# Patient Record
Sex: Female | Born: 1970 | Race: Black or African American | Hispanic: Refuse to answer | Smoking: Former smoker
Health system: Southern US, Community
[De-identification: ages and names within clinical notes are randomized; demographics above are authoritative.]

## PROBLEM LIST (undated history)

## (undated) DIAGNOSIS — R569 Unspecified convulsions: Secondary | ICD-10-CM

## (undated) DIAGNOSIS — D649 Anemia, unspecified: Secondary | ICD-10-CM

## (undated) DIAGNOSIS — D219 Benign neoplasm of connective and other soft tissue, unspecified: Secondary | ICD-10-CM

## (undated) DIAGNOSIS — E785 Hyperlipidemia, unspecified: Secondary | ICD-10-CM

## (undated) DIAGNOSIS — G40909 Epilepsy, unspecified, not intractable, without status epilepticus: Secondary | ICD-10-CM

## (undated) DIAGNOSIS — I639 Cerebral infarction, unspecified: Secondary | ICD-10-CM

## (undated) DIAGNOSIS — Z8619 Personal history of other infectious and parasitic diseases: Secondary | ICD-10-CM

## (undated) DIAGNOSIS — I10 Essential (primary) hypertension: Secondary | ICD-10-CM

## (undated) HISTORY — PX: WISDOM TOOTH EXTRACTION: SHX21

## (undated) HISTORY — DX: Anemia, unspecified: D64.9

## (undated) HISTORY — DX: Unspecified convulsions: R56.9

## (undated) HISTORY — PX: TUBAL LIGATION: SHX77

## (undated) HISTORY — DX: Hyperlipidemia, unspecified: E78.5

## (undated) HISTORY — DX: Personal history of other infectious and parasitic diseases: Z86.19

## (undated) HISTORY — DX: Benign neoplasm of connective and other soft tissue, unspecified: D21.9

## (undated) HISTORY — DX: Epilepsy, unspecified, not intractable, without status epilepticus: G40.909

---

## 1997-11-01 ENCOUNTER — Emergency Department (HOSPITAL_COMMUNITY): Admission: EM | Admit: 1997-11-01 | Discharge: 1997-11-01 | Payer: Self-pay | Admitting: Emergency Medicine

## 1997-11-04 ENCOUNTER — Other Ambulatory Visit: Admission: RE | Admit: 1997-11-04 | Discharge: 1997-11-04 | Payer: Self-pay | Admitting: Family Medicine

## 1997-11-21 ENCOUNTER — Other Ambulatory Visit: Admission: RE | Admit: 1997-11-21 | Discharge: 1997-11-21 | Payer: Self-pay | Admitting: Family Medicine

## 1999-09-08 ENCOUNTER — Other Ambulatory Visit: Admission: RE | Admit: 1999-09-08 | Discharge: 1999-09-08 | Payer: Self-pay | Admitting: *Deleted

## 1999-11-12 ENCOUNTER — Emergency Department (HOSPITAL_COMMUNITY): Admission: EM | Admit: 1999-11-12 | Discharge: 1999-11-12 | Payer: Self-pay | Admitting: *Deleted

## 1999-11-12 ENCOUNTER — Encounter: Payer: Self-pay | Admitting: Emergency Medicine

## 2000-12-11 ENCOUNTER — Encounter: Payer: Self-pay | Admitting: Emergency Medicine

## 2000-12-11 ENCOUNTER — Encounter: Payer: Self-pay | Admitting: Neurology

## 2000-12-11 ENCOUNTER — Inpatient Hospital Stay (HOSPITAL_COMMUNITY): Admission: EM | Admit: 2000-12-11 | Discharge: 2000-12-12 | Payer: Self-pay | Admitting: Emergency Medicine

## 2000-12-12 ENCOUNTER — Encounter: Payer: Self-pay | Admitting: Neurology

## 2001-01-19 ENCOUNTER — Other Ambulatory Visit: Admission: RE | Admit: 2001-01-19 | Discharge: 2001-01-19 | Payer: Self-pay | Admitting: Family Medicine

## 2001-02-20 ENCOUNTER — Ambulatory Visit (HOSPITAL_COMMUNITY): Admission: RE | Admit: 2001-02-20 | Discharge: 2001-02-20 | Payer: Self-pay | Admitting: Family Medicine

## 2001-02-20 ENCOUNTER — Encounter: Payer: Self-pay | Admitting: Family Medicine

## 2001-05-02 ENCOUNTER — Ambulatory Visit (HOSPITAL_COMMUNITY): Admission: RE | Admit: 2001-05-02 | Discharge: 2001-05-02 | Payer: Self-pay | Admitting: Family Medicine

## 2002-12-18 ENCOUNTER — Other Ambulatory Visit: Admission: RE | Admit: 2002-12-18 | Discharge: 2002-12-18 | Payer: Self-pay | Admitting: Obstetrics & Gynecology

## 2003-02-16 ENCOUNTER — Emergency Department (HOSPITAL_COMMUNITY): Admission: EM | Admit: 2003-02-16 | Discharge: 2003-02-16 | Payer: Self-pay | Admitting: Emergency Medicine

## 2003-07-26 ENCOUNTER — Emergency Department (HOSPITAL_COMMUNITY): Admission: EM | Admit: 2003-07-26 | Discharge: 2003-07-26 | Payer: Self-pay | Admitting: Emergency Medicine

## 2003-11-20 ENCOUNTER — Emergency Department (HOSPITAL_COMMUNITY): Admission: EM | Admit: 2003-11-20 | Discharge: 2003-11-20 | Payer: Self-pay | Admitting: Family Medicine

## 2004-01-15 ENCOUNTER — Emergency Department (HOSPITAL_COMMUNITY): Admission: EM | Admit: 2004-01-15 | Discharge: 2004-01-15 | Payer: Self-pay | Admitting: Family Medicine

## 2004-04-10 ENCOUNTER — Emergency Department (HOSPITAL_COMMUNITY): Admission: EM | Admit: 2004-04-10 | Discharge: 2004-04-10 | Payer: Self-pay | Admitting: Family Medicine

## 2004-07-13 ENCOUNTER — Emergency Department (HOSPITAL_COMMUNITY): Admission: EM | Admit: 2004-07-13 | Discharge: 2004-07-14 | Payer: Self-pay | Admitting: Emergency Medicine

## 2004-07-15 ENCOUNTER — Emergency Department (HOSPITAL_COMMUNITY): Admission: EM | Admit: 2004-07-15 | Discharge: 2004-07-15 | Payer: Self-pay | Admitting: Emergency Medicine

## 2004-10-29 ENCOUNTER — Emergency Department (HOSPITAL_COMMUNITY): Admission: EM | Admit: 2004-10-29 | Discharge: 2004-10-29 | Payer: Self-pay | Admitting: Family Medicine

## 2004-11-01 ENCOUNTER — Emergency Department (HOSPITAL_COMMUNITY): Admission: EM | Admit: 2004-11-01 | Discharge: 2004-11-01 | Payer: Self-pay | Admitting: Family Medicine

## 2005-02-26 ENCOUNTER — Emergency Department (HOSPITAL_COMMUNITY): Admission: EM | Admit: 2005-02-26 | Discharge: 2005-02-26 | Payer: Self-pay | Admitting: Family Medicine

## 2005-08-08 ENCOUNTER — Emergency Department (HOSPITAL_COMMUNITY): Admission: EM | Admit: 2005-08-08 | Discharge: 2005-08-08 | Payer: Self-pay | Admitting: Family Medicine

## 2005-10-05 ENCOUNTER — Emergency Department (HOSPITAL_COMMUNITY): Admission: EM | Admit: 2005-10-05 | Discharge: 2005-10-05 | Payer: Self-pay | Admitting: Family Medicine

## 2006-03-01 ENCOUNTER — Emergency Department (HOSPITAL_COMMUNITY): Admission: EM | Admit: 2006-03-01 | Discharge: 2006-03-01 | Payer: Self-pay | Admitting: Emergency Medicine

## 2006-07-13 ENCOUNTER — Emergency Department (HOSPITAL_COMMUNITY): Admission: EM | Admit: 2006-07-13 | Discharge: 2006-07-13 | Payer: Self-pay | Admitting: Emergency Medicine

## 2006-11-01 ENCOUNTER — Emergency Department (HOSPITAL_COMMUNITY): Admission: EM | Admit: 2006-11-01 | Discharge: 2006-11-01 | Payer: Self-pay | Admitting: Family Medicine

## 2006-11-03 ENCOUNTER — Emergency Department (HOSPITAL_COMMUNITY): Admission: EM | Admit: 2006-11-03 | Discharge: 2006-11-03 | Payer: Self-pay | Admitting: Family Medicine

## 2007-01-17 ENCOUNTER — Emergency Department (HOSPITAL_COMMUNITY): Admission: EM | Admit: 2007-01-17 | Discharge: 2007-01-17 | Payer: Self-pay | Admitting: Emergency Medicine

## 2007-02-09 ENCOUNTER — Emergency Department (HOSPITAL_COMMUNITY): Admission: EM | Admit: 2007-02-09 | Discharge: 2007-02-09 | Payer: Self-pay | Admitting: Family Medicine

## 2008-01-22 ENCOUNTER — Inpatient Hospital Stay (HOSPITAL_COMMUNITY): Admission: EM | Admit: 2008-01-22 | Discharge: 2008-01-24 | Payer: Self-pay | Admitting: Emergency Medicine

## 2009-07-16 ENCOUNTER — Emergency Department (HOSPITAL_COMMUNITY): Admission: EM | Admit: 2009-07-16 | Discharge: 2009-07-16 | Payer: Self-pay | Admitting: Emergency Medicine

## 2010-03-18 ENCOUNTER — Emergency Department (HOSPITAL_COMMUNITY)
Admission: EM | Admit: 2010-03-18 | Discharge: 2010-03-18 | Payer: Self-pay | Source: Home / Self Care | Admitting: Emergency Medicine

## 2010-05-27 ENCOUNTER — Ambulatory Visit (HOSPITAL_COMMUNITY)
Admission: RE | Admit: 2010-05-27 | Discharge: 2010-05-27 | Payer: Self-pay | Source: Home / Self Care | Attending: Internal Medicine | Admitting: Internal Medicine

## 2010-07-09 ENCOUNTER — Encounter (HOSPITAL_COMMUNITY): Admission: RE | Admit: 2010-07-09 | Payer: Self-pay | Source: Home / Self Care | Admitting: Internal Medicine

## 2010-07-15 ENCOUNTER — Encounter: Payer: Self-pay | Admitting: Internal Medicine

## 2010-10-30 NOTE — H&P (Signed)
[place]. [facility]  Patient:    Tina Moss, Tina Moss                       MRN: [id number] Adm. Date:  [id number] Attending:  Molpus, John L                         History and Physical  DATE OF BIRTH:  1971-06-08  CHIEF COMPLAINT:  Altered level of consciousness.  HISTORY OF PRESENT ILLNESS:  This is the first [facility] system admission for this 40 year old woman with a reported past medical history of seizures who was brought to the emergency room with a decreased level of consciousness.  There is no history available except what is per the chart. The patient was apparently drinking fairly heavily tonight and has been under increased emotional stress lately.  She laid down on the couch, and then per familys report to EMS became less responsive rather suddenly.  There was no clear seizure activity per the family.  EMS notes the patient was arousable somewhat to ammonia capsule.  She was brought to the emergency room where she was noted to have disconjugate gaze.  Presently, the patient is minimally responsive but is able to answer simple questions.  She denies any problems.  PAST MEDICAL HISTORY: 1. Remarkable for questionable seizures as above.  She apparently is on    Dilantin but does not really take it. 2. No other known medical history.  FAMILY HISTORY:  Unknown.  SOCIAL HISTORY:  Alcohol consumption as above; it is unclear how much her chronic use is.  ALLERGIES:  None known.  MEDICATIONS:  Possibly Dilantin.  REVIEW OF SYSTEMS:  Unavailable secondary to the patients mental status. She reportedly complained of a headache earlier.  PHYSICAL EXAMINATION: VITAL SIGNS:  Temperature 97.1, blood pressure 112/64, pulse 87, respirations 24, O2 saturation 99% on two liters of oxygen by nasal cannula.  GENERAL/MENTAL STATUS:  She is obtunded and briefly arouses to vigorous, verbal and painful stimuli and then quickly falls asleep again.   She is oriented to a hospital in [place].  She identifies the year as 2002 but cannot name the month.  She is able to follow simple commands.  HEAD:  Cranium is normocephalic and atraumatic.  Oropharynx is benign.  There is no tongue trauma.  NECK:  Supple without bruit.  HEART:  Regular rate and rhythm without murmurs.  CHEST:  Clear to auscultation.  ABDOMEN:  Soft with normoactive bowel sounds.  EXTREMITIES:  No edema.  NEUROLOGICAL:  Pupils are equal and briskly reactive.  Her eyes are disconjugate and she appears to have a left exotropia.  It is hard to examine her extraocular movements due to poor cooperativity.  She has corneal and gag reflexes and the face is symmetric.  Sensory/tone:  Tone is normal.  She is able to withdraw from pain in all extremities.  Reflexes are slightly brisk on the left compared to the right.  Toes are downgoing.  LABORATORY DATA:  CBC and iSTAT are normal.  Urine drug screen is negative. Urinalysis reveals a UTI.  Alcohol level is 201.  A CT of the head is personally reviewed and reveals scattered hypodensities bilaterally in the deep white matter.  This looked mostly like MS lesions, old strokes or another possibility less likely are abscesses and metastatic.  IMPRESSION: 1. Altered level of consciousness probably due to alcohol intoxication.  Rule  out postictal state or other phenomenon. 2. Alcohol intoxication. 3. Abnormal CAT scan, etiology unclear. 4. Urinary tract infection.  PLAN: 1. Admit for observation. 2. MRI with contrast. 3. IV fluids and supportive care. 4. Treatment for UTI. 5. May need to check EEG. DD:  12/11/00 TD:  12/11/00 Job: [id number]

## 2011-03-12 LAB — CBC
HCT: 31.6 — ABNORMAL LOW
MCHC: 31.9
MCV: 80.1
Platelets: 238
RBC: 3.95
RDW: 20.1 — ABNORMAL HIGH

## 2011-03-12 LAB — DIFFERENTIAL
Basophils Absolute: 0
Lymphocytes Relative: 12
Lymphs Abs: 1
Neutro Abs: 6.2

## 2011-03-12 LAB — BASIC METABOLIC PANEL
BUN: 9
CO2: 26
Calcium: 8.7
Creatinine, Ser: 0.52
GFR calc Af Amer: >60
GFR calc non Af Amer: >60
Glucose, Bld: 93

## 2011-03-12 LAB — URINALYSIS, ROUTINE W REFLEX MICROSCOPIC
Bilirubin Urine: NEGATIVE
Glucose, UA: NEGATIVE
Hgb urine dipstick: NEGATIVE
Nitrite: NEGATIVE
Protein, ur: NEGATIVE
Specific Gravity, Urine: 1.029

## 2011-03-12 LAB — WET PREP, GENITAL
Clue Cells Wet Prep HPF POC: NONE SEEN
Trich, Wet Prep: NONE SEEN

## 2011-03-29 LAB — I-STAT 8, (EC8 V) (CONVERTED LAB)
Bicarbonate: 24.1 — ABNORMAL HIGH
Glucose, Bld: 90
Hemoglobin: 12.2
Operator id: 116391
Potassium: 3.6
Sodium: 140
TCO2: 25
pCO2, Ven: 46.5
pH, Ven: 7.322 — ABNORMAL HIGH

## 2011-03-29 LAB — POCT URINALYSIS DIP (DEVICE)
Bilirubin Urine: NEGATIVE
Glucose, UA: NEGATIVE
Ketones, ur: NEGATIVE
Nitrite: NEGATIVE
Operator id: 116391
Specific Gravity, Urine: 1.025
Urobilinogen, UA: 0.2
pH: 6

## 2011-03-29 LAB — WET PREP, GENITAL
Clue Cells Wet Prep HPF POC: NONE SEEN
Trich, Wet Prep: NONE SEEN
Yeast Wet Prep HPF POC: NONE SEEN

## 2011-03-29 LAB — POCT PREGNANCY, URINE
Operator id: 235561
Preg Test, Ur: NEGATIVE

## 2011-03-29 LAB — POCT I-STAT CREATININE
Creatinine, Ser: 0.6
Operator id: 116391

## 2011-03-29 LAB — GC/CHLAMYDIA PROBE AMP, GENITAL
Chlamydia, DNA Probe: NEGATIVE
GC Probe Amp, Genital: NEGATIVE

## 2011-06-21 ENCOUNTER — Ambulatory Visit: Payer: 59

## 2011-06-21 DIAGNOSIS — J069 Acute upper respiratory infection, unspecified: Secondary | ICD-10-CM

## 2011-06-21 DIAGNOSIS — D649 Anemia, unspecified: Secondary | ICD-10-CM

## 2011-06-21 DIAGNOSIS — R1084 Generalized abdominal pain: Secondary | ICD-10-CM

## 2011-11-04 ENCOUNTER — Ambulatory Visit (INDEPENDENT_AMBULATORY_CARE_PROVIDER_SITE_OTHER): Payer: 59 | Admitting: Family Medicine

## 2011-11-04 VITALS — BP 120/80 | HR 92 | Temp 97.9°F | Resp 16 | Ht 68.0 in | Wt 175.0 lb

## 2011-11-04 DIAGNOSIS — Q043 Other reduction deformities of brain: Secondary | ICD-10-CM

## 2011-11-04 DIAGNOSIS — M545 Low back pain: Secondary | ICD-10-CM

## 2011-11-04 DIAGNOSIS — R350 Frequency of micturition: Secondary | ICD-10-CM

## 2011-11-04 DIAGNOSIS — R300 Dysuria: Secondary | ICD-10-CM

## 2011-11-04 LAB — POCT UA - MICROSCOPIC ONLY: Casts, Ur, LPF, POC: NEGATIVE

## 2011-11-04 LAB — POCT URINALYSIS DIPSTICK
Blood, UA: NEGATIVE
Glucose, UA: NEGATIVE
Ketones, UA: 15
Nitrite, UA: NEGATIVE

## 2011-11-04 MED ORDER — MELOXICAM 7.5 MG PO TABS: ORAL_TABLET | ORAL | Status: DC

## 2011-11-04 MED ORDER — CYCLOBENZAPRINE HCL 10 MG PO TABS: 10.0000 mg | ORAL_TABLET | Freq: Every evening | ORAL | Status: AC | PRN

## 2011-11-04 NOTE — Progress Notes (Signed)
  Subjective:    Patient ID: Tina Moss, female    DOB: [birth year], 41 y.o.   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 Pain with ROM. Denies radiation of pain to LE Denies weakness to LE Denies loss of bowel or bladder control.  Tobacco 2 packs a week.  Review of Systems     Objective:   Physical Exam  Constitutional: She appears well-developed.  Neck: Neck supple.  Cardiovascular: Normal rate, regular rhythm and normal heart sounds.   Pulmonary/Chest: Effort normal and breath sounds normal.  Musculoskeletal:       Lumbar back: She exhibits tenderness and spasm. She exhibits normal range of motion, no bony tenderness, no deformity and no pain.       Back:  Neurological: She is alert. She has normal strength. No sensory deficit.  Reflex Scores:      Patellar reflexes are 2+ on the right side and 2+ on the left side.      Negative SLR        Assessment & Plan:  LBP; musculoskeletal  See medications on AVS Back booklet provided Work restrictions provided(see scanned return to work note) Work excuse provided for today. Patient encouraged to speak with her supervisor regarding this injury

## 2012-01-11 ENCOUNTER — Encounter: Payer: Self-pay | Admitting: Obstetrics and Gynecology

## 2012-01-18 ENCOUNTER — Encounter: Payer: Self-pay | Admitting: Obstetrics and Gynecology

## 2012-01-18 ENCOUNTER — Ambulatory Visit (INDEPENDENT_AMBULATORY_CARE_PROVIDER_SITE_OTHER): Payer: 59 | Admitting: Obstetrics and Gynecology

## 2012-01-18 VITALS — BP 122/64 | Ht 67.0 in | Wt 169.0 lb

## 2012-01-18 DIAGNOSIS — G40909 Epilepsy, unspecified, not intractable, without status epilepticus: Secondary | ICD-10-CM

## 2012-01-18 DIAGNOSIS — N946 Dysmenorrhea, unspecified: Secondary | ICD-10-CM

## 2012-01-18 DIAGNOSIS — D219 Benign neoplasm of connective and other soft tissue, unspecified: Secondary | ICD-10-CM | POA: Insufficient documentation

## 2012-01-18 DIAGNOSIS — D649 Anemia, unspecified: Secondary | ICD-10-CM

## 2012-01-18 DIAGNOSIS — N920 Excessive and frequent menstruation with regular cycle: Secondary | ICD-10-CM

## 2012-01-18 DIAGNOSIS — D259 Leiomyoma of uterus, unspecified: Secondary | ICD-10-CM

## 2012-01-18 DIAGNOSIS — R569 Unspecified convulsions: Secondary | ICD-10-CM

## 2012-01-18 NOTE — Progress Notes (Signed)
When did bleeding start: 12/20/2011 How  Long: 5-7 days and very heavy  How often changing pad/tampon: 6 times in a work day 5am to 2 pm Bleeding Disorders: yes Cramping: yes Contraception: no Fibroids: yes history  Hormone Therapy: no New Medications: no Menopausal Symptoms: yes Vag. Discharge: yes Abdominal Pain: yes Increased Stress: yes Pt stated had her pap last year in 2012 wnl   NEW GYNECOLOGIC EXAMINATION  Tina Moss is an 41 y.o. female, [id number], who presents to the [facility] division of [facility] for a new patient gynecologic examination. The patient has a history of fibroids, menorrhagia, dysmenorrhea,and anemia.  At time she has difficulty working. She has had a tubal ligation.  She has had 2 cesarean sections.  Her complains are:she does feel fatigued.  She has romance concerns. .     Pertinent Gynecological History: Patient's last menstrual period was 12/20/2011. Menses: heavy with cramping. Menarche: 10 Contraception: tubal ligation DES exposure: unknown Blood transfusions: none Sexually transmitted diseases: The patient denies history of sexually transmitted disease. Previous GYN Procedures: cesarean section and tubal ligation  Last pap: normal Date: 2012 History of Abnormal Pap Smears:  No  Obstetrical History:  Vaginal Deliveries at Term:      0 Preterm Vaginal Deliveries:      0 Cesarean Deliveries at Term:  2 Preterm Cesareans:                 0 Miscarriages:                            1 Abortions:                                  0    Past Medical History  Diagnosis Date  . Epilepsy   . H/O blood clots   . Seizures   . H/O mumps   . H/O bacterial infection   . Anemia   . Fibroids    The patient reports that she had seizures as a child.  Her last seizure was 4 years ago.  She is not taking medication.  She does not have a physician.  Past Surgical History  Procedure Date  . Wisdom tooth extraction     . Cesarean section   . Btl     Family history:   Social History:  reports that she has been smoking Cigars.  She has never used smokeless tobacco. She reports that she drinks alcohol. She reports that she does not use illicit drugs.  Allergies: No Known Allergies  Medications: none  Review of Systems:  See history of present illness and gynecologic history.  Family History:   The patient has a family history of high blood pressure and headaches  Physical Examination:  Blood pressure 122/64, height 5\' 7"  (1.702 m), weight 169 lb (76.658 kg), last menstrual period 12/20/2011. Body mass index is 26.47 kg/(m^2).  General: alert, cooperative, no distress and Poor dentition. Resp: clear to auscultation bilaterally Cardio: regular rate and rhythm, S1, S2 normal, no murmur, click, rub or gallop GI: soft and nontender normal appearance, no masses or tenderness  External genitalia: normal general appearance Vaginal: normal without tenderness, induration or masses and relaxation noted Cervix: normal appearance Adnexa: normal bimanual exam Uterus: upper limits normal size, irregular, firm  Assessment:  Dysmenorrhea Fibroids Menorrhagia Seizure disorder Overweight Poor dentition Romance  issues  Pelvic relaxation: Yes   Plan:    mammogram return annually or prn Contraception:bilateral tubal ligation    STD screen request: Yes, gonorrhea and Chlamydia sent.  Annual mammograms recommended starting at age 81. Proper breast care was discussed.  Screening colonoscopy is recommended beginning at age 106.  Regular health maintenance was reviewed.  GYN ultrasound.  Return to office in 2 weeks.  Management of menorrhagia and dysmenorrhea was discussed.  Medication and surgery were reviewed.  Risk and benefits were outlined.  Romance issues were discussed with the patient.  We will continue to discussion after we have managed the above problems.  CBC, prolactin, TSH  sent.  The patient left FMLA papers with [place] to complete. We will document that the patient says that she has the above-mentioned problems.  We will document that these problems recur on a monthly basis.  We will document that we approve her being allowed to be out of work during her menstrual cycle.  We will not be able to specifically document the days that she will be out of work because we do not specifically know when her cycle will begin or end.  Leonard Schwartz, M.D. 01/18/2012

## 2012-01-19 LAB — CBC
MCH: 21.4 pg — ABNORMAL LOW (ref 26.0–34.0)
MCHC: 28.5 g/dL — ABNORMAL LOW (ref 30.0–36.0)
Platelets: 350 10*3/uL (ref 150–400)
RDW: 19.8 % — ABNORMAL HIGH (ref 11.5–15.5)

## 2012-01-19 LAB — TSH: TSH: 0.691 u[IU]/mL (ref 0.350–4.500)

## 2012-01-19 LAB — GC/CHLAMYDIA PROBE AMP, GENITAL: Chlamydia, DNA Probe: NEGATIVE

## 2012-02-08 ENCOUNTER — Ambulatory Visit (INDEPENDENT_AMBULATORY_CARE_PROVIDER_SITE_OTHER): Payer: 59 | Admitting: Obstetrics and Gynecology

## 2012-02-08 ENCOUNTER — Ambulatory Visit (INDEPENDENT_AMBULATORY_CARE_PROVIDER_SITE_OTHER): Payer: 59

## 2012-02-08 ENCOUNTER — Encounter: Payer: Self-pay | Admitting: Obstetrics and Gynecology

## 2012-02-08 ENCOUNTER — Other Ambulatory Visit: Payer: Self-pay | Admitting: Obstetrics and Gynecology

## 2012-02-08 VITALS — BP 130/82 | Resp 16 | Ht 67.0 in | Wt 163.0 lb

## 2012-02-08 DIAGNOSIS — N946 Dysmenorrhea, unspecified: Secondary | ICD-10-CM

## 2012-02-08 DIAGNOSIS — D219 Benign neoplasm of connective and other soft tissue, unspecified: Secondary | ICD-10-CM

## 2012-02-08 DIAGNOSIS — N926 Irregular menstruation, unspecified: Secondary | ICD-10-CM

## 2012-02-08 DIAGNOSIS — D649 Anemia, unspecified: Secondary | ICD-10-CM

## 2012-02-08 DIAGNOSIS — N920 Excessive and frequent menstruation with regular cycle: Secondary | ICD-10-CM

## 2012-02-08 DIAGNOSIS — D259 Leiomyoma of uterus, unspecified: Secondary | ICD-10-CM

## 2012-02-08 NOTE — Progress Notes (Signed)
HISTORY OF PRESENT ILLNESS  [name] is a 41 y.o. year old female,G3P2012, who presents for a problem visit. She complains of anemia, fatigue, menorrhagia, and fibroids.  CBC    Component Value Date/Time   WBC 5.7 01/18/2012 1512   RBC 4.26 01/18/2012 1512   HGB 9.1* 01/18/2012 1512   HCT 31.9* 01/18/2012 1512   PLT 350 01/18/2012 1512   MCV 74.9* 01/18/2012 1512   MCH 21.4* 01/18/2012 1512   MCHC 28.5* 01/18/2012 1512   RDW 19.8* 01/18/2012 1512   LYMPHSABS 1.0 01/22/2008 1015   MONOABS 0.6 01/22/2008 1015   EOSABS 0.0 01/22/2008 1015   BASOSABS 0.0 01/22/2008 1015   TSH: 0.96.  Prolactin: 9.6.  (Both normal)  Gonorrhea negative.  Chlamydia negative.  Subjective:  Complains of dyspareunia.  She does not want more children.  She has had a tubal ligation.  Objective:  BP 130/82  Resp 16  Ht 5\' 7"  (1.702 m)  Wt 163 lb (73.936 kg)  BMI 25.53 kg/m2  LMP 01/19/2012   GI: soft and nontender  External genitalia: normal general appearance Vaginal: normal without tenderness, induration or masses and relaxation noted Cervix: normal appearance Adnexa: normal bimanual exam Uterus: 8 week size, irregular, slightly tender   Hydrosonogram:  Indications for the procedure were reviewed.  A permit has been signed.  Questions were answered. An ultrasound was performed.  The uterus measured 8.7 cm x 5.9 cm.  The endometrial thickness was 10.4 mm.  The ovaries normal.  The vagina and cervix were prepped with Betadine.  Hurricaine gel was placed on the cervix.  The uterus sounded to 9 cm.  Endometrial biopsy was obtained. The hydrosonogram catheter was placed inside the uterus.  25 cc of sterile saline were injected.  A 3-D ultrasound was performed. Findings include: an intramural fibroid that measured 3.1 x 3.1 cm.  No endometrial lesions were seen..  The patient tolerated her procedure well.  All instruments were removed.  The patient was returned to the supine  position.  Assessment:  Menorrhagia. Anemia. Fibroids. Dyspareunia.  Plan:  Endometrial biopsy to pathology. Management options were reviewed.  The patient seems most interested in either Mirena IUD, hormone therapy, or hysterectomy.  The risks and benefits were outlined.  The patient will call with her decision.  Return to office prn if symptoms worsen or fail to improve.   Leonard Schwartz M.D.  02/08/2012 6:49 PM    When did bleeding start: Heavy bleeding only with cycles How  Long: 5-8 days  How often changing pad/tampon: uses super pads 3-4 in a day about 3 of medium flow pads Bleeding Disorders: no Cramping: yes Contraception: no BTL Fibroids: yes Hormone Therapy: no New Medications: no Menopausal Symptoms: yes Hot Flashes  Vag. Discharge: no Abdominal Pain: no Increased Stress: yes pt states she is very stressed out over a lot of things.

## 2012-09-27 ENCOUNTER — Ambulatory Visit: Payer: 59 | Admitting: Family Medicine

## 2012-09-27 VITALS — BP 142/82 | HR 88 | Temp 98.3°F | Resp 18 | Ht 68.0 in | Wt 160.0 lb

## 2012-09-27 DIAGNOSIS — R05 Cough: Secondary | ICD-10-CM

## 2012-09-27 DIAGNOSIS — J209 Acute bronchitis, unspecified: Secondary | ICD-10-CM

## 2012-09-27 DIAGNOSIS — R059 Cough, unspecified: Secondary | ICD-10-CM

## 2012-09-27 MED ORDER — HYDROCODONE-HOMATROPINE 5-1.5 MG/5ML PO SYRP: 5.0000 mL | ORAL_SOLUTION | Freq: Three times a day (TID) | ORAL | Status: DC | PRN

## 2012-09-27 MED ORDER — BENZONATATE 100 MG PO CAPS: 100.0000 mg | ORAL_CAPSULE | Freq: Three times a day (TID) | ORAL | Status: DC | PRN

## 2012-09-27 MED ORDER — DOXYCYCLINE HYCLATE 100 MG PO TABS: 100.0000 mg | ORAL_TABLET | Freq: Two times a day (BID) | ORAL | Status: DC

## 2012-09-27 NOTE — Patient Instructions (Addendum)
Use the antibiotic as directed.  The tessalon perles are for cough and will not cause drowsiness.  The hycodan syrup is also for cough, but can cause drowsiness so do not use it when you need to drive.   Let us know if you are not better in the next few days.

## 2012-09-27 NOTE — Progress Notes (Signed)
  Subjective:    Patient ID: Tina Moss, female    DOB: 1970-08-13, 42 y.o.   MRN: [id number]  HPI 42 yo female with complaints of cough, chest congestion, lost appetite, and body aches. Has had blood tinged mucus from nose. Started 3 days ago. Multiple sick contacts as she stayed overnight with mother in hospital for GI bug. Denies nausea, vomiting, or diarrhea. Denies fever, had chills/sweating the first night.  No flu shot this year.  She is no longer on her anti- seizure medications. Last seizure 4 or 5 years ago.  She has been off her seizure meds for about one year.  Review of Systems  Constitutional: Positive for chills, appetite change and fatigue. Negative for fever.  HENT: Positive for nosebleeds, congestion and neck pain (diffuse pain down into shoulders and back).        Sinus pain  Eyes:       "sees stars" when she first wakes up  Respiratory: Positive for cough, chest tightness and shortness of breath. Wheezing: worse when laying down.   Cardiovascular: Negative.   Gastrointestinal: Negative for nausea, vomiting, abdominal pain, diarrhea and constipation.  Musculoskeletal: Positive for myalgias and arthralgias.  Skin: Negative for rash.  Neurological: Negative for dizziness and light-headedness.       Objective:   Physical Exam  Constitutional: She is oriented to person, place, and time. She appears well-developed and well-nourished.  HENT:  Head: Normocephalic and atraumatic.  Right Ear: External ear normal.  Left Ear: External ear normal.  Neck: Normal range of motion. Neck supple.  Cardiovascular: Normal rate, regular rhythm and normal heart sounds.   Pulmonary/Chest: Effort normal. She has wheezes (diffuse, bilateral).  Abdominal: Soft. Bowel sounds are normal.  Neurological: She is alert and oriented to person, place, and time.  Skin: Skin is warm and dry.  Diffuse tenderness and tightness in bilateral trapezius muscles. No meningismus. Looks well  Minimal  wheezing per my exam- -JC      Assessment & Plan:  42 yo with 4 days of cough, congestion, and wheezing. Likely bronchitis. Begin doxy. Hycodan for cough as well as tessalon perles. Advised that hycodan can cause drowsiness. Given note for work. Please call or return if symptoms worsen in the next few days.   History of seizure disorder currently off of medication.  States she has used hydrocodone in the past without any ill effects.  S/p BTL and LMP less than one month ago so no pregnancy concern.

## 2013-09-25 ENCOUNTER — Emergency Department (HOSPITAL_COMMUNITY): Payer: 59

## 2013-09-25 ENCOUNTER — Emergency Department (HOSPITAL_COMMUNITY)
Admission: EM | Admit: 2013-09-25 | Discharge: 2013-09-25 | Disposition: A | Discharge status: Home / Self Care | Payer: 59 | Attending: Emergency Medicine | Admitting: Emergency Medicine

## 2013-09-25 ENCOUNTER — Encounter (HOSPITAL_COMMUNITY): Payer: Self-pay | Admitting: Emergency Medicine

## 2013-09-25 DIAGNOSIS — Z8669 Personal history of other diseases of the nervous system and sense organs: Secondary | ICD-10-CM | POA: Insufficient documentation

## 2013-09-25 DIAGNOSIS — N938 Other specified abnormal uterine and vaginal bleeding: Secondary | ICD-10-CM | POA: Insufficient documentation

## 2013-09-25 DIAGNOSIS — N925 Other specified irregular menstruation: Secondary | ICD-10-CM | POA: Insufficient documentation

## 2013-09-25 DIAGNOSIS — Z3202 Encounter for pregnancy test, result negative: Secondary | ICD-10-CM | POA: Insufficient documentation

## 2013-09-25 DIAGNOSIS — Z862 Personal history of diseases of the blood and blood-forming organs and certain disorders involving the immune mechanism: Secondary | ICD-10-CM | POA: Insufficient documentation

## 2013-09-25 DIAGNOSIS — N949 Unspecified condition associated with female genital organs and menstrual cycle: Secondary | ICD-10-CM | POA: Insufficient documentation

## 2013-09-25 DIAGNOSIS — Z8619 Personal history of other infectious and parasitic diseases: Secondary | ICD-10-CM | POA: Insufficient documentation

## 2013-09-25 DIAGNOSIS — Z791 Long term (current) use of non-steroidal anti-inflammatories (NSAID): Secondary | ICD-10-CM | POA: Insufficient documentation

## 2013-09-25 DIAGNOSIS — N898 Other specified noninflammatory disorders of vagina: Secondary | ICD-10-CM | POA: Insufficient documentation

## 2013-09-25 DIAGNOSIS — Z792 Long term (current) use of antibiotics: Secondary | ICD-10-CM | POA: Insufficient documentation

## 2013-09-25 DIAGNOSIS — Z86718 Personal history of other venous thrombosis and embolism: Secondary | ICD-10-CM | POA: Insufficient documentation

## 2013-09-25 DIAGNOSIS — R102 Pelvic and perineal pain: Secondary | ICD-10-CM

## 2013-09-25 DIAGNOSIS — F17200 Nicotine dependence, unspecified, uncomplicated: Secondary | ICD-10-CM | POA: Insufficient documentation

## 2013-09-25 LAB — URINALYSIS, ROUTINE W REFLEX MICROSCOPIC
Bilirubin Urine: NEGATIVE
GLUCOSE, UA: NEGATIVE mg/dL
Hgb urine dipstick: NEGATIVE
Ketones, ur: NEGATIVE mg/dL
LEUKOCYTES UA: NEGATIVE
NITRITE: NEGATIVE
PH: 7 (ref 5.0–8.0)
Protein, ur: NEGATIVE mg/dL
Specific Gravity, Urine: 1.013 (ref 1.005–1.030)
Urobilinogen, UA: 0.2 mg/dL (ref 0.0–1.0)

## 2013-09-25 LAB — POC URINE PREG, ED: PREG TEST UR: NEGATIVE

## 2013-09-25 LAB — WET PREP, GENITAL
Clue Cells Wet Prep HPF POC: NONE SEEN
Trich, Wet Prep: NONE SEEN
Yeast Wet Prep HPF POC: NONE SEEN

## 2013-09-25 MED ORDER — OXYCODONE-ACETAMINOPHEN 5-325 MG PO TABS
2.0000 | ORAL_TABLET | Freq: Once | ORAL | Status: AC
  Administered 2013-09-25: 2 via ORAL
  Filled 2013-09-25: qty 2

## 2013-09-25 MED ORDER — ONDANSETRON HCL 4 MG PO TABS: 4.0000 mg | ORAL_TABLET | Freq: Four times a day (QID) | ORAL | Status: DC

## 2013-09-25 MED ORDER — ONDANSETRON 8 MG PO TBDP
8.0000 mg | ORAL_TABLET | Freq: Once | ORAL | Status: AC
  Administered 2013-09-25: 8 mg via ORAL
  Filled 2013-09-25: qty 1

## 2013-09-25 MED ORDER — OXYCODONE-ACETAMINOPHEN 5-325 MG PO TABS: 2.0000 | ORAL_TABLET | Freq: Four times a day (QID) | ORAL | Status: DC | PRN

## 2013-09-25 NOTE — ED Provider Notes (Signed)
CSN: [id number]     Arrival date & time 09/25/13  6314 History   First MD Initiated Contact with Patient 09/25/13 [phone]     Chief Complaint  Patient presents with  . Abdominal Pain     (Consider location/radiation/quality/duration/timing/severity/associated sxs/prior Treatment) HPI Comments: Patient is a 43 year old G3P2 female with history of epilepsy, seizures, and fibroids who presents today with abdominal pain. She states that for the past 2 weeks she has been on her menstrual cycle which is getting lighter and is more "watery". She developed a needle like sensation in her left lower quadrant yesterday which started around 10PM and resolved spontaneously at 1AM. Sitting up makes the pain feel better. She has not taken any medications to improve her symptoms. She denies nausea, vomiting, diarrhea, fevers, chills, shortness of breath, chest pain.   The history is provided by the patient. No language interpreter was used.    Past Medical History  Diagnosis Date  . Epilepsy   . H/O blood clots   . Seizures   . H/O mumps   . H/O bacterial infection   . Anemia   . Fibroids    Past Surgical History  Procedure Laterality Date  . Wisdom tooth extraction    . Cesarean section    . Btl     No family history on file. History  Substance Use Topics  . Smoking status: Current Every Day Smoker    Types: Cigars  . Smokeless tobacco: Never Used  . Alcohol Use: Yes     Comment: social   OB History   Grav Para Term Preterm Abortions TAB SAB Ect Mult Living   3 2 2  1  1   2      Review of Systems  Constitutional: Negative for fever and chills.  Respiratory: Negative for shortness of breath.   Cardiovascular: Negative for chest pain.  Gastrointestinal: Negative for nausea, vomiting, abdominal pain, diarrhea and constipation.  Genitourinary: Positive for vaginal bleeding, menstrual problem and pelvic pain. Negative for dysuria, frequency and vaginal discharge.  All other systems  reviewed and are negative.     Allergies  Review of patient's allergies indicates no known allergies.  Home Medications   Prior to Admission medications   Medication Sig Start Date End Date Taking? Authorizing Provider  benzonatate (TESSALON) 100 MG capsule Take 1 capsule (100 mg total) by mouth 3 (three) times daily as needed for cough. 09/27/12   Gay Filler Copland, MD  doxycycline (VIBRA-TABS) 100 MG tablet Take 1 tablet (100 mg total) by mouth 2 (two) times daily. 09/27/12   Gay Filler Copland, MD  HYDROcodone-homatropine (HYCODAN) 5-1.5 MG/5ML syrup Take 5 mLs by mouth every 8 (eight) hours as needed for cough. 09/27/12   Darreld Mclean, MD  meloxicam (MOBIC) 7.5 MG tablet One po BID PRN 11/04/11   Hayden Rasmussen, MD   BP 123/76  Pulse 94  Temp(Src) 98 F (36.7 C) (Oral)  Resp 16  SpO2 100%  LMP 09/11/2013 Physical Exam  Nursing note and vitals reviewed. Constitutional: She is oriented to person, place, and time. She appears well-developed and well-nourished. No distress.  HENT:  Head: Normocephalic and atraumatic.  Right Ear: External ear normal.  Left Ear: External ear normal.  Nose: Nose normal.  Mouth/Throat: Oropharynx is clear and moist.  Eyes: Conjunctivae are normal.  Neck: Normal range of motion.  Cardiovascular: Normal rate, regular rhythm and normal heart sounds.   Pulmonary/Chest: Effort normal and breath sounds normal. No  stridor. No respiratory distress. She has no wheezes. She has no rales.  Abdominal: Soft. She exhibits no distension. There is tenderness in the left lower quadrant.    Genitourinary: There is no rash, tenderness, lesion or injury on the right labia. There is no rash, tenderness, lesion or injury on the left labia. Cervix exhibits no motion tenderness, no discharge and no friability. Right adnexum displays no mass, no tenderness and no fullness. Left adnexum displays tenderness. Left adnexum displays no mass and no fullness. No erythema,  tenderness or bleeding around the vagina. No foreign body around the vagina. No signs of injury around the vagina. No vaginal discharge found.  No discharge seen on pelvic exam, no CMT or cervical friability.   Musculoskeletal: Normal range of motion.  Neurological: She is alert and oriented to person, place, and time. She has normal strength.  Skin: Skin is warm and dry. She is not diaphoretic. No erythema.  Psychiatric: She has a normal mood and affect. Her behavior is normal.    ED Course  Procedures (including critical care time) Labs Review Labs Reviewed  WET PREP, GENITAL - Abnormal; Notable for the following:    WBC, Wet Prep HPF POC FEW (*)    All other components within normal limits  GC/CHLAMYDIA PROBE AMP  URINALYSIS, ROUTINE W REFLEX MICROSCOPIC  POC URINE PREG, ED    Imaging Review US Transvaginal Non-ob  09/25/2013   CLINICAL DATA:  Left lower quadrant pain, suspect ovarian torsion, 2 week history vaginal bleeding  EXAM: TRANSABDOMINAL AND TRANSVAGINAL ULTRASOUND OF PELVIS  DOPPLER ULTRASOUND OF OVARIES  TECHNIQUE: Both transabdominal and transvaginal ultrasound examinations of the pelvis were performed. Transabdominal technique was performed for global imaging of the pelvis including uterus, ovaries, adnexal regions, and pelvic cul-de-sac.  It was necessary to proceed with endovaginal exam following the transabdominal exam to visualize the uterus and adnexal structures. Color and duplex Doppler ultrasound was utilized to evaluate blood flow to the ovaries.  COMPARISON:  None.  FINDINGS: Uterus  Measurements: 9.2 x 5.4 x 6.0 cm. There are at least 2 fibroids present. One lies anteriorly in the fundus and is subendometrial and measures 3.7 x 3.3 x 2.8 cm. In the lower uterine segment posteriorly there is a fibroid measuring 2 x 1.1 x 1.4 cm.  Endometrium  Thickness: 5.2 mm.  No focal abnormality visualized.  Right ovary  Measurements: 3.5 x 1 1.2 x 1.3 cm. Normal appearance/no  adnexal mass.  Left ovary  Measurements: 2.7 x 1.3 x 1.4 cm. Normal appearance/no adnexal mass.  Pulsed Doppler evaluation of both ovaries demonstrates normal low-resistance arterial and venous waveforms.  Other findings  No free fluid.  IMPRESSION: 1. The ovaries are normal in echotexture and contour and vascularity. There are no findings to suggest torsion. 2. There are at least 2 fibroids within the uterus as described. The endometrial stripe is not abnormally thickened, but within the uterine fundus the subendometrial fibroid distorts the endometrial stripe.   Electronically Signed   By: David  Martinique   On: 09/25/2013 09:53   US Pelvis Complete  09/25/2013   CLINICAL DATA:  Left lower quadrant pain, suspect ovarian torsion, 2 week history vaginal bleeding  EXAM: TRANSABDOMINAL AND TRANSVAGINAL ULTRASOUND OF PELVIS  DOPPLER ULTRASOUND OF OVARIES  TECHNIQUE: Both transabdominal and transvaginal ultrasound examinations of the pelvis were performed. Transabdominal technique was performed for global imaging of the pelvis including uterus, ovaries, adnexal regions, and pelvic cul-de-sac.  It was necessary to proceed with endovaginal  exam following the transabdominal exam to visualize the uterus and adnexal structures. Color and duplex Doppler ultrasound was utilized to evaluate blood flow to the ovaries.  COMPARISON:  None.  FINDINGS: Uterus  Measurements: 9.2 x 5.4 x 6.0 cm. There are at least 2 fibroids present. One lies anteriorly in the fundus and is subendometrial and measures 3.7 x 3.3 x 2.8 cm. In the lower uterine segment posteriorly there is a fibroid measuring 2 x 1.1 x 1.4 cm.  Endometrium  Thickness: 5.2 mm.  No focal abnormality visualized.  Right ovary  Measurements: 3.5 x 1 1.2 x 1.3 cm. Normal appearance/no adnexal mass.  Left ovary  Measurements: 2.7 x 1.3 x 1.4 cm. Normal appearance/no adnexal mass.  Pulsed Doppler evaluation of both ovaries demonstrates normal low-resistance arterial and venous  waveforms.  Other findings  No free fluid.  IMPRESSION: 1. The ovaries are normal in echotexture and contour and vascularity. There are no findings to suggest torsion. 2. There are at least 2 fibroids within the uterus as described. The endometrial stripe is not abnormally thickened, but within the uterine fundus the subendometrial fibroid distorts the endometrial stripe.   Electronically Signed   By: David  Martinique   On: 09/25/2013 09:53   [place] Art/ven Flow Abd Pelv Doppler  09/25/2013   CLINICAL DATA:  Left lower quadrant pain, suspect ovarian torsion, 2 week history vaginal bleeding  EXAM: TRANSABDOMINAL AND TRANSVAGINAL ULTRASOUND OF PELVIS  DOPPLER ULTRASOUND OF OVARIES  TECHNIQUE: Both transabdominal and transvaginal ultrasound examinations of the pelvis were performed. Transabdominal technique was performed for global imaging of the pelvis including uterus, ovaries, adnexal regions, and pelvic cul-de-sac.  It was necessary to proceed with endovaginal exam following the transabdominal exam to visualize the uterus and adnexal structures. Color and duplex Doppler ultrasound was utilized to evaluate blood flow to the ovaries.  COMPARISON:  None.  FINDINGS: Uterus  Measurements: 9.2 x 5.4 x 6.0 cm. There are at least 2 fibroids present. One lies anteriorly in the fundus and is subendometrial and measures 3.7 x 3.3 x 2.8 cm. In the lower uterine segment posteriorly there is a fibroid measuring 2 x 1.1 x 1.4 cm.  Endometrium  Thickness: 5.2 mm.  No focal abnormality visualized.  Right ovary  Measurements: 3.5 x 1 1.2 x 1.3 cm. Normal appearance/no adnexal mass.  Left ovary  Measurements: 2.7 x 1.3 x 1.4 cm. Normal appearance/no adnexal mass.  Pulsed Doppler evaluation of both ovaries demonstrates normal low-resistance arterial and venous waveforms.  Other findings  No free fluid.  IMPRESSION: 1. The ovaries are normal in echotexture and contour and vascularity. There are no findings to suggest torsion. 2. There are  at least 2 fibroids within the uterus as described. The endometrial stripe is not abnormally thickened, but within the uterine fundus the subendometrial fibroid distorts the endometrial stripe.   Electronically Signed   By: David  Martinique   On: 09/25/2013 09:53     EKG Interpretation None      MDM   Final diagnoses:  Pelvic pain    Patient presents to ED for evaluation of pelvic pain. Pelvic US shows fibroids. Pelvic exam is unremarkable. No concern for PID, TOA, ectopic pregnancy, ovarian torsion. No n/v/d. Patient feels improved after pain medication in ED. She will follow up with GYN. Return instructions given. Vital signs stable for discharge. Discussed case with Dr. Ashok Cordia who agrees with plan. Patient / Family / Caregiver informed of clinical course, understand medical decision-making process, and agree  with plan.      Elwyn Lade, PA-C 09/25/13 1041

## 2013-09-25 NOTE — ED Notes (Signed)
Pt presents with NAD. Pt c/o of menstrual cycle x 2 weeks with onset of stomach pain last night. Denies N/V/D and fever. Denies any other vaginal discharge and odor

## 2013-09-25 NOTE — ED Notes (Signed)
Patient transported to Ultrasound 

## 2013-09-25 NOTE — ED Notes (Signed)
Patient made aware of need for urine sample. Pt states she urinated immediately prior to arriving, that she will attempt to urinate as soon as possible.

## 2013-09-25 NOTE — ED Notes (Signed)
Patient transported to X-ray 

## 2013-09-25 NOTE — Discharge Instructions (Signed)
°Emergency Department Resource Guide °1) Find a Doctor and Pay Out of Pocket °Although you won't have to find out who is covered by your insurance plan, it is a good idea to ask around and get recommendations. You will then need to call the office and see if the doctor you have chosen will accept you as a new patient and what types of options they offer for patients who are self-pay. Some doctors offer discounts or will set up payment plans for their patients who do not have insurance, but you will need to ask so you aren't surprised when you get to your appointment. ° °2) Contact Your Local Health Department °Not all health departments have doctors that can see patients for sick visits, but many do, so it is worth a call to see if yours does. If you don't know where your local health department is, you can check in your phone book. The CDC also has a tool to help you locate your state's health department, and many state websites also have listings of all of their local health departments. ° °3) Find a Walk-in Clinic °If your illness is not likely to be very severe or complicated, you may want to try a walk in clinic. These are popping up all over the country in pharmacies, drugstores, and shopping centers. They're usually staffed by nurse practitioners or physician assistants that have been trained to treat common illnesses and complaints. They're usually fairly quick and inexpensive. However, if you have serious medical issues or chronic medical problems, these are probably not your best option. ° °No Primary Care Doctor: °- Call [facility] at  832-8000 - they can help you locate a primary care doctor that  accepts your insurance, provides certain services, etc. °- Physician Referral Service- 1-800-533-3463 ° °Chronic Pain Problems: °Organization         Address  Phone   Notes  °[facility]  (336) [phone] Patients need to be referred by their primary care doctor.  ° °Medication  Assistance: °Organization         Address  Phone   Notes  °[place] [facility] [address] °[place], [place] [address] (336) 641-8030 --Must be a resident of [place] °-- Must have NO insurance coverage whatsoever (no Medicaid/ Medicare, etc.) °-- The pt. MUST have a primary care doctor that directs their care regularly and follows them in the community °  °[facility]  (866) 331-1348   °[employer]  (888) 892-1162   ° °Agencies that provide inexpensive medical care: °Organization         Address  Phone   Notes  °[facility]  (336) 832-8035   °[facility]    (336) 832-7272   °[facility] [address] °[place], [place] [address] (336) 832-4777   °[facility] [address], °[place] (336) 271-4999   °[facility]    (336) 373-0678   °[facility]    (336) 272-1050   °[facility] ° [address], [place] Phone:  (336) 832-4444, Fax:  (336) 832-4440 Hours of Operation:  9 am - 6 pm, M-F.  Also accepts Medicaid/Medicare and self-pay.  °[facility] ° [address], [place] Phone: (336) 832-3150, Fax: (336) [phone]. Hours of Operation:  8:30 am - 5:30 pm, M-F.  Also accepts Medicaid and self-pay.  °[facility] [place] [address], [place] Phone: (336) 878-6027   °[facility] [address], [place], [place] (336)723-1848, Ext. 123 Mondays & Thursdays: 7-9 AM.  First 15 patients are seen on a first come, first serve basis. °  ° °Medicaid-accepting [place] Providers: ° °Organization         Address  Phone   Notes  °[facility] [address], [place] (336) 641-2100 Also accepts self-pay patients.  °[facility] [address], [place] ° (336) 856-9996   °[facility] [address], [place]  (336) 288-8857   °[facility] [address] [place] [address], [place] (336) 299-7000   °Veita Bland [address], [place]  ° (336) 373-1557 Only accepts [place] Access Medicaid patients after they have their name applied to their card.  ° °Self-Pay (no insurance) in [place]: ° °Organization         Address  Phone   Notes  °Sickle Cell Patients, [facility] [address], [place] (336) 832-1970   °[facility] [address], [place] (336) 832-4400   °[facility] ° [address], [place] (336) 992-4800   °[facility]/Dr. Osei-Bonsu ° [address] [place] [address], [place] or [address], [place] (336) [phone] Phone number for both [place] and [place] locations is the same.  °[facility] [address], [place] (336) 299-0000   °[facility] [address] [place] [address], [place] or [address] (336) 852-7530 °(336) 878-2260   °[facility] [address],  (336) 350-1642, phone; (336) 294-5005, fax Sees patients 1st and 3rd Saturday of every month.  Must not qualify for public or private insurance (i.e. Medicaid, Medicare, [place] Health Choice, Veterans' Benefits) • Household income should be no more than 200% of the poverty level •The clinic cannot treat you if you are pregnant or think you are pregnant • Sexually transmitted diseases are not treated at the clinic.  ° ° °Dental Care: °Organization         Address  Phone  Notes  °[place] [facility] [address],  (336) 641-6152 Accepts children up to age 21 who are enrolled in Medicaid or [place] Health Choice; pregnant women with a Medicaid card; and children who have applied for Medicaid or [place] Health Choice, but were declined, whose parents can pay a reduced fee at time of service.  °[place]  [facility] [place]  [address], [place] (336) 641-7733 Accepts children up to age 21 who are enrolled in Medicaid or [place] Health Choice; pregnant women with a Medicaid card; and children who have applied for Medicaid or [place] Health Choice, but were declined, whose parents can pay a reduced fee at time of service.  °[facility] ° [address],  (336) 641-4533 Patients are seen by appointment only. Walk-ins are not accepted. [facility] will see patients 18 years of age and older. °Monday - Tuesday (8am-5pm) °Most Wednesdays (8:30-5pm) °$30 per visit, cash only  °[facility] ° [address], [place] (336) 641-4533 Patients are seen by appointment only. Walk-ins are not accepted. [facility] will see patients 18 years of age and older. °One   Wednesday Evening (Monthly: Volunteer Based).  $30 per visit, cash only  °[facility]  (919) 537-3737 for adults; Children under age 4, call Graduate Pediatric Dentistry at (919) [phone]. Children aged 4-14, please call (919) [phone] to request a pediatric application. ° Dental services are provided in all areas of dental care including fillings, crowns and bridges, complete and partial dentures, implants, gum treatment, root canals, and extractions. Preventive care is also provided. Treatment is provided to both adults and children. °Patients are selected via a lottery and there is often a waiting list. °  °[facility] [address], °[place] ° (336) 763-8833 [email or web address] °  °[facility] [address], [place], [place] (336)723-1848, Ext. 123 Second and Fourth Thursday of each month, opens at 6:30 AM; Clinic ends at 9 AM.  Patients are seen on a first-come first-served basis, and a limited number are seen during each clinic.  ° °[facility] ° [address], [place], [place] (336) 723-7904    Eligibility Requirements °You must have lived in [place], [place], or [place] counties for at least the last three months. °  You cannot be eligible for state or federal sponsored healthcare insurance, including Veterans Administration, Medicaid, or Medicare. °  You generally cannot be eligible for healthcare insurance through your employer.  °  How to apply: °Eligibility screenings are held every Tuesday and Wednesday afternoon from 1:00 pm until 4:00 pm. You do not need an appointment for the interview!  °[facility] [address], [place], [place] 336-631-2330   °[facility] [facility]  336-342-8273   °[facility]  336-703-3100   °[facility]  336-570-6415   ° °Behavioral Health Resources in the Community: °Intensive Outpatient Programs °Organization         Address  Phone  Notes  °[facility] [address], [place], [place] 336-878-6098   °[facility] [address], [place], [place] 336-832-9800   °[facility] [address], [place], [place] ° 336-882-2125   °[facility] [address],  °[place], [place] 1-800-853-5163 or 336-641-4981   °Substance Abuse Resources °Organization         Address  Phone  Notes  °[facility]  336-882-2125   °[facility]  336-784-9470   °[facility]  336-285-9073   °[facility]  336-845-3988   °Residential & Outpatient Substance Abuse Program  1-800-659-3381   °Psychological Services °Organization         Address  Phone  Notes  °[facility]  336- 832-9600   °[facility]  336- 378-7881   °[facility] [address], [place] 1-800-853-5163 or 336-641-4981   ° °Mobile Crisis Teams °Organization         Address  Phone  Notes  °[facility], Mobile Crisis Care Unit  1-877-626-1772   °Assertive °[facility] ° [address]  [place], [place] 336-834-9664   °Sharon DeEsch [address] °[place] 336-554-5454   ° °Self-Help/Support Groups °Organization         Address  Phone             Notes  °[facility] [place] - variety of support groups  336- 373-1402 Call for more information  °Narcotics Anonymous (NA), [facility] [address], °[place]  2 meetings at this location  ° °  Residential Treatment Programs °Organization         Address  Phone  Notes  °[facility] [address],    °[place]  1-866-801-8205   °[facility] ° [address], [place], [place] 704-293-8524   °[facility] [address], [place] 336-845-3988 Admissions: 8am-3pm M-F  °[facility] [address]    °[place], [place] 336-841-1104   °[facility] [address], [place], [place] 336-379-7146   °[facility] [address]  °[place], [place] 336-285-9073   °[facility] - Intensive Outpatient [address], [place], [place] 336-852-3033   °[facility] [address]  °[place], [place] 1-877-615-2722 or 336-784-9470   °[facility] [address] [place], [place] 336-227-7417 Accepts Medicaid  °[facility] [address]  °[place] 1-800-659-3381 Substance Abuse/Addiction Treatment  ° °[place] Behavioral Health Resources °Organization         Address  Phone  Notes  °[facility]  (888) 581-9988   °Julie Brannon, PhD [address] [place], [place]   (336) 349-5553 or (336) 951-0000   °[facility]   [address] °[place], [place] (336) 349-4454   °[facility] [address], [place], [place] (336) 342-8316 Insurance/Medicaid/sponsorship through [facility]  °[facility] [address]                                    [place], [place] (336) 342-8316 Therapy/tele-psych/case    °[facility] [address]  ° [place], [place] (336) 349-2233    °Dr. Arfeen  (336) 349-4544   °[facility] [place]  [employer] [place] [facility] 1) [address],  °2) [address], [place] °3)  [address], [place] (336) 349-3220 °(336) 342-7768 ° °(336) 342-8140   °[place] [facility] (336) 342-1394 or (336) 342-3537 (After Hours)    ° ° °

## 2013-09-26 LAB — GC/CHLAMYDIA PROBE AMP
CT Probe RNA: NEGATIVE
GC Probe RNA: NEGATIVE

## 2013-09-27 NOTE — ED Provider Notes (Signed)
Medical screening examination/treatment/procedure(s) were conducted as a shared visit with non-physician practitioner(s) and myself.  I personally evaluated the patient during the encounter.   EKG Interpretation None      Pt with hx uterine fibroids, c/o bil lower quad/pelvic pain, esp left. No nv. No distension. abd soft nt. No incarc hernia. No cva tenderness.   Mirna Mires, MD 09/27/13 [phone]

## 2013-12-03 ENCOUNTER — Encounter (HOSPITAL_COMMUNITY): Payer: Self-pay | Admitting: Emergency Medicine

## 2013-12-03 ENCOUNTER — Emergency Department (HOSPITAL_COMMUNITY)
Admission: EM | Admit: 2013-12-03 | Discharge: 2013-12-03 | Disposition: A | Discharge status: Home / Self Care | Payer: 59 | Attending: Emergency Medicine | Admitting: Emergency Medicine

## 2013-12-03 DIAGNOSIS — F17200 Nicotine dependence, unspecified, uncomplicated: Secondary | ICD-10-CM | POA: Insufficient documentation

## 2013-12-03 DIAGNOSIS — D649 Anemia, unspecified: Secondary | ICD-10-CM

## 2013-12-03 DIAGNOSIS — R5381 Other malaise: Secondary | ICD-10-CM | POA: Insufficient documentation

## 2013-12-03 DIAGNOSIS — R569 Unspecified convulsions: Secondary | ICD-10-CM

## 2013-12-03 DIAGNOSIS — Z8742 Personal history of other diseases of the female genital tract: Secondary | ICD-10-CM | POA: Insufficient documentation

## 2013-12-03 DIAGNOSIS — N920 Excessive and frequent menstruation with regular cycle: Secondary | ICD-10-CM

## 2013-12-03 DIAGNOSIS — G40909 Epilepsy, unspecified, not intractable, without status epilepticus: Secondary | ICD-10-CM | POA: Insufficient documentation

## 2013-12-03 DIAGNOSIS — R5383 Other fatigue: Secondary | ICD-10-CM

## 2013-12-03 DIAGNOSIS — Z79899 Other long term (current) drug therapy: Secondary | ICD-10-CM | POA: Insufficient documentation

## 2013-12-03 DIAGNOSIS — Z8619 Personal history of other infectious and parasitic diseases: Secondary | ICD-10-CM | POA: Insufficient documentation

## 2013-12-03 DIAGNOSIS — Z3202 Encounter for pregnancy test, result negative: Secondary | ICD-10-CM | POA: Insufficient documentation

## 2013-12-03 LAB — URINALYSIS, ROUTINE W REFLEX MICROSCOPIC
Bilirubin Urine: NEGATIVE
Glucose, UA: NEGATIVE mg/dL
Ketones, ur: NEGATIVE mg/dL
Leukocytes, UA: NEGATIVE
Nitrite: NEGATIVE
Protein, ur: NEGATIVE mg/dL
Specific Gravity, Urine: 1.01 (ref 1.005–1.030)
Urobilinogen, UA: 0.2 mg/dL (ref 0.0–1.0)
pH: 6 (ref 5.0–8.0)

## 2013-12-03 LAB — BASIC METABOLIC PANEL
BUN: 10 mg/dL (ref 6–23)
CALCIUM: 8.9 mg/dL (ref 8.4–10.5)
CO2: 24 mEq/L (ref 19–32)
Chloride: 108 mEq/L (ref 96–112)
Creatinine, Ser: 0.6 mg/dL (ref 0.50–1.10)
GFR calc Af Amer: >90 mL/min (ref >90)
Glucose, Bld: 100 mg/dL — ABNORMAL HIGH (ref 70–99)
Potassium: 3.5 mEq/L — ABNORMAL LOW (ref 3.7–5.3)
SODIUM: 146 meq/L (ref 137–147)

## 2013-12-03 LAB — CBC WITH DIFFERENTIAL/PLATELET
Basophils Absolute: 0.1 10*3/uL (ref 0.0–0.1)
Basophils Relative: 2 % — ABNORMAL HIGH (ref 0–1)
Eosinophils Absolute: 0 10*3/uL (ref 0.0–0.7)
Eosinophils Relative: 1 % (ref 0–5)
HCT: 25.8 % — ABNORMAL LOW (ref 36.0–46.0)
Hemoglobin: 7.7 g/dL — ABNORMAL LOW (ref 12.0–15.0)
Lymphocytes Relative: 41 % (ref 12–46)
Lymphs Abs: 1.5 10*3/uL (ref 0.7–4.0)
MCH: 20.1 pg — ABNORMAL LOW (ref 26.0–34.0)
MCHC: 29.8 g/dL — ABNORMAL LOW (ref 30.0–36.0)
MCV: 67.2 fL — ABNORMAL LOW (ref 78.0–100.0)
Monocytes Absolute: 0.4 10*3/uL (ref 0.1–1.0)
Monocytes Relative: 12 % (ref 3–12)
Neutro Abs: 1.7 10*3/uL (ref 1.7–7.7)
Neutrophils Relative %: 44 % (ref 43–77)
Platelets: 241 10*3/uL (ref 150–400)
RBC: 3.84 MIL/uL — ABNORMAL LOW (ref 3.87–5.11)
RDW: 20.5 % — ABNORMAL HIGH (ref 11.5–15.5)
WBC: 3.7 10*3/uL — ABNORMAL LOW (ref 4.0–10.5)

## 2013-12-03 LAB — URINE MICROSCOPIC-ADD ON

## 2013-12-03 LAB — RAPID URINE DRUG SCREEN, HOSP PERFORMED
Amphetamines: NOT DETECTED
Barbiturates: NOT DETECTED
Benzodiazepines: POSITIVE — AB
Cocaine: NOT DETECTED
Opiates: NOT DETECTED
Tetrahydrocannabinol: POSITIVE — AB

## 2013-12-03 LAB — PREGNANCY, URINE: PREG TEST UR: NEGATIVE

## 2013-12-03 LAB — ETHANOL: Alcohol, Ethyl (B): 127 mg/dL — ABNORMAL HIGH (ref 0–11)

## 2013-12-03 MED ORDER — DIVALPROEX SODIUM 500 MG PO DR TAB: 500.0000 mg | DELAYED_RELEASE_TABLET | Freq: Two times a day (BID) | ORAL | Status: DC

## 2013-12-03 MED ORDER — SODIUM CHLORIDE 0.9 % IV BOLUS (SEPSIS)
1000.0000 mL | Freq: Once | INTRAVENOUS | Status: AC
  Administered 2013-12-03: 1000 mL via INTRAVENOUS

## 2013-12-03 MED ORDER — FERROUS SULFATE 325 (65 FE) MG PO TABS: 325.0000 mg | ORAL_TABLET | Freq: Every day | ORAL | Status: DC

## 2013-12-03 MED ORDER — IBUPROFEN 800 MG PO TABS
800.0000 mg | ORAL_TABLET | Freq: Once | ORAL | Status: AC
  Administered 2013-12-03: 800 mg via ORAL
  Filled 2013-12-03: qty 1

## 2013-12-03 NOTE — ED Notes (Addendum)
Per EMS they were called out d/t pt having seizure.  On scene EMS gave pt was given 2.5 of midazolam and a right sided trumpet.  Per EMS pt did not appear to be in a true post ictal state.  ETOH smell radiating from pt.

## 2013-12-03 NOTE — Discharge Instructions (Signed)
Epilepsy People with epilepsy have times when they shake and jerk uncontrollably (seizures). This happens when there is a sudden change in brain function. Epilepsy may have many possible causes. Anything that disturbs the normal pattern of brain cell activity can lead to seizures. HOME CARE   Follow your doctor's instructions about driving and safety during normal activities.  Get enough sleep.  Only take medicine as told by your doctor.  Avoid things that you know can cause you to have seizures (triggers).  Write down when your seizures happen and what you remember about each seizure. Write down anything you think may have caused the seizure to happen.  Tell the people you live and work with that you have seizures. Make sure they know how to help you. They should:  Cushion your head and body.  Turn you on your side.  Not restrain you.  Not place anything inside your mouth.  Call for local emergency medical help if there is any question about what has happened.  Keep all follow-up visits with your doctor. This is very important. GET HELP IF:  You get an infection or start to feel sick. You may have more seizures when you are sick.  You are having seizures more often.  Your seizure pattern is changing. GET HELP RIGHT AWAY IF:   A seizure does not stop after a few seconds or minutes.  A seizure causes you to have trouble breathing.  A seizure gives you a very bad headache.  A seizure makes you unable to speak or use a part of your body. Document Released: 03/28/2009 Document Revised: 03/21/2013 Document Reviewed: 01/10/2013 [facility] Patient Information 2015 [place], [place]. This information is not intended to replace advice given to you by your health care provider. Make sure you discuss any questions you have with your health care provider. Menorrhagia Menorrhagia is the medical term for when your menstrual periods are heavy or last longer than usual. With menorrhagia,  every period you have may cause enough blood loss and cramping that you are unable to maintain your usual activities. CAUSES  In some cases, the cause of heavy periods is unknown, but a number of conditions may cause menorrhagia. Common causes include:  A problem with the hormone-producing thyroid gland (hypothyroid).  Noncancerous growths in the uterus (polyps or fibroids).  An imbalance of the estrogen and progesterone hormones.  One of your ovaries not releasing an egg during one or more months.  Side effects of having an intrauterine device (IUD).  Side effects of some medicines, such as anti-inflammatory medicines or blood thinners.  A bleeding disorder that stops your blood from clotting normally. SIGNS AND SYMPTOMS  During a normal period, bleeding lasts between 4 and 8 days. Signs that your periods are too heavy include:  You routinely have to change your pad or tampon every 1 or 2 hours because it is completely soaked.  You pass blood clots larger than 1 inch (2.5 cm) in size.  You have bleeding for more than 7 days.  You need to use pads and tampons at the same time because of heavy bleeding.  You need to wake up to change your pads or tampons during the night.  You have symptoms of anemia, such as tiredness, fatigue, or shortness of breath. DIAGNOSIS  Your health care provider will perform a physical exam and ask you questions about your symptoms and menstrual history. Other tests may be ordered based on what the health care provider finds during the exam.  These tests can include:  Blood tests. Blood tests are used to check if you are pregnant or have hormonal changes, a bleeding or thyroid disorder, low iron levels (anemia), or other problems.  Endometrial biopsy. Your health care provider takes a sample of tissue from the inside of your uterus to be examined under a microscope.  Pelvic ultrasound. This test uses sound waves to make a picture of your uterus,  ovaries, and vagina. The pictures can show if you have fibroids or other growths.  Hysteroscopy. For this test, your health care provider will use a small telescope to look inside your uterus. Based on the results of your initial tests, your health care provider may recommend further testing. TREATMENT  Treatment may not be needed. If it is needed, your health care provider may recommend treatment with one or more medicines first. If these do not reduce bleeding enough, a surgical treatment might be an option. The best treatment for you will depend on:   Whether you need to prevent pregnancy.  Your desire to have children in the future.  The cause and severity of your bleeding.  Your opinion and personal preference.  Medicines for menorrhagia may include:  Birth control methods that use hormones. These include the pill, skin patch, vaginal ring, shots that you get every 3 months, hormonal IUD, and implant. These treatments reduce bleeding during your menstrual period.  Medicines that thicken blood and slow bleeding.  Medicines that reduce swelling, such as ibuprofen.  Medicines that contain a synthetic hormone called progestin.   Medicines that make the ovaries stop working for a short time.  You may need surgical treatment for menorrhagia if the medicines are unsuccessful. Treatment options include:  Dilation and curettage (D&C). In this procedure, your health care provider opens (dilates) your cervix and then scrapes or suctions tissue from the lining of your uterus to reduce menstrual bleeding.  Operative hysteroscopy. This procedure uses a tiny tube with a light (hysteroscope) to view your uterine cavity and can help in the surgical removal of a polyp that may be causing heavy periods.  Endometrial ablation. Through various techniques, your health care provider permanently destroys the entire lining of your uterus (endometrium). After endometrial ablation, most women have  little or no menstrual flow. Endometrial ablation reduces your ability to become pregnant.  Endometrial resection. This surgical procedure uses an electrosurgical wire loop to remove the lining of the uterus. This procedure also reduces your ability to become pregnant.  Hysterectomy. Surgical removal of the uterus and cervix is a permanent procedure that stops menstrual periods. Pregnancy is not possible after a hysterectomy. This procedure requires anesthesia and hospitalization. HOME CARE INSTRUCTIONS   Only take over-the-counter or prescription medicines as directed by your health care provider. Take prescribed medicines exactly as directed. Do not change or switch medicines without consulting your health care provider.  Take any prescribed iron pills exactly as directed by your health care provider. Long-term heavy bleeding may result in low iron levels. Iron pills help replace the iron your body lost from heavy bleeding. Iron may cause constipation. If this becomes a problem, increase the bran, fruits, and roughage in your diet.  Do not take aspirin or medicines that contain aspirin 1 week before or during your menstrual period. Aspirin may make the bleeding worse.  If you need to change your sanitary pad or tampon more than once every 2 hours, stay in bed and rest as much as possible until the bleeding stops.  Eat  well-balanced meals. Eat foods high in iron. Examples are leafy green vegetables, meat, liver, eggs, and whole grain breads and cereals. Do not try to lose weight until the abnormal bleeding has stopped and your blood iron level is back to normal. SEEK MEDICAL CARE IF:   You soak through a pad or tampon every 1 or 2 hours, and this happens every time you have a period.  You need to use pads and tampons at the same time because you are bleeding so much.  You need to change your pad or tampon during the night.  You have a period that lasts for more than 8 days.  You pass clots  bigger than 1 inch wide.  You have irregular periods that happen more or less often than once a month.  You feel dizzy or faint.  You feel very weak or tired.  You feel short of breath or feel your heart is beating too fast when you exercise.  You have nausea and vomiting or diarrhea while you are taking your medicine.  You have any problems that may be related to the medicine you are taking. SEEK IMMEDIATE MEDICAL CARE IF:   You soak through 4 or more pads or tampons in 2 hours.  You have any bleeding while you are pregnant. MAKE SURE YOU:   Understand these instructions.  Will watch your condition.  Will get help right away if you are not doing well or get worse. Document Released: 05/31/2005 Document Revised: 06/05/2013 Document Reviewed: 11/19/2012 [facility] Patient Information 2015 [place], [place]. This information is not intended to replace advice given to you by your health care provider. Make sure you discuss any questions you have with your health care provider.

## 2013-12-03 NOTE — ED Notes (Signed)
Patient reports she has a headache.  Rating pain as a 10.

## 2013-12-03 NOTE — ED Notes (Signed)
Attempted to call pt's daughter Levada Dy  x 2 to have her come to pick the pt up.

## 2013-12-03 NOTE — ED Notes (Addendum)
Daughter Levada Dy contact info [phone]

## 2013-12-03 NOTE — ED Notes (Signed)
Called patient's family to come pick her up.  They are on the way.

## 2013-12-03 NOTE — ED Provider Notes (Signed)
CSN: [id number]     Arrival date & time 12/03/13  0053 History   First MD Initiated Contact with Patient 12/03/13 0250     Chief Complaint  Patient presents with  . Seizures     (Consider location/radiation/quality/duration/timing/severity/associated sxs/prior Treatment) HPI Comments: Patient is a 43 year old female with a history of epilepsy, fibroids, and menorrhagia who presents to the emergency department today for seizure-like activity. Per EMS, they were called to the house and do to patient having a seizure. On arrival, patient was not found to be actively seizing but was lethargic and drooling. Patient was treated with 2.5 mg of Versed. Per EMS, patient was not post ictal. On my presentation with the patient, patient is tired appearing but alert and oriented x3. She states that she has little recollection of the events of the evening. She does state she had 6 beers tonight. She states the last thing she or murmurs talking to her sister and next remembers being in the emergency department. Patient states that she feels tired, but has no other complaints. She denies associated fever, vision loss, difficulty speaking or swallowing, headache, dizziness, chest pain, shortness of breath, nausea or vomiting, numbness/paresthesias, and extremity weakness. Patient does state that she has some mild lower abdominal cramping secondary to her menstrual cycle which has been persistent over the last 2 weeks. No urinary symptoms.  Patient has been noncompliant with her seizure medication x 2 years. She is supposed to be on Depakote; states she cannot afford this. Last seizure 3 years ago.  The history is provided by the patient. No language interpreter was used.    Past Medical History  Diagnosis Date  . Epilepsy   . H/O blood clots   . Seizures   . H/O mumps   . H/O bacterial infection   . Anemia   . Fibroids    Past Surgical History  Procedure Laterality Date  . Wisdom tooth extraction    .  Cesarean section    . Btl     History reviewed. No pertinent family history. History  Substance Use Topics  . Smoking status: Current Every Day Smoker    Types: Cigars  . Smokeless tobacco: Never Used  . Alcohol Use: Yes     Comment: social   OB History   Grav Para Term Preterm Abortions TAB SAB Ect Mult Living   3 2 2  1  1   2       Review of Systems  Constitutional: Positive for fatigue.  Gastrointestinal: Positive for abdominal pain.  Genitourinary: Positive for vaginal discharge (menses).  All other systems reviewed and are negative.    Allergies  Review of patient's allergies indicates no known allergies.  Home Medications   Prior to Admission medications   Medication Sig Start Date End Date Taking? Authorizing Ezriel Boffa  divalproex (DEPAKOTE) 500 MG DR tablet Take 1 tablet (500 mg total) by mouth 2 (two) times daily. 12/03/13   Antonietta Breach, PA-C  ferrous sulfate 325 (65 FE) MG tablet Take 1 tablet (325 mg total) by mouth daily. 12/03/13   Antonietta Breach, PA-C   BP 133/81  Pulse 104  Temp(Src) 97.9 F (36.6 C) (Oral)  Resp 20  SpO2 99%  LMP 12/03/2013  Physical Exam  Nursing note and vitals reviewed. Constitutional: She is oriented to person, place, and time. She appears well-developed and well-nourished. No distress.  Nontoxic/nonseptic appearing. Does appear fatigued.  HENT:  Head: Normocephalic and atraumatic.  Mouth/Throat: Oropharynx is clear and moist.  No oropharyngeal exudate.  Symmetric rise of the uvula with phonation  Eyes: Conjunctivae and EOM are normal. Pupils are equal, round, and reactive to light. No scleral icterus.  Normal EOMs. No nystagmus.  Neck: Normal range of motion. Neck supple.  No nuchal rigidity or meningismus  Cardiovascular: Normal rate, regular rhythm and normal heart sounds.   Pulmonary/Chest: Effort normal and breath sounds normal. No respiratory distress. She has no wheezes. She has no rales.  Abdominal: Soft. She exhibits no  distension. There is tenderness. There is no rebound and no guarding.  Tenderness to palpation in the suprapubic region without masses. No peritoneal signs.  Musculoskeletal: Normal range of motion.  Neurological: She is alert and oriented to person, place, and time. She has normal reflexes. No cranial nerve deficit. She exhibits normal muscle tone. Coordination normal.  GCS 15. Patient speaks in full goal oriented sentences. No cranial nerve deficits appreciated; symmetric eyebrow raise, no facial drooping, equal tongue protrusion, tongue midline. Patient is extremities without ataxia. No pronator drift. No gross sensory deficits appreciated with 5/5 strength against resistance in all extremities. Finger to nose intact.  Skin: Skin is warm and dry. No rash noted. She is not diaphoretic. No erythema. No pallor.  Psychiatric: She has a normal mood and affect. Her behavior is normal.    ED Course  Procedures (including critical care time) Labs Review Labs Reviewed  CBC WITH DIFFERENTIAL - Abnormal; Notable for the following:    WBC 3.7 (*)    RBC 3.84 (*)    Hemoglobin 7.7 (*)    HCT 25.8 (*)    MCV 67.2 (*)    MCH 20.1 (*)    MCHC 29.8 (*)    RDW 20.5 (*)    Basophils Relative 2 (*)    All other components within normal limits  BASIC METABOLIC PANEL - Abnormal; Notable for the following:    Potassium 3.5 (*)    Glucose, Bld 100 (*)    All other components within normal limits  URINE RAPID DRUG SCREEN (HOSP PERFORMED) - Abnormal; Notable for the following:    Benzodiazepines POSITIVE (*)    Tetrahydrocannabinol POSITIVE (*)    All other components within normal limits  ETHANOL - Abnormal; Notable for the following:    Alcohol, Ethyl (B) 127 (*)    All other components within normal limits  URINALYSIS, ROUTINE W REFLEX MICROSCOPIC - Abnormal; Notable for the following:    Hgb urine dipstick SMALL (*)    All other components within normal limits  PREGNANCY, URINE  URINE  MICROSCOPIC-ADD ON    Imaging Review No results found.   EKG Interpretation None      MDM   Final diagnoses:  Menorrhagia with regular cycle  Anemia, unspecified anemia type  Seizure-like activity    43 year old female with a history of epilepsy presents after seizure-like activity. Patient has had no seizure activity while in the emergency department today. No seizure activity witnessed by EMS on arrival and EMS denies any postictal state. Patient does endorse drinking alcohol this evening. Ethanol level 127. Patient states she has been noncompliant with her Depakote x2 years secondary to cost. If patient did have a seizure this evening, seizure likely secondary to medication noncompliance. Neurologic exam today is nonfocal. No evidence of acute head trauma. Patient explicitly denies headache, vision loss, N/V, numbness/paresthesias, and weakness.   Labs today otherwise significant for hemoglobin of 7.7. This appears decreased from baseline one year ago; however, patient has a history of  menorrhagia and has been menstruating x2 weeks which is likely the cause of her microcytic anemia today. Abdominal reexaminations stable. Doubt abdominal cramping to be emergent in nature. Also likely secondary to menstruation. Patient states cramping is c/w this.   Patient has been stable over 6 hour  ED course. Do not believe further work up is indicated at this time. Have advised patient restart her Depakote and will place patient on iron pills for her anemia. She has been advised to f/u with an OBGYN for further evaluation of her menorrhagia and repeat CBC. Return precautions provided and patient agreeable to plan with no unaddressed concerns. Patient seen also by Dr. Lita Mains who is in agreement with this workup, assessment, management plan, and patient's stability for discharge.   Filed Vitals:   12/03/13 0627 12/03/13 0630 12/03/13 0730 12/03/13 0809  BP:  141/91 134/86 133/81  Pulse:  102 96  104  Temp: 98.8 F (37.1 C)   97.9 F (36.6 C)  TempSrc:    Oral  Resp:  18 20   SpO2:  98% 98% 99%       Antonietta Breach, PA-C 12/03/13 2220

## 2013-12-03 NOTE — ED Notes (Signed)
Bed: YJ09 Expected date:  Expected time:  Means of arrival:  Comments: EMS seizures/ ETOH

## 2013-12-03 NOTE — ED Notes (Signed)
Pt ambulated to bathroom with minimal assistance.

## 2013-12-03 NOTE — ED Notes (Signed)
Pt was able to wake up and speak w/ this Probation officer. Pt is A&O x3.  Pt states she cannot afford her epilepsy medications.  Last seizure was 3 years ago.  Pt has been drooling out the side of her mouth, pt suctioned and no further saliva noted. Pt denies pain.

## 2013-12-04 NOTE — ED Provider Notes (Signed)
Medical screening examination/treatment/procedure(s) were conducted as a shared visit with non-physician practitioner(s) and myself.  I personally evaluated the patient during the encounter.   EKG Interpretation None      Patient presents with witnessed seizure-like activity. Has a history of noncompliance with anti-epileptic medication. Currently with a normal neuro exam. She admits to heavy menstrual bleeding stable in the past 2 weeks. Currently denies any pain, shortness of breath or weakness.  Patient with anemia likely secondary to menorrhagia. I believe that transfusion necessary at this point. We'll place on iron supplementation and have followup with OB/GYN. Regarding her seizure-like activity will restart her antiepileptic medication and advised to followup with neurology.  Julianne Rice, MD 12/04/13 [phone]

## 2013-12-10 ENCOUNTER — Ambulatory Visit: Payer: 59 | Admitting: Internal Medicine

## 2014-04-15 ENCOUNTER — Encounter (HOSPITAL_COMMUNITY): Payer: Self-pay | Admitting: Emergency Medicine

## 2015-09-06 ENCOUNTER — Ambulatory Visit: Payer: Self-pay

## 2015-11-17 ENCOUNTER — Ambulatory Visit (INDEPENDENT_AMBULATORY_CARE_PROVIDER_SITE_OTHER): Payer: 59 | Admitting: Physician Assistant

## 2015-11-17 ENCOUNTER — Ambulatory Visit (INDEPENDENT_AMBULATORY_CARE_PROVIDER_SITE_OTHER): Payer: 59

## 2015-11-17 VITALS — BP 120/72 | HR 82 | Temp 97.7°F | Resp 18 | Ht 68.5 in | Wt 166.8 lb

## 2015-11-17 DIAGNOSIS — M25561 Pain in right knee: Secondary | ICD-10-CM

## 2015-11-17 MED ORDER — MELOXICAM 15 MG PO TABS: 15.0000 mg | ORAL_TABLET | Freq: Every day | ORAL | Status: DC

## 2015-11-17 NOTE — Patient Instructions (Addendum)
     IF you received an x-ray today, you will receive an invoice from [facility]. Please contact [facility] at [phone] with questions or concerns regarding your invoice.   IF you received labwork today, you will receive an invoice from [employer]. Please contact [facility] at [phone] with questions or concerns regarding your invoice.   Our billing staff will not be able to assist you with questions regarding bills from these companies.  You will be contacted with the lab results as soon as they are available. The fastest way to get your results is to activate your My Chart account. Instructions are located on the last page of this paperwork. If you have not heard from [place] regarding the results in 2 weeks, please contact this office.    Please schedule a visit with your neurologist to discuss whether or not you need to be on medication for the seizures, since they made you feel bad and you haven't had a seizure since you stopped it.

## 2015-11-17 NOTE — Progress Notes (Signed)
Patient ID: Tina Moss, female     DOB: Jan 26, 1971, 45 y.o.    MRN: [id number]  PCP: Pcp Not In System  Chief Complaint  Patient presents with  . Knee Pain    right knee; x3 weeks ago; getting worse    Subjective:    HPI  Presents for evaluation of RIGHT knee pain.  It's like "Rubbing in the sand, cause I can hear it, has been going on for 2-3 months." When her knee started swelling, hurting, she thought maybe she should get it checked. Difficult to get in and out of the car. Back of the calf hurts really bad, but no swelling or redness. Climbing steps feels like it's rubbing, but has pain with descending the steps. Vacuuming at work increased her pain. Also has pain with Mopping the floor and Simple walking. Has tried nothing to alleviate the pain. No previous symptoms like this.   Prior to Admission medications   Not on File     No Known Allergies   Patient Active Problem List   Diagnosis Date Noted  . Fibroids 01/18/2012  . Menorrhagia 01/18/2012  . Dysmenorrhea 01/18/2012  . Seizure disorder ([place]) 01/18/2012  . Anemia 01/18/2012     Family History  Problem Relation Age of Onset  . Hypertension Mother   . Hypertension Sister   . Arthritis Sister     knees  . Hypertension Brother   . Deafness Brother   . Speech disorder Brother     mute  . Mental illness Brother     "he can just fly off"  . Mental illness Daughter     ? bipolar  . Scoliosis Son      Social History   Social History  . Marital Status: Divorced    Spouse Name: N/A  . Number of Children: N/A  . Years of Education: N/A   Occupational History  . Housekeeper     [employer]   Social History Main Topics  . Smoking status: Current Every Day Smoker    Types: Cigars  . Smokeless tobacco: Never Used     Comment: "I think I just keep so much on my mind."  . Alcohol Use: 0.0 - 1.8 oz/week    0-3 Standard drinks or equivalent per week     Comment: social  . Drug Use: Yes   Special: Marijuana     Comment: seldom  . Sexual Activity:    Partners: Male   Other Topics Concern  . Not on file   Social History Narrative        Review of Systems As above.      Objective:  Physical Exam  Constitutional: She is oriented to person, place, and time. She appears well-developed and well-nourished. She is active and cooperative. No distress.  BP 120/72 mmHg  Pulse 82  Temp(Src) 97.7 F (36.5 C) (Oral)  Resp 18  Ht 5' 8.5" (1.74 m)  Wt 166 lb 12.8 oz (75.66 kg)  BMI 24.99 kg/m2  SpO2 100%  LMP 11/17/2015   Eyes: Conjunctivae are normal.  Pulmonary/Chest: Effort normal.  Musculoskeletal:       Right knee: She exhibits swelling and bony tenderness. She exhibits normal range of motion, no effusion, no ecchymosis, no deformity, no laceration, no erythema, normal alignment, no LCL laxity, normal patellar mobility and no MCL laxity. Tenderness found. Medial joint line, lateral joint line, MCL, LCL and patellar tendon tenderness noted.       Left knee: Normal.  Left lower leg: Normal.  Neurological: She is alert and oriented to person, place, and time.  Skin: Skin is warm and dry. No lesion and no rash noted.  Psychiatric: She has a normal mood and affect. Her speech is normal and behavior is normal.        Dg Knee Complete 4 Views Right  11/17/2015  CLINICAL DATA:  Right knee pain.  No reported injury. EXAM: RIGHT KNEE - COMPLETE 4+ VIEW COMPARISON:  None. FINDINGS: Small suprapatellar right knee joint effusion. No fracture, dislocation or suspicious focal osseous lesion. No evidence of degenerative or erosive arthropathy. Tiny superior right patellar enthesophyte. No radiopaque foreign body. IMPRESSION: Small suprapatellar right knee joint effusion. No fracture, malalignment or additional findings of arthropathy. Electronically Signed   By: Ilona Sorrel M.D.   On: 11/17/2015 13:39        Assessment & Plan:  1. Knee pain, right Small effusion see  on radiographs. Crutches to reduce weight bearing. Meloxicam. Re-evaluation on 6/09, with plans for her to RTW on Monday 11/24/15. - DG Knee Complete 4 Views Right; Future - meloxicam (MOBIC) 15 MG tablet; Take 1 tablet (15 mg total) by mouth daily.  Dispense: 30 tablet; Refill: 0   Fara Chute, PA-C Physician Assistant-Certified [facility]

## 2015-11-21 ENCOUNTER — Ambulatory Visit (INDEPENDENT_AMBULATORY_CARE_PROVIDER_SITE_OTHER): Payer: 59 | Admitting: Physician Assistant

## 2015-11-21 VITALS — BP 130/70 | HR 97 | Temp 97.9°F | Resp 18 | Ht 67.0 in | Wt 172.8 lb

## 2015-11-21 DIAGNOSIS — G5603 Carpal tunnel syndrome, bilateral upper limbs: Secondary | ICD-10-CM | POA: Diagnosis not present

## 2015-11-21 DIAGNOSIS — M25561 Pain in right knee: Secondary | ICD-10-CM | POA: Diagnosis not present

## 2015-11-21 DIAGNOSIS — G35D Multiple sclerosis, unspecified: Secondary | ICD-10-CM | POA: Insufficient documentation

## 2015-11-21 DIAGNOSIS — G35 Multiple sclerosis: Secondary | ICD-10-CM

## 2015-11-21 MED ORDER — MELOXICAM 15 MG PO TABS: 15.0000 mg | ORAL_TABLET | Freq: Every day | ORAL | Status: DC

## 2015-11-21 NOTE — Progress Notes (Signed)
Subjective:    Patient ID: Tina Moss, female    DOB: Jan 31, 1971, 45 y.o.   MRN: [id number]  HPI  Patient presents today for scheduled 4 day follow up of knee pain.  Last visit on 11/17/15 showed a small effusion on imaging and patient was prescribed meloxicam for pain and inflammation.  Today she states she it is feeling a lot better than it did.  Still has pain with sleeping, "like it is going to sleep" and pain with walking up the stairs.  Decrease in the amount of "popping" she was hearing.   Medication provided at visit seems to be helping.  Complains of pins and needle feeling in both hands at the same time, while doing activites such as driving, cooking, does not happen while at rest.  Usually last 5-96min, she stops what she is doing and starts rubbing them til it goes away.  Its been going on for about 3-4 months.  Denies trauma, change in bowel or bladder habits, chest pain, headache, and fever.  Reveals past diagnosis of MS that has never been addressed or treated because "I did not want to get shots."     Review of Systems All others negative except those listed in HPI.  Patient Active Problem List   Diagnosis Date Noted  . MS (multiple sclerosis) ([place]) 11/21/2015  . Fibroids 01/18/2012  . Menorrhagia 01/18/2012  . Dysmenorrhea 01/18/2012  . Seizure disorder ([place]) 01/18/2012  . Anemia 01/18/2012   No current outpatient prescriptions on file prior to visit.   No current facility-administered medications on file prior to visit.   No Known Allergies    Objective:   Physical Exam  Constitutional: She is oriented to person, place, and time. She appears well-developed and well-nourished.  Cardiovascular: Normal rate, regular rhythm, normal heart sounds and intact distal pulses.   Pulmonary/Chest: Effort normal and breath sounds normal.  Musculoskeletal:       Right knee: She exhibits decreased range of motion and swelling. Tenderness found. Medial joint line,  lateral joint line and patellar tendon tenderness noted.  Bilateral +Tinel sign  Pain with active extension and flexion of knee  Neurological: She is alert and oriented to person, place, and time.  Skin: Skin is warm and dry.  Psychiatric: She has a normal mood and affect. Her behavior is normal. Thought content normal.          Assessment & Plan:  1. Knee pain, right Patient pain is improved but not resolved.  We discussed her plan to return to work on Monday and granted the request with stipulations of decreased work load to include no kneeling, squatting and limited stair use. Referred patient to ortho for further evaluation. Refilled meloxicam to help with pain relief. - Ambulatory referral to Orthopedic Surgery - meloxicam (MOBIC) 15 MG tablet; Take 1 tablet (15 mg total) by mouth daily.  Dispense: 30 tablet; Refill: 0  2. Bilateral carpal tunnel syndrome Discussed with patient cause of carpal tunnel likely due to her job.   Provided wrist splints and education on their application and use.   Informed patient to wear whenever possible including while asleep and at work but not to wear while doing tasks that would cause them to get wet.    3. MS (multiple sclerosis) ([place]) Discussed the benefits receiving evaluation and treatment of this disorder and the impact it could have on longevity of her quality of life.  Patient agreed to referral to neurology for evaluation and management. -  Ambulatory referral to Neurology  Informed patient to return in 1 week for re-evalution and return to work planning.  Patient instructed to return sooner if questions or concerns should arise.  Quanna Wittke P. Raul Winterhalter, PA-S

## 2015-11-21 NOTE — Patient Instructions (Addendum)
     IF you received an x-ray today, you will receive an invoice from [facility]. Please contact [facility] at [phone] with questions or concerns regarding your invoice.   IF you received labwork today, you will receive an invoice from [facility]. Please contact [facility] at [phone] with questions or concerns regarding your invoice.   Our billing staff will not be able to assist you with questions regarding bills from these companies.  You will be contacted with the lab results as soon as they are available. The fastest way to get your results is to activate your My Chart account. Instructions are located on the last page of this paperwork. If you have not heard from us regarding the results in 2 weeks, please contact this office.      

## 2015-11-22 NOTE — Progress Notes (Signed)
Patient ID: Tina Moss, female    DOB: 1971-04-28, 45 y.o.   MRN: [id number]  PCP: Pcp Not In System  Subjective:   Chief Complaint  Patient presents with  . Follow-up    right knee pain    HPI Presents for evaluation of Right knee pain.  I saw her on 6/05 for same. Radiographs showed a small effusion. She was prescribed meloxicam and kept out of work, with anticipation that she would be much better today and be able to return to regular work on 6/12.  Feels a lot better, but not well. Pain is worse at night when she's trying to go to sleep and she describes a pins and needles felling. She also has pain with climbing stairs. The popping and "sand grinding" felling/sound has decreased.  She also complains of pins and needles sensation in both hands whenever she does an activity that uses her hands (driving, cooking) that never occurs at rest. The symptoms last 5-10 minutes and resolve when she rubs/massages the hands. First occurred about 4 months ago.  Today she reveals a diagnosis of MS that she did not previously reveal. She declined treatment at the time of her diagnosis because she understood the only treatment option was injections, which she did not want. She is now concerned that her symptoms may be due to MS.  No HA, double vision, weakness/falls/dropping things, increased fatigue.    Review of Systems As above.    Patient Active Problem List   Diagnosis Date Noted  . MS (multiple sclerosis) ([place]) 11/21/2015  . Fibroids 01/18/2012  . Menorrhagia 01/18/2012  . Dysmenorrhea 01/18/2012  . Seizure disorder ([place]) 01/18/2012  . Anemia 01/18/2012     Prior to Admission medications   Medication Sig Start Date End Date Taking? Authorizing Provider  meloxicam (MOBIC) 15 MG tablet Take 1 tablet (15 mg total) by mouth daily. 11/21/15  Yes Bonna Steury, PA-C     No Known Allergies     Objective:  Physical Exam  Constitutional: She is oriented to person, place,  and time. She appears well-developed and well-nourished. She is active and cooperative. No distress.  BP 130/70 mmHg  Pulse 97  Temp(Src) 97.9 F (36.6 C) (Oral)  Resp 18  Ht 5\' 7"  (1.702 m)  Wt 172 lb 12.8 oz (78.382 kg)  BMI 27.06 kg/m2  SpO2 98%  LMP 11/17/2015   Eyes: Conjunctivae are normal.  Pulmonary/Chest: Effort normal.  Musculoskeletal:       Right knee: She exhibits decreased range of motion and swelling. She exhibits no effusion, no ecchymosis, no deformity, no laceration, no erythema, normal alignment, no LCL laxity, normal patellar mobility, no bony tenderness, normal meniscus and no MCL laxity. Tenderness found. Medial joint line, lateral joint line and patellar tendon tenderness noted.       Right lower leg: She exhibits tenderness and swelling (1+).  Neurological: She is alert and oriented to person, place, and time. She has normal strength. No cranial nerve deficit or sensory deficit.  Reflex Scores:      Patellar reflexes are 2+ on the right side and 2+ on the left side. Positive Tinel's both wrists  Psychiatric: She has a normal mood and affect. Her speech is normal and behavior is normal.           Assessment & Plan:   1. Knee pain, right Continue meloxicam. Back to work, but modified duty to avoid kneeling, crouching and stair climbing. Refer to ortho for additional  evaluation and treatment. - Ambulatory referral to Orthopedic Surgery - meloxicam (MOBIC) 15 MG tablet; Take 1 tablet (15 mg total) by mouth daily.  Dispense: 30 tablet; Refill: 0  2. Bilateral carpal tunnel syndrome Bilateral wrist splints.  3. MS (multiple sclerosis) ([place]) Refer back to neurology to review treatment options. - Ambulatory referral to Neurology   Fara Chute, PA-C Physician Assistant-Certified [facility]

## 2015-12-22 ENCOUNTER — Telehealth: Payer: Self-pay

## 2015-12-22 ENCOUNTER — Ambulatory Visit: Payer: 59 | Admitting: Neurology

## 2015-12-22 NOTE — Telephone Encounter (Signed)
Pt called to cancel/reschedule new patient appt d/t emergency.

## 2015-12-29 ENCOUNTER — Encounter: Payer: Self-pay | Admitting: Neurology

## 2016-01-08 ENCOUNTER — Telehealth: Payer: Self-pay

## 2016-01-08 ENCOUNTER — Ambulatory Visit: Payer: 59 | Admitting: Neurology

## 2016-01-08 NOTE — Telephone Encounter (Signed)
Pt again cancelled same day new patient appt.

## 2016-01-14 ENCOUNTER — Encounter: Payer: Self-pay | Admitting: Neurology

## 2016-09-15 ENCOUNTER — Emergency Department (HOSPITAL_COMMUNITY)
Admission: EM | Admit: 2016-09-15 | Discharge: 2016-09-15 | Disposition: A | Discharge status: Home / Self Care | Payer: BLUE CROSS/BLUE SHIELD | Attending: Emergency Medicine | Admitting: Emergency Medicine

## 2016-09-15 ENCOUNTER — Emergency Department (HOSPITAL_COMMUNITY): Payer: BLUE CROSS/BLUE SHIELD

## 2016-09-15 ENCOUNTER — Encounter (HOSPITAL_COMMUNITY): Payer: Self-pay | Admitting: *Deleted

## 2016-09-15 DIAGNOSIS — S0083XA Contusion of other part of head, initial encounter: Secondary | ICD-10-CM | POA: Diagnosis not present

## 2016-09-15 DIAGNOSIS — Z23 Encounter for immunization: Secondary | ICD-10-CM | POA: Diagnosis not present

## 2016-09-15 DIAGNOSIS — Y999 Unspecified external cause status: Secondary | ICD-10-CM | POA: Diagnosis not present

## 2016-09-15 DIAGNOSIS — Y92410 Unspecified street and highway as the place of occurrence of the external cause: Secondary | ICD-10-CM | POA: Diagnosis not present

## 2016-09-15 DIAGNOSIS — Y939 Activity, unspecified: Secondary | ICD-10-CM | POA: Insufficient documentation

## 2016-09-15 DIAGNOSIS — T07XXXA Unspecified multiple injuries, initial encounter: Secondary | ICD-10-CM

## 2016-09-15 DIAGNOSIS — S0990XA Unspecified injury of head, initial encounter: Secondary | ICD-10-CM

## 2016-09-15 DIAGNOSIS — F17290 Nicotine dependence, other tobacco product, uncomplicated: Secondary | ICD-10-CM | POA: Insufficient documentation

## 2016-09-15 DIAGNOSIS — S90511A Abrasion, right ankle, initial encounter: Secondary | ICD-10-CM | POA: Insufficient documentation

## 2016-09-15 DIAGNOSIS — S50311A Abrasion of right elbow, initial encounter: Secondary | ICD-10-CM | POA: Insufficient documentation

## 2016-09-15 DIAGNOSIS — M79671 Pain in right foot: Secondary | ICD-10-CM

## 2016-09-15 DIAGNOSIS — M25571 Pain in right ankle and joints of right foot: Secondary | ICD-10-CM

## 2016-09-15 MED ORDER — KETOROLAC TROMETHAMINE 30 MG/ML IJ SOLN
30.0000 mg | Freq: Once | INTRAMUSCULAR | Status: AC
  Administered 2016-09-15: 30 mg via INTRAMUSCULAR
  Filled 2016-09-15: qty 1

## 2016-09-15 MED ORDER — TETANUS-DIPHTH-ACELL PERTUSSIS 5-2.5-18.5 LF-MCG/0.5 IM SUSP
0.5000 mL | Freq: Once | INTRAMUSCULAR | Status: AC
  Administered 2016-09-15: 0.5 mL via INTRAMUSCULAR
  Filled 2016-09-15: qty 0.5

## 2016-09-15 NOTE — Progress Notes (Signed)
Orthopedic Tech Progress Note Patient Details:  Tina Moss 1970/10/07 [id number]  Ortho Devices Type of Ortho Device: ASO Ortho Device/Splint Location: rle Ortho Device/Splint Interventions: Application   Sharlet Salina, Shatonia Hoots 09/15/2016, 2:52 PM

## 2016-09-15 NOTE — ED Notes (Signed)
Patient taken to xray.
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
## 2016-09-15 NOTE — ED Notes (Signed)
Paged ortho tech 

## 2016-09-15 NOTE — ED Notes (Signed)
Wound care and dressing completed.

## 2016-09-15 NOTE — ED Notes (Signed)
Elevated heart rate reported to PA and MD.

## 2016-09-15 NOTE — ED Notes (Signed)
EDP at bedside  

## 2016-09-15 NOTE — ED Notes (Signed)
HR reported to PA. OK'd for discharge.
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
## 2016-09-16 ENCOUNTER — Ambulatory Visit: Payer: BLUE CROSS/BLUE SHIELD | Attending: Internal Medicine | Admitting: Physician Assistant

## 2016-09-16 VITALS — BP 146/99 | HR 115 | Temp 97.7°F | Ht 67.0 in | Wt 171.0 lb

## 2016-09-16 DIAGNOSIS — G40909 Epilepsy, unspecified, not intractable, without status epilepticus: Secondary | ICD-10-CM | POA: Insufficient documentation

## 2016-09-16 DIAGNOSIS — D509 Iron deficiency anemia, unspecified: Secondary | ICD-10-CM

## 2016-09-16 DIAGNOSIS — R030 Elevated blood-pressure reading, without diagnosis of hypertension: Secondary | ICD-10-CM

## 2016-09-16 DIAGNOSIS — R52 Pain, unspecified: Secondary | ICD-10-CM

## 2016-09-16 DIAGNOSIS — G35 Multiple sclerosis: Secondary | ICD-10-CM | POA: Diagnosis not present

## 2016-09-16 DIAGNOSIS — S161XXA Strain of muscle, fascia and tendon at neck level, initial encounter: Secondary | ICD-10-CM | POA: Diagnosis not present

## 2016-09-16 DIAGNOSIS — E785 Hyperlipidemia, unspecified: Secondary | ICD-10-CM | POA: Diagnosis not present

## 2016-09-16 DIAGNOSIS — Z043 Encounter for examination and observation following other accident: Secondary | ICD-10-CM

## 2016-09-16 DIAGNOSIS — Z041 Encounter for examination and observation following transport accident: Secondary | ICD-10-CM

## 2016-09-16 DIAGNOSIS — M542 Cervicalgia: Secondary | ICD-10-CM | POA: Diagnosis present

## 2016-09-16 MED ORDER — METHOCARBAMOL 500 MG PO TABS: 500.0000 mg | ORAL_TABLET | Freq: Three times a day (TID) | ORAL | 0 refills | Status: DC

## 2016-09-16 MED ORDER — MELOXICAM 15 MG PO TABS: 15.0000 mg | ORAL_TABLET | Freq: Every day | ORAL | 0 refills | Status: DC
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
## 2016-09-17 LAB — CBC WITH DIFFERENTIAL/PLATELET
BASOS ABS: 0 10*3/uL (ref 0.0–0.2)
Basos: 1 %
EOS (ABSOLUTE): 0.1 10*3/uL (ref 0.0–0.4)
EOS: 1 %
HEMOGLOBIN: 9.6 g/dL — AB (ref 11.1–15.9)
Hematocrit: 31.9 % — ABNORMAL LOW (ref 34.0–46.6)
IMMATURE GRANULOCYTES: 0 %
Immature Grans (Abs): 0 10*3/uL (ref 0.0–0.1)
Lymphocytes Absolute: 1.1 10*3/uL (ref 0.7–3.1)
Lymphs: 21 %
MCH: 23.4 pg — ABNORMAL LOW (ref 26.6–33.0)
MCHC: 30.1 g/dL — AB (ref 31.5–35.7)
MCV: 78 fL — ABNORMAL LOW (ref 79–97)
MONOCYTES: 11 %
Monocytes Absolute: 0.6 10*3/uL (ref 0.1–0.9)
NEUTROS PCT: 66 %
Neutrophils Absolute: 3.6 10*3/uL (ref 1.4–7.0)
PLATELETS: 261 10*3/uL (ref 150–379)
RBC: 4.1 x10E6/uL (ref 3.77–5.28)
RDW: 23.6 % — ABNORMAL HIGH (ref 12.3–15.4)
WBC: 5.5 10*3/uL (ref 3.4–10.8)

## 2016-09-20 ENCOUNTER — Encounter: Payer: Self-pay | Admitting: Neurology

## 2016-09-22 ENCOUNTER — Telehealth: Payer: Self-pay

## 2016-09-22 NOTE — Telephone Encounter (Signed)
Contacted pt to go over lab results pt is aware of results and doesn't have any questions or concerns 

## 2016-10-12 ENCOUNTER — Other Ambulatory Visit: Payer: Self-pay | Admitting: Physician Assistant

## 2016-10-12 DIAGNOSIS — R52 Pain, unspecified: Secondary | ICD-10-CM

## 2016-10-13 ENCOUNTER — Encounter: Payer: Self-pay | Admitting: Family Medicine

## 2016-10-13 ENCOUNTER — Ambulatory Visit: Payer: BLUE CROSS/BLUE SHIELD | Attending: Family Medicine | Admitting: Family Medicine

## 2016-10-13 VITALS — BP 168/85 | HR 121 | Temp 98.2°F | Resp 18 | Ht 67.5 in | Wt 171.6 lb

## 2016-10-13 DIAGNOSIS — G35 Multiple sclerosis: Secondary | ICD-10-CM

## 2016-10-13 DIAGNOSIS — I10 Essential (primary) hypertension: Secondary | ICD-10-CM | POA: Insufficient documentation

## 2016-10-13 DIAGNOSIS — Z79899 Other long term (current) drug therapy: Secondary | ICD-10-CM | POA: Insufficient documentation

## 2016-10-13 DIAGNOSIS — R000 Tachycardia, unspecified: Secondary | ICD-10-CM | POA: Diagnosis not present

## 2016-10-13 DIAGNOSIS — E785 Hyperlipidemia, unspecified: Secondary | ICD-10-CM | POA: Diagnosis not present

## 2016-10-13 DIAGNOSIS — G40909 Epilepsy, unspecified, not intractable, without status epilepticus: Secondary | ICD-10-CM | POA: Diagnosis not present

## 2016-10-13 DIAGNOSIS — R030 Elevated blood-pressure reading, without diagnosis of hypertension: Secondary | ICD-10-CM | POA: Diagnosis present

## 2016-10-13 MED ORDER — METOPROLOL TARTRATE 100 MG PO TABS: 100.0000 mg | ORAL_TABLET | Freq: Two times a day (BID) | ORAL | 3 refills | Status: DC

## 2016-10-13 NOTE — Patient Instructions (Signed)
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
## 2016-10-14 ENCOUNTER — Telehealth: Payer: Self-pay | Admitting: *Deleted

## 2016-10-14 LAB — CMP14+EGFR
ALT: 17 IU/L (ref 0–32)
AST: 18 IU/L (ref 0–40)
Albumin/Globulin Ratio: 1.5 (ref 1.2–2.2)
Albumin: 4.1 g/dL (ref 3.5–5.5)
Alkaline Phosphatase: 103 IU/L (ref 39–117)
BUN/Creatinine Ratio: 19 (ref 9–23)
BUN: 11 mg/dL (ref 6–24)
Bilirubin Total: 0.2 mg/dL (ref 0.0–1.2)
CO2: 22 mmol/L (ref 18–29)
Calcium: 9.1 mg/dL (ref 8.7–10.2)
Chloride: 103 mmol/L (ref 96–106)
Creatinine, Ser: 0.58 mg/dL (ref 0.57–1.00)
GFR calc Af Amer: 127 mL/min/1.73 (ref >59)
GFR calc non Af Amer: 110 mL/min/1.73 (ref >59)
Globulin, Total: 2.8 g/dL (ref 1.5–4.5)
Glucose: 90 mg/dL (ref 65–99)
Potassium: 3.6 mmol/L (ref 3.5–5.2)
Sodium: 141 mmol/L (ref 134–144)
Total Protein: 6.9 g/dL (ref 6.0–8.5)

## 2016-10-14 LAB — TSH: TSH: 0.847 u[IU]/mL (ref 0.450–4.500)

## 2016-10-14 NOTE — Telephone Encounter (Signed)
Date of birth verified by pt Normal Lab results given  Pt verbalized understanding

## 2016-11-15 ENCOUNTER — Ambulatory Visit: Payer: BLUE CROSS/BLUE SHIELD | Attending: Family Medicine | Admitting: Family Medicine

## 2016-11-15 ENCOUNTER — Encounter: Payer: Self-pay | Admitting: Family Medicine

## 2016-11-15 VITALS — BP 109/70 | HR 108 | Temp 98.2°F | Resp 18 | Ht 67.0 in | Wt 167.0 lb

## 2016-11-15 DIAGNOSIS — I10 Essential (primary) hypertension: Secondary | ICD-10-CM | POA: Diagnosis present

## 2016-11-15 DIAGNOSIS — D500 Iron deficiency anemia secondary to blood loss (chronic): Secondary | ICD-10-CM | POA: Diagnosis not present

## 2016-11-15 DIAGNOSIS — G35 Multiple sclerosis: Secondary | ICD-10-CM | POA: Diagnosis not present

## 2016-11-15 DIAGNOSIS — D252 Subserosal leiomyoma of uterus: Secondary | ICD-10-CM

## 2016-11-15 DIAGNOSIS — F329 Major depressive disorder, single episode, unspecified: Secondary | ICD-10-CM | POA: Diagnosis not present

## 2016-11-15 DIAGNOSIS — M62838 Other muscle spasm: Secondary | ICD-10-CM | POA: Diagnosis not present

## 2016-11-15 DIAGNOSIS — D250 Submucous leiomyoma of uterus: Secondary | ICD-10-CM

## 2016-11-15 DIAGNOSIS — N920 Excessive and frequent menstruation with regular cycle: Secondary | ICD-10-CM | POA: Insufficient documentation

## 2016-11-15 DIAGNOSIS — F32A Depression, unspecified: Secondary | ICD-10-CM | POA: Insufficient documentation

## 2016-11-15 DIAGNOSIS — G40909 Epilepsy, unspecified, not intractable, without status epilepticus: Secondary | ICD-10-CM | POA: Insufficient documentation

## 2016-11-15 DIAGNOSIS — F3289 Other specified depressive episodes: Secondary | ICD-10-CM

## 2016-11-15 DIAGNOSIS — E785 Hyperlipidemia, unspecified: Secondary | ICD-10-CM | POA: Insufficient documentation

## 2016-11-15 MED ORDER — CYCLOBENZAPRINE HCL 10 MG PO TABS: 10.0000 mg | ORAL_TABLET | Freq: Every day | ORAL | 2 refills | Status: DC

## 2016-11-15 MED ORDER — VENLAFAXINE HCL ER 75 MG PO CP24: 75.0000 mg | ORAL_CAPSULE | Freq: Every day | ORAL | 3 refills | Status: DC

## 2016-11-15 MED ORDER — FERROUS SULFATE 325 (65 FE) MG PO TABS: 325.0000 mg | ORAL_TABLET | Freq: Every day | ORAL | 3 refills | Status: DC

## 2016-11-15 NOTE — Progress Notes (Signed)
Subjective:  Patient ID: Tina Moss, female    DOB: [birth year]  Age: 46 y.o. MRN: [id number]  CC: Hypertension   HPI Tina Moss Is a 46 year old female with a history of seizures (seizure - free for the last 5 years and is not currently on antiseizure medication), diagnosed with multiple sclerosis one year ago (has an upcoming Neurology appointment) who presents today for follow-up of Newly diagnosed Hypertension as she was commenced on metoprolol at her last office visit with resulting improvement in blood pressure.  She complains of intermittent spasms in her right lower extremity which occur every morning and does have some numbness in her right leg and right hand but denies visual complaints or urinary symptoms.  Review of her chart indicates she does have anemia with her last hemoglobin at 9.6; she endorses menorrhagia. Pelvic ultrasound from 2016 revealed fibroids. Also complains of depression and easy irritability which is new to her. Describes low energy, lack of motivation but denies suicidal ideation or intentions. She also experiences hot flashes.  Past Medical History:  Diagnosis Date  . Anemia   . Epilepsy ([place])   . Fibroids   . H/O bacterial infection   . H/O blood clots   . H/O mumps   . Hyperlipidemia   . Seizures ([place])     Past Surgical History:  Procedure Laterality Date  . CESAREAN SECTION    . TUBAL LIGATION    . WISDOM TOOTH EXTRACTION      No Known Allergies   Outpatient Medications Prior to Visit  Medication Sig Dispense Refill  . metoprolol (LOPRESSOR) 100 MG tablet Take 1 tablet (100 mg total) by mouth 2 (two) times daily. 60 tablet 3   No facility-administered medications prior to visit.     ROS Review of Systems  Constitutional: Negative for activity change, appetite change and fatigue.  HENT: Negative for congestion, sinus pressure and sore throat.   Eyes: Negative for visual disturbance.  Respiratory: Negative for cough, chest  tightness, shortness of breath and wheezing.   Cardiovascular: Negative for chest pain and palpitations.  Gastrointestinal: Negative for abdominal distention, abdominal pain and constipation.  Endocrine: Negative for polydipsia.  Genitourinary: Positive for menstrual problem. Negative for dysuria and frequency.  Musculoskeletal: Positive for myalgias. Negative for arthralgias and back pain.  Skin: Negative for rash.  Neurological: Positive for numbness. Negative for tremors and light-headedness.  Hematological: Does not bruise/bleed easily.  Psychiatric/Behavioral: Negative for agitation and behavioral problems.    Objective:  BP 109/70 (BP Location: Right Arm, Patient Position: Sitting, Cuff Size: Normal)   Pulse (!) 108   Temp 98.2 F (36.8 C) (Oral)   Resp 18   Ht 5\' 7"  (1.702 m)   Wt 167 lb (75.8 kg)   LMP 11/12/2016   SpO2 99%   BMI 26.16 kg/m   BP/Weight 11/15/2016 11/15/8754 09/14/3293  Systolic BP 188 416 606  Diastolic BP 70 85 99  Wt. (Lbs) 167 171.6 171  BMI 26.16 26.48 26.78     Physical Exam  Constitutional: She is oriented to person, place, and time. She appears well-developed and well-nourished.  Cardiovascular: Normal rate, normal heart sounds and intact distal pulses.   No murmur heard. Pulmonary/Chest: Effort normal and breath sounds normal. She has no wheezes. She has no rales. She exhibits no tenderness.  Abdominal: Soft. Bowel sounds are normal. She exhibits no distension and no mass. There is no tenderness.  Musculoskeletal: Normal range of motion. She exhibits edema and tenderness.  Neurological: She is alert and oriented to person, place, and time. She exhibits normal muscle tone. Coordination normal.    CBC    Component Value Date/Time   WBC 5.5 09/16/2016 1701   WBC 3.7 (L) 12/03/2013 0439   RBC 4.10 09/16/2016 1701   RBC 3.84 (L) 12/03/2013 0439   HGB 7.7 (L) 12/03/2013 0439   HCT 31.9 (L) 09/16/2016 1701   PLT 261 09/16/2016 1701   MCV 78 (L)  09/16/2016 1701   MCH 23.4 (L) 09/16/2016 1701   MCH 20.1 (L) 12/03/2013 0439   MCHC 30.1 (L) 09/16/2016 1701   MCHC 29.8 (L) 12/03/2013 0439   RDW 23.6 (H) 09/16/2016 1701   LYMPHSABS 1.1 09/16/2016 1701   MONOABS 0.4 12/03/2013 0439   EOSABS 0.1 09/16/2016 1701   BASOSABS 0.0 09/16/2016 1701    CMP Latest Ref Rng & Units 10/13/2016 12/03/2013 01/22/2008  Glucose 65 - 99 mg/dL 90 100(H) 93  BUN 6 - 24 mg/dL 11 10 9   Creatinine 0.57 - 1.00 mg/dL 0.58 0.60 0.52  Sodium 134 - 144 mmol/L 141 146 137  Potassium 3.5 - 5.2 mmol/L 3.6 3.5(L) 3.4(L)  Chloride 96 - 106 mmol/L 103 108 107  CO2 18 - 29 mmol/L 22 24 26   Calcium 8.7 - 10.2 mg/dL 9.1 8.9 8.7  Total Protein 6.0 - 8.5 g/dL 6.9 - -  Total Bilirubin 0.0 - 1.2 mg/dL 0.2 - -  Alkaline Phos 39 - 117 IU/L 103 - -  AST 0 - 40 IU/L 18 - -  ALT 0 - 32 IU/L 17 - -     Assessment & Plan:   1. Other depression Could be perimenopausal Commenced on Effexor for depression  2. MS (multiple sclerosis) ([place]) Upcoming appointment with neurology in 2 weeks  3. Essential hypertension Controlled on metoprolol  4. Muscle spasm Discussed sedating side effect of cyclobenzaprine and she knows not to take this while driving or operating machinery - cyclobenzaprine (FLEXERIL) 10 MG tablet; Take 1 tablet (10 mg total) by mouth at bedtime.  Dispense: 30 tablet; Refill: 2  5. Iron deficiency anemia due to chronic blood loss Secondary to menorrhagia - ferrous sulfate 325 (65 FE) MG tablet; Take 1 tablet (325 mg total) by mouth daily with breakfast.  Dispense: 60 tablet; Refill: 3  6. Submucous and subserous leiomyoma of uterus - Ambulatory referral to Gynecology   Meds ordered this encounter  Medications  . ferrous sulfate 325 (65 FE) MG tablet    Sig: Take 1 tablet (325 mg total) by mouth daily with breakfast.    Dispense:  60 tablet    Refill:  3  . cyclobenzaprine (FLEXERIL) 10 MG tablet    Sig: Take 1 tablet (10 mg total) by mouth at  bedtime.    Dispense:  30 tablet    Refill:  2    Follow-up: Return in about 3 months (around 02/15/2017) for Follow-up of chronic medical conditions.   This note has been created with Surveyor, quantity. Any transcriptional errors are unintentional.     Arnoldo Morale MD

## 2016-11-15 NOTE — Patient Instructions (Signed)
Uterine Fibroids Uterine fibroids are tissue masses (tumors). They are also called leiomyomas. They can develop inside of a woman's womb (uterus). They can grow very large. Fibroids are not cancerous (benign). Most fibroids do not require medical treatment. Follow these instructions at home:  Keep all follow-up visits as told by your doctor. This is important.  Take medicines only as told by your doctor. ? If you were prescribed a hormone treatment, take the hormone medicines exactly as told. ? Do not take aspirin. It can cause bleeding.  Ask your doctor about taking iron pills and increasing the amount of dark green, leafy vegetables in your diet. These actions can help to boost your blood iron levels.  Pay close attention to your period. Tell your doctor about any changes, such as: ? Increased blood flow. This may require you to use more pads or tampons than usual per month. ? A change in the number of days that your period lasts per month. ? A change in symptoms that come with your period, such as back pain or cramping in your belly area (abdomen). Contact a doctor if:  You have pain in your back or the area between your hip bones (pelvic area) that is not controlled by medicines.  You have pain in your abdomen that is not controlled with medicines.  You have an increase in bleeding between and during periods.  You soak tampons or pads in a half hour or less.  You feel lightheaded.  You feel extra tired.  You feel weak. Get help right away if:  You pass out (faint).  You have a sudden increase in pelvic pain. This information is not intended to replace advice given to you by your health care provider. Make sure you discuss any questions you have with your health care provider. Document Released: 07/03/2010 Document Revised: 01/30/2016 Document Reviewed: 11/27/2013 Elsevier Interactive Patient Education  2018 Elsevier Inc.  

## 2016-11-15 NOTE — Progress Notes (Signed)
Patient is here for FU HTN  Patient denies pain at this time.  Patient has not taken medication today. Patient has eaten today.

## 2016-11-23 ENCOUNTER — Encounter: Payer: Self-pay | Admitting: Obstetrics and Gynecology

## 2016-11-30 ENCOUNTER — Ambulatory Visit: Payer: Self-pay | Admitting: Neurology

## 2016-12-14 ENCOUNTER — Ambulatory Visit (INDEPENDENT_AMBULATORY_CARE_PROVIDER_SITE_OTHER): Payer: BLUE CROSS/BLUE SHIELD | Admitting: Family Medicine

## 2016-12-14 ENCOUNTER — Encounter: Payer: Self-pay | Admitting: Family Medicine

## 2016-12-14 VITALS — BP 152/88 | HR 96 | Temp 98.0°F | Resp 16 | Ht 67.0 in | Wt 162.2 lb

## 2016-12-14 DIAGNOSIS — I517 Cardiomegaly: Secondary | ICD-10-CM | POA: Insufficient documentation

## 2016-12-14 DIAGNOSIS — F17210 Nicotine dependence, cigarettes, uncomplicated: Secondary | ICD-10-CM

## 2016-12-14 DIAGNOSIS — D250 Submucous leiomyoma of uterus: Secondary | ICD-10-CM

## 2016-12-14 DIAGNOSIS — R079 Chest pain, unspecified: Secondary | ICD-10-CM | POA: Diagnosis not present

## 2016-12-14 DIAGNOSIS — D252 Subserosal leiomyoma of uterus: Secondary | ICD-10-CM

## 2016-12-14 DIAGNOSIS — R002 Palpitations: Secondary | ICD-10-CM

## 2016-12-14 DIAGNOSIS — I10 Essential (primary) hypertension: Secondary | ICD-10-CM | POA: Diagnosis not present

## 2016-12-14 DIAGNOSIS — D500 Iron deficiency anemia secondary to blood loss (chronic): Secondary | ICD-10-CM

## 2016-12-14 DIAGNOSIS — Z716 Tobacco abuse counseling: Secondary | ICD-10-CM | POA: Diagnosis not present

## 2016-12-14 DIAGNOSIS — Z720 Tobacco use: Secondary | ICD-10-CM | POA: Diagnosis not present

## 2016-12-14 LAB — POCT CBC
GRANULOCYTE PERCENT: 51.9 % (ref 37–80)
HCT, POC: 31.1 % — AB (ref 37.7–47.9)
Hemoglobin: 9.5 g/dL — AB (ref 12.2–16.2)
Lymph, poc: 1.6 (ref 0.6–3.4)
MCH: 22.8 pg — AB (ref 27–31.2)
MCHC: 30.7 g/dL — AB (ref 31.8–35.4)
MCV: 74.5 fL — AB (ref 80–97)
MID (cbc): 0.2 (ref 0–0.9)
MPV: 7.5 fL (ref 0–99.8)
PLATELET COUNT, POC: 281 10*3/uL (ref 142–424)
POC Granulocyte: 2 (ref 2–6.9)
POC LYMPH PERCENT: 41.9 %L (ref 10–50)
POC MID %: 6.2 %M (ref 0–12)
RBC: 4.18 M/uL (ref 4.04–5.48)
RDW, POC: 19.8 %
WBC: 3.8 10*3/uL — AB (ref 4.6–10.2)

## 2016-12-14 MED ORDER — MEDROXYPROGESTERONE ACETATE 150 MG/ML IM SUSY: 150.0000 mg | PREFILLED_SYRINGE | INTRAMUSCULAR | Status: DC

## 2016-12-14 NOTE — Assessment & Plan Note (Signed)
Discussed that she should take the metoprolol

## 2016-12-14 NOTE — Assessment & Plan Note (Signed)
Will refer to Cardiology Avoid smoking and continue metoprolol

## 2016-12-14 NOTE — Assessment & Plan Note (Signed)
Normal ecg today

## 2016-12-14 NOTE — Patient Instructions (Addendum)
IF you received an x-ray today, you will receive an invoice from [facility] Rad[facility]. Please contact [facility] Radiology at [phone] with questions or concerns regarding your invoice.   IF you received labwork today, you will receive an invoice from [facility]. Please contact [facility] at [phone] with questions or concerns regarding your invoice.   Our billing staff will not be able to assist you with questions regarding bills from these companies.  You will be contacted with the lab results as soon as they are available. The fastest way to get your results is to activate your My Chart account. Instructions are located on the last page of this paperwork. If you have not heard from [place] regarding the results in 2 weeks, please contact this office.    We recommend that you schedule a mammogram for breast cancer screening. Typically, you do not need a referral to do this. Please contact a local imaging center to schedule your mammogram.  [facility] - [phone]  *ask for the Radiology Department [facility] - [phone] or [phone]  [facility] - [phone] [place] [phone] [facility] - [phone]  *ask for the [facility] - [phone]  *ask for the Radiology Department [facility] - [phone]  *ask for the [facility] - [phone] Steps to Quit Smoking Smoking tobacco can be bad for your health. It can also affect almost every organ in your body. Smoking puts you and people around you at risk for many serious long-lasting (chronic) diseases. Quitting smoking is hard, but it is one of the best things that you can do for your health. It is never too late to quit. What are the benefits of quitting smoking? When you quit smoking, you lower your risk for getting serious diseases and conditions. They  can include:  Lung cancer or lung disease.  Heart disease.  Stroke.  Heart attack.  Not being able to have children (infertility).  Weak bones (osteoporosis) and broken bones (fractures).  If you have coughing, wheezing, and shortness of breath, those symptoms may get better when you quit. You may also get sick less often. If you are pregnant, quitting smoking can help to lower your chances of having a baby of low birth weight. What can I do to help me quit smoking? Talk with your doctor about what can help you quit smoking. Some things you can do (strategies) include:  Quitting smoking totally, instead of slowly cutting back how much you smoke over a period of time.  Going to in-person counseling. You are more likely to quit if you go to many counseling sessions.  Using resources and support systems, such as: ? Database administrator with a Social worker. ? Phone quitlines. ? Careers information officer. ? Support groups or group counseling. ? Text messaging programs. ? Mobile phone apps or applications.  Taking medicines. Some of these medicines may have nicotine in them. If you are pregnant or breastfeeding, do not take any medicines to quit smoking unless your doctor says it is okay. Talk with your doctor about counseling or other things that can help you.  Talk with your doctor about using more than one strategy at the same time, such as taking medicines while you are also going to in-person counseling. This can help make quitting easier. What things can I do to make it easier to quit? Quitting smoking might feel very  hard at first, but there is a lot that you can do to make it easier. Take these steps:  Talk to your family and friends. Ask them to support and encourage you.  Call phone quitlines, reach out to support groups, or work with a Social worker.  Ask people who smoke to not smoke around you.  Avoid places that make you want (trigger) to smoke, such  as: ? Bars. ? Parties. ? Smoke-break areas at work.  Spend time with people who do not smoke.  Lower the stress in your life. Stress can make you want to smoke. Try these things to help your stress: ? Getting regular exercise. ? Deep-breathing exercises. ? Yoga. ? Meditating. ? Doing a body scan. To do this, close your eyes, focus on one area of your body at a time from head to toe, and notice which parts of your body are tense. Try to relax the muscles in those areas.  Download or buy apps on your mobile phone or tablet that can help you stick to your quit plan. There are many free apps, such as QuitGuide from the [employer] Office manager for Disease Control and Prevention). You can find more support from smokefree.gov and other websites.  This information is not intended to replace advice given to you by your health care provider. Make sure you discuss any questions you have with your health care provider. Document Released: 03/27/2009 Document Revised: 01/27/2016 Document Reviewed: 10/15/2014 Elsevier Interactive Patient Education  2018 [employer].

## 2016-12-14 NOTE — Assessment & Plan Note (Signed)
Discussed that she should control her bp and continue her metoprolol

## 2016-12-14 NOTE — Assessment & Plan Note (Addendum)
Discussed ways to treat her heavy menses and prevent the chronic blood loss. Pt is s/p tubal ligation. Will not give Depo due to history of blood clot referral to Gynecology. Pt would be a candidate for minimally invasive procedure like [place].

## 2016-12-14 NOTE — Progress Notes (Signed)
Chief Complaint  Patient presents with  . Chest Pain    onset: 2-3 months, rapid hr when lying down, dyspnea, dizziness and faint feeling.  Intermittent pain w/ it pain shoots across chest and up patient's side- feels lik a needle shooting across and up side.  Pain leve 8/10    HPI  Palpitations: Patient complains of chest pain, palpitations, rapid heart beat and dizziness.  The symptoms are of intermittent and 8 / 10 severity, occuring intermittently and lasting 20 minutes per episode. Cardiac risk factors include: hypertension and smoking/ tobacco exposure. Aggravating factors: none. Relieving factors: spontaneous. Associated signs and symptoms: has complaint(s) of chest pain and palpitations.  Smoking She smokes black and mild cigars She breaks it up and rolls her own cigarettes She cannot quantify an amount but its less than one cigar a day She has been smoking for over 10 years  Hypertension She reports that she has not taken her metoprolol today She does not know how high her pulse gets or how low it gets related to her metoprolol She reports that she takes the metoprolol most of the time She states that she typically has a high pulse rate   History of uterine fibroids She has not been taking any iron for a month She has very heavy periods Patient's last menstrual period was 11/23/2016.  Lab Results  Component Value Date   WBC 3.8 (A) 12/14/2016   HGB 9.5 (A) 12/14/2016   HCT 31.1 (A) 12/14/2016   MCV 74.5 (A) 12/14/2016   PLT 261 09/16/2016   IMPRESSION: 1. The ovaries are normal in echotexture and contour and vascularity. There are no findings to suggest torsion. 2. There are at least 2 fibroids within the uterus as described. The endometrial stripe is not abnormally thickened, but within the uterine fundus the subendometrial fibroid distorts the endometrial stripe.   Electronically Signed   By: David  [place]   On: 09/25/2013 09:53   Past Medical  History:  Diagnosis Date  . Anemia   . Epilepsy ([place])   . Fibroids   . H/O bacterial infection   . H/O blood clots   . H/O mumps   . Hyperlipidemia   . Seizures ([place])     Current Outpatient Prescriptions  Medication Sig Dispense Refill  . cyclobenzaprine (FLEXERIL) 10 MG tablet Take 1 tablet (10 mg total) by mouth at bedtime. 30 tablet 2  . metoprolol (LOPRESSOR) 100 MG tablet Take 1 tablet (100 mg total) by mouth 2 (two) times daily. 60 tablet 3  . ferrous sulfate 325 (65 FE) MG tablet Take 1 tablet (325 mg total) by mouth daily with breakfast. (Patient not taking: Reported on 12/14/2016) 60 tablet 3  . venlafaxine XR (EFFEXOR XR) 75 MG 24 hr capsule Take 1 capsule (75 mg total) by mouth daily with breakfast. (Patient not taking: Reported on 12/14/2016) 30 capsule 3   No current facility-administered medications for this visit.     Allergies: No Known Allergies  Past Surgical History:  Procedure Laterality Date  . CESAREAN SECTION    . TUBAL LIGATION    . WISDOM TOOTH EXTRACTION      Social History   Social History  . Marital status: Divorced    Spouse name: n/a  . Number of children: 2  . Years of education: 11th grade   Occupational History  . Housekeeper     [employer]   Social History Main Topics  . Smoking status: Current Every Day Smoker  Types: Cigars  . Smokeless tobacco: Current User     Comment: "I think I just keep so much on my mind."  . Alcohol use 0.0 - 1.8 oz/week     Comment: social  . Drug use: Yes    Types: Marijuana     Comment: seldom  . Sexual activity: Not Currently    Partners: Male   Other Topics Concern  . None   Social History Narrative   Lives with her daughter.   Son is currently incarcerated in [place].   She completed 11th grade, but was kicked out and never when back, as she had become pregnant and was raising her daughter.   Divorced x 2.    ROS See hpi Review of Systems See HPI Constitution: No fevers or chills No  malaise No diaphoresis Skin: No rash or itching Eyes: no blurry vision, no double vision GU: no dysuria or hematuria Neuro: no dizziness or headaches  Objective: Vitals:   12/14/16 0830  BP: (!) 152/88  Pulse: 96  Resp: 16  Temp: 98 F (36.7 C)  TempSrc: Oral  SpO2: 100%  Weight: 162 lb 3.2 oz (73.6 kg)  Height: 5\' 7"  (1.702 m)    Physical Exam  Constitutional: She is oriented to person, place, and time. She appears well-developed and well-nourished.  HENT:  Head: Normocephalic and atraumatic.  Nose: Nose normal.  Mouth/Throat: Oropharynx is clear and moist.  Eyes: Conjunctivae and EOM are normal.  Neck: Normal range of motion. No thyromegaly present.  Cardiovascular: Normal rate, regular rhythm and normal heart sounds.   No murmur heard. Pulmonary/Chest: Effort normal and breath sounds normal. No respiratory distress. She has no wheezes.  Abdominal: Soft. Bowel sounds are normal. She exhibits no distension. There is no tenderness. There is no guarding.  Musculoskeletal: Normal range of motion. She exhibits no edema.  Neurological: She is alert and oriented to person, place, and time.  Psychiatric: She has a normal mood and affect. Her behavior is normal. Judgment and thought content normal.     ECG with HR 92, LVH criteria, nonspecific st segment changes No twi  Assessment and Plan  Problem List Items Addressed This Visit      Cardiovascular and Mediastinum   Essential hypertension    Discussed that she should take the metoprolol       LVH (left ventricular hypertrophy)    Discussed that she should control her bp and continue her metoprolol      Relevant Orders   Ambulatory referral to Cardiology     Genitourinary   Fibroids    Discussed ways to treat her heavy menses and prevent the chronic blood loss. Pt is s/p tubal ligation. Will not give Depo due to history of blood clot referral to Gynecology      Relevant Orders   Ambulatory referral to  Gynecology     Other   Anemia - Primary    Pt takes otc iron 30mg  but not taken in the past month. Advised to increase her iron to 30mg  bid. She needs at least 60mg  of iron daily.      Relevant Orders   Iron and TIBC   Heart palpitations    Will refer to Cardiology Avoid smoking and continue metoprolol      Relevant Orders   EKG 12-Lead (Completed)   TSH   POCT CBC (Completed)   Ambulatory referral to Cardiology   Intermittent chest pain    Normal ecg today  Relevant Orders   Ambulatory referral to Cardiology    Other Visit Diagnoses    Encounter for smoking cessation counseling       discussed usage pattern and the effects of smoking on the heart. Pt is in precontemplation. She is not a frequent smoker.      A total of 60 minutes were spent face-to-face with the patient during this encounter and over half of that time was spent on counseling and coordination of care.   [place]

## 2016-12-14 NOTE — Assessment & Plan Note (Addendum)
Pt takes otc iron 30mg  but not taken in the past month. Advised to increase her iron to 30mg  bid. She needs at least 60mg  of iron daily.

## 2016-12-15 LAB — IRON AND TIBC
IRON SATURATION: 5 % — AB (ref 15–55)
IRON: 22 ug/dL — AB (ref 27–159)
Total Iron Binding Capacity: 473 ug/dL — ABNORMAL HIGH (ref 250–450)
UIBC: 451 ug/dL — ABNORMAL HIGH (ref 131–425)

## 2016-12-15 LAB — TSH: TSH: 0.989 u[IU]/mL (ref 0.450–4.500)

## 2016-12-16 ENCOUNTER — Encounter: Payer: Self-pay | Admitting: Family Medicine

## 2016-12-22 ENCOUNTER — Encounter: Payer: Self-pay | Admitting: Obstetrics and Gynecology

## 2016-12-22 ENCOUNTER — Telehealth: Payer: Self-pay | Admitting: Family Medicine

## 2016-12-22 ENCOUNTER — Other Ambulatory Visit (HOSPITAL_COMMUNITY)
Admission: RE | Admit: 2016-12-22 | Discharge: 2016-12-22 | Disposition: A | Discharge status: Home / Self Care | Payer: BLUE CROSS/BLUE SHIELD | Source: Ambulatory Visit | Attending: Obstetrics and Gynecology | Admitting: Obstetrics and Gynecology

## 2016-12-22 ENCOUNTER — Ambulatory Visit (INDEPENDENT_AMBULATORY_CARE_PROVIDER_SITE_OTHER): Payer: BLUE CROSS/BLUE SHIELD | Admitting: Obstetrics and Gynecology

## 2016-12-22 ENCOUNTER — Ambulatory Visit (INDEPENDENT_AMBULATORY_CARE_PROVIDER_SITE_OTHER): Payer: BLUE CROSS/BLUE SHIELD | Admitting: Clinical

## 2016-12-22 VITALS — BP 136/89 | HR 79 | Wt 166.1 lb

## 2016-12-22 DIAGNOSIS — Z1151 Encounter for screening for human papillomavirus (HPV): Secondary | ICD-10-CM | POA: Diagnosis not present

## 2016-12-22 DIAGNOSIS — N920 Excessive and frequent menstruation with regular cycle: Secondary | ICD-10-CM

## 2016-12-22 DIAGNOSIS — F17290 Nicotine dependence, other tobacco product, uncomplicated: Secondary | ICD-10-CM | POA: Insufficient documentation

## 2016-12-22 DIAGNOSIS — F4323 Adjustment disorder with mixed anxiety and depressed mood: Secondary | ICD-10-CM | POA: Diagnosis not present

## 2016-12-22 DIAGNOSIS — D500 Iron deficiency anemia secondary to blood loss (chronic): Secondary | ICD-10-CM

## 2016-12-22 DIAGNOSIS — N939 Abnormal uterine and vaginal bleeding, unspecified: Secondary | ICD-10-CM | POA: Insufficient documentation

## 2016-12-22 DIAGNOSIS — D250 Submucous leiomyoma of uterus: Secondary | ICD-10-CM | POA: Diagnosis not present

## 2016-12-22 DIAGNOSIS — Z124 Encounter for screening for malignant neoplasm of cervix: Secondary | ICD-10-CM

## 2016-12-22 DIAGNOSIS — D251 Intramural leiomyoma of uterus: Secondary | ICD-10-CM

## 2016-12-22 DIAGNOSIS — E785 Hyperlipidemia, unspecified: Secondary | ICD-10-CM | POA: Insufficient documentation

## 2016-12-22 DIAGNOSIS — Z1231 Encounter for screening mammogram for malignant neoplasm of breast: Secondary | ICD-10-CM

## 2016-12-22 LAB — POCT PREGNANCY, URINE: PREG TEST UR: NEGATIVE

## 2016-12-22 NOTE — BH Specialist Note (Signed)
Integrated Behavioral Health Initial Visit  MRN: [id number] Name: Tina Moss   Session Start time: 3:50 Session End time: 4:20 Total time: 30 minutes  Type of Service: [place] Interpretor:No. Interpretor Name and Language: n/a   Warm Hand Off Completed.       SUBJECTIVE: PRESLEY Moss is a 46 y.o. female accompanied by patient. Patient was referred by Dr Ilda Basset  for depression, anxiety. Patient reports the following symptoms/concerns: Pt states her primary concern is lack of appetite, worry, difficulty relaxing, all attributed to life/work stress and physical pain; open to learning strategy to cope with current pain and stress. Duration of problem: Ongoing, pain today; Severity of problem: moderate  OBJECTIVE: Mood: Anxious and Depressed and Affect: Appropriate Risk of harm to self or others: No plan to harm self or others   LIFE CONTEXT: Family and Social: Lives by herself; friends supportive School/Work: Works at [employer], Research scientist (life sciences): After work, one beer and "smoke a little", no tobacco to relax and increase appetite Life Changes: Increase in painful periods  GOALS ADDRESSED: Patient will reduce symptoms of: anxiety, depression and stress and increase knowledge and/or ability of: self-management skills and also: Increase healthy adjustment to current life circumstances   INTERVENTIONS: Motivational Interviewing, Mindfulness or Psychologist, educational and Psychoeducation and/or Health Education  Standardized Assessments completed: GAD-7 and PHQ 9  ASSESSMENT: Patient currently experiencing Adjustment with anxious and depressed mood. Patient may benefit from psychoeducation and brief therapeutic intervention regarding coping with symptoms of anxiety and depression.  PLAN: 1. Follow up with behavioral health clinician on : As needed 2. Behavioral recommendations:  -CALM relaxation breathing exercise as needed  thoughout the day -Consider apps for additional self-coping strategy -Read educational material regarding coping with anxiety an depression 3. Referral(s): [place] (In Clinic) and [employer] Resources:  [employer] 4. "From scale of 1-10, how likely are you to follow plan?": 8  Garlan Fair, LCSWA  Depression screen [facility] 2/9 12/22/2016 11/15/2016 10/13/2016 09/16/2016 11/21/2015  Decreased Interest 2 1 1  0 0  Down, Depressed, Hopeless 1 2 1  0 0  PHQ - 2 Score 3 3 2  0 0  Altered sleeping 1 1 0 0 -  Tired, decreased energy 2 1 3 2  -  Change in appetite 3 1 1  0 -  Feeling bad or failure about yourself  1 1 0 0 -  Trouble concentrating 1 2 2  0 -  Moving slowly or fidgety/restless 1 0 1 0 -  Suicidal thoughts 0 0 0 0 -  PHQ-9 Score 12 9 9 2  -  Difficult doing work/chores - - Somewhat difficult - -   GAD 7 : Generalized Anxiety Score 12/22/2016 11/15/2016 10/13/2016 09/16/2016  Nervous, Anxious, on Edge 1 1 1 1   Control/stop worrying 2 3 2 1   Worry too much - different things 3 3 3 1   Trouble relaxing 3 2 3 2   Restless 1 1 1  0  Easily annoyed or irritable 2 3 3 2   Afraid - awful might happen 2 2 1 1   Total GAD 7 Score 14 15 14 8   Anxiety Difficulty - - Somewhat difficult -

## 2016-12-22 NOTE — Telephone Encounter (Signed)
Pt referred for Gynecology from our office on 7/6 to [facility] and has appt scheduled for 7/24. Pt was also referred for the same thing to Center for [facility] and has an appt today 7/11. The Center for [place] called and said we needed to cancel the other appt since she is being seen for that diagnosis today. Is it okay to cancel? Please advise.

## 2016-12-22 NOTE — Progress Notes (Signed)
Obstetrics and Gynecology New Patient Evaluation  Appointment Date: 12/22/2016  OBGYN Clinic: Center for [facility]  Primary Care Provider: [facility] @ [place]  Referring Provider: Delia Chimes MD  Chief Complaint:  Chief Complaint  Patient presents with  . Fibroids    History of Present Illness: Tina Moss is a 46 y.o. African-American [id number] (Patient's last menstrual period was 11/23/2016.), seen for the above chief complaint. Her past medical history is significant for BMI 25, h/o seizure d/o, h/o c-section x 2, h/o BTL, HTN, h/o MS, h/o fibroids, h/o THC use.   She is seen in consultation from Dr. Nolon Rod  Patient states she's had a h/o of many years of menorrhagia. Hasn't had any treatments to help with her periods. Had an u/s back in 2015 that showed a 3cm submucosal fibroid; overall uterine size was 9cm  +climateric s/s (occasional hot flashes and night sweats and vaginal dryness).   No anemia s/s, breast s/s, fevers, chills, chest pain, SOB, nausea, vomiting, abdominal pain, dysuria, hematuria, vaginal itching, dyspareunia, diarrhea, constipation, blood in BMs  Review of Systems: as noted in the History of Present Illness.  Past Medical History:  Past Medical History:  Diagnosis Date  . Anemia   . Epilepsy ([place])   . Fibroids   . H/O bacterial infection   . H/O mumps   . Hyperlipidemia   . Seizures ([place])     Past Surgical History:  Past Surgical History:  Procedure Laterality Date  . CESAREAN SECTION     x2. at term  . TUBAL LIGATION    . WISDOM TOOTH EXTRACTION      Past Obstetrical History:  OB History  Gravida Para Term Preterm AB Living  3 2 2   1 2   SAB TAB Ectopic Multiple Live Births  1       2    # Outcome Date GA Lbr Len/2nd Weight Sex Delivery Anes PTL Lv  3 Term     U    LIV  2 Term     U    LIV  1 SAB     U    DEC    Obstetric Comments  Term c/s x 2.     Past Gynecological History: As per HPI. Periods: qmonth,  regular. Last 10 days. Very heavy and somewhat painful. Sometimes has regular period and then a few days to a week of spotting (last time was a few months ago) No. history of abnormal pap smears (last pap smear: unsure)  She is currently using BTL for contraception.  No. HRT use.   Social History:  Social History   Social History  . Marital status: Divorced    Spouse name: n/a  . Number of children: 2  . Years of education: 11th grade   Occupational History  . Housekeeper     [employer]   Social History Main Topics  . Smoking status: Current Every Day Smoker    Types: Cigars  . Smokeless tobacco: Current User     Comment: "I think I just keep so much on my mind."  . Alcohol use 0.0 - 1.8 oz/week     Comment: social  . Drug use: Yes    Types: Marijuana     Comment: seldom  . Sexual activity: Not Currently    Partners: Male   Other Topics Concern  . Not on file   Social History Narrative   Lives with her daughter.   Son is currently incarcerated in  [place].   She completed 11th grade, but was kicked out and never when back, as she had become pregnant and was raising her daughter.   Divorced x 2.    Family History:  Family History  Problem Relation Age of Onset  . Hypertension Mother   . Hypertension Sister   . Arthritis Sister        knees  . Hypertension Brother   . Deafness Brother   . Speech disorder Brother        mute  . Mental illness Brother        "he can just fly off"  . Mental illness Daughter        ? bipolar  . Scoliosis Son    She denies any female cancers, bleeding or blood clotting disorders.   Medications Ms. Gullickson had no medications administered during this visit. Current Outpatient Prescriptions  Medication Sig Dispense Refill  . cyclobenzaprine (FLEXERIL) 10 MG tablet Take 1 tablet (10 mg total) by mouth at bedtime. 30 tablet 2  . ferrous sulfate 325 (65 FE) MG tablet Take 1 tablet (325 mg total) by mouth daily with breakfast. 60 tablet 3  .  metoprolol (LOPRESSOR) 100 MG tablet Take 1 tablet (100 mg total) by mouth 2 (two) times daily. 60 tablet 3  . venlafaxine XR (EFFEXOR XR) 75 MG 24 hr capsule Take 1 capsule (75 mg total) by mouth daily with breakfast. 30 capsule 3   No current facility-administered medications for this visit.     Allergies Patient has no known allergies.   Physical Exam:  BP 136/89   Pulse 79   Wt 166 lb 1.6 oz (75.3 kg)   LMP 11/23/2016   BMI 26.01 kg/m  Body mass index is 26.01 kg/m. General appearance: Well nourished, well developed female in no acute distress.  Neck:  Supple, normal appearance, and no thyromegaly  Cardiovascular: normal s1 and s2.  No murmurs, rubs or gallops. Respiratory:  Clear to auscultation bilateral. Normal respiratory effort Abdomen: positive bowel sounds and no masses, hernias; diffusely non tender to palpation, non distended. Well healed low transverse skin incision.  Neuro/Psych:  Normal mood and affect.  Skin:  Warm and dry.  Lymphatic:  No inguinal lymphadenopathy.   Pelvic exam: is not limited by body habitus EGBUS: within normal limits, Vagina: within normal limits with mild to moderate atrophy and with no blood or discharge in the vault, Cervix: normal appearing cervix without tenderness, discharge or lesions. Uterus:  nonenlarged and non tender and Adnexa:  normal adnexa and no mass, fullness, tenderness Rectovaginal: deferred  See procedure note for uncomplicated embx  Laboratory: UPT neg, 12/2016 TSH neg CBC Latest Ref Rng & Units 12/14/2016 09/16/2016 12/03/2013  WBC 4.6 - 10.2 K/uL 3.8(A) 5.5 3.7(L)  Hemoglobin 12.2 - 16.2 g/dL 9.5(A) 9.6(L) 7.7(L)  Hematocrit 37.7 - 47.9 % 31.1(A) 31.9(L) 25.8(L)  Platelets 150 - 379 x10E3/uL - 261 241   Radiology:  CLINICAL DATA:  Left lower quadrant pain, suspect ovarian torsion, 2 week history vaginal bleeding  EXAM: TRANSABDOMINAL AND TRANSVAGINAL ULTRASOUND OF PELVIS  DOPPLER ULTRASOUND OF  OVARIES  TECHNIQUE: Both transabdominal and transvaginal ultrasound examinations of the pelvis were performed. Transabdominal technique was performed for global imaging of the pelvis including uterus, ovaries, adnexal regions, and pelvic cul-de-sac.  It was necessary to proceed with endovaginal exam following the transabdominal exam to visualize the uterus and adnexal structures. Color and duplex Doppler ultrasound was utilized to evaluate blood flow to the ovaries.  COMPARISON:  None.  FINDINGS: Uterus  Measurements: 9.2 x 5.4 x 6.0 cm. There are at least 2 fibroids present. One lies anteriorly in the fundus and is subendometrial and measures 3.7 x 3.3 x 2.8 cm. In the lower uterine segment posteriorly there is a fibroid measuring 2 x 1.1 x 1.4 cm.  Endometrium  Thickness: 5.2 mm.  No focal abnormality visualized.  Right ovary  Measurements: 3.5 x 1 1.2 x 1.3 cm. Normal appearance/no adnexal mass.  Left ovary  Measurements: 2.7 x 1.3 x 1.4 cm. Normal appearance/no adnexal mass.  Pulsed Doppler evaluation of both ovaries demonstrates normal low-resistance arterial and venous waveforms.  Other findings  No free fluid.  IMPRESSION: 1. The ovaries are normal in echotexture and contour and vascularity. There are no findings to suggest torsion. 2. There are at least 2 fibroids within the uterus as described. The endometrial stripe is not abnormally thickened, but within the uterine fundus the subendometrial fibroid distorts the endometrial stripe.   Electronically Signed   By: David  Martinique   On: 09/25/2013 09:53  Assessment: pt doing well  Plan:  1. Visit for screening mammogram - MM Digital Screening; Future  2. Menorrhagia with regular cycle Pap and embx done today. Early cycle TVUS ordered. Briefly d/w pt re: medical and surgical options. Will pin down exact plan once lab and u/s is back.  - US Transvaginal Non-OB; Future - US Pelvis  Complete; Future  RTC PRN. Will call pt with results  Durene Romans MD Attending Center for [facility])

## 2016-12-22 NOTE — Progress Notes (Signed)
Screening mammogram scheduled for July 17th @ 1500 @ [facility].  [place] scheduled for July 19th @ 1500.  Pt notified.

## 2016-12-22 NOTE — Telephone Encounter (Signed)
Spoke with [facility] and pt appointment cancelled for 01/04/17 as pt was seen on 12/22/16 at [place] [facility] for referral dx.  Ok to cancel per Dr. Nolon Rod. dg

## 2016-12-22 NOTE — Procedures (Signed)
Endometrial Biopsy Procedure Note  Pre-operative Diagnosis: AUB.  Post-operative Diagnosis: same. Vulvovaginal atrophy  Procedure Details  Urine pregnancy test was done and result was negative.  The risks (including infection, bleeding, pain, and uterine perforation) and benefits of the procedure were explained to the patient and Written informed consent was obtained.  The patient was placed in the dorsal lithotomy position.  Bimanual exam showed the uterus to be in the neutral position.  A Graves' speculum inserted in the vagina, and the cervix was visualized and a pap smear performed. The cervix was then prepped with povidone iodine, and a sharp tenaculum was applied to the anterior lip of the cervix for stabilization.  A pipelle was inserted into the uterine cavity and sounded the uterus to a depth of 7cm.  A minimal to small amount of tissue was collected after 2 passes. The sample was sent for pathologic examination.  Condition: Stable  Complications: None  Plan: The patient was advised to call for any fever or for prolonged or severe pain or bleeding. She was advised to use OTC analgesics as needed for mild to moderate pain. She was advised to avoid vaginal intercourse for 48 hours or until the bleeding has completely stopped.  Durene Romans MD Attending Center for [facility])

## 2016-12-24 LAB — CYTOLOGY - PAP
DIAGNOSIS: NEGATIVE
HPV (WINDOPATH): NOT DETECTED

## 2016-12-28 ENCOUNTER — Ambulatory Visit
Admission: RE | Admit: 2016-12-28 | Discharge: 2016-12-28 | Disposition: A | Discharge status: Home / Self Care | Payer: BLUE CROSS/BLUE SHIELD | Source: Ambulatory Visit | Attending: Obstetrics and Gynecology | Admitting: Obstetrics and Gynecology

## 2016-12-28 DIAGNOSIS — Z1231 Encounter for screening mammogram for malignant neoplasm of breast: Secondary | ICD-10-CM

## 2016-12-30 ENCOUNTER — Ambulatory Visit (HOSPITAL_COMMUNITY)
Admission: RE | Admit: 2016-12-30 | Discharge: 2016-12-30 | Disposition: A | Discharge status: Home / Self Care | Payer: BLUE CROSS/BLUE SHIELD | Source: Ambulatory Visit | Attending: Obstetrics and Gynecology | Admitting: Obstetrics and Gynecology

## 2016-12-30 DIAGNOSIS — N920 Excessive and frequent menstruation with regular cycle: Secondary | ICD-10-CM | POA: Insufficient documentation

## 2016-12-30 DIAGNOSIS — D259 Leiomyoma of uterus, unspecified: Secondary | ICD-10-CM | POA: Diagnosis not present

## 2017-01-04 ENCOUNTER — Ambulatory Visit: Payer: BLUE CROSS/BLUE SHIELD | Admitting: Obstetrics & Gynecology

## 2017-01-06 ENCOUNTER — Telehealth: Payer: Self-pay | Admitting: Obstetrics and Gynecology

## 2017-01-06 MED ORDER — MEDROXYPROGESTERONE ACETATE 150 MG/ML IM SUSP: 150.0000 mg | INTRAMUSCULAR | 3 refills | Status: DC

## 2017-01-06 NOTE — Telephone Encounter (Signed)
GYN Telephone Note  D/w her negative pap and embx and u/s showing 3-4cm submucosal firoid. Options d/w her and she'd like to avoid surgery and do medical tx with depo provera. I told her the only downside is that it may not work and I'd recommend hysteroscopic myomectomy over medical management but no harm in trying medical tx first. Will send in depo provera and have her see me back in 59m.  Pt amenable to plan.   Durene Romans MD Attending Center for [facility] (Faculty Practice) 01/06/2017 Time: 1017am

## 2017-01-17 ENCOUNTER — Ambulatory Visit: Payer: BLUE CROSS/BLUE SHIELD | Admitting: Physician Assistant

## 2017-02-01 ENCOUNTER — Other Ambulatory Visit: Payer: Self-pay

## 2017-02-01 NOTE — Progress Notes (Signed)
Pt called the front office in regards to scheduling Depo injection appt.  Looking at pt's chart, Dr. Ilda Basset sent Rx for Depo Provera 150 mg to pt's pharmacy to pick up and bring to her nurse visit.  Dr.Pickens note indicates that pt has had a BTL, pt confirms.  Pt questioned when to schedule her Depo appt. Because she has not had a period in a couple months.  I explained to the pt that Depo Provera is used to manage irregular periods so the start time of her period should not stop her from scheduling appt.  Pt stated understanding with no further questions.

## 2017-02-01 NOTE — Telephone Encounter (Signed)
Opened in error

## 2017-02-02 ENCOUNTER — Ambulatory Visit: Payer: BLUE CROSS/BLUE SHIELD | Admitting: Physician Assistant

## 2017-02-02 NOTE — Progress Notes (Deleted)
Cardiology Office Note    Date:  02/02/2017   ID:  Tina Moss, DOB [birth year], MRN [id number]  PCP:  Arnoldo Morale, MD  Cardiologist:  ***   No chief complaint on file.   History of Present Illness:  Tina Moss is a 46 y.o. female with PMH of HLD and HTN presents today for eval of chest pain and palpitation.     Past Medical History:  Diagnosis Date  . Anemia   . Epilepsy ([place])   . Fibroids   . H/O bacterial infection   . H/O mumps   . Hyperlipidemia   . Seizures ([place])     Past Surgical History:  Procedure Laterality Date  . CESAREAN SECTION     x2. at term  . TUBAL LIGATION    . WISDOM TOOTH EXTRACTION      Current Medications: Outpatient Medications Prior to Visit  Medication Sig Dispense Refill  . cyclobenzaprine (FLEXERIL) 10 MG tablet Take 1 tablet (10 mg total) by mouth at bedtime. 30 tablet 2  . ferrous sulfate 325 (65 FE) MG tablet Take 1 tablet (325 mg total) by mouth daily with breakfast. 60 tablet 3  . medroxyPROGESTERone (DEPO-PROVERA) 150 MG/ML injection Inject 1 mL (150 mg total) into the muscle every 3 (three) months. 1 mL 3  . metoprolol (LOPRESSOR) 100 MG tablet Take 1 tablet (100 mg total) by mouth 2 (two) times daily. 60 tablet 3  . venlafaxine XR (EFFEXOR XR) 75 MG 24 hr capsule Take 1 capsule (75 mg total) by mouth daily with breakfast. 30 capsule 3   No facility-administered medications prior to visit.      Allergies:   Patient has no known allergies.   Social History   Social History  . Marital status: Divorced    Spouse name: n/a  . Number of children: 2  . Years of education: 11th grade   Occupational History  . Housekeeper     [employer]   Social History Main Topics  . Smoking status: Current Every Day Smoker    Types: Cigars  . Smokeless tobacco: Current User     Comment: "I think I just keep so much on my mind."  . Alcohol use 0.0 - 1.8 oz/week     Comment: social  . Drug use: Yes    Types: Marijuana   Comment: seldom  . Sexual activity: Not Currently    Partners: Male   Other Topics Concern  . Not on file   Social History Narrative   Lives with her daughter.   Son is currently incarcerated in [place].   She completed 11th grade, but was kicked out and never when back, as she had become pregnant and was raising her daughter.   Divorced x 2.     Family History:  The patient's ***family history includes Arthritis in her sister; Deafness in her brother; Hypertension in her brother, mother, and sister; Mental illness in her brother and daughter; Scoliosis in her son; Speech disorder in her brother.   ROS:   Please see the history of present illness.    ROS All other systems reviewed and are negative.   PHYSICAL EXAM:   VS:  There were no vitals taken for this visit.   GEN: Well nourished, well developed, in no acute distress  HEENT: normal  Neck: no JVD, carotid bruits, or masses Cardiac: ***RRR; no murmurs, rubs, or gallops,no edema  Respiratory:  clear to auscultation bilaterally, normal work of breathing GI: soft,  nontender, nondistended, + BS MS: no deformity or atrophy  Skin: warm and dry, no rash Neuro:  Alert and Oriented x 3, Strength and sensation are intact Psych: euthymic mood, full affect  Wt Readings from Last 3 Encounters:  12/22/16 166 lb 1.6 oz (75.3 kg)  12/14/16 162 lb 3.2 oz (73.6 kg)  11/15/16 167 lb (75.8 kg)      Studies/Labs Reviewed:   EKG:  EKG is*** ordered today.  The ekg ordered today demonstrates ***  Recent Labs: 09/16/2016: Platelets 261 10/13/2016: ALT 17; BUN 11; Creatinine, Ser 0.58; Potassium 3.6; Sodium 141 12/14/2016: Hemoglobin 9.5; TSH 0.989   Lipid Panel No results found for: CHOL, TRIG, HDL, CHOLHDL, VLDL, LDLCALC, LDLDIRECT  Additional studies/ records that were reviewed today include:  ***    ASSESSMENT:    No diagnosis found.   PLAN:  In order of problems listed above:  1. ***    Medication Adjustments/Labs and Tests  Ordered: Current medicines are reviewed at length with the patient today.  Concerns regarding medicines are outlined above.  Medication changes, Labs and Tests ordered today are listed in the Patient Instructions below. There are no Patient Instructions on file for this visit.   Hilbert Corrigan, [place]  02/02/2017 1:52 PM    [facility] [place], [place], [place]  [address] Phone: [phone]; Fax: [phone]

## 2017-02-16 ENCOUNTER — Encounter: Payer: Self-pay | Admitting: Neurology

## 2017-02-16 ENCOUNTER — Ambulatory Visit (INDEPENDENT_AMBULATORY_CARE_PROVIDER_SITE_OTHER): Payer: BLUE CROSS/BLUE SHIELD | Admitting: Neurology

## 2017-02-16 ENCOUNTER — Other Ambulatory Visit: Payer: BLUE CROSS/BLUE SHIELD

## 2017-02-16 VITALS — BP 128/86 | HR 100 | Ht 67.0 in | Wt 160.7 lb

## 2017-02-16 DIAGNOSIS — Z8669 Personal history of other diseases of the nervous system and sense organs: Secondary | ICD-10-CM

## 2017-02-16 DIAGNOSIS — H538 Other visual disturbances: Secondary | ICD-10-CM | POA: Diagnosis not present

## 2017-02-16 DIAGNOSIS — R930 Abnormal findings on diagnostic imaging of skull and head, not elsewhere classified: Secondary | ICD-10-CM

## 2017-02-16 DIAGNOSIS — R413 Other amnesia: Secondary | ICD-10-CM | POA: Diagnosis not present

## 2017-02-16 DIAGNOSIS — R9082 White matter disease, unspecified: Secondary | ICD-10-CM

## 2017-02-16 DIAGNOSIS — F17200 Nicotine dependence, unspecified, uncomplicated: Secondary | ICD-10-CM | POA: Diagnosis not present

## 2017-02-16 NOTE — Patient Instructions (Addendum)
1.  We will check MRI of brain, cervical and thoracic cord with and without contrast.  We may need to perform a spinal tap as well. We have sent a referral to [facility] for your MRI and they will call you directly to schedule your appt. They are located at [place]. If you need to contact them directly please call [phone].  2.  We will check a routine EEG  3.  We will order neurocognitive testing with Tina Moss  Patients age 47 and under:  You have been referred for a neurocognitive evaluation in our office.   The evaluation consists of three appointments.   1. The first appointment is about 45 minutes and is a clinical interview with the neuropsychologist (Tina Moss). You can bring someone with you to this appointment, as it is helpful for Tina Moss to hear from both you and another adult who knows you well.   2. The second appointment is 2-3 hours long and is with the psychometrician Tina Moss). You will complete a variety of tasks- mostly question-and-answer, some paper-and-pencil, some on the computer. There is nothing you need to do to prepare for this appointment, but having a good night's sleep prior to the testing, and bringing eyeglasses and hearing aids (if you wear them), is advised.   3. The final appointment is a follow-up with Tina Moss where she will go over the test results with you and provide recommendations. This appointment is about 30 minutes.  If you would like a family member to receive this information as well, please bring them to the appointment.   We have to reserve several hours of the neuropsychologist's time and the psychometrician's time for your appointment. As such, please note that there is a No-Show fee of $100. If you are unable to attend any of your appointments, please contact our office as soon as possible to reschedule.  4.  I will refer you for eye exam. We will call you with an appt.   5.  We will check ANA,  sed rate, and B12. Your provider has requested that you have labwork completed today. Please go to [facility] ([id number]) on the second floor of this building before leaving the office today. You do not need to check in. If you are not called within 15 minutes please check with the front desk.   6.  Follow up after testing.
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
## 2017-02-16 NOTE — Addendum Note (Signed)
Addended by: Clois Comber on: 02/16/2017 01:50 PM   Modules accepted: Orders

## 2017-02-17 LAB — ANTI-NUCLEAR AB-TITER (ANA TITER)

## 2017-02-17 LAB — SEDIMENTATION RATE: SED RATE: 6 mm/h (ref 0–20)

## 2017-02-17 LAB — VITAMIN B12: Vitamin B-12: 551 pg/mL (ref 200–1100)

## 2017-02-17 LAB — ANA: Anti Nuclear Antibody(ANA): POSITIVE — AB

## 2017-02-18 ENCOUNTER — Ambulatory Visit: Payer: BLUE CROSS/BLUE SHIELD | Admitting: Family Medicine

## 2017-02-21 ENCOUNTER — Ambulatory Visit: Payer: BLUE CROSS/BLUE SHIELD | Admitting: Family Medicine

## 2017-02-22 ENCOUNTER — Telehealth: Payer: Self-pay

## 2017-02-22 NOTE — Telephone Encounter (Signed)
-----   Message from Pieter Partridge, DO sent at 02/21/2017 12:38 PM EDT ----- Blood tests show a positive ANA, which is nonspecific finding.  It may suggest an autoimmune disease (such as MS), however it is only mildly positive and may be of no significance.  I would like to follow up with a couple of other blood tests:  RF, Sjogrens (SSA and SSB antibodies), and ANCA

## 2017-02-22 NOTE — Telephone Encounter (Signed)
Called Pt, no answer, LM for her to call me back

## 2017-02-23 NOTE — Telephone Encounter (Signed)
Attempted to reach Pt again, LM for rtrn call

## 2017-02-24 ENCOUNTER — Other Ambulatory Visit: Payer: Self-pay

## 2017-03-02 ENCOUNTER — Ambulatory Visit (INDEPENDENT_AMBULATORY_CARE_PROVIDER_SITE_OTHER): Payer: BLUE CROSS/BLUE SHIELD | Admitting: Neurology

## 2017-03-02 ENCOUNTER — Encounter: Payer: Self-pay | Admitting: Neurology

## 2017-03-02 DIAGNOSIS — G40909 Epilepsy, unspecified, not intractable, without status epilepticus: Secondary | ICD-10-CM

## 2017-03-03 NOTE — Progress Notes (Signed)
ELECTROENCEPHALOGRAM REPORT  Date of Study: 03/02/2017  Patient's Name: Tina Moss MRN: [id number] Date of Birth: 1971/03/18  Clinical History: 46 year old female with history of seizures  Medications: Effexor Lopressor Depo-Provera Flexeril  Technical Summary: A multichannel digital EEG recording measured by the international 10-20 system with electrodes applied with paste and impedances below 5000 ohms performed in our laboratory with EKG monitoring in an awake and asleep patient.  Hyperventilation and photic stimulation were performed.  The digital EEG was referentially recorded, reformatted, and digitally filtered in a variety of bipolar and referential montages for optimal display.    Description: The patient is awake and asleep during the recording.  During maximal wakefulness, there is a symmetric, medium voltage 10 Hz posterior dominant rhythm that attenuates with eye opening.  The record is symmetric.  During drowsiness and sleep, there is an increase in theta slowing of the background.  Vertex waves and symmetric sleep spindles were seen.  Hyperventilation and photic stimulation did not elicit any abnormalities.  There were no epileptiform discharges or electrographic seizures seen.    EKG lead was unremarkable.  Impression: This awake and asleep EEG is normal.    Clinical Correlation: A normal EEG does not exclude a clinical diagnosis of epilepsy.  If further clinical questions remain, prolonged EEG may be helpful.  Clinical correlation is advised.   Metta Clines, DO

## 2017-03-10 ENCOUNTER — Ambulatory Visit
Admission: RE | Admit: 2017-03-10 | Discharge: 2017-03-10 | Disposition: A | Discharge status: Home / Self Care | Payer: BLUE CROSS/BLUE SHIELD | Source: Ambulatory Visit | Attending: Neurology | Admitting: Neurology

## 2017-03-10 ENCOUNTER — Ambulatory Visit: Payer: BLUE CROSS/BLUE SHIELD | Admitting: Neurology

## 2017-03-10 DIAGNOSIS — R9082 White matter disease, unspecified: Secondary | ICD-10-CM

## 2017-03-10 DIAGNOSIS — R413 Other amnesia: Secondary | ICD-10-CM

## 2017-03-10 DIAGNOSIS — Z8669 Personal history of other diseases of the nervous system and sense organs: Secondary | ICD-10-CM

## 2017-03-10 MED ORDER — GADOBENATE DIMEGLUMINE 529 MG/ML IV SOLN
15.0000 mL | Freq: Once | INTRAVENOUS | Status: AC | PRN
  Administered 2017-03-10: 15 mL via INTRAVENOUS

## 2017-03-11 ENCOUNTER — Telehealth: Payer: Self-pay

## 2017-03-11 NOTE — Telephone Encounter (Signed)
LM for Pt to rtrn my call

## 2017-03-11 NOTE — Telephone Encounter (Signed)
Attempted to reach Pt again, LM for rtrn call

## 2017-03-11 NOTE — Telephone Encounter (Signed)
-----   Message from Pieter Partridge, DO sent at 03/11/2017  7:13 AM EDT ----- While MRI of brain shows spots, it is not specific for MS.  It may be due to her other medical conditions such as high blood pressure and smoking history.  The spinal cord looks okay.  I would like to proceed with lumbar puncture for CSF analysis:  Cell count, protein, glucose, gram stain and culture, cytology, IgG index, and oligoclonal bands.

## 2017-03-29 ENCOUNTER — Encounter: Payer: Self-pay | Admitting: Family Medicine

## 2017-03-29 ENCOUNTER — Ambulatory Visit (INDEPENDENT_AMBULATORY_CARE_PROVIDER_SITE_OTHER): Payer: BLUE CROSS/BLUE SHIELD

## 2017-03-29 ENCOUNTER — Ambulatory Visit (INDEPENDENT_AMBULATORY_CARE_PROVIDER_SITE_OTHER): Payer: BLUE CROSS/BLUE SHIELD | Admitting: Family Medicine

## 2017-03-29 VITALS — BP 138/86 | HR 88 | Temp 98.6°F | Resp 18 | Ht 67.0 in | Wt 167.4 lb

## 2017-03-29 DIAGNOSIS — M546 Pain in thoracic spine: Secondary | ICD-10-CM | POA: Diagnosis not present
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
## 2017-03-29 NOTE — Patient Instructions (Signed)
     IF you received an x-ray today, you will receive an invoice from [facility]. Please contact [facility] at [phone] with questions or concerns regarding your invoice.   IF you received labwork today, you will receive an invoice from [facility]. Please contact [facility] at [phone] with questions or concerns regarding your invoice.   Our billing staff will not be able to assist you with questions regarding bills from these companies.  You will be contacted with the lab results as soon as they are available. The fastest way to get your results is to activate your My Chart account. Instructions are located on the last page of this paperwork. If you have not heard from us regarding the results in 2 weeks, please contact this office.     

## 2017-05-25 ENCOUNTER — Emergency Department (HOSPITAL_COMMUNITY): Payer: No Typology Code available for payment source

## 2017-05-25 ENCOUNTER — Other Ambulatory Visit: Payer: Self-pay

## 2017-05-25 ENCOUNTER — Emergency Department (HOSPITAL_COMMUNITY)
Admission: EM | Admit: 2017-05-25 | Discharge: 2017-05-25 | Disposition: A | Discharge status: Home / Self Care | Payer: No Typology Code available for payment source | Attending: Emergency Medicine | Admitting: Emergency Medicine

## 2017-05-25 ENCOUNTER — Encounter (HOSPITAL_COMMUNITY): Payer: Self-pay | Admitting: *Deleted

## 2017-05-25 DIAGNOSIS — I10 Essential (primary) hypertension: Secondary | ICD-10-CM | POA: Insufficient documentation

## 2017-05-25 DIAGNOSIS — Y92410 Unspecified street and highway as the place of occurrence of the external cause: Secondary | ICD-10-CM | POA: Diagnosis not present

## 2017-05-25 DIAGNOSIS — F17210 Nicotine dependence, cigarettes, uncomplicated: Secondary | ICD-10-CM | POA: Insufficient documentation

## 2017-05-25 DIAGNOSIS — Y999 Unspecified external cause status: Secondary | ICD-10-CM | POA: Insufficient documentation

## 2017-05-25 DIAGNOSIS — Y9389 Activity, other specified: Secondary | ICD-10-CM | POA: Diagnosis not present

## 2017-05-25 DIAGNOSIS — M545 Low back pain, unspecified: Secondary | ICD-10-CM

## 2017-05-25 DIAGNOSIS — Z79899 Other long term (current) drug therapy: Secondary | ICD-10-CM | POA: Insufficient documentation

## 2017-05-25 MED ORDER — IBUPROFEN 400 MG PO TABS
600.0000 mg | ORAL_TABLET | Freq: Once | ORAL | Status: AC
  Administered 2017-05-25: 600 mg via ORAL
  Filled 2017-05-25: qty 1
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
## 2017-05-25 NOTE — Discharge Instructions (Signed)
Please read attached information. If you experience any new or worsening signs or symptoms please return to the emergency room for evaluation. Please follow-up with your primary care provider or specialist as discussed.  °

## 2017-05-25 NOTE — ED Notes (Signed)
Patient transported to X-ray 
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
## 2017-07-05 ENCOUNTER — Encounter: Payer: Self-pay | Admitting: Psychology

## 2017-07-26 ENCOUNTER — Encounter: Payer: Self-pay | Admitting: Psychology

## 2017-08-02 ENCOUNTER — Ambulatory Visit: Payer: Self-pay | Admitting: Neurology

## 2017-08-21 ENCOUNTER — Encounter (HOSPITAL_COMMUNITY): Payer: Self-pay | Admitting: Nurse Practitioner

## 2017-08-21 ENCOUNTER — Emergency Department (HOSPITAL_COMMUNITY)
Admission: EM | Admit: 2017-08-21 | Discharge: 2017-08-21 | Disposition: A | Discharge status: Home / Self Care | Payer: Self-pay | Attending: Emergency Medicine | Admitting: Emergency Medicine

## 2017-08-21 DIAGNOSIS — F17210 Nicotine dependence, cigarettes, uncomplicated: Secondary | ICD-10-CM | POA: Insufficient documentation

## 2017-08-21 DIAGNOSIS — F10129 Alcohol abuse with intoxication, unspecified: Secondary | ICD-10-CM | POA: Insufficient documentation

## 2017-08-21 DIAGNOSIS — R51 Headache: Secondary | ICD-10-CM | POA: Insufficient documentation

## 2017-08-21 DIAGNOSIS — Z79899 Other long term (current) drug therapy: Secondary | ICD-10-CM | POA: Insufficient documentation

## 2017-08-21 DIAGNOSIS — F10920 Alcohol use, unspecified with intoxication, uncomplicated: Secondary | ICD-10-CM

## 2017-08-21 DIAGNOSIS — I10 Essential (primary) hypertension: Secondary | ICD-10-CM | POA: Insufficient documentation

## 2017-08-21 DIAGNOSIS — R569 Unspecified convulsions: Secondary | ICD-10-CM | POA: Insufficient documentation

## 2017-08-21 LAB — COMPREHENSIVE METABOLIC PANEL
ALT: 23 U/L (ref 14–54)
ANION GAP: 7 (ref 5–15)
AST: 22 U/L (ref 15–41)
Albumin: 4.3 g/dL (ref 3.5–5.0)
Alkaline Phosphatase: 115 U/L (ref 38–126)
BILIRUBIN TOTAL: 0.5 mg/dL (ref 0.3–1.2)
BUN: 10 mg/dL (ref 6–20)
CHLORIDE: 109 mmol/L (ref 101–111)
CO2: 27 mmol/L (ref 22–32)
Calcium: 9.4 mg/dL (ref 8.9–10.3)
Creatinine, Ser: 0.61 mg/dL (ref 0.44–1.00)
Glucose, Bld: 99 mg/dL (ref 65–99)
POTASSIUM: 3.8 mmol/L (ref 3.5–5.1)
Sodium: 143 mmol/L (ref 135–145)
TOTAL PROTEIN: 8.5 g/dL — AB (ref 6.5–8.1)

## 2017-08-21 LAB — RAPID URINE DRUG SCREEN, HOSP PERFORMED
AMPHETAMINES: NOT DETECTED
Barbiturates: NOT DETECTED
Benzodiazepines: NOT DETECTED
COCAINE: NOT DETECTED
OPIATES: NOT DETECTED
TETRAHYDROCANNABINOL: NOT DETECTED

## 2017-08-21 LAB — URINALYSIS, ROUTINE W REFLEX MICROSCOPIC
Bilirubin Urine: NEGATIVE
Glucose, UA: NEGATIVE mg/dL
HGB URINE DIPSTICK: NEGATIVE
Ketones, ur: NEGATIVE mg/dL
Leukocytes, UA: NEGATIVE
NITRITE: NEGATIVE
PROTEIN: NEGATIVE mg/dL
SPECIFIC GRAVITY, URINE: 1.012 (ref 1.005–1.030)
pH: 5 (ref 5.0–8.0)

## 2017-08-21 LAB — ETHANOL: ALCOHOL ETHYL (B): 220 mg/dL — AB (ref <10)
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
## 2017-08-22 LAB — CBC WITH DIFFERENTIAL/PLATELET
Basophils Absolute: 0.1 10*3/uL (ref 0.0–0.1)
Basophils Relative: 1 %
EOS ABS: 0 10*3/uL (ref 0.0–0.7)
EOS PCT: 1 %
HCT: 37.5 % (ref 36.0–46.0)
Hemoglobin: 12 g/dL (ref 12.0–15.0)
LYMPHS ABS: 1.5 10*3/uL (ref 0.7–4.0)
LYMPHS PCT: 42 %
MCH: 24.5 pg — AB (ref 26.0–34.0)
MCHC: 32 g/dL (ref 30.0–36.0)
MCV: 76.5 fL — AB (ref 78.0–100.0)
Monocytes Absolute: 0.2 10*3/uL (ref 0.1–1.0)
Monocytes Relative: 5 %
Neutro Abs: 1.8 10*3/uL (ref 1.7–7.7)
Neutrophils Relative %: 51 %
PLATELETS: 321 10*3/uL (ref 150–400)
RBC: 4.9 MIL/uL (ref 3.87–5.11)
RDW: 22.6 % — AB (ref 11.5–15.5)
WBC: 3.5 10*3/uL — AB (ref 4.0–10.5)

## 2018-03-19 IMAGING — MG DIGITAL SCREENING BILATERAL MAMMOGRAM WITH CAD
4 series · 4 of 4 positions shown · non-contrast
Comparison: None.

CLINICAL DATA: Screening.

EXAM:
DIGITAL SCREENING BILATERAL MAMMOGRAM WITH CAD

[L MLO]
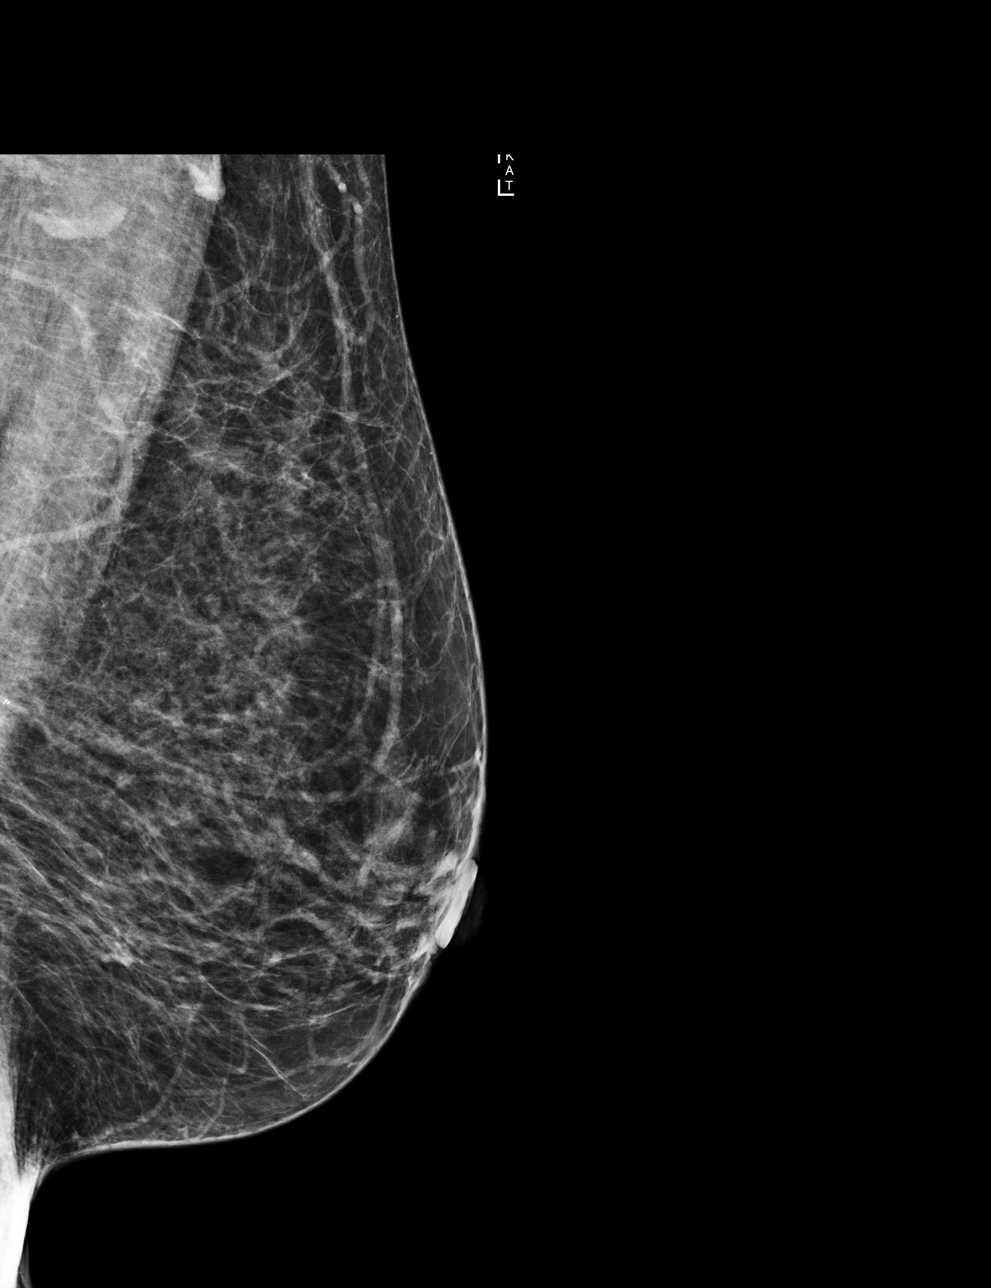

[R MLO]
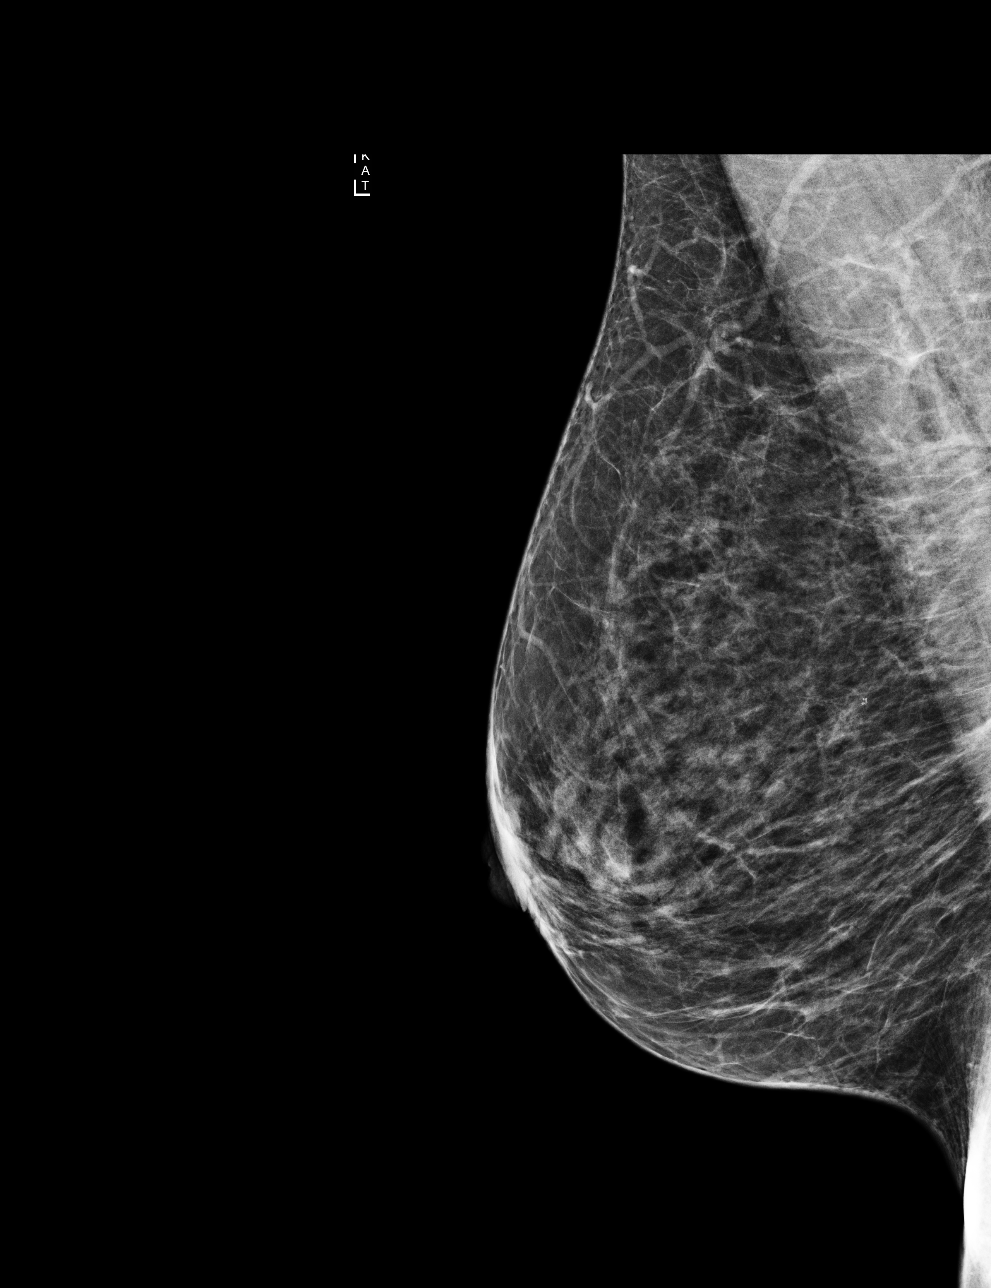

[L CC]
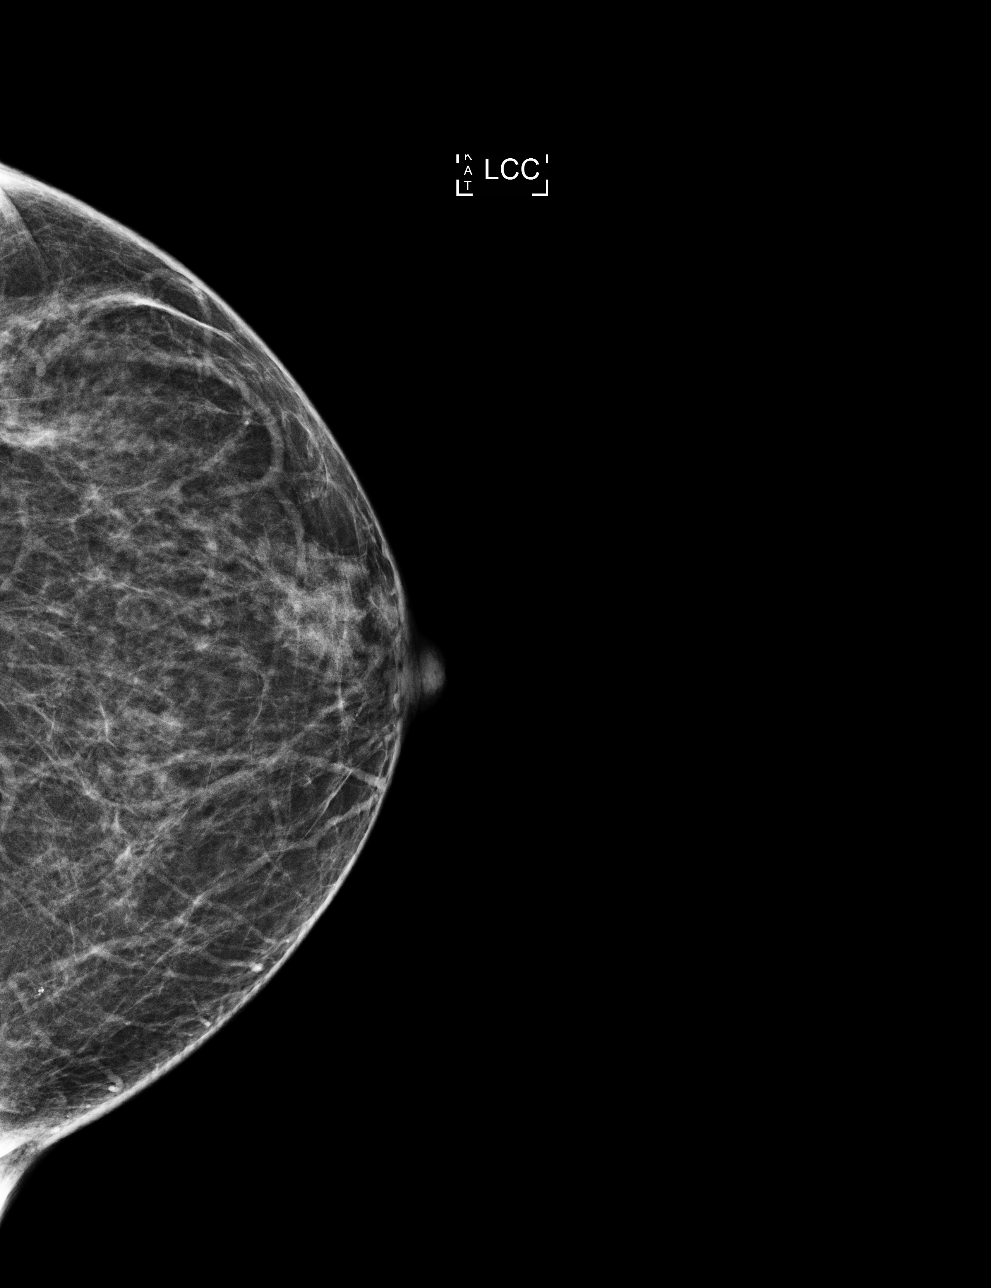

[R CC]
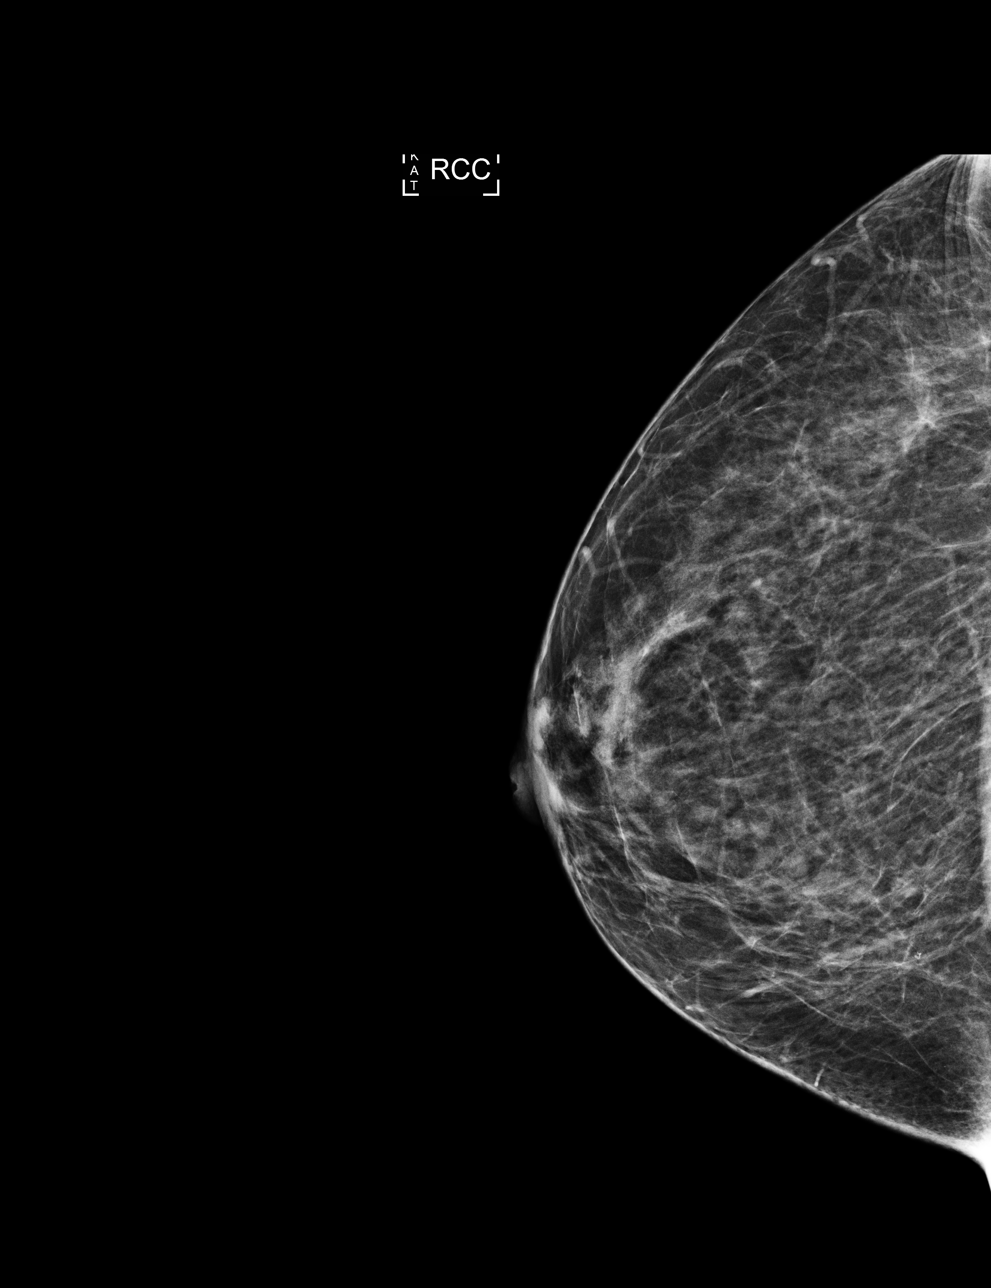

[4 of 4 positions shown; findings below may reference images not displayed]

ACR Breast Density Category b: There are scattered areas of
fibroglandular density.
FINDINGS: There are no findings suspicious for malignancy. Images were
processed with CAD.
IMPRESSION: No mammographic evidence of malignancy. A result letter of this
screening mammogram will be mailed directly to the patient.

RECOMMENDATION:
Screening mammogram in one year. (Code:[id number]-[id number])

BI-RADS CATEGORY  1: Negative.

## 2018-04-04 ENCOUNTER — Other Ambulatory Visit: Payer: Self-pay

## 2018-04-04 ENCOUNTER — Inpatient Hospital Stay (HOSPITAL_COMMUNITY): Payer: Self-pay

## 2018-04-04 ENCOUNTER — Emergency Department (HOSPITAL_COMMUNITY): Payer: Self-pay

## 2018-04-04 ENCOUNTER — Encounter (HOSPITAL_COMMUNITY): Payer: Self-pay

## 2018-04-04 ENCOUNTER — Inpatient Hospital Stay (HOSPITAL_COMMUNITY)
Admission: EM | Admit: 2018-04-04 | Discharge: 2018-04-07 | DRG: 065 | Disposition: A | Discharge status: Rehabilitation Facility | Payer: Self-pay | Attending: Internal Medicine | Admitting: Internal Medicine

## 2018-04-04 DIAGNOSIS — I10 Essential (primary) hypertension: Secondary | ICD-10-CM | POA: Diagnosis present

## 2018-04-04 DIAGNOSIS — F17290 Nicotine dependence, other tobacco product, uncomplicated: Secondary | ICD-10-CM | POA: Diagnosis present

## 2018-04-04 DIAGNOSIS — D649 Anemia, unspecified: Secondary | ICD-10-CM | POA: Diagnosis present

## 2018-04-04 DIAGNOSIS — G40909 Epilepsy, unspecified, not intractable, without status epilepticus: Secondary | ICD-10-CM

## 2018-04-04 DIAGNOSIS — R29703 NIHSS score 3: Secondary | ICD-10-CM | POA: Diagnosis present

## 2018-04-04 DIAGNOSIS — R471 Dysarthria and anarthria: Secondary | ICD-10-CM | POA: Diagnosis present

## 2018-04-04 DIAGNOSIS — D509 Iron deficiency anemia, unspecified: Secondary | ICD-10-CM | POA: Diagnosis present

## 2018-04-04 DIAGNOSIS — F32A Depression, unspecified: Secondary | ICD-10-CM | POA: Diagnosis present

## 2018-04-04 DIAGNOSIS — D72819 Decreased white blood cell count, unspecified: Secondary | ICD-10-CM | POA: Diagnosis present

## 2018-04-04 DIAGNOSIS — G8191 Hemiplegia, unspecified affecting right dominant side: Secondary | ICD-10-CM | POA: Diagnosis present

## 2018-04-04 DIAGNOSIS — R4701 Aphasia: Secondary | ICD-10-CM | POA: Diagnosis present

## 2018-04-04 DIAGNOSIS — Z79899 Other long term (current) drug therapy: Secondary | ICD-10-CM

## 2018-04-04 DIAGNOSIS — G35 Multiple sclerosis: Secondary | ICD-10-CM | POA: Diagnosis present

## 2018-04-04 DIAGNOSIS — Z720 Tobacco use: Secondary | ICD-10-CM | POA: Diagnosis present

## 2018-04-04 DIAGNOSIS — F329 Major depressive disorder, single episode, unspecified: Secondary | ICD-10-CM | POA: Diagnosis present

## 2018-04-04 DIAGNOSIS — E785 Hyperlipidemia, unspecified: Secondary | ICD-10-CM | POA: Diagnosis present

## 2018-04-04 DIAGNOSIS — I63412 Cerebral infarction due to embolism of left middle cerebral artery: Principal | ICD-10-CM | POA: Diagnosis present

## 2018-04-04 DIAGNOSIS — I639 Cerebral infarction, unspecified: Secondary | ICD-10-CM | POA: Diagnosis present

## 2018-04-04 HISTORY — DX: Essential (primary) hypertension: I10

## 2018-04-04 LAB — RETICULOCYTES
Immature Retic Fract: 18.3 % — ABNORMAL HIGH (ref 2.3–15.9)
RBC.: 3.82 MIL/uL — AB (ref 3.87–5.11)
Retic Count, Absolute: 57.3 10*3/uL (ref 19.0–186.0)
Retic Ct Pct: 1.5 % (ref 0.4–3.1)

## 2018-04-04 LAB — CBC
HEMATOCRIT: 28.1 % — AB (ref 36.0–46.0)
Hemoglobin: 8.2 g/dL — ABNORMAL LOW (ref 12.0–15.0)
MCH: 21.3 pg — ABNORMAL LOW (ref 26.0–34.0)
MCHC: 29.2 g/dL — AB (ref 30.0–36.0)
MCV: 73 fL — ABNORMAL LOW (ref 80.0–100.0)
Platelets: 254 10*3/uL (ref 150–400)
RBC: 3.85 MIL/uL — ABNORMAL LOW (ref 3.87–5.11)
RDW: 23.4 % — AB (ref 11.5–15.5)
WBC: 3.3 10*3/uL — AB (ref 4.0–10.5)
nRBC: 0 % (ref 0.0–0.2)

## 2018-04-04 LAB — COMPREHENSIVE METABOLIC PANEL
ALK PHOS: 76 U/L (ref 38–126)
ALT: 19 U/L (ref 0–44)
AST: 26 U/L (ref 15–41)
Albumin: 3.3 g/dL — ABNORMAL LOW (ref 3.5–5.0)
Anion gap: 4 — ABNORMAL LOW (ref 5–15)
BUN: 10 mg/dL (ref 6–20)
CALCIUM: 7.7 mg/dL — AB (ref 8.9–10.3)
CO2: 21 mmol/L — ABNORMAL LOW (ref 22–32)
CREATININE: 0.48 mg/dL (ref 0.44–1.00)
Chloride: 116 mmol/L — ABNORMAL HIGH (ref 98–111)
Glucose, Bld: 87 mg/dL (ref 70–99)
Potassium: 3.9 mmol/L (ref 3.5–5.1)
Sodium: 141 mmol/L (ref 135–145)
TOTAL PROTEIN: 6.2 g/dL — AB (ref 6.5–8.1)
Total Bilirubin: 0.7 mg/dL (ref 0.3–1.2)

## 2018-04-04 LAB — RAPID URINE DRUG SCREEN, HOSP PERFORMED
Amphetamines: NOT DETECTED
Barbiturates: NOT DETECTED
Benzodiazepines: NOT DETECTED
Cocaine: NOT DETECTED
Opiates: NOT DETECTED
Tetrahydrocannabinol: NOT DETECTED

## 2018-04-04 LAB — DIFFERENTIAL
Abs Immature Granulocytes: 0.01 10*3/uL (ref 0.00–0.07)
BASOS ABS: 0 10*3/uL (ref 0.0–0.1)
BASOS PCT: 1 %
Eosinophils Absolute: 0 10*3/uL (ref 0.0–0.5)
Eosinophils Relative: 1 %
Immature Granulocytes: 0 %
LYMPHS PCT: 34 %
Lymphs Abs: 1.1 10*3/uL (ref 0.7–4.0)
MONO ABS: 0.4 10*3/uL (ref 0.1–1.0)
MONOS PCT: 12 %
NEUTROS ABS: 1.7 10*3/uL (ref 1.7–7.7)
Neutrophils Relative %: 52 %

## 2018-04-04 LAB — URINALYSIS, ROUTINE W REFLEX MICROSCOPIC
Bilirubin Urine: NEGATIVE
Glucose, UA: NEGATIVE mg/dL
Hgb urine dipstick: NEGATIVE
Ketones, ur: NEGATIVE mg/dL
Leukocytes, UA: NEGATIVE
NITRITE: NEGATIVE
Protein, ur: NEGATIVE mg/dL
Specific Gravity, Urine: 1.025 (ref 1.005–1.030)
pH: 6 (ref 5.0–8.0)

## 2018-04-04 LAB — PROTIME-INR
INR: 1.09
PROTHROMBIN TIME: 14 s (ref 11.4–15.2)

## 2018-04-04 LAB — I-STAT TROPONIN, ED: TROPONIN I, POC: 0 ng/mL (ref 0.00–0.08)

## 2018-04-04 LAB — APTT: APTT: 33 s (ref 24–36)

## 2018-04-04 LAB — I-STAT CHEM 8, ED
BUN: 10 mg/dL (ref 6–20)
CALCIUM ION: 1.06 mmol/L — AB (ref 1.15–1.40)
CHLORIDE: 112 mmol/L — AB (ref 98–111)
Creatinine, Ser: 0.4 mg/dL — ABNORMAL LOW (ref 0.44–1.00)
Glucose, Bld: 84 mg/dL (ref 70–99)
HEMATOCRIT: 28 % — AB (ref 36.0–46.0)
Hemoglobin: 9.5 g/dL — ABNORMAL LOW (ref 12.0–15.0)
Potassium: 4.1 mmol/L (ref 3.5–5.1)
SODIUM: 144 mmol/L (ref 135–145)
TCO2: 25 mmol/L (ref 22–32)

## 2018-04-04 LAB — I-STAT BETA HCG BLOOD, ED (MC, WL, AP ONLY)

## 2018-04-04 LAB — ETHANOL

## 2018-04-04 MED ORDER — HYDRALAZINE HCL 20 MG/ML IJ SOLN: 10.0000 mg | INTRAMUSCULAR | Status: DC | PRN

## 2018-04-04 MED ORDER — ASPIRIN 300 MG RE SUPP: 300.0000 mg | Freq: Once | RECTAL | Status: DC

## 2018-04-04 MED ORDER — ACETAMINOPHEN 160 MG/5ML PO SOLN: 650.0000 mg | ORAL | Status: DC | PRN

## 2018-04-04 MED ORDER — ACETAMINOPHEN 650 MG RE SUPP: 650.0000 mg | RECTAL | Status: DC | PRN

## 2018-04-04 MED ORDER — HYDRALAZINE HCL 20 MG/ML IJ SOLN: 5.0000 mg | INTRAMUSCULAR | Status: DC | PRN

## 2018-04-04 MED ORDER — ENOXAPARIN SODIUM 40 MG/0.4ML ~~LOC~~ SOLN: 40.0000 mg | SUBCUTANEOUS | Status: DC

## 2018-04-04 MED ORDER — ENOXAPARIN SODIUM 40 MG/0.4ML ~~LOC~~ SOLN
40.0000 mg | SUBCUTANEOUS | Status: DC
  Administered 2018-04-05: 40 mg via SUBCUTANEOUS
  Filled 2018-04-04: qty 0.4

## 2018-04-04 MED ORDER — ONDANSETRON HCL 4 MG/2ML IJ SOLN: 4.0000 mg | Freq: Four times a day (QID) | INTRAMUSCULAR | Status: DC | PRN

## 2018-04-04 MED ORDER — ACETAMINOPHEN 325 MG PO TABS
650.0000 mg | ORAL_TABLET | ORAL | Status: DC | PRN
  Administered 2018-04-04: 650 mg via ORAL
  Filled 2018-04-04: qty 2

## 2018-04-04 MED ORDER — LABETALOL HCL 5 MG/ML IV SOLN
10.0000 mg | Freq: Once | INTRAVENOUS | Status: AC
  Administered 2018-04-04: 2.5 mg via INTRAVENOUS
  Filled 2018-04-04: qty 4

## 2018-04-04 MED ORDER — ONDANSETRON HCL 4 MG PO TABS: 4.0000 mg | ORAL_TABLET | Freq: Four times a day (QID) | ORAL | Status: DC | PRN

## 2018-04-04 MED ORDER — STROKE: EARLY STAGES OF RECOVERY BOOK
Freq: Once | Status: AC
  Administered 2018-04-04: 23:00:00
  Filled 2018-04-04: qty 1

## 2018-04-04 MED ORDER — IOPAMIDOL (ISOVUE-370) INJECTION 76%
50.0000 mL | Freq: Once | INTRAVENOUS | Status: AC | PRN
  Administered 2018-04-04: 50 mL via INTRAVENOUS

## 2018-04-04 NOTE — Code Documentation (Signed)
47 year old female coming from work with complaints of trouble speaking and slurred speech. Called 911 around 1000 and patient was transported to the ED. EMS did not call a Code Stroke due to patient having no weakness noted and pt being LKW at 2100 yesterday. ED Staff checked patient in and activated a Code Stroke due to bilateral leg drift and aphasia. Stroke Team met patient in her room. Initial NIHSS 3 due to aphasia, dysarthria, and right leg drift. Strength noted to be equal on both sides. Pt taken to CT. Negative for hemorrhage. CTA completed. No LVO noted. Pt is outside the tPA window. Not eligible for IR. Code Stroke cancelled. Handoff given to Millie, RN. Pt to remain in ED to get further evaluation.  

## 2018-04-04 NOTE — H&P (Signed)
History and Physical    Tina Moss:[id number] DOB: 1970/08/15 DOA: 04/04/2018  PCP: Charlott Rakes, MD   Patient coming from: Home.  I have personally briefly reviewed patient's old medical records in [place]  Chief Complaint: Slurred speech.  HPI: Tina Moss is a 47 y.o. female with medical history significant of anemia, epilepsy, fibroids, history of mumps, hypertension, hyperlipidemia, history of seizures who is coming to the emergency department due to having slurred speech at work witnessed by her coworkers.  However, her significant other states that this morning she was not as clear and sharp past usual.  Her speech was slightly slurred, but she had just awaken up and they did not think much of it.  The patient got ready, drove for about 30 minutes and went to work.  Then later hour, her coworkers noticed that she was having significant trouble talking/getting her words.  She was slurring her speech.  She also had right-sided hemiparesis.  She has been various stress of her son recently being incarcerated.  She has not been taking her prescription medications for hypertension.  No fever, chills, sore throat, wheezing, hemoptysis, chest pain, palpitations, dizziness, diaphoresis, PND, orthopnea or pitting edema of the lower extremities.  No abdominal pain, nausea, emesis, diarrhea, constipation, melena or hematochezia.  Denies dysuria, frequency or hematuria.  No heat or cold intolerance.  No polyuria, polydipsia or polyphagia.  ED Course: Initial vital signs temperature 98.4 F, pulse 71, respirations 22, blood pressure 138/81 mmHg and O2 sat 98% on room air.  Her urinalysis shows throat color, but he was otherwise unremarkable.  UDS was negative.  White count was 3.3, hemoglobin 8.2 and platelets 254.  PT/INR/PTT were within normal limits.  I-STAT hCG was less than 5.0.  Troponin was normal.  Sodium 141, potassium 3.9, chloride 116 and CO2 21 mmol/L.  Echo level was  normal.  BUN 10, creatinine 0.48, glucose 87 and calcium 7.7 mg/dL.  Total protein was 6.2 and albumin 3.3 g/dL.  All other volumes are within normal limits.  Imaging: CT head code stroke showed a small area of cytotoxic edema in the left MCA M4 segment without hemorrhagic or mass-effect.  CTA head/neck was normal.  Please see images and full radiology report for further detail.  Review of Systems: As per HPI otherwise 10 point review of systems negative.   Past Medical History:  Diagnosis Date  . Anemia   . Epilepsy ([place])   . Fibroids   . H/O bacterial infection   . H/O mumps   . Hyperlipidemia   . Seizures ([place])     Past Surgical History:  Procedure Laterality Date  . CESAREAN SECTION     x2. at term  . TUBAL LIGATION    . WISDOM TOOTH EXTRACTION       reports that she has been smoking cigars. She uses smokeless tobacco. She reports that she drinks alcohol. She reports that she has current or past drug history. Drug: Marijuana.  No Known Allergies  Family History  Problem Relation Age of Onset  . Hypertension Mother   . Hypertension Sister   . Arthritis Sister        knees  . Hypertension Brother   . Deafness Brother   . Speech disorder Brother        mute  . Mental illness Brother        "he can just fly off"  . Mental illness Daughter        ?  bipolar  . Scoliosis Son     Prior to Admission medications   Medication Sig Start Date End Date Taking? Authorizing Provider  cyclobenzaprine (FLEXERIL) 10 MG tablet Take 1 tablet (10 mg total) by mouth at bedtime. 11/15/16   Charlott Rakes, MD  ferrous sulfate 325 (65 FE) MG tablet Take 1 tablet (325 mg total) by mouth daily with breakfast. 11/15/16   Charlott Rakes, MD  medroxyPROGESTERone (DEPO-PROVERA) 150 MG/ML injection Inject 1 mL (150 mg total) into the muscle every 3 (three) months. 01/06/17   Aletha Halim, MD  metoprolol (LOPRESSOR) 100 MG tablet Take 1 tablet (100 mg total) by mouth 2 (two) times daily. 10/13/16    Charlott Rakes, MD  venlafaxine XR (EFFEXOR XR) 75 MG 24 hr capsule Take 1 capsule (75 mg total) by mouth daily with breakfast. 11/15/16   Charlott Rakes, MD    Physical Exam: Vitals:   04/04/18 1230 04/04/18 1245 04/04/18 1300 04/04/18 1301  BP: (!) 165/99 (!) 156/100 (!) 155/93   Pulse: (!) 103 (!) 105 100   Resp: (!) 22 (!) 22 (!) 24   Temp:    98.4 F (36.9 C)  TempSrc:    Oral  SpO2: 100% 98% 99%   Height:        Constitutional: NAD, calm, comfortable Eyes: PERRL, lids and conjunctivae normal ENMT: Mucous membranes are moist. Posterior pharynx clear of any exudate or lesions. Neck: normal, supple, no masses, no thyromegaly Respiratory: clear to auscultation bilaterally, no wheezing, no crackles. Normal respiratory effort. No accessory muscle use.  Cardiovascular: Regular rate and rhythm, no murmurs / rubs / gallops. No extremity edema. 2+ pedal pulses. No carotid bruits.  Abdomen: Soft, no tenderness, no masses palpated. No hepatosplenomegaly. Bowel sounds positive.  Musculoskeletal: no clubbing / cyanosis. No joint deformity upper and lower extremities. Good ROM, no contractures. Normal muscle tone.  Skin: no rashes, lesions, ulcers. No induration Neurologic: Subtle right facial droop, speech is slurred.  Subjective decrease in sensation on right sided extremities, DTR normal. Strength 4.5/5 right-sided hemiparesis. Psychiatric: Normal judgment and insight. Alert and oriented x 3. Normal mood.   Labs on Admission: I have personally reviewed following labs and imaging studies  CBC: Recent Labs  Lab 04/04/18 1108 04/04/18 1128  WBC 3.3*  --   NEUTROABS 1.7  --   HGB 8.2* 9.5*  HCT 28.1* 28.0*  MCV 73.0*  --   PLT 254  --    Basic Metabolic Panel: Recent Labs  Lab 04/04/18 1108 04/04/18 1128  NA 141 144  K 3.9 4.1  CL 116* 112*  CO2 21*  --   GLUCOSE 87 84  BUN 10 10  CREATININE 0.48 0.40*  CALCIUM 7.7*  --    GFR: CrCl cannot be calculated (Unknown  ideal weight.). Liver Function Tests: Recent Labs  Lab 04/04/18 1108  AST 26  ALT 19  ALKPHOS 76  BILITOT 0.7  PROT 6.2*  ALBUMIN 3.3*   No results for input(s): LIPASE, AMYLASE in the last 168 hours. No results for input(s): AMMONIA in the last 168 hours. Coagulation Profile: Recent Labs  Lab 04/04/18 1108  INR 1.09   Cardiac Enzymes: No results for input(s): CKTOTAL, CKMB, CKMBINDEX, TROPONINI in the last 168 hours. BNP (last 3 results) No results for input(s): PROBNP in the last 8760 hours. HbA1C: No results for input(s): HGBA1C in the last 72 hours. CBG: No results for input(s): GLUCAP in the last 168 hours. Lipid Profile: No results for input(s): CHOL,  HDL, LDLCALC, TRIG, CHOLHDL, LDLDIRECT in the last 72 hours. Thyroid Function Tests: No results for input(s): TSH, T4TOTAL, FREET4, T3FREE, THYROIDAB in the last 72 hours. Anemia Panel: No results for input(s): VITAMINB12, FOLATE, FERRITIN, TIBC, IRON, RETICCTPCT in the last 72 hours. Urine analysis:    Component Value Date/Time   COLORURINE YELLOW 08/21/2017 1726   APPEARANCEUR CLEAR 08/21/2017 1726   LABSPEC 1.012 08/21/2017 1726   PHURINE 5.0 08/21/2017 1726   GLUCOSEU NEGATIVE 08/21/2017 1726   HGBUR NEGATIVE 08/21/2017 1726   BILIRUBINUR NEGATIVE 08/21/2017 1726   BILIRUBINUR small 11/04/2011 1104   KETONESUR NEGATIVE 08/21/2017 1726   PROTEINUR NEGATIVE 08/21/2017 1726   UROBILINOGEN 0.2 12/03/2013 0500   NITRITE NEGATIVE 08/21/2017 1726   LEUKOCYTESUR NEGATIVE 08/21/2017 1726    Radiological Exams on Admission: Ct Angio Head W Or Wo Contrast  Result Date: 04/04/2018 CLINICAL DATA:  Code stroke. 47 year old female with slurred speech this morning. ASPECTS 9 with abnormal left MCA M4 segment on plain head CT. EXAM: CT ANGIOGRAPHY HEAD AND NECK TECHNIQUE: Multidetector CT imaging of the head and neck was performed using the standard protocol during bolus administration of intravenous contrast.  Multiplanar CT image reconstructions and MIPs were obtained to evaluate the vascular anatomy. Carotid stenosis measurements (when applicable) are obtained utilizing NASCET criteria, using the distal internal carotid diameter as the denominator. CONTRAST:  59mL ISOVUE-370 IOPAMIDOL (ISOVUE-370) INJECTION 76% COMPARISON:  Head CT 1140 hours today. Chest radiographs 03/29/2017. FINDINGS: CTA NECK Skeleton: No acute osseous abnormality identified. Upper chest: Partially visible left lung density on series 11, image 160 5 May be due to elevated left hemidiaphragm. Otherwise negative visible lung parenchyma. No superior mediastinal lymphadenopathy. Other neck: Negative, no neck mass or lymphadenopathy. Aortic arch: 3 vessel arch configuration with minimal arch atherosclerosis. Right carotid system: No brachiocephalic artery or right CCA origin plaque or stenosis. Mildly tortuous proximal right CCA. Negative right carotid bifurcation and cervical right ICA. Left carotid system: Normal left CCA origin. Mildly tortuous proximal left CCA. Negative left carotid bifurcation. Negative cervical left ICA. Vertebral arteries: Tortuous proximal right subclavian artery with a mildly kinked appearance but no atherosclerosis. Normal right vertebral artery origin. Patent right vertebral artery to the skull base without stenosis. Mild proximal left subclavian artery atherosclerosis without stenosis. Normal left vertebral artery origin. Patent left vertebral artery to the skull base without stenosis. CTA HEAD Posterior circulation: Patent distal vertebral arteries, the right is somewhat non dominant B on the patent right PICA origin. Normal left PICA origin. No distal vertebral scratched at there is distal vertebral artery irregularity but no associated stenosis. Patent vertebrobasilar junction and basilar artery with similar mild irregularity but no basilar stenosis. Fetal type right PCA origin. SCA and left PCA origins are within normal  limits. Left Posterior communicating artery is diminutive or absent. The bilateral PCA branches are patent and symmetric with mild irregularity. Anterior circulation: Patent left ICA siphon without stenosis. Patent left ICA terminus. Left MCA and ACA origins are normal. Normal left A1 segment. Anterior communicating artery and bilateral ACA branches are within normal limits. Left MCA M1 segment is patent and bifurcates early. Left MCA bifurcation is patent without stenosis. The left MCA M2 branches are patent. No discrete left MCA branch occlusion is identified. Patent right ICA siphon with no plaque or stenosis. Normal right posterior communicating artery origin. Patent right ICA terminus. Normal right MCA and ACA origins. Right MCA M1 segment, right MCA bifurcation, and right MCA M2 branches are patent. The distal right  MCA branches appear symmetric to those on the left Venous sinuses: Patent. Anatomic variants: Mildly dominant distal left vertebral artery. Fetal type right PCA origin. Delayed phase: No abnormal enhancement identified. Stable gray-white matter differentiation throughout the brain. Review of the MIP images confirms the above findings IMPRESSION: 1. Negative for large vessel occlusion and NO discrete Left MCA branch occlusion identified to correspond to the evidence of small anterior left MCA division cortical infarct. This result was communicated to Dr. Lorraine Lax at at 1218 hours by text page via the [facility] messaging system. 2. No arterial atherosclerosis or stenosis in the neck. Mildly irregular appearance of the intracranial arteries diffusely which is nonspecific. No intracranial plaque or hemodynamically significant stenosis identified. 3. No acute findings in the neck and presumed chronic elevation of the left hemidiaphragm responsible for partially visible left lung opacity on the most caudal image. Electronically Signed   By: Genevie Ann M.D.   On: 04/04/2018 12:26   Ct Angio Neck W Or Wo  Contrast  Result Date: 04/04/2018 CLINICAL DATA:  Code stroke. 47 year old female with slurred speech this morning. ASPECTS 9 with abnormal left MCA M4 segment on plain head CT. EXAM: CT ANGIOGRAPHY HEAD AND NECK TECHNIQUE: Multidetector CT imaging of the head and neck was performed using the standard protocol during bolus administration of intravenous contrast. Multiplanar CT image reconstructions and MIPs were obtained to evaluate the vascular anatomy. Carotid stenosis measurements (when applicable) are obtained utilizing NASCET criteria, using the distal internal carotid diameter as the denominator. CONTRAST:  19mL ISOVUE-370 IOPAMIDOL (ISOVUE-370) INJECTION 76% COMPARISON:  Head CT 1140 hours today. Chest radiographs 03/29/2017. FINDINGS: CTA NECK Skeleton: No acute osseous abnormality identified. Upper chest: Partially visible left lung density on series 11, image 160 5 May be due to elevated left hemidiaphragm. Otherwise negative visible lung parenchyma. No superior mediastinal lymphadenopathy. Other neck: Negative, no neck mass or lymphadenopathy. Aortic arch: 3 vessel arch configuration with minimal arch atherosclerosis. Right carotid system: No brachiocephalic artery or right CCA origin plaque or stenosis. Mildly tortuous proximal right CCA. Negative right carotid bifurcation and cervical right ICA. Left carotid system: Normal left CCA origin. Mildly tortuous proximal left CCA. Negative left carotid bifurcation. Negative cervical left ICA. Vertebral arteries: Tortuous proximal right subclavian artery with a mildly kinked appearance but no atherosclerosis. Normal right vertebral artery origin. Patent right vertebral artery to the skull base without stenosis. Mild proximal left subclavian artery atherosclerosis without stenosis. Normal left vertebral artery origin. Patent left vertebral artery to the skull base without stenosis. CTA HEAD Posterior circulation: Patent distal vertebral arteries, the right is  somewhat non dominant B on the patent right PICA origin. Normal left PICA origin. No distal vertebral scratched at there is distal vertebral artery irregularity but no associated stenosis. Patent vertebrobasilar junction and basilar artery with similar mild irregularity but no basilar stenosis. Fetal type right PCA origin. SCA and left PCA origins are within normal limits. Left Posterior communicating artery is diminutive or absent. The bilateral PCA branches are patent and symmetric with mild irregularity. Anterior circulation: Patent left ICA siphon without stenosis. Patent left ICA terminus. Left MCA and ACA origins are normal. Normal left A1 segment. Anterior communicating artery and bilateral ACA branches are within normal limits. Left MCA M1 segment is patent and bifurcates early. Left MCA bifurcation is patent without stenosis. The left MCA M2 branches are patent. No discrete left MCA branch occlusion is identified. Patent right ICA siphon with no plaque or stenosis. Normal right posterior communicating artery  origin. Patent right ICA terminus. Normal right MCA and ACA origins. Right MCA M1 segment, right MCA bifurcation, and right MCA M2 branches are patent. The distal right MCA branches appear symmetric to those on the left Venous sinuses: Patent. Anatomic variants: Mildly dominant distal left vertebral artery. Fetal type right PCA origin. Delayed phase: No abnormal enhancement identified. Stable gray-white matter differentiation throughout the brain. Review of the MIP images confirms the above findings IMPRESSION: 1. Negative for large vessel occlusion and NO discrete Left MCA branch occlusion identified to correspond to the evidence of small anterior left MCA division cortical infarct. This result was communicated to Dr. Lorraine Lax at at 1218 hours by text page via the [facility] messaging system. 2. No arterial atherosclerosis or stenosis in the neck. Mildly irregular appearance of the intracranial arteries  diffusely which is nonspecific. No intracranial plaque or hemodynamically significant stenosis identified. 3. No acute findings in the neck and presumed chronic elevation of the left hemidiaphragm responsible for partially visible left lung opacity on the most caudal image. Electronically Signed   By: Genevie Ann M.D.   On: 04/04/2018 12:26   Ct Head Code Stroke Wo Contrast  Result Date: 04/04/2018 CLINICAL DATA:  Code stroke. 47 year old female with slurred speech this morning. EXAM: CT HEAD WITHOUT CONTRAST TECHNIQUE: Contiguous axial images were obtained from the base of the skull through the vertex without intravenous contrast. COMPARISON:  Brain MRI 03/10/2017.  Head CT 09/15/2016. FINDINGS: Brain: Chronic cerebral white matter hypodense foci as demonstrated on the prior studies appear stable since 2018. Small area of new cortical hypodensity in the left middle frontal gyrus (series 3, image 22). No associated hemorrhage or mass effect. No other changes of acute cortical infarct. Stable cerebral volume. No ventriculomegaly. Basilar cisterns remain normal. Vascular: Mild Calcified atherosclerosis at the skull base. No suspicious intracranial vascular hyperdensity. Skull: Stable and negative. Sinuses/Orbits: Visualized paranasal sinuses and mastoids are stable and well pneumatized. Other: Visualized orbits and scalp soft tissues are within normal limits. ASPECTS [facility] Stroke Program Early CT Score) - Ganglionic level infarction (caudate, lentiform nuclei, internal capsule, insula, M1-M3 cortex): 7 - Supraganglionic infarction (M4-M6 cortex): 2 (abnormal M4 segment). Total score (0-10 with 10 being normal): 9 IMPRESSION: 1. Small area of cytotoxic edema in the left MCA M4 segment without hemorrhage or mass effect. ASPECTS is 9. 2. Underlying chronic bilateral cerebral white matter disease. 3. CTA head and neck reported separately. 4. These results were communicated to Dr. Lorraine Lax at 12:11 pmon 10/22/2019by text  page via the [facility] messaging system. Electronically Signed   By: Genevie Ann M.D.   On: 04/04/2018 12:11    EKG: Independently reviewed.  Vent. rate 102 BPM PR interval * ms QRS duration 82 ms QT/QTc 369/481 ms P-R-T axes 55 71 -47 Sinus tachycardia Ventricular premature complex Right atrial enlargement Borderline repolarization abnormality Artifact in lead(s) I II aVR aVL aVF  Assessment/Plan Principal Problem:   Acute CVA (cerebrovascular accident) ([place]) Admit to telemetry/inpatient. Frequent neuro checks. PT/OT/speech pathology evaluation. Check fasting lipids and hemoglobin A1c. Check echocardiogram. Start daily aspirin. Check MR MRA of brain. Stroke team to follow.  Active Problems:   Seizure disorder ([place]) Last seizure was 5 or 6 months ago. She is currently not taking her antiepileptic prescription.    Anemia Anemia work-up revealed low iron. Resume iron supplementation. Monitor hematocrit and hemoglobin.    MS (multiple sclerosis) ([place]) Not on any medical therapy. Not following regularly with neurology. Does not seem to be active at  this time.    Depression Not on medical therapy. This will need close follow-up given recent stressors.    Essential hypertension Allow permissive hypertension. Monitor blood pressure.    Tobacco use Tobacco cessation encouraged. Staff to provide tobacco cessation information.   DVT prophylaxis: Lovenox SQ. Code Status: Full code Family Communication: Her significant other (Dwayne Lewis) was present in the ED room. Disposition Plan: Admit for CVA work-up. Consults called: Admission status: Inpatient/telemetry.   Reubin Milan MD [facility] Pager [phone].  If 7PM-7AM, please contact night-coverage [email or web address] Password [email or web address]  04/04/2018, 1:28 PM

## 2018-04-04 NOTE — Progress Notes (Signed)
PT Cancellation Note  Patient Details Name: Tina Moss MRN: [id number] DOB: Jan 09, 1971   Cancelled Treatment:    Reason Eval/Treat Not Completed: Patient at procedure or test/unavailable. Will check back as time allows.  Leighton Roach, [place]  Pager [phone] Office [place] 04/04/2018, 3:35 PM

## 2018-04-04 NOTE — ED Notes (Signed)
Report attempted 

## 2018-04-04 NOTE — Consult Note (Addendum)
Neurology Consultation  Reason for Consult: Code stroke Referring Physician: Ayesha Rumpf  CC: Slurred speech bilateral leg weakness  History is obtained from: Family  HPI: Tina Moss is a 47 y.o. female with history of seizures, hyperlipidemia, fibroids, mumps.  Patient apparently went to bed at 9:00 last night and when awoke noted that she had slurred speech.  When she got to work her coworkers noted that she has some slurred speech and difficult getting her words out.  Patient was brought to the emergency department but was not a code stroke.  Upon entering the room in the emergency department it was noted that she was not moving her legs and she was not able to move her legs, had slurred speech thus a code stroke was called.  On initial exam patient had dysarthria along with stuttering speech.  She was able to move her legs and give good heel-to-shin.  She was also labile in the room.  There are also mentions of blurred vision however she states that she has had blurred vision for quite a long time.  Patient has had an EEG in the past 1 year ago which was normal.  In discussing with the family apparently her son has been incarcerated and having trouble in jail.  This is been very stressful to her.   ED course -patient obtained a CT of head showed  Left frontal lobe infarction. and then a CTA of head and neck which were also negative   LKW: 2100 hrs. last night on 04/03/2018 tpa given?: no, outside window  Premorbid modified Rankin scale (mRS): 0 NIH stroke scale of 3  ROS: A 14 point ROS was performed and is negative except as noted in the HPI.   Past Medical History:  Diagnosis Date  . Anemia   . Epilepsy ([place])   . Fibroids   . H/O bacterial infection   . H/O mumps   . Hyperlipidemia   . Seizures ([place])     Family History  Problem Relation Age of Onset  . Hypertension Mother   . Hypertension Sister   . Arthritis Sister        knees  . Hypertension Brother   . Deafness  Brother   . Speech disorder Brother        mute  . Mental illness Brother        "he can just fly off"  . Mental illness Daughter        ? bipolar  . Scoliosis Son      Social History:   reports that she has been smoking cigars. She uses smokeless tobacco. She reports that she drinks alcohol. She reports that she has current or past drug history. Drug: Marijuana.  Medications No current facility-administered medications for this encounter.   Current Outpatient Medications:  .  cyclobenzaprine (FLEXERIL) 10 MG tablet, Take 1 tablet (10 mg total) by mouth at bedtime., Disp: 30 tablet, Rfl: 2 .  ferrous sulfate 325 (65 FE) MG tablet, Take 1 tablet (325 mg total) by mouth daily with breakfast., Disp: 60 tablet, Rfl: 3 .  medroxyPROGESTERone (DEPO-PROVERA) 150 MG/ML injection, Inject 1 mL (150 mg total) into the muscle every 3 (three) months., Disp: 1 mL, Rfl: 3 .  metoprolol (LOPRESSOR) 100 MG tablet, Take 1 tablet (100 mg total) by mouth 2 (two) times daily., Disp: 60 tablet, Rfl: 3 .  venlafaxine XR (EFFEXOR XR) 75 MG 24 hr capsule, Take 1 capsule (75 mg total) by mouth daily with breakfast.,  Disp: 30 capsule, Rfl: 3   Exam: Current vital signs: BP (!) 149/96   Pulse 100   Resp (!) 23   Ht 5\' 7"  (1.702 m)   LMP 03/21/2018   SpO2 98%   BMI 26.31 kg/m  Vital signs in last 24 hours: Pulse Rate:  [71-107] 100 (10/22 1145) Resp:  [17-23] 23 (10/22 1145) BP: (138-181)/(81-122) 149/96 (10/22 1145) SpO2:  [98 %-100 %] 98 % (10/22 1145)  Physical Exam  Constitutional: Appears well-developed and well-nourished.  Psych: Affect appropriate to situation Eyes: No scleral injection HENT: No OP obstrucion Head: Normocephalic.  Cardiovascular: Normal rate and regular rhythm.  Respiratory: Effort normal, non-labored breathing GI: Soft.  No distension. There is no tenderness.  Skin: WDI  Neuro: Mental Status: Patient is awake, alert, oriented to person, place, month, year, and  situation. Patient is able to give a clear and coherent history. signs of expressive aphasia along with stuttering speech and dysarthria Cranial Nerves: II: Visual Fields are full. Pupils are equal, round, and reactive to light.   III,IV, VI: EOMI without ptosis or diploplia.  V: Facial sensation is symmetric to temperature VII: Facial movement is symmetric.  VIII: hearing is intact to voice X: Uvula elevates symmetrically XI: Shoulder shrug is symmetric. XII: tongue is midline without atrophy or fasciculations.  Motor: Tone is normal. Bulk is normal. 5/5 strength was present in all four extremities.  Sensory: Sensation is symmetric to light touch and temperature in the arms and legs. Deep Tendon Reflexes: 2+ and symmetric in the biceps and patellae.  Plantars: Toes are downgoing bilaterally.  Cerebellar: FNF and HKS are intact bilaterally  Labs I have reviewed labs in epic and the results pertinent to this consultation are:   CBC    Component Value Date/Time   WBC 3.3 (L) 04/04/2018 1108   RBC 3.85 (L) 04/04/2018 1108   HGB 9.5 (L) 04/04/2018 1128   HGB 9.6 (L) 09/16/2016 1701   HCT 28.0 (L) 04/04/2018 1128   HCT 31.9 (L) 09/16/2016 1701   PLT 254 04/04/2018 1108   PLT 261 09/16/2016 1701   MCV 73.0 (L) 04/04/2018 1108   MCV 74.5 (A) 12/14/2016 0901   MCV 78 (L) 09/16/2016 1701   MCH 21.3 (L) 04/04/2018 1108   MCHC 29.2 (L) 04/04/2018 1108   RDW 23.4 (H) 04/04/2018 1108   RDW 23.6 (H) 09/16/2016 1701   LYMPHSABS 1.5 08/21/2017 1726   LYMPHSABS 1.1 09/16/2016 1701   MONOABS 0.2 08/21/2017 1726   EOSABS 0.0 08/21/2017 1726   EOSABS 0.1 09/16/2016 1701   BASOSABS 0.1 08/21/2017 1726   BASOSABS 0.0 09/16/2016 1701    CMP     Component Value Date/Time   NA 144 04/04/2018 1128   NA 141 10/13/2016 1559   K 4.1 04/04/2018 1128   CL 112 (H) 04/04/2018 1128   CO2 27 08/21/2017 1726   GLUCOSE 84 04/04/2018 1128   BUN 10 04/04/2018 1128   BUN 11 10/13/2016 1559    CREATININE 0.40 (L) 04/04/2018 1128   CALCIUM 9.4 08/21/2017 1726   PROT 8.5 (H) 08/21/2017 1726   PROT 6.9 10/13/2016 1559   ALBUMIN 4.3 08/21/2017 1726   ALBUMIN 4.1 10/13/2016 1559   AST 22 08/21/2017 1726   ALT 23 08/21/2017 1726   ALKPHOS 115 08/21/2017 1726   BILITOT 0.5 08/21/2017 1726   BILITOT 0.2 10/13/2016 1559   GFRNONAA >60 08/21/2017 1726   GFRAA >60 08/21/2017 1726    Lipid Panel  No results  found for: CHOL, TRIG, HDL, CHOLHDL, VLDL, LDLCALC, LDLDIRECT   Imaging I have reviewed the images obtained:  CT-scan of the brain--small area of cytotoxic edema in the left MCA M4 segment without hemorrhage or mass-effect  CTA of head and neck--no LVO  Etta Quill PA-C [facility] [phone]  M-F  (9:00 am- 5:00 PM)  04/04/2018, 12:09 PM     Assessment: 47 year old female presenting to the hospital as a code stroke.  CT head does show a cortical infarct on the left without LVO.  Patient was out of the window for TPA and not an IR candidate with no LVO noted on CT Angiogram.  Patient at this time will need a further stroke work-up.  Recommendations:  # MRI of the brain without contrast #Transthoracic Echo, may need TEE and loop monitor  # Start patient on ASA 325mg  daily,  #Start or continue Atorvastatin 80 mg/other high intensity statin # BP goal: permissive HTN upto 220/120 mmHg # HBAIC and Lipid profile # Telemetry monitoring # Frequent neuro checks # NPO until passes stroke swallow screen # please page stroke NP  Or  PA  Or MD from 8am -4 pm  as this patient from this time will be  followed by the stroke.   You can look them up on [email or web address]  Password [email or web address]   NEUROHOSPITALIST ADDENDUM Performed a face to face diagnostic evaluation.   I have reviewed the contents of history and physical exam as documented by PA/ARNP/Resident and agree with above documentation.  I have discussed and formulated the above plan as documented. Edits to the note  have been made as needed.  Patient presents with dysarthria, difficulty with speech on waking up this morning. Noted to be hypertensive. CT Head shows left frontal lobe infarct. CTA negative for LVO. Will need admisson for stroke workup.    Karena Addison Doha Boling MD [facility] [phone]   If 7pm to 7am, please call on call as listed on AMION.

## 2018-04-04 NOTE — ED Triage Notes (Signed)
Pt arrives EMS from work where she went this am where co workers noted slurred speech. Pt has hx of htn not treated  x 2-3 months. LSN 9 pm last night when pt went to bed.

## 2018-04-04 NOTE — ED Provider Notes (Addendum)
[facility] EMERGENCY DEPARTMENT Provider Note   CSN: [id number] Arrival date & time: 04/04/18  1027   An emergency department physician performed an initial assessment on this suspected stroke patient at 1112.  History   Chief Complaint Chief Complaint  Patient presents with  . Weakness  . Stroke Symptoms    HPI Tina Moss is a 47 y.o. female with history of anemia and seizures presenting today for altered mental status.  Patient's last seen normal was 9 PM last night prior to bed.  History received from nursing staff is that patient drove herself to work this morning and upon arrival to work coworkers noticed that she was not speaking right before calling EMS.  Patient was not seen this morning by family prior to going to work.  I was called to room by nursing staff for concern of large vessel occlusion, arm drift, a aphasia, ataxia and facial numbness present.  Stroke order set begun.  Dr. Ralene Bathe brought to room who agrees with code stroke, still in window for LVO.  During my initial evaluation patient with gross aphasia, garbled words however with brief episode of clear speech.  When asked if patient was in pain and to point out her pain she said "get a headache sometimes."  Immediately after followed by more aphasia.  HPI  Past Medical History:  Diagnosis Date  . Anemia   . Epilepsy ([place])   . Fibroids   . H/O bacterial infection   . H/O mumps   . Hyperlipidemia   . Seizures [facility])     Patient Active Problem List   Diagnosis Date Noted  . Acute CVA (cerebrovascular accident) ([place]) 04/04/2018  . Tobacco use 04/04/2018  . Heart palpitations 12/14/2016  . Intermittent chest pain 12/14/2016  . LVH (left ventricular hypertrophy) 12/14/2016  . Depression 11/15/2016  . Essential hypertension 11/15/2016  . MS (multiple sclerosis) ([place]) 11/21/2015  . Fibroids 01/18/2012  . Menorrhagia 01/18/2012  . Seizure disorder ([place]) 01/18/2012  . Anemia  01/18/2012    Past Surgical History:  Procedure Laterality Date  . CESAREAN SECTION     x2. at term  . TUBAL LIGATION    . WISDOM TOOTH EXTRACTION       OB History    Gravida  3   Para  2   Term  2   Preterm      AB  1   Living  2     SAB  1   TAB      Ectopic      Multiple      Live Births  2        Obstetric Comments  Term c/s x 2.          Home Medications    Prior to Admission medications   Medication Sig Start Date End Date Taking? Authorizing Provider  OVER THE COUNTER MEDICATION as needed. CR4 weight gain pill   Yes [provider]  cyclobenzaprine (FLEXERIL) 10 MG tablet Take 1 tablet (10 mg total) by mouth at bedtime. Patient not taking: Reported on 04/04/2018 11/15/16   Charlott Rakes, MD  ferrous sulfate 325 (65 FE) MG tablet Take 1 tablet (325 mg total) by mouth daily with breakfast. 11/15/16   Charlott Rakes, MD  medroxyPROGESTERone (DEPO-PROVERA) 150 MG/ML injection Inject 1 mL (150 mg total) into the muscle every 3 (three) months. Patient not taking: Reported on 04/04/2018 01/06/17   Aletha Halim, MD  metoprolol (LOPRESSOR) 100 MG tablet  Take 1 tablet (100 mg total) by mouth 2 (two) times daily. Patient not taking: Reported on 04/04/2018 10/13/16   Charlott Rakes, MD  venlafaxine XR (EFFEXOR XR) 75 MG 24 hr capsule Take 1 capsule (75 mg total) by mouth daily with breakfast. Patient not taking: Reported on 04/04/2018 11/15/16   Charlott Rakes, MD    Family History Family History  Problem Relation Age of Onset  . Hypertension Mother   . Hypertension Sister   . Arthritis Sister        knees  . Hypertension Brother   . Deafness Brother   . Speech disorder Brother        mute  . Mental illness Brother        "he can just fly off"  . Mental illness Daughter        ? bipolar  . Scoliosis Son     Social History Social History   Tobacco Use  . Smoking status: Current Every Day Smoker    Types: Cigars  . Smokeless  tobacco: Current User  . Tobacco comment: "I think I just keep so much on my mind."  Substance Use Topics  . Alcohol use: Yes    Alcohol/week: 0.0 - 3.0 standard drinks    Comment: social  . Drug use: Not Currently    Types: Marijuana    Comment: seldom     Allergies   Patient has no known allergies.   Review of Systems Review of Systems  Unable to perform ROS: Mental status change   Physical Exam Updated Vital Signs BP (!) 161/92   Pulse (!) 101   Temp 98.4 F (36.9 C) (Oral)   Resp 19   Ht 5\' 7"  (1.702 m)   LMP 03/21/2018   SpO2 99%   BMI 26.31 kg/m   Physical Exam  Constitutional: She appears well-developed and well-nourished.  HENT:  Head: Normocephalic and atraumatic.  Right Ear: External ear normal.  Left Ear: External ear normal.  Nose: Nose normal.  Mouth/Throat: Oropharynx is clear and moist.  Eyes: Pupils are equal, round, and reactive to light. EOM are normal.  Neck: Normal range of motion. Neck supple. No tracheal deviation present.  Cardiovascular: Normal rate, regular rhythm, normal heart sounds and intact distal pulses.  Pulses:      Dorsalis pedis pulses are 2+ on the right side, and 2+ on the left side.       Posterior tibial pulses are 2+ on the right side, and 2+ on the left side.  Pulmonary/Chest: Effort normal. No respiratory distress.  Abdominal: Soft. There is no tenderness. There is no rebound and no guarding.  Musculoskeletal: Normal range of motion.  Feet:  Right Foot:  Protective Sensation: 3 sites tested. 3 sites sensed.  Left Foot:  Protective Sensation: 3 sites tested. 3 sites sensed.  Neurological: She is alert. GCS eye subscore is 4. GCS verbal subscore is 3. GCS motor subscore is 6.  Mental Status: Alert, speech with a aphasia, occasional clear sentence. Cranial Nerves: II: Peripheral visual fields grossly normal, pupils equal, round, reactive to light III,IV, VI: ptosis not present, extra-ocular motions intact  bilaterally V,VII: smile symmetric, eyebrows raise symmetric VIII: hearing grossly normal to voice X: uvula elevates symmetrically XI: bilateral shoulder shrug symmetric and strong XII: midline tongue extension without fassiculations Motor: Normal tone. 5/5 strength in upper and lower extremities bilaterally including strong and equal grip strength and dorsiflexion/plantar flexion Sensory: Sensation intact to light touch in all extremities. Cerebellar: Patient  unable to perform finger-to-nose fluidly, slow movements and missing finger. CV: distal pulses palpable throughout  Skin: Skin is warm and dry. Capillary refill takes less than 2 seconds.     ED Treatments / Results  Labs (all labs ordered are listed, but only abnormal results are displayed) Labs Reviewed  CBC - Abnormal; Notable for the following components:      Result Value   WBC 3.3 (*)    RBC 3.85 (*)    Hemoglobin 8.2 (*)    HCT 28.1 (*)    MCV 73.0 (*)    MCH 21.3 (*)    MCHC 29.2 (*)    RDW 23.4 (*)    All other components within normal limits  COMPREHENSIVE METABOLIC PANEL - Abnormal; Notable for the following components:   Chloride 116 (*)    CO2 21 (*)    Calcium 7.7 (*)    Total Protein 6.2 (*)    Albumin 3.3 (*)    Anion gap 4 (*)    All other components within normal limits  URINALYSIS, ROUTINE W REFLEX MICROSCOPIC - Abnormal; Notable for the following components:   Color, Urine STRAW (*)    All other components within normal limits  RETICULOCYTES - Abnormal; Notable for the following components:   RBC. 3.82 (*)    Immature Retic Fract 18.3 (*)    All other components within normal limits  I-STAT CHEM 8, ED - Abnormal; Notable for the following components:   Chloride 112 (*)    Creatinine, Ser 0.40 (*)    Calcium, Ion 1.06 (*)    Hemoglobin 9.5 (*)    HCT 28.0 (*)    All other components within normal limits  ETHANOL  PROTIME-INR  APTT  DIFFERENTIAL  RAPID URINE DRUG SCREEN, HOSP  PERFORMED  I-STAT TROPONIN, ED  I-STAT BETA HCG BLOOD, ED ([facility], [facility], [facility] ONLY)    EKG EKG Interpretation  Date/Time:  Tuesday April 04 2018 11:12:03 EDT Ventricular Rate:  102 PR Interval:    QRS Duration: 82 QT Interval:  369 QTC Calculation: 481 R Axis:   71 Text Interpretation:  Sinus tachycardia Ventricular premature complex Right atrial enlargement Borderline repolarization abnormality Artifact in lead(s) I II aVR aVL aVF Confirmed by Quintella Reichert [phone]) on 04/04/2018 11:15:00 AM   Radiology Ct Angio Head W Or Wo Contrast  Result Date: 04/04/2018 CLINICAL DATA:  Code stroke. 47 year old female with slurred speech this morning. ASPECTS 9 with abnormal left MCA M4 segment on plain head CT. EXAM: CT ANGIOGRAPHY HEAD AND NECK TECHNIQUE: Multidetector CT imaging of the head and neck was performed using the standard protocol during bolus administration of intravenous contrast. Multiplanar CT image reconstructions and MIPs were obtained to evaluate the vascular anatomy. Carotid stenosis measurements (when applicable) are obtained utilizing NASCET criteria, using the distal internal carotid diameter as the denominator. CONTRAST:  59mL ISOVUE-370 IOPAMIDOL (ISOVUE-370) INJECTION 76% COMPARISON:  Head CT 1140 hours today. Chest radiographs 03/29/2017. FINDINGS: CTA NECK Skeleton: No acute osseous abnormality identified. Upper chest: Partially visible left lung density on series 11, image 160 5 May be due to elevated left hemidiaphragm. Otherwise negative visible lung parenchyma. No superior mediastinal lymphadenopathy. Other neck: Negative, no neck mass or lymphadenopathy. Aortic arch: 3 vessel arch configuration with minimal arch atherosclerosis. Right carotid system: No brachiocephalic artery or right CCA origin plaque or stenosis. Mildly tortuous proximal right CCA. Negative right carotid bifurcation and cervical right ICA. Left carotid system: Normal left CCA origin. Mildly tortuous proximal  left CCA. Negative left carotid bifurcation. Negative cervical left ICA. Vertebral arteries: Tortuous proximal right subclavian artery with a mildly kinked appearance but no atherosclerosis. Normal right vertebral artery origin. Patent right vertebral artery to the skull base without stenosis. Mild proximal left subclavian artery atherosclerosis without stenosis. Normal left vertebral artery origin. Patent left vertebral artery to the skull base without stenosis. CTA HEAD Posterior circulation: Patent distal vertebral arteries, the right is somewhat non dominant B on the patent right PICA origin. Normal left PICA origin. No distal vertebral scratched at there is distal vertebral artery irregularity but no associated stenosis. Patent vertebrobasilar junction and basilar artery with similar mild irregularity but no basilar stenosis. Fetal type right PCA origin. SCA and left PCA origins are within normal limits. Left Posterior communicating artery is diminutive or absent. The bilateral PCA branches are patent and symmetric with mild irregularity. Anterior circulation: Patent left ICA siphon without stenosis. Patent left ICA terminus. Left MCA and ACA origins are normal. Normal left A1 segment. Anterior communicating artery and bilateral ACA branches are within normal limits. Left MCA M1 segment is patent and bifurcates early. Left MCA bifurcation is patent without stenosis. The left MCA M2 branches are patent. No discrete left MCA branch occlusion is identified. Patent right ICA siphon with no plaque or stenosis. Normal right posterior communicating artery origin. Patent right ICA terminus. Normal right MCA and ACA origins. Right MCA M1 segment, right MCA bifurcation, and right MCA M2 branches are patent. The distal right MCA branches appear symmetric to those on the left Venous sinuses: Patent. Anatomic variants: Mildly dominant distal left vertebral artery. Fetal type right PCA origin. Delayed phase: No abnormal  enhancement identified. Stable gray-white matter differentiation throughout the brain. Review of the MIP images confirms the above findings IMPRESSION: 1. Negative for large vessel occlusion and NO discrete Left MCA branch occlusion identified to correspond to the evidence of small anterior left MCA division cortical infarct. This result was communicated to Dr. Lorraine Lax at at 1218 hours by text page via the [facility] messaging system. 2. No arterial atherosclerosis or stenosis in the neck. Mildly irregular appearance of the intracranial arteries diffusely which is nonspecific. No intracranial plaque or hemodynamically significant stenosis identified. 3. No acute findings in the neck and presumed chronic elevation of the left hemidiaphragm responsible for partially visible left lung opacity on the most caudal image. Electronically Signed   By: Genevie Ann M.D.   On: 04/04/2018 12:26   Dg Chest 2 View  Result Date: 04/04/2018 CLINICAL DATA:  Acute stroke, slurred speech, right-sided weakness and numbness EXAM: CHEST - 2 VIEW COMPARISON:  Chest x-ray of 03/29/2017 FINDINGS: There is little change in chronic elevation of the left hemidiaphragm. No pneumonia or effusion is seen. Mediastinal and hilar contours are unremarkable and the heart is within normal limits in size. No bony abnormality is seen. IMPRESSION: No active cardiopulmonary disease. Electronically Signed   By: Ivar Drape M.D.   On: 04/04/2018 15:42   Ct Angio Neck W Or Wo Contrast  Result Date: 04/04/2018 CLINICAL DATA:  Code stroke. 47 year old female with slurred speech this morning. ASPECTS 9 with abnormal left MCA M4 segment on plain head CT. EXAM: CT ANGIOGRAPHY HEAD AND NECK TECHNIQUE: Multidetector CT imaging of the head and neck was performed using the standard protocol during bolus administration of intravenous contrast. Multiplanar CT image reconstructions and MIPs were obtained to evaluate the vascular anatomy. Carotid stenosis measurements (when  applicable) are obtained utilizing NASCET criteria, using the  distal internal carotid diameter as the denominator. CONTRAST:  14mL ISOVUE-370 IOPAMIDOL (ISOVUE-370) INJECTION 76% COMPARISON:  Head CT 1140 hours today. Chest radiographs 03/29/2017. FINDINGS: CTA NECK Skeleton: No acute osseous abnormality identified. Upper chest: Partially visible left lung density on series 11, image 160 5 May be due to elevated left hemidiaphragm. Otherwise negative visible lung parenchyma. No superior mediastinal lymphadenopathy. Other neck: Negative, no neck mass or lymphadenopathy. Aortic arch: 3 vessel arch configuration with minimal arch atherosclerosis. Right carotid system: No brachiocephalic artery or right CCA origin plaque or stenosis. Mildly tortuous proximal right CCA. Negative right carotid bifurcation and cervical right ICA. Left carotid system: Normal left CCA origin. Mildly tortuous proximal left CCA. Negative left carotid bifurcation. Negative cervical left ICA. Vertebral arteries: Tortuous proximal right subclavian artery with a mildly kinked appearance but no atherosclerosis. Normal right vertebral artery origin. Patent right vertebral artery to the skull base without stenosis. Mild proximal left subclavian artery atherosclerosis without stenosis. Normal left vertebral artery origin. Patent left vertebral artery to the skull base without stenosis. CTA HEAD Posterior circulation: Patent distal vertebral arteries, the right is somewhat non dominant B on the patent right PICA origin. Normal left PICA origin. No distal vertebral scratched at there is distal vertebral artery irregularity but no associated stenosis. Patent vertebrobasilar junction and basilar artery with similar mild irregularity but no basilar stenosis. Fetal type right PCA origin. SCA and left PCA origins are within normal limits. Left Posterior communicating artery is diminutive or absent. The bilateral PCA branches are patent and symmetric with  mild irregularity. Anterior circulation: Patent left ICA siphon without stenosis. Patent left ICA terminus. Left MCA and ACA origins are normal. Normal left A1 segment. Anterior communicating artery and bilateral ACA branches are within normal limits. Left MCA M1 segment is patent and bifurcates early. Left MCA bifurcation is patent without stenosis. The left MCA M2 branches are patent. No discrete left MCA branch occlusion is identified. Patent right ICA siphon with no plaque or stenosis. Normal right posterior communicating artery origin. Patent right ICA terminus. Normal right MCA and ACA origins. Right MCA M1 segment, right MCA bifurcation, and right MCA M2 branches are patent. The distal right MCA branches appear symmetric to those on the left Venous sinuses: Patent. Anatomic variants: Mildly dominant distal left vertebral artery. Fetal type right PCA origin. Delayed phase: No abnormal enhancement identified. Stable gray-white matter differentiation throughout the brain. Review of the MIP images confirms the above findings IMPRESSION: 1. Negative for large vessel occlusion and NO discrete Left MCA branch occlusion identified to correspond to the evidence of small anterior left MCA division cortical infarct. This result was communicated to Dr. Lorraine Lax at at 1218 hours by text page via the [facility] messaging system. 2. No arterial atherosclerosis or stenosis in the neck. Mildly irregular appearance of the intracranial arteries diffusely which is nonspecific. No intracranial plaque or hemodynamically significant stenosis identified. 3. No acute findings in the neck and presumed chronic elevation of the left hemidiaphragm responsible for partially visible left lung opacity on the most caudal image. Electronically Signed   By: Genevie Ann M.D.   On: 04/04/2018 12:26   Ct Head Code Stroke Wo Contrast  Result Date: 04/04/2018 CLINICAL DATA:  Code stroke. 47 year old female with slurred speech this morning. EXAM: CT HEAD  WITHOUT CONTRAST TECHNIQUE: Contiguous axial images were obtained from the base of the skull through the vertex without intravenous contrast. COMPARISON:  Brain MRI 03/10/2017.  Head CT 09/15/2016. FINDINGS: Brain: Chronic cerebral white  matter hypodense foci as demonstrated on the prior studies appear stable since 2018. Small area of new cortical hypodensity in the left middle frontal gyrus (series 3, image 22). No associated hemorrhage or mass effect. No other changes of acute cortical infarct. Stable cerebral volume. No ventriculomegaly. Basilar cisterns remain normal. Vascular: Mild Calcified atherosclerosis at the skull base. No suspicious intracranial vascular hyperdensity. Skull: Stable and negative. Sinuses/Orbits: Visualized paranasal sinuses and mastoids are stable and well pneumatized. Other: Visualized orbits and scalp soft tissues are within normal limits. ASPECTS [facility] Stroke Program Early CT Score) - Ganglionic level infarction (caudate, lentiform nuclei, internal capsule, insula, M1-M3 cortex): 7 - Supraganglionic infarction (M4-M6 cortex): 2 (abnormal M4 segment). Total score (0-10 with 10 being normal): 9 IMPRESSION: 1. Small area of cytotoxic edema in the left MCA M4 segment without hemorrhage or mass effect. ASPECTS is 9. 2. Underlying chronic bilateral cerebral white matter disease. 3. CTA head and neck reported separately. 4. These results were communicated to Dr. Lorraine Lax at 12:11 pmon 10/22/2019by text page via the [facility] messaging system. Electronically Signed   By: Genevie Ann M.D.   On: 04/04/2018 12:11    Procedures .Critical Care Performed by: Deliah Boston, PA-C Authorized by: Deliah Boston, PA-C   Critical care provider statement:    Critical care time (minutes):  35   Critical care time was exclusive of:  Separately billable procedures and treating other patients   Critical care was necessary to treat or prevent imminent or life-threatening deterioration of the  following conditions:  CNS failure or compromise   Critical care was time spent personally by me on the following activities:  Ordering and performing treatments and interventions, ordering and review of laboratory studies, ordering and review of radiographic studies, pulse oximetry, re-evaluation of patient's condition, review of old charts, obtaining history from patient or surrogate, evaluation of patient's response to treatment, examination of patient, discussions with consultants and development of treatment plan with patient or surrogate   (including critical care time)  Medications Ordered in ED Medications   stroke: mapping our early stages of recovery book (has no administration in time range)  acetaminophen (TYLENOL) tablet 650 mg (has no administration in time range)    Or  acetaminophen (TYLENOL) solution 650 mg (has no administration in time range)    Or  acetaminophen (TYLENOL) suppository 650 mg (has no administration in time range)  enoxaparin (LOVENOX) injection 40 mg (has no administration in time range)  aspirin suppository 300 mg (has no administration in time range)  ondansetron (ZOFRAN) tablet 4 mg (has no administration in time range)    Or  ondansetron (ZOFRAN) injection 4 mg (has no administration in time range)  iopamidol (ISOVUE-370) 76 % injection 50 mL (50 mLs Intravenous Contrast Given 04/04/18 1139)  labetalol (NORMODYNE,TRANDATE) injection 10 mg (2.5 mg Intravenous Given 04/04/18 1300)     Initial Impression / Assessment and Plan / ED Course  I have reviewed the triage vital signs and the nursing notes.  Pertinent labs & imaging results that were available during my care of the patient were reviewed by me and considered in my medical decision making (see chart for details).  Clinical Course as of Apr 05 1635  Tue Apr 04, 2018  1109 Discussed with Dr. Ralene Bathe; Dr. Ralene Bathe seeing patient now   [BM]  1243 Discussion with Dr. Lorraine Lax, admit patient for small  cortical stroke, Dr. Lorraine Lax states that he does not want dual antiplatelet therapy.   [BM]  6789  Discussed with Hospitalist who is admitting patient.   [BM]    Clinical Course User Index [BM] Deliah Boston, PA-C   Patient presenting with aphasia, ataxia and facial numbness.  Last seen normal 9 PM last night.  Code stroke called due to patient being LVO positive with arm drift, facial numbness and a aphasia.  Patient was quickly seen by neurology and work-up begun.  Patient noted to be tachycardic and hypertensive upon arrival.  Afebrile.  Tachycardia gradually resolving, hypertension improved following labetalol.  APTT within normal limits PT/INR within normal limits CBC shows anemia-reticulocyte count ordered CMP nonacute Troponin negative Beta hCG negative UDS negative Urinalysis nonacute Chest x-ray negative EKG reviewed by Dr. Ralene Bathe without acute changes  CT head/CT angios head neck show cortical infarct. MRI is pending.  Patient has been admitted for continued stroke work-up.   Patient was seen and evaluated by Dr. Ralene Bathe who agrees with work-up.   Note: Portions of this report may have been transcribed using voice recognition software. Every effort was made to ensure accuracy; however, inadvertent computerized transcription errors may still be present.  Final Clinical Impressions(s) / ED Diagnoses   Final diagnoses:  Acute cerebrovascular accident (CVA) [facility])    ED Discharge Orders    None       Gari Crown 04/04/18 1638    Deliah Boston, PA-C 04/04/18 1706    Quintella Reichert, MD 04/08/18 [phone]

## 2018-04-04 NOTE — ED Notes (Signed)
Pt not in room for neuro assessment due

## 2018-04-04 NOTE — ED Notes (Signed)
Paged Dr Olevia Bowens

## 2018-04-04 NOTE — ED Notes (Signed)
Attempted to ambulate pt from stretcher to bedside commode with this NT and Millie, Therapist, sports. Pt able to stand and pivot but is weak. Pt placed on PureWick and is resting in bed at this time. Family at bedside.

## 2018-04-05 ENCOUNTER — Inpatient Hospital Stay (HOSPITAL_COMMUNITY): Payer: Self-pay

## 2018-04-05 DIAGNOSIS — I63512 Cerebral infarction due to unspecified occlusion or stenosis of left middle cerebral artery: Secondary | ICD-10-CM

## 2018-04-05 DIAGNOSIS — I639 Cerebral infarction, unspecified: Secondary | ICD-10-CM

## 2018-04-05 DIAGNOSIS — Z720 Tobacco use: Secondary | ICD-10-CM

## 2018-04-05 LAB — LIPID PANEL
Cholesterol: 206 mg/dL — ABNORMAL HIGH (ref 0–200)
HDL: 72 mg/dL (ref >40)
LDL CALC: 121 mg/dL — AB (ref 0–99)
TRIGLYCERIDES: 63 mg/dL (ref <150)
Total CHOL/HDL Ratio: 2.9 RATIO
VLDL: 13 mg/dL (ref 0–40)

## 2018-04-05 LAB — ECHOCARDIOGRAM COMPLETE: HEIGHTINCHES: 67 in

## 2018-04-05 LAB — HEMOGLOBIN A1C
HEMOGLOBIN A1C: 5.9 % — AB (ref 4.8–5.6)
Mean Plasma Glucose: 122.63 mg/dL

## 2018-04-05 LAB — HIV ANTIBODY (ROUTINE TESTING W REFLEX): HIV SCREEN 4TH GENERATION: NONREACTIVE

## 2018-04-05 MED ORDER — SODIUM CHLORIDE 0.9 % IV SOLN
INTRAVENOUS | Status: DC
  Administered 2018-04-05 – 2018-04-06 (×2): via INTRAVENOUS

## 2018-04-05 MED ORDER — ASPIRIN EC 81 MG PO TBEC
81.0000 mg | DELAYED_RELEASE_TABLET | Freq: Every day | ORAL | Status: DC
  Administered 2018-04-05 – 2018-04-07 (×3): 81 mg via ORAL
  Filled 2018-04-05 (×3): qty 1

## 2018-04-05 MED ORDER — ATORVASTATIN CALCIUM 10 MG PO TABS
20.0000 mg | ORAL_TABLET | Freq: Every day | ORAL | Status: DC
  Administered 2018-04-05 – 2018-04-06 (×2): 20 mg via ORAL
  Filled 2018-04-05 (×2): qty 2

## 2018-04-05 NOTE — Care Management Note (Addendum)
Case Management Note  Patient Details  Name: Tina Moss MRN: [id number] Date of Birth: 03-24-1971  Subjective/Objective:      Pt admitted with a stroke. She is from home with her boyfriend.  Pt denies any DME at home.  Pt has not been taking any medications at home. Last was seen at [facility] for PCP. Pt has not been in about at year.  Pt states she has a new job and her insurance should start in Nov. She states at that time she will be assigned a new PCP. She is agreeable to going to [place] until then. Pt denies issues with transportation.             Action/Plan: Awaiting PT/OT evals. CM following for d/c needs. Pt may need MATCH at d/c depending on medications.   Expected Discharge Date:                  Expected Discharge Plan:     In-House Referral:     Discharge planning Services     Post Acute Care Choice:    Choice offered to:     DME Arranged:    DME Agency:     HH Arranged:    HH Agency:     Status of Service:  In process, will continue to follow  If discussed at Long Length of Stay Meetings, dates discussed:    Additional Comments:  Pollie Friar, RN 04/05/2018, 1:36 PM

## 2018-04-05 NOTE — Progress Notes (Signed)
    [facility] has been requested to perform a transesophageal echocardiogram on 04/06/18 for stroke.  After careful review of history and examination, the risks and benefits of transesophageal echocardiogram have been explained including risks of esophageal damage, perforation (1:10,000 risk), bleeding, pharyngeal hematoma as well as other potential complications associated with conscious sedation including aspiration, arrhythmia, respiratory failure and death. Alternatives to treatment were discussed, questions were answered. Patient is willing to proceed.   TEE scheduled for 04/06/18 at 9:30am with Dr. Cheral Almas, PA-C 04/05/2018 2:51 PM

## 2018-04-05 NOTE — Progress Notes (Signed)
CSW acknowledging consult for "Unable to obtain medication". Inappropriate consult, CSW unable to assist. RNCM already aware and met with patient earlier today on access to meds.  CSW signing off.  Laveda Abbe, [place] Clinical Social Worker [phone]

## 2018-04-05 NOTE — Progress Notes (Addendum)
Rehab Admissions Coordinator Note:  Patient was screened by Cleatrice Burke for appropriateness for an Inpatient Acute Rehab Consult per OT rec. At this time, we are recommending Inpatient Rehab consult. I have contacted MD for order via [facility].  Cleatrice Burke 04/05/2018, 4:55 PM  I can be reached at [phone].

## 2018-04-05 NOTE — Progress Notes (Signed)
STROKE TEAM PROGRESS NOTE   SUBJECTIVE (INTERVAL HISTORY) Her daughters are initially in room but left during the round. She is sitting in bed with mild to moderate expressive aphasia. Stated that she had seizure disorder since birth but for the last 4-5 years only had one mild episode 1-2 years ago but can not describe what the mild seizure looked like. She saw Dr. Tomi Likens one year ago concerning for MS but no LP done. She is not on AED or DMDs.    OBJECTIVE Temp:  [97.5 F (36.4 C)-98.4 F (36.9 C)] 98.4 F (36.9 C) (10/23 0726) Pulse Rate:  [84-109] 90 (10/23 0726) Cardiac Rhythm: Normal sinus rhythm (10/23 0700) Resp:  [14-26] 18 (10/23 0726) BP: (143-189)/(91-121) 151/98 (10/23 0726) SpO2:  [97 %-100 %] 99 % (10/23 0726)  No results for input(s): GLUCAP in the last 168 hours. Recent Labs  Lab 04/04/18 1108 04/04/18 1128  NA 141 144  K 3.9 4.1  CL 116* 112*  CO2 21*  --   GLUCOSE 87 84  BUN 10 10  CREATININE 0.48 0.40*  CALCIUM 7.7*  --    Recent Labs  Lab 04/04/18 1108  AST 26  ALT 19  ALKPHOS 76  BILITOT 0.7  PROT 6.2*  ALBUMIN 3.3*   Recent Labs  Lab 04/04/18 1108 04/04/18 1128  WBC 3.3*  --   NEUTROABS 1.7  --   HGB 8.2* 9.5*  HCT 28.1* 28.0*  MCV 73.0*  --   PLT 254  --    No results for input(s): CKTOTAL, CKMB, CKMBINDEX, TROPONINI in the last 168 hours. Recent Labs    04/04/18 1108  LABPROT 14.0  INR 1.09   Recent Labs    04/04/18 1258  COLORURINE STRAW*  LABSPEC 1.025  PHURINE 6.0  GLUCOSEU NEGATIVE  HGBUR NEGATIVE  BILIRUBINUR NEGATIVE  KETONESUR NEGATIVE  PROTEINUR NEGATIVE  NITRITE NEGATIVE  LEUKOCYTESUR NEGATIVE       Component Value Date/Time   CHOL 206 (H) 04/05/2018 0535   TRIG 63 04/05/2018 0535   HDL 72 04/05/2018 0535   CHOLHDL 2.9 04/05/2018 0535   VLDL 13 04/05/2018 0535   LDLCALC 121 (H) 04/05/2018 0535   Lab Results  Component Value Date   HGBA1C 5.9 (H) 04/05/2018      Component Value Date/Time   LABOPIA  NONE DETECTED 04/04/2018 1258   COCAINSCRNUR NONE DETECTED 04/04/2018 1258   LABBENZ NONE DETECTED 04/04/2018 1258   AMPHETMU NONE DETECTED 04/04/2018 1258   THCU NONE DETECTED 04/04/2018 1258   LABBARB NONE DETECTED 04/04/2018 1258    Recent Labs  Lab 04/04/18 1121  ETH <10    I have personally reviewed the radiological images below and agree with the radiology interpretations.  Ct Angio Head W Or Wo Contrast  Result Date: 04/04/2018 CLINICAL DATA:  Code stroke. 47 year old female with slurred speech this morning. ASPECTS 9 with abnormal left MCA M4 segment on plain head CT. EXAM: CT ANGIOGRAPHY HEAD AND NECK TECHNIQUE: Multidetector CT imaging of the head and neck was performed using the standard protocol during bolus administration of intravenous contrast. Multiplanar CT image reconstructions and MIPs were obtained to evaluate the vascular anatomy. Carotid stenosis measurements (when applicable) are obtained utilizing NASCET criteria, using the distal internal carotid diameter as the denominator. CONTRAST:  11mL ISOVUE-370 IOPAMIDOL (ISOVUE-370) INJECTION 76% COMPARISON:  Head CT 1140 hours today. Chest radiographs 03/29/2017. FINDINGS: CTA NECK Skeleton: No acute osseous abnormality identified. Upper chest: Partially visible left lung density on series 11,  image 160 5 May be due to elevated left hemidiaphragm. Otherwise negative visible lung parenchyma. No superior mediastinal lymphadenopathy. Other neck: Negative, no neck mass or lymphadenopathy. Aortic arch: 3 vessel arch configuration with minimal arch atherosclerosis. Right carotid system: No brachiocephalic artery or right CCA origin plaque or stenosis. Mildly tortuous proximal right CCA. Negative right carotid bifurcation and cervical right ICA. Left carotid system: Normal left CCA origin. Mildly tortuous proximal left CCA. Negative left carotid bifurcation. Negative cervical left ICA. Vertebral arteries: Tortuous proximal right  subclavian artery with a mildly kinked appearance but no atherosclerosis. Normal right vertebral artery origin. Patent right vertebral artery to the skull base without stenosis. Mild proximal left subclavian artery atherosclerosis without stenosis. Normal left vertebral artery origin. Patent left vertebral artery to the skull base without stenosis. CTA HEAD Posterior circulation: Patent distal vertebral arteries, the right is somewhat non dominant B on the patent right PICA origin. Normal left PICA origin. No distal vertebral scratched at there is distal vertebral artery irregularity but no associated stenosis. Patent vertebrobasilar junction and basilar artery with similar mild irregularity but no basilar stenosis. Fetal type right PCA origin. SCA and left PCA origins are within normal limits. Left Posterior communicating artery is diminutive or absent. The bilateral PCA branches are patent and symmetric with mild irregularity. Anterior circulation: Patent left ICA siphon without stenosis. Patent left ICA terminus. Left MCA and ACA origins are normal. Normal left A1 segment. Anterior communicating artery and bilateral ACA branches are within normal limits. Left MCA M1 segment is patent and bifurcates early. Left MCA bifurcation is patent without stenosis. The left MCA M2 branches are patent. No discrete left MCA branch occlusion is identified. Patent right ICA siphon with no plaque or stenosis. Normal right posterior communicating artery origin. Patent right ICA terminus. Normal right MCA and ACA origins. Right MCA M1 segment, right MCA bifurcation, and right MCA M2 branches are patent. The distal right MCA branches appear symmetric to those on the left Venous sinuses: Patent. Anatomic variants: Mildly dominant distal left vertebral artery. Fetal type right PCA origin. Delayed phase: No abnormal enhancement identified. Stable gray-white matter differentiation throughout the brain. Review of the MIP images confirms  the above findings IMPRESSION: 1. Negative for large vessel occlusion and NO discrete Left MCA branch occlusion identified to correspond to the evidence of small anterior left MCA division cortical infarct. This result was communicated to Dr. Lorraine Lax at at 1218 hours by text page via the [facility] messaging system. 2. No arterial atherosclerosis or stenosis in the neck. Mildly irregular appearance of the intracranial arteries diffusely which is nonspecific. No intracranial plaque or hemodynamically significant stenosis identified. 3. No acute findings in the neck and presumed chronic elevation of the left hemidiaphragm responsible for partially visible left lung opacity on the most caudal image. Electronically Signed   By: Genevie Ann M.D.   On: 04/04/2018 12:26   Dg Chest 2 View  Result Date: 04/04/2018 CLINICAL DATA:  Acute stroke, slurred speech, right-sided weakness and numbness EXAM: CHEST - 2 VIEW COMPARISON:  Chest x-ray of 03/29/2017 FINDINGS: There is little change in chronic elevation of the left hemidiaphragm. No pneumonia or effusion is seen. Mediastinal and hilar contours are unremarkable and the heart is within normal limits in size. No bony abnormality is seen. IMPRESSION: No active cardiopulmonary disease. Electronically Signed   By: Ivar Drape M.D.   On: 04/04/2018 15:42   Ct Angio Neck W Or Wo Contrast  Result Date: 04/04/2018 CLINICAL DATA:  Code stroke. 47 year old female with slurred speech this morning. ASPECTS 9 with abnormal left MCA M4 segment on plain head CT. EXAM: CT ANGIOGRAPHY HEAD AND NECK TECHNIQUE: Multidetector CT imaging of the head and neck was performed using the standard protocol during bolus administration of intravenous contrast. Multiplanar CT image reconstructions and MIPs were obtained to evaluate the vascular anatomy. Carotid stenosis measurements (when applicable) are obtained utilizing NASCET criteria, using the distal internal carotid diameter as the denominator.  CONTRAST:  22mL ISOVUE-370 IOPAMIDOL (ISOVUE-370) INJECTION 76% COMPARISON:  Head CT 1140 hours today. Chest radiographs 03/29/2017. FINDINGS: CTA NECK Skeleton: No acute osseous abnormality identified. Upper chest: Partially visible left lung density on series 11, image 160 5 May be due to elevated left hemidiaphragm. Otherwise negative visible lung parenchyma. No superior mediastinal lymphadenopathy. Other neck: Negative, no neck mass or lymphadenopathy. Aortic arch: 3 vessel arch configuration with minimal arch atherosclerosis. Right carotid system: No brachiocephalic artery or right CCA origin plaque or stenosis. Mildly tortuous proximal right CCA. Negative right carotid bifurcation and cervical right ICA. Left carotid system: Normal left CCA origin. Mildly tortuous proximal left CCA. Negative left carotid bifurcation. Negative cervical left ICA. Vertebral arteries: Tortuous proximal right subclavian artery with a mildly kinked appearance but no atherosclerosis. Normal right vertebral artery origin. Patent right vertebral artery to the skull base without stenosis. Mild proximal left subclavian artery atherosclerosis without stenosis. Normal left vertebral artery origin. Patent left vertebral artery to the skull base without stenosis. CTA HEAD Posterior circulation: Patent distal vertebral arteries, the right is somewhat non dominant B on the patent right PICA origin. Normal left PICA origin. No distal vertebral scratched at there is distal vertebral artery irregularity but no associated stenosis. Patent vertebrobasilar junction and basilar artery with similar mild irregularity but no basilar stenosis. Fetal type right PCA origin. SCA and left PCA origins are within normal limits. Left Posterior communicating artery is diminutive or absent. The bilateral PCA branches are patent and symmetric with mild irregularity. Anterior circulation: Patent left ICA siphon without stenosis. Patent left ICA terminus. Left MCA  and ACA origins are normal. Normal left A1 segment. Anterior communicating artery and bilateral ACA branches are within normal limits. Left MCA M1 segment is patent and bifurcates early. Left MCA bifurcation is patent without stenosis. The left MCA M2 branches are patent. No discrete left MCA branch occlusion is identified. Patent right ICA siphon with no plaque or stenosis. Normal right posterior communicating artery origin. Patent right ICA terminus. Normal right MCA and ACA origins. Right MCA M1 segment, right MCA bifurcation, and right MCA M2 branches are patent. The distal right MCA branches appear symmetric to those on the left Venous sinuses: Patent. Anatomic variants: Mildly dominant distal left vertebral artery. Fetal type right PCA origin. Delayed phase: No abnormal enhancement identified. Stable gray-white matter differentiation throughout the brain. Review of the MIP images confirms the above findings IMPRESSION: 1. Negative for large vessel occlusion and NO discrete Left MCA branch occlusion identified to correspond to the evidence of small anterior left MCA division cortical infarct. This result was communicated to Dr. Lorraine Lax at at 1218 hours by text page via the [facility] messaging system. 2. No arterial atherosclerosis or stenosis in the neck. Mildly irregular appearance of the intracranial arteries diffusely which is nonspecific. No intracranial plaque or hemodynamically significant stenosis identified. 3. No acute findings in the neck and presumed chronic elevation of the left hemidiaphragm responsible for partially visible left lung opacity on the most caudal image. Electronically Signed  By: Genevie Ann M.D.   On: 04/04/2018 12:26   Mr Brain Wo Contrast  Result Date: 04/04/2018 CLINICAL DATA:  Dysarthria.  Left frontal infarct on CT. EXAM: MRI HEAD WITHOUT CONTRAST MRA HEAD WITHOUT CONTRAST TECHNIQUE: Multiplanar, multiecho pulse sequences of the brain and surrounding structures were obtained  without intravenous contrast. Angiographic images of the head were obtained using MRA technique without contrast. COMPARISON:  Head CT and CTA 04/04/2018 and MRI 03/10/2017 FINDINGS: MRI HEAD FINDINGS Brain: There is a small acute cortical infarct in the posterior left frontal lobe corresponding to the abnormality on CT. No intracranial hemorrhage, mass, midline shift, or extra-axial fluid collection is identified. Bilateral cerebral white matter T2 hyperintensities, most notable in the centrum semiovale, are similar to the prior MRI with the appearance of multiple chronic lacunar infarcts. The ventricles are normal in size. Vascular: Major intracranial vascular flow voids are preserved. Skull and upper cervical spine: Unremarkable bone marrow signal. Sinuses/Orbits: Unremarkable orbits. Minimal right frontal sinus mucosal thickening. Clear mastoid air cells. Other: None. MRA HEAD FINDINGS The visualized distal vertebral arteries are widely patent to the basilar and codominant. Patent bilateral PICA, right AICA, and bilateral SCA origins are visualized. The basilar artery is widely patent. Both PCAs are patent without evidence of significant proximal stenosis. There is a fetal origin of the right PCA. The internal carotid arteries are widely patent from skull base to carotid termini. ACAs and MCAs are patent without evidence of proximal branch occlusion or significant stenosis. No aneurysm is identified. IMPRESSION: 1. Small acute posterior left frontal lobe infarct (MCA territory). 2. 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 3. Negative head MRA. Electronically Signed   By: Logan Bores M.D.   On: 04/04/2018 16:38   Mr Jodene Nam Head Wo Contrast  Result Date: 04/04/2018 CLINICAL DATA:  Dysarthria.  Left frontal infarct on CT. EXAM: MRI HEAD WITHOUT CONTRAST MRA HEAD WITHOUT CONTRAST TECHNIQUE: Multiplanar,  multiecho pulse sequences of the brain and surrounding structures were obtained without intravenous contrast. Angiographic images of the head were obtained using MRA technique without contrast. COMPARISON:  Head CT and CTA 04/04/2018 and MRI 03/10/2017 FINDINGS: MRI HEAD FINDINGS Brain: There is a small acute cortical infarct in the posterior left frontal lobe corresponding to the abnormality on CT. No intracranial hemorrhage, mass, midline shift, or extra-axial fluid collection is identified. Bilateral cerebral white matter T2 hyperintensities, most notable in the centrum semiovale, are similar to the prior MRI with the appearance of multiple chronic lacunar infarcts. The ventricles are normal in size. Vascular: Major intracranial vascular flow voids are preserved. Skull and upper cervical spine: Unremarkable bone marrow signal. Sinuses/Orbits: Unremarkable orbits. Minimal right frontal sinus mucosal thickening. Clear mastoid air cells. Other: None. MRA HEAD FINDINGS The visualized distal vertebral arteries are widely patent to the basilar and codominant. Patent bilateral PICA, right AICA, and bilateral SCA origins are visualized. The basilar artery is widely patent. Both PCAs are patent without evidence of significant proximal stenosis. There is a fetal origin of the right PCA. The internal carotid arteries are widely patent from skull base to carotid termini. ACAs and MCAs are patent without evidence of proximal branch occlusion or significant stenosis. No aneurysm is identified. IMPRESSION: 1. Small acute posterior left frontal lobe infarct (MCA territory). 2. 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 3. Negative head  MRA. Electronically Signed   By: Logan Bores M.D.   On: 04/04/2018 16:38   Ct Head Code Stroke Wo Contrast  Result Date: 04/04/2018 CLINICAL DATA:  Code stroke. 47 year old female  with slurred speech this morning. EXAM: CT HEAD WITHOUT CONTRAST TECHNIQUE: Contiguous axial images were obtained from the base of the skull through the vertex without intravenous contrast. COMPARISON:  Brain MRI 03/10/2017.  Head CT 09/15/2016. FINDINGS: Brain: Chronic cerebral white matter hypodense foci as demonstrated on the prior studies appear stable since 2018. Small area of new cortical hypodensity in the left middle frontal gyrus (series 3, image 22). No associated hemorrhage or mass effect. No other changes of acute cortical infarct. Stable cerebral volume. No ventriculomegaly. Basilar cisterns remain normal. Vascular: Mild Calcified atherosclerosis at the skull base. No suspicious intracranial vascular hyperdensity. Skull: Stable and negative. Sinuses/Orbits: Visualized paranasal sinuses and mastoids are stable and well pneumatized. Other: Visualized orbits and scalp soft tissues are within normal limits. ASPECTS [facility] Stroke Program Early CT Score) - Ganglionic level infarction (caudate, lentiform nuclei, internal capsule, insula, M1-M3 cortex): 7 - Supraganglionic infarction (M4-M6 cortex): 2 (abnormal M4 segment). Total score (0-10 with 10 being normal): 9 IMPRESSION: 1. Small area of cytotoxic edema in the left MCA M4 segment without hemorrhage or mass effect. ASPECTS is 9. 2. Underlying chronic bilateral cerebral white matter disease. 3. CTA head and neck reported separately. 4. These results were communicated to Dr. Lorraine Lax at 12:11 pmon 10/22/2019by text page via the [facility] messaging system. Electronically Signed   By: Genevie Ann M.D.   On: 04/04/2018 12:11   LE venous doppler no DVT  TTE pending    PHYSICAL EXAM  Temp:  [97.5 F (36.4 C)-98.4 F (36.9 C)] 98.4 F (36.9 C) (10/23 0726) Pulse Rate:  [84-109] 90 (10/23 0726) Resp:  [14-26] 18 (10/23 0726) BP: (143-189)/(91-121) 151/98 (10/23 0726) SpO2:  [97 %-100 %] 99 % (10/23 0726)  General - Well nourished, well developed, in no  apparent distress.  Ophthalmologic - fundi not visualized due to noncooperation.  Cardiovascular - Regular rate and rhythm.  Mental Status -  Level of arousal and orientation to time, place, and person were intact. Language including naming, and comprehension was assessed and found intact. However, mild to moderate expressive aphasia with mild to moderate dysarthria and difficulty with repetition.   Cranial Nerves II - XII - II - Visual field intact OU. III, IV, VI - Extraocular movements intact. V - Facial sensation decreased on the right, 70-80% of the left. VII - slight nasolabial fold flattening on the right. VIII - Hearing & vestibular intact bilaterally. X - Palate elevates symmetrically. XI - Chin turning & shoulder shrug intact bilaterally. XII - Tongue protrusion intact.  Motor Strength - The patient's strength was normal in all extremities and pronator drift was absent.  Bulk was normal and fasciculations were absent.   Motor Tone - Muscle tone was assessed at the neck and appendages and was normal.  Reflexes - The patient's reflexes were symmetrical in all extremities and she had no pathological reflexes.  Sensory - Light touch, temperature/pinprick were assessed and were decreased on the right, 70-80% of the left.    Coordination - The patient had normal movements in the hands with no ataxia or dysmetria.  Tremor was absent.  Gait and Station - deferred.   ASSESSMENT/PLAN [name] is a 47 y.o. female with history of seizure disorder, questionable for MS, HTN, HLD, anemia, hx of Mumps  admitted for expressive aphasia. No tPA given due to outside window.    Stroke:  left MCA cortical infarct embolic, unclear source  Resultant mild to moderate expressive aphasia  MRI  Left frontal MCA cortical infarct  CTA head and neck unremarkable  2D Echo  Pending  LE venous doppler neg for DVT  Will need TEE to further evaluate  Will check hypercoagulable and  autoimmune work up  LDL 121  HgbA1c 5.9  UDS neg  HIV and RPR pending  SCDs for VTE prophylaxis  Will likely do LP tomorrow  No antithrombotic prior to admission, now on aspirin 81 mg daily. Avoid plavix or lovenox now given LP planned.  Patient counseled to be compliant with her antithrombotic medications  Ongoing aggressive stroke risk factor management  Therapy recommendations:  pending  Disposition:  Pending  ? MS  As per Dr. Tomi Likens "MRI of the brain with and without contrast back on 12/13/00.  Images are not available, but report states "multiple cerebral white matter lesions.  Multiple sclerosis would be the leading consideration.  Other considerations are migraines, vasculitis, and chronic small vessel disease." a lumbar puncture was performed at that time, but CSF analysis was not available  has not had any episodic symptoms to suggest an MS flare and never received IV steroids.   MRI brain, C/T spine in 02/2017 - Multiple foci of cerebral white matter T2 hyperintensity, abnormal for age. This may reflect chronic small vessel ischemia with multiple chronic lacunar infarcts (deep MCA watershed). Demyelinating disease is possible, however there are no lesions specific for multiple sclerosis. No spinal cord lesions  LP was discussed in Dr. Tomi Likens note, but seems not proceeded yet.   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    She was previously on Dilantin and a medication that started with a "Z".    Her maternal grandmother had seizures.   EEG in 02/2017 normal   Not on AEDs at home  Stated no seizure for last 4-5 years except on slight episode 1-2 years ago but can not describe the episode  Plan for LP tomorrow  Hypertension . Stable on the high side . Permissive hypertension (OK if <220/120) for 24-48 hours post stroke and then gradually normalized within 5-7 days.  Long term BP goal  nomortensive  Hyperlipidemia  Home meds:  none   LDL 121, goal < 70  Now on lipitor 20  Continue statin at discharge  Tobacco abuse  Current smoker  Smoking cessation counseling provided  Pt is willing to quit  Other Stroke Risk Factors    Other Active Problems  Leukocytopenia WBC 3.5->3.3  Anemia  Hb 8.2->9.5, unspecific  Hospital day # 1  I spent  35 minutes in total face-to-face time with the patient, more than 50% of which was spent in counseling and coordination of care, reviewing test results, images and medication, and discussing the diagnosis of stroke, cortical stroke, seizure disorder, possible MS, treatment plan and potential prognosis. This patient's care requiresreview of multiple databases, neurological assessment, discussion with family, other specialists and medical decision making of high complexity. I have reviewed extensively her previous clinic chart, discussed extensively with pt and her daughters, the decision is very complicated given young pt with multiple different neuro diagnosis.    Rosalin Hawking, MD PhD Stroke Neurology 04/05/2018 12:02 PM    To contact Stroke Continuity provider, please refer to [email or web address]  After hours, contact General Neurology

## 2018-04-05 NOTE — Evaluation (Signed)
Occupational Therapy Evaluation Patient Details Name: Tina Moss MRN: [id number] DOB: 18-Jan-1971 Today's Date: 04/05/2018    History of Present Illness pt is a 47 y/o female with pmh significant for epilepsy, HTN, HLD, tobacco use, admitted to ED due to having slurred speech and right sided hemiparesis.  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   Clinical Impression   This 47 y/o female presents with the above. At baseline pt is independent with ADLs, iADLs and functional mobility. Pt presenting with R side weakness, decreased dynamic balance, visual and cognitive deficits impacting her functional performance. Pt requiring minA for functional mobility using RW. Currently requires minA for UB ADL, modA for LB ADLs. Pt with supportive family present throughout session. Pt will benefit from continued acute OT services and feel she is appropriate for CIR level therapy services to maximize her overall safety and independence with ADLs and mobility piror to return home. Will follow.     Follow Up Recommendations  CIR;Supervision/Assistance - 24 hour    Equipment Recommendations  3 in 1 bedside commode;Other (comment)(TBD in next venue)    Recommendations for Other Services Rehab consult     Precautions / Restrictions Precautions Precautions: Fall Restrictions Weight Bearing Restrictions: No      Mobility Bed Mobility Overal bed mobility: Needs Assistance Bed Mobility: Supine to Sit     Supine to sit: Min guard     General bed mobility comments: OOB with PT upon arrival  Transfers Overall transfer level: Needs assistance Equipment used: Rolling walker (2 wheeled) Transfers: Sit to/from Stand Sit to Stand: Min assist;Mod assist         General transfer comment: assist to rise and steady with cues for safety; poor ecentric control requiring increased assist and verbal cues for fully backing up to seated surface prior to sitting     Balance Overall balance assessment: Needs assistance   Sitting balance-Leahy Scale: Good Sitting balance - Comments: can accept min/mod challenge with less tolerance accepting challenge right and forward.   Standing balance support: Bilateral upper extremity supported Standing balance-Leahy Scale: Poor Standing balance comment: reliance on the RW and external support.                           ADL either performed or assessed with clinical judgement   ADL Overall ADL's : Needs assistance/impaired Eating/Feeding: Set up;Sitting   Grooming: Minimal assistance;Sitting   Upper Body Bathing: Min guard;Minimal assistance;Sitting   Lower Body Bathing: Moderate assistance;Sit to/from stand   Upper Body Dressing : Minimal assistance;Sitting   Lower Body Dressing: Moderate assistance;Sit to/from stand Lower Body Dressing Details (indicate cue type and reason): minA standing balance Toilet Transfer: Minimal assistance;Ambulation;Grab bars;RW   Toileting- Clothing Manipulation and Hygiene: Minimal assistance;Sit to/from stand Toileting - Clothing Manipulation Details (indicate cue type and reason): assist for management of gown/mesh briefs; pt performing peri-care seated on toilet     Functional mobility during ADLs: Minimal assistance;Rolling walker       Vision Baseline Vision/History: Wears glasses Wears Glasses: Reading only Patient Visual Report: Blurring of vision Vision Assessment?: Yes Eye Alignment: Within Functional Limits Ocular Range of Motion: Impaired-to be further tested in functional context Alignment/Gaze Preference: Within Defined Limits Tracking/Visual Pursuits: Decreased smoothness of horizontal tracking;Decreased smoothness of vertical tracking;Impaired - to be further tested in functional context;Unable to hold eye position out of midline;Requires cues, head turns, or add eye shifts to track Depth Perception: (  dysmetria noted with finger to nose)      Perception     Praxis      Pertinent Vitals/Pain Pain Assessment: No/denies pain     Hand Dominance Right   Extremity/Trunk Assessment Upper Extremity Assessment Upper Extremity Assessment: Generalized weakness;RUE deficits/detail RUE Deficits / Details: increased weakness noted (R>L); decreased fine motor; will continue to assess RUE Sensation: decreased light touch RUE Coordination: decreased fine motor;decreased gross motor   Lower Extremity Assessment Lower Extremity Assessment: Defer to PT evaluation RLE Deficits / Details: some isolated movement, but also some synergistic movement.  >3+ overall RLE Sensation: decreased light touch RLE Coordination: decreased fine motor       Communication Communication Communication: Expressive difficulties   Cognition Arousal/Alertness: Awake/alert Behavior During Therapy: WFL for tasks assessed/performed Overall Cognitive Status: Impaired/Different from baseline Area of Impairment: Safety/judgement;Problem solving;Following commands(not tested formally overall)                       Following Commands: Follows one step commands with increased time Safety/Judgement: Decreased awareness of safety   Problem Solving: Slow processing;Requires verbal cues;Requires tactile cues     General Comments       Exercises     Shoulder Instructions      Home Living Family/patient expects to be discharged to:: Private residence Living Arrangements: Spouse/significant other Available Help at Discharge: Family;Other (Comment)(as needed) Type of Home: Apartment Home Access: Stairs to enter Entrance Stairs-Number of Steps: flight Entrance Stairs-Rails: Right;Left Home Layout: One level     Bathroom Shower/Tub: Teacher, early years/pre: Standard     Home Equipment: Grab bars - tub/shower          Prior Functioning/Environment Level of Independence: Independent        Comments: works as a Secretary/administrator, I,  drives, runs errands.        OT Problem List: Decreased strength;Decreased range of motion;Decreased activity tolerance;Impaired balance (sitting and/or standing);Impaired vision/perception;Decreased coordination;Decreased safety awareness;Decreased knowledge of use of DME or AE;Impaired UE functional use      OT Treatment/Interventions: Self-care/ADL training;Therapeutic exercise;Neuromuscular education;DME and/or AE instruction;Therapeutic activities;Visual/perceptual remediation/compensation;Patient/family education;Cognitive remediation/compensation    OT Goals(Current goals can be found in the care plan section) Acute Rehab OT Goals Patient Stated Goal: back to work OT Goal Formulation: With patient Time For Goal Achievement: 04/19/18 Potential to Achieve Goals: Good  OT Frequency: Min 2X/week   Barriers to D/C:            Co-evaluation              AM-PAC PT "6 Clicks" Daily Activity     Outcome Measure Help from another person eating meals?: None Help from another person taking care of personal grooming?: A Little Help from another person toileting, which includes using toliet, bedpan, or urinal?: A Lot Help from another person bathing (including washing, rinsing, drying)?: A Lot Help from another person to put on and taking off regular upper body clothing?: A Little Help from another person to put on and taking off regular lower body clothing?: A Lot 6 Click Score: 16   End of Session Equipment Utilized During Treatment: Gait belt;Rolling walker Nurse Communication: Mobility status(Pt left EOB with bed alarm set)  Activity Tolerance: Patient tolerated treatment well Patient left: with family/visitor present;with bed alarm set(sitting EOB)  OT Visit Diagnosis: Unsteadiness on feet (R26.81);Muscle weakness (generalized) (M62.81);Other symptoms and signs involving the nervous system (R29.898)  Time: 5035-4656 OT Time Calculation (min): 25  min Charges:  OT General Charges $OT Visit: 1 Visit OT Evaluation $OT Eval Moderate Complexity: 1 Mod  Tina Moss, OT [employer] Pager [phone] Office [phone]   Tina Moss 04/05/2018, 4:52 PM

## 2018-04-05 NOTE — CV Procedure (Signed)
2D echo attempted but consult in room. Will try later 

## 2018-04-05 NOTE — Progress Notes (Signed)
*  Preliminary Results*  Bilateral lower extremity venous duplex completed. Bilateral lower extremities are negative for deep vein thrombosis. There is evidence of Baker's cyst in the right lower extremity.  04/05/2018 9:08 AM Tina Moss

## 2018-04-05 NOTE — Evaluation (Signed)
Speech Language Pathology Evaluation Patient Details Name: Tina Moss MRN: [id number] DOB: 1971-05-25 Today's Date: 04/05/2018 Time: 5093-2671 SLP Time Calculation (min) (ACUTE ONLY): 59 min  Problem List:  Patient Active Problem List   Diagnosis Date Noted  . Acute CVA (cerebrovascular accident) ([place]) 04/04/2018  . Tobacco use 04/04/2018  . Heart palpitations 12/14/2016  . Intermittent chest pain 12/14/2016  . LVH (left ventricular hypertrophy) 12/14/2016  . Depression 11/15/2016  . Essential hypertension 11/15/2016  . MS (multiple sclerosis) ([place]) 11/21/2015  . Fibroids 01/18/2012  . Menorrhagia 01/18/2012  . Seizure disorder ([place]) 01/18/2012  . Anemia 01/18/2012   Past Medical History:  Past Medical History:  Diagnosis Date  . Anemia   . Epilepsy ([place])   . Fibroids   . H/O bacterial infection   . H/O mumps   . Hyperlipidemia   . Hypertension   . Seizures ([place])    Past Surgical History:  Past Surgical History:  Procedure Laterality Date  . CESAREAN SECTION     x2. at term  . TUBAL LIGATION    . WISDOM TOOTH EXTRACTION     HPI:  47 yo female with h/o HTN, HLD, fibroids, seizures, epilepsy, anemia adm after noted to have difficulty speaking.  Pt admits she stopped taking her BP medicine stating she doesn't like to take pills.  Pt imaging study showed small acute left frontal cva and advanced atrophy.  Pt works as a Secretary/administrator, has 2 children - daughter 54 and son 77.  She has an 11th grade education.     Assessment / Plan / Recommendation Clinical Impression  Patient presents with intact cognition and receptive/expressive language skills.   Facial nerve impairment noted resulting decreased movement of right lower face.  Pt denies sensory deficits and jaw opening was symmetrical.  Motor planning difficulties result in pt groping for words, inconsistent errors, and frequent dysfluency with good awareness.  Repetition inconsistently impaired - most notably at  lengthier sentence.  Pt does self correct but was noted to become tearful x1 during session.  Use of reading, singing and speaking in unison improved fluency.  Using teach back, advised pt to compensation strategies and had her write need to allow her time to talk independently.  Pt will benefit from SLP follow up to maximize her speech abilities (? OP).        SLP Assessment  SLP Recommendation/Assessment: Patient needs continued Speech Lanaguage Pathology Services SLP Visit Diagnosis: Apraxia (R48.2);Other (comment)(motor planning deficits)    Follow Up Recommendations  Outpatient SLP vs Home Health    Frequency and Duration min 1 x/week  1 week      SLP Evaluation Cognition  Arousal/Alertness: Awake/alert Orientation Level: Oriented to person;Oriented to place;Oriented to time;Disoriented to situation(to specific date using cell phone, no calendar in room) Attention: Sustained;Selective Sustained Attention: Appears intact Selective Attention: Appears intact Memory: Appears intact(able to recall 5 items slp was bringing her within 5 minutes) Awareness: Appears intact Problem Solving: Appears intact(need to use call bell due to unsteadiness on her feet) Safety/Judgment: Appears intact       Comprehension  Auditory Comprehension Overall Auditory Comprehension: Appears within functional limits for tasks assessed Yes/No Questions: Within Functional Limits(complex y/n 5/5 correct) Commands: Within Functional Limits Conversation: Complex Visual Recognition/Discrimination Discrimination: Within Function Limits Reading Comprehension Reading Status: Within funtional limits(intact at few sentences, did not test further)    Expression Expression Primary Mode of Expression: Verbal Verbal Expression Overall Verbal Expression: Impaired Initiation: Impaired Automatic Speech: Singing;Name;Social Response  Level of Generative/Spontaneous Verbalization: Sentence Repetition: Impaired Level  of Impairment: Phrase level;Sentence level Naming: No impairment Pragmatics: No impairment Non-Verbal Means of Communication: Gestures;Other (comment)(pointing to items) Written Expression Dominant Hand: Right Written Expression: Within Functional Limits   Oral / Motor  Oral Motor/Sensory Function Overall Oral Motor/Sensory Function: Mild impairment Facial ROM: Reduced right Facial Symmetry: Abnormal symmetry right Facial Strength: Reduced right Facial Sensation: Within Functional Limits Lingual ROM: Within Functional Limits Lingual Symmetry: Within Functional Limits Lingual Strength: Within Functional Limits Lingual Sensation: Within Functional Limits Velum: Within Functional Limits Mandible: Within Functional Limits Motor Speech Overall Motor Speech: Impaired Respiration: Impaired Phonation: Low vocal intensity Resonance: Within functional limits Motor Planning: Impaired Motor Speech Errors: Aware;Groping for words;Inconsistent Effective Techniques: Pause(pausing and repetition helpful, use of unison singing also helpful)   GO                    [name] 04/05/2018, 8:50 AM  Luanna Salk, MS [facility] SLP Acute Rehab Services Pager [phone] Office [phone]

## 2018-04-05 NOTE — Progress Notes (Signed)
  Echocardiogram 2D Echocardiogram has been performed.  Tina Moss 04/05/2018, 3:23 PM

## 2018-04-05 NOTE — Progress Notes (Signed)
Patient ID: Tina Moss, female   DOB: 1970/12/11, 47 y.o.   MRN: [id number]  PROGRESS NOTE    Tina Moss  WGY:[id number] DOB: May 01, 1971 DOA: 04/04/2018 PCP: Charlott Rakes, MD   Brief Narrative:  47 year old female with history of anemia, epilepsy, fibroids, mumps, hypertension, hyperlipidemia, seizures presented on 04/04/2018 with slurred speech.  Patient was admitted with acute stroke.  Neurology was consulted.   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  CTA head and neck unremarkable. -2D echo pending. -Neurology following.  Recommend TEE for further evaluation. -LDL 121.  Continue statin and aspirin. -PT/OT/SLP evaluation  Question of multiple sclerosis -Not on any treatment -Neurology is planning to do LP in a.m.  Avoid Lovenox  Seizure disorder -Not on any antiepileptics at home currently.  Chronic anemia -Hemoglobin stable.  Outpatient follow-up  Essential hypertension -Monitor blood pressure.  Allow permissive hypertension.  Tobacco use -Tobacco cessation encouraged by admitting hospitalist.    DVT prophylaxis: We will hold Lovenox for LP in a.m. Code Status: Full Family Communication: Daughter at bedside Disposition Plan: Depends on clinical outcome Consultants: Neurology  Procedures: None  Antimicrobials: None   Subjective: Patient seen and examined at bedside.  She denies any overnight fever, nausea or vomiting.  Daughter feels that her speech is still slurred.  Objective: Vitals:   04/05/18 0008 04/05/18 0434 04/05/18 0726 04/05/18 1303  BP: (!) 149/100 (!) 143/92 (!) 151/98 (!) 146/91  Pulse: (!) 101 84 90 90  Resp:  18 18  18   Temp: 98.2 F (36.8 C) (!) 97.5 F (36.4 C) 98.4 F (36.9 C) 97.9 F (36.6 C)  TempSrc: Oral Oral Oral Oral  SpO2: 97% 99% 99% 100%  Height:       No intake or output data in the 24 hours ending 04/05/18 1336 There were no vitals filed for this visit.  Examination:  General exam: Appears calm and comfortable.  Still having slurred speech. Respiratory system: Bilateral decreased breath sounds at bases Cardiovascular system: S1 & S2 heard, Rate controlled Gastrointestinal system: Abdomen is nondistended, soft and nontender. Normal bowel sounds heard. Extremities: No cyanosis,  edema  Central nervous system: Alert and oriented. Moving extremities.  Still has slurred speech  Data Reviewed: I have personally reviewed following labs and imaging studies  CBC: Recent Labs  Lab 04/04/18 1108 04/04/18 1128  WBC 3.3*  --   NEUTROABS 1.7  --   HGB 8.2* 9.5*  HCT 28.1* 28.0*  MCV 73.0*  --   PLT 254  --    Basic Metabolic Panel: Recent Labs  Lab 04/04/18 1108 04/04/18 1128  NA 141 144  K 3.9 4.1  CL 116* 112*  CO2 21*  --   GLUCOSE 87 84  BUN 10 10  CREATININE 0.48 0.40*  CALCIUM 7.7*  --    GFR: CrCl cannot be calculated (Unknown ideal weight.). Liver Function Tests: Recent Labs  Lab 04/04/18 1108  AST 26  ALT 19  ALKPHOS 76  BILITOT 0.7  PROT 6.2*  ALBUMIN 3.3*   No results for input(s): LIPASE, AMYLASE in the last 168 hours. No results for input(s): AMMONIA in the last 168 hours. Coagulation  Profile: Recent Labs  Lab 04/04/18 1108  INR 1.09   Cardiac Enzymes: No results for input(s): CKTOTAL, CKMB, CKMBINDEX, TROPONINI in the last 168 hours. BNP (last 3 results) No results for input(s): PROBNP in the last 8760 hours. HbA1C: Recent Labs    04/05/18 0535  HGBA1C 5.9*   CBG: No results for input(s): GLUCAP in the last 168 hours. Lipid Profile: Recent Labs    04/05/18 0535  CHOL 206*  HDL 72  LDLCALC 121*  TRIG 63  CHOLHDL 2.9    Thyroid Function Tests: No results for input(s): TSH, T4TOTAL, FREET4, T3FREE, THYROIDAB in the last 72 hours. Anemia Panel: Recent Labs    04/04/18 1108  RETICCTPCT 1.5   Sepsis Labs: No results for input(s): PROCALCITON, LATICACIDVEN in the last 168 hours.  No results found for this or any previous visit (from the past 240 hour(s)).       Radiology Studies: Ct Angio Head W Or Wo Contrast  Result Date: 04/04/2018 CLINICAL DATA:  Code stroke. 47 year old female with slurred speech this morning. ASPECTS 9 with abnormal left MCA M4 segment on plain head CT. EXAM: CT ANGIOGRAPHY HEAD AND NECK TECHNIQUE: Multidetector CT imaging of the head and neck was performed using the standard protocol during bolus administration of intravenous contrast. Multiplanar CT image reconstructions and MIPs were obtained to evaluate the vascular anatomy. Carotid stenosis measurements (when applicable) are obtained utilizing NASCET criteria, using the distal internal carotid diameter as the denominator. CONTRAST:  63mL ISOVUE-370 IOPAMIDOL (ISOVUE-370) INJECTION 76% COMPARISON:  Head CT 1140 hours today. Chest radiographs 03/29/2017. FINDINGS: CTA NECK Skeleton: No acute osseous abnormality identified. Upper chest: Partially visible left lung density on series 11, image 160 5 May be due to elevated left hemidiaphragm. Otherwise negative visible lung parenchyma. No superior mediastinal lymphadenopathy. Other neck: Negative, no neck mass or lymphadenopathy. Aortic arch: 3 vessel arch configuration with minimal arch atherosclerosis. Right carotid system: No brachiocephalic artery or right CCA origin plaque or stenosis. Mildly tortuous proximal right CCA. Negative right carotid bifurcation and cervical right ICA. Left carotid system: Normal left CCA origin. Mildly tortuous proximal left CCA. Negative left carotid bifurcation. Negative cervical left ICA. Vertebral arteries: Tortuous proximal right subclavian artery with  a mildly kinked appearance but no atherosclerosis. Normal right vertebral artery origin. Patent right vertebral artery to the skull base without stenosis. Mild proximal left subclavian artery atherosclerosis without stenosis. Normal left vertebral artery origin. Patent left vertebral artery to the skull base without stenosis. CTA HEAD Posterior circulation: Patent distal vertebral arteries, the right is somewhat non dominant B on the patent right PICA origin. Normal left PICA origin. No distal vertebral scratched at there is distal vertebral artery irregularity but no associated stenosis. Patent vertebrobasilar junction and basilar artery with similar mild irregularity but no basilar stenosis. Fetal type right PCA origin. SCA and left PCA origins are within normal limits. Left Posterior communicating artery is diminutive or absent. The bilateral PCA branches are patent and symmetric with mild irregularity. Anterior circulation: Patent left ICA siphon without stenosis. Patent left ICA terminus. Left MCA and ACA origins are normal. Normal left A1 segment. Anterior communicating artery and bilateral ACA branches are within normal limits. Left MCA M1 segment is patent and bifurcates early. Left MCA bifurcation is patent without stenosis. The left MCA M2 branches are patent. No discrete left MCA branch occlusion is identified. Patent right ICA siphon with no plaque or stenosis. Normal right posterior communicating artery origin. Patent right ICA terminus. Normal right  MCA and ACA origins. Right MCA M1 segment, right MCA bifurcation, and right MCA M2 branches are patent. The distal right MCA branches appear symmetric to those on the left Venous sinuses: Patent. Anatomic variants: Mildly dominant distal left vertebral artery. Fetal type right PCA origin. Delayed phase: No abnormal enhancement identified. Stable gray-white matter differentiation throughout the brain. Review of the MIP images confirms the above findings  IMPRESSION: 1. Negative for large vessel occlusion and NO discrete Left MCA branch occlusion identified to correspond to the evidence of small anterior left MCA division cortical infarct. This result was communicated to Dr. Lorraine Lax at at 1218 hours by text page via the [facility] messaging system. 2. No arterial atherosclerosis or stenosis in the neck. Mildly irregular appearance of the intracranial arteries diffusely which is nonspecific. No intracranial plaque or hemodynamically significant stenosis identified. 3. No acute findings in the neck and presumed chronic elevation of the left hemidiaphragm responsible for partially visible left lung opacity on the most caudal image. Electronically Signed   By: Genevie Ann M.D.   On: 04/04/2018 12:26   Dg Chest 2 View  Result Date: 04/04/2018 CLINICAL DATA:  Acute stroke, slurred speech, right-sided weakness and numbness EXAM: CHEST - 2 VIEW COMPARISON:  Chest x-ray of 03/29/2017 FINDINGS: There is little change in chronic elevation of the left hemidiaphragm. No pneumonia or effusion is seen. Mediastinal and hilar contours are unremarkable and the heart is within normal limits in size. No bony abnormality is seen. IMPRESSION: No active cardiopulmonary disease. Electronically Signed   By: Ivar Drape M.D.   On: 04/04/2018 15:42   Ct Angio Neck W Or Wo Contrast  Result Date: 04/04/2018 CLINICAL DATA:  Code stroke. 47 year old female with slurred speech this morning. ASPECTS 9 with abnormal left MCA M4 segment on plain head CT. EXAM: CT ANGIOGRAPHY HEAD AND NECK TECHNIQUE: Multidetector CT imaging of the head and neck was performed using the standard protocol during bolus administration of intravenous contrast. Multiplanar CT image reconstructions and MIPs were obtained to evaluate the vascular anatomy. Carotid stenosis measurements (when applicable) are obtained utilizing NASCET criteria, using the distal internal carotid diameter as the denominator. CONTRAST:  23mL  ISOVUE-370 IOPAMIDOL (ISOVUE-370) INJECTION 76% COMPARISON:  Head CT 1140 hours today. Chest radiographs 03/29/2017. FINDINGS: CTA NECK Skeleton: No acute osseous abnormality identified. Upper chest: Partially visible left lung density on series 11, image 160 5 May be due to elevated left hemidiaphragm. Otherwise negative visible lung parenchyma. No superior mediastinal lymphadenopathy. Other neck: Negative, no neck mass or lymphadenopathy. Aortic arch: 3 vessel arch configuration with minimal arch atherosclerosis. Right carotid system: No brachiocephalic artery or right CCA origin plaque or stenosis. Mildly tortuous proximal right CCA. Negative right carotid bifurcation and cervical right ICA. Left carotid system: Normal left CCA origin. Mildly tortuous proximal left CCA. Negative left carotid bifurcation. Negative cervical left ICA. Vertebral arteries: Tortuous proximal right subclavian artery with a mildly kinked appearance but no atherosclerosis. Normal right vertebral artery origin. Patent right vertebral artery to the skull base without stenosis. Mild proximal left subclavian artery atherosclerosis without stenosis. Normal left vertebral artery origin. Patent left vertebral artery to the skull base without stenosis. CTA HEAD Posterior circulation: Patent distal vertebral arteries, the right is somewhat non dominant B on the patent right PICA origin. Normal left PICA origin. No distal vertebral scratched at there is distal vertebral artery irregularity but no associated stenosis. Patent vertebrobasilar junction and basilar artery with similar mild irregularity but no basilar stenosis. Fetal type  right PCA origin. SCA and left PCA origins are within normal limits. Left Posterior communicating artery is diminutive or absent. The bilateral PCA branches are patent and symmetric with mild irregularity. Anterior circulation: Patent left ICA siphon without stenosis. Patent left ICA terminus. Left MCA and ACA origins  are normal. Normal left A1 segment. Anterior communicating artery and bilateral ACA branches are within normal limits. Left MCA M1 segment is patent and bifurcates early. Left MCA bifurcation is patent without stenosis. The left MCA M2 branches are patent. No discrete left MCA branch occlusion is identified. Patent right ICA siphon with no plaque or stenosis. Normal right posterior communicating artery origin. Patent right ICA terminus. Normal right MCA and ACA origins. Right MCA M1 segment, right MCA bifurcation, and right MCA M2 branches are patent. The distal right MCA branches appear symmetric to those on the left Venous sinuses: Patent. Anatomic variants: Mildly dominant distal left vertebral artery. Fetal type right PCA origin. Delayed phase: No abnormal enhancement identified. Stable gray-white matter differentiation throughout the brain. Review of the MIP images confirms the above findings IMPRESSION: 1. Negative for large vessel occlusion and NO discrete Left MCA branch occlusion identified to correspond to the evidence of small anterior left MCA division cortical infarct. This result was communicated to Dr. Lorraine Lax at at 1218 hours by text page via the [facility] messaging system. 2. No arterial atherosclerosis or stenosis in the neck. Mildly irregular appearance of the intracranial arteries diffusely which is nonspecific. No intracranial plaque or hemodynamically significant stenosis identified. 3. No acute findings in the neck and presumed chronic elevation of the left hemidiaphragm responsible for partially visible left lung opacity on the most caudal image. Electronically Signed   By: Genevie Ann M.D.   On: 04/04/2018 12:26   Mr Brain Wo Contrast  Result Date: 04/04/2018 CLINICAL DATA:  Dysarthria.  Left frontal infarct on CT. EXAM: MRI HEAD WITHOUT CONTRAST MRA HEAD WITHOUT CONTRAST TECHNIQUE: Multiplanar, multiecho pulse sequences of the brain and surrounding structures were obtained without intravenous  contrast. Angiographic images of the head were obtained using MRA technique without contrast. COMPARISON:  Head CT and CTA 04/04/2018 and MRI 03/10/2017 FINDINGS: MRI HEAD FINDINGS Brain: There is a small acute cortical infarct in the posterior left frontal lobe corresponding to the abnormality on CT. No intracranial hemorrhage, mass, midline shift, or extra-axial fluid collection is identified. Bilateral cerebral white matter T2 hyperintensities, most notable in the centrum semiovale, are similar to the prior MRI with the appearance of multiple chronic lacunar infarcts. The ventricles are normal in size. Vascular: Major intracranial vascular flow voids are preserved. Skull and upper cervical spine: Unremarkable bone marrow signal. Sinuses/Orbits: Unremarkable orbits. Minimal right frontal sinus mucosal thickening. Clear mastoid air cells. Other: None. MRA HEAD FINDINGS The visualized distal vertebral arteries are widely patent to the basilar and codominant. Patent bilateral PICA, right AICA, and bilateral SCA origins are visualized. The basilar artery is widely patent. Both PCAs are patent without evidence of significant proximal stenosis. There is a fetal origin of the right PCA. The internal carotid arteries are widely patent from skull base to carotid termini. ACAs and MCAs are patent without evidence of proximal branch occlusion or significant stenosis. No aneurysm is identified. IMPRESSION: 1. Small acute posterior left frontal lobe infarct (MCA territory). 2. 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 3. Negative head MRA. Electronically Signed   By: Seymour Bars.D.  On: 04/04/2018 16:38   Mr Jodene Nam Head Wo Contrast  Result Date: 04/04/2018 CLINICAL DATA:  Dysarthria.  Left frontal infarct on CT. EXAM: MRI HEAD WITHOUT CONTRAST MRA HEAD WITHOUT CONTRAST TECHNIQUE: Multiplanar, multiecho pulse  sequences of the brain and surrounding structures were obtained without intravenous contrast. Angiographic images of the head were obtained using MRA technique without contrast. COMPARISON:  Head CT and CTA 04/04/2018 and MRI 03/10/2017 FINDINGS: MRI HEAD FINDINGS Brain: There is a small acute cortical infarct in the posterior left frontal lobe corresponding to the abnormality on CT. No intracranial hemorrhage, mass, midline shift, or extra-axial fluid collection is identified. Bilateral cerebral white matter T2 hyperintensities, most notable in the centrum semiovale, are similar to the prior MRI with the appearance of multiple chronic lacunar infarcts. The ventricles are normal in size. Vascular: Major intracranial vascular flow voids are preserved. Skull and upper cervical spine: Unremarkable bone marrow signal. Sinuses/Orbits: Unremarkable orbits. Minimal right frontal sinus mucosal thickening. Clear mastoid air cells. Other: None. MRA HEAD FINDINGS The visualized distal vertebral arteries are widely patent to the basilar and codominant. Patent bilateral PICA, right AICA, and bilateral SCA origins are visualized. The basilar artery is widely patent. Both PCAs are patent without evidence of significant proximal stenosis. There is a fetal origin of the right PCA. The internal carotid arteries are widely patent from skull base to carotid termini. ACAs and MCAs are patent without evidence of proximal branch occlusion or significant stenosis. No aneurysm is identified. IMPRESSION: 1. Small acute posterior left frontal lobe infarct (MCA territory). 2. 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 3. Negative head MRA. Electronically Signed   By: Logan Bores M.D.   On: 04/04/2018 16:38   Ct Head Code Stroke Wo Contrast  Result Date: 04/04/2018 CLINICAL DATA:  Code stroke. 47 year old female with slurred speech  this morning. EXAM: CT HEAD WITHOUT CONTRAST TECHNIQUE: Contiguous axial images were obtained from the base of the skull through the vertex without intravenous contrast. COMPARISON:  Brain MRI 03/10/2017.  Head CT 09/15/2016. FINDINGS: Brain: Chronic cerebral white matter hypodense foci as demonstrated on the prior studies appear stable since 2018. Small area of new cortical hypodensity in the left middle frontal gyrus (series 3, image 22). No associated hemorrhage or mass effect. No other changes of acute cortical infarct. Stable cerebral volume. No ventriculomegaly. Basilar cisterns remain normal. Vascular: Mild Calcified atherosclerosis at the skull base. No suspicious intracranial vascular hyperdensity. Skull: Stable and negative. Sinuses/Orbits: Visualized paranasal sinuses and mastoids are stable and well pneumatized. Other: Visualized orbits and scalp soft tissues are within normal limits. ASPECTS [facility] Stroke Program Early CT Score) - Ganglionic level infarction (caudate, lentiform nuclei, internal capsule, insula, M1-M3 cortex): 7 - Supraganglionic infarction (M4-M6 cortex): 2 (abnormal M4 segment). Total score (0-10 with 10 being normal): 9 IMPRESSION: 1. Small area of cytotoxic edema in the left MCA M4 segment without hemorrhage or mass effect. ASPECTS is 9. 2. Underlying chronic bilateral cerebral white matter disease. 3. CTA head and neck reported separately. 4. These results were communicated to Dr. Lorraine Lax at 12:11 pmon 10/22/2019by text page via the [facility] messaging system. Electronically Signed   By: Genevie Ann M.D.   On: 04/04/2018 12:11        Scheduled Meds: . aspirin EC  81 mg Oral Daily  . atorvastatin  20 mg Oral q1800   Continuous Infusions:   LOS: 1 day  Aline August, MD [facility] Pager [phone]  If 7PM-7AM, please contact night-coverage [email or web address] Password [email or web address] 04/05/2018, 1:36 PM

## 2018-04-05 NOTE — H&P (View-Only) (Signed)
STROKE TEAM PROGRESS NOTE   SUBJECTIVE (INTERVAL HISTORY) Her daughters are initially in room but left during the round. She is sitting in bed with mild to moderate expressive aphasia. Stated that she had seizure disorder since birth but for the last 4-5 years only had one mild episode 1-2 years ago but can not describe what the mild seizure looked like. She saw Dr. Tomi Likens one year ago concerning for MS but no LP done. She is not on AED or DMDs.    OBJECTIVE Temp:  [97.5 F (36.4 C)-98.4 F (36.9 C)] 98.4 F (36.9 C) (10/23 0726) Pulse Rate:  [84-109] 90 (10/23 0726) Cardiac Rhythm: Normal sinus rhythm (10/23 0700) Resp:  [14-26] 18 (10/23 0726) BP: (143-189)/(91-121) 151/98 (10/23 0726) SpO2:  [97 %-100 %] 99 % (10/23 0726)  No results for input(s): GLUCAP in the last 168 hours. Recent Labs  Lab 04/04/18 1108 04/04/18 1128  NA 141 144  K 3.9 4.1  CL 116* 112*  CO2 21*  --   GLUCOSE 87 84  BUN 10 10  CREATININE 0.48 0.40*  CALCIUM 7.7*  --    Recent Labs  Lab 04/04/18 1108  AST 26  ALT 19  ALKPHOS 76  BILITOT 0.7  PROT 6.2*  ALBUMIN 3.3*   Recent Labs  Lab 04/04/18 1108 04/04/18 1128  WBC 3.3*  --   NEUTROABS 1.7  --   HGB 8.2* 9.5*  HCT 28.1* 28.0*  MCV 73.0*  --   PLT 254  --    No results for input(s): CKTOTAL, CKMB, CKMBINDEX, TROPONINI in the last 168 hours. Recent Labs    04/04/18 1108  LABPROT 14.0  INR 1.09   Recent Labs    04/04/18 1258  COLORURINE STRAW*  LABSPEC 1.025  PHURINE 6.0  GLUCOSEU NEGATIVE  HGBUR NEGATIVE  BILIRUBINUR NEGATIVE  KETONESUR NEGATIVE  PROTEINUR NEGATIVE  NITRITE NEGATIVE  LEUKOCYTESUR NEGATIVE       Component Value Date/Time   CHOL 206 (H) 04/05/2018 0535   TRIG 63 04/05/2018 0535   HDL 72 04/05/2018 0535   CHOLHDL 2.9 04/05/2018 0535   VLDL 13 04/05/2018 0535   LDLCALC 121 (H) 04/05/2018 0535   Lab Results  Component Value Date   HGBA1C 5.9 (H) 04/05/2018      Component Value Date/Time   LABOPIA  NONE DETECTED 04/04/2018 1258   COCAINSCRNUR NONE DETECTED 04/04/2018 1258   LABBENZ NONE DETECTED 04/04/2018 1258   AMPHETMU NONE DETECTED 04/04/2018 1258   THCU NONE DETECTED 04/04/2018 1258   LABBARB NONE DETECTED 04/04/2018 1258    Recent Labs  Lab 04/04/18 1121  ETH <10    I have personally reviewed the radiological images below and agree with the radiology interpretations.  Ct Angio Head W Or Wo Contrast  Result Date: 04/04/2018 CLINICAL DATA:  Code stroke. 47 year old female with slurred speech this morning. ASPECTS 9 with abnormal left MCA M4 segment on plain head CT. EXAM: CT ANGIOGRAPHY HEAD AND NECK TECHNIQUE: Multidetector CT imaging of the head and neck was performed using the standard protocol during bolus administration of intravenous contrast. Multiplanar CT image reconstructions and MIPs were obtained to evaluate the vascular anatomy. Carotid stenosis measurements (when applicable) are obtained utilizing NASCET criteria, using the distal internal carotid diameter as the denominator. CONTRAST:  2mL ISOVUE-370 IOPAMIDOL (ISOVUE-370) INJECTION 76% COMPARISON:  Head CT 1140 hours today. Chest radiographs 03/29/2017. FINDINGS: CTA NECK Skeleton: No acute osseous abnormality identified. Upper chest: Partially visible left lung density on series 11,  image 160 5 May be due to elevated left hemidiaphragm. Otherwise negative visible lung parenchyma. No superior mediastinal lymphadenopathy. Other neck: Negative, no neck mass or lymphadenopathy. Aortic arch: 3 vessel arch configuration with minimal arch atherosclerosis. Right carotid system: No brachiocephalic artery or right CCA origin plaque or stenosis. Mildly tortuous proximal right CCA. Negative right carotid bifurcation and cervical right ICA. Left carotid system: Normal left CCA origin. Mildly tortuous proximal left CCA. Negative left carotid bifurcation. Negative cervical left ICA. Vertebral arteries: Tortuous proximal right  subclavian artery with a mildly kinked appearance but no atherosclerosis. Normal right vertebral artery origin. Patent right vertebral artery to the skull base without stenosis. Mild proximal left subclavian artery atherosclerosis without stenosis. Normal left vertebral artery origin. Patent left vertebral artery to the skull base without stenosis. CTA HEAD Posterior circulation: Patent distal vertebral arteries, the right is somewhat non dominant B on the patent right PICA origin. Normal left PICA origin. No distal vertebral scratched at there is distal vertebral artery irregularity but no associated stenosis. Patent vertebrobasilar junction and basilar artery with similar mild irregularity but no basilar stenosis. Fetal type right PCA origin. SCA and left PCA origins are within normal limits. Left Posterior communicating artery is diminutive or absent. The bilateral PCA branches are patent and symmetric with mild irregularity. Anterior circulation: Patent left ICA siphon without stenosis. Patent left ICA terminus. Left MCA and ACA origins are normal. Normal left A1 segment. Anterior communicating artery and bilateral ACA branches are within normal limits. Left MCA M1 segment is patent and bifurcates early. Left MCA bifurcation is patent without stenosis. The left MCA M2 branches are patent. No discrete left MCA branch occlusion is identified. Patent right ICA siphon with no plaque or stenosis. Normal right posterior communicating artery origin. Patent right ICA terminus. Normal right MCA and ACA origins. Right MCA M1 segment, right MCA bifurcation, and right MCA M2 branches are patent. The distal right MCA branches appear symmetric to those on the left Venous sinuses: Patent. Anatomic variants: Mildly dominant distal left vertebral artery. Fetal type right PCA origin. Delayed phase: No abnormal enhancement identified. Stable gray-white matter differentiation throughout the brain. Review of the MIP images confirms  the above findings IMPRESSION: 1. Negative for large vessel occlusion and NO discrete Left MCA branch occlusion identified to correspond to the evidence of small anterior left MCA division cortical infarct. This result was communicated to Dr. Lorraine Lax at at 1218 hours by text page via the [facility] messaging system. 2. No arterial atherosclerosis or stenosis in the neck. Mildly irregular appearance of the intracranial arteries diffusely which is nonspecific. No intracranial plaque or hemodynamically significant stenosis identified. 3. No acute findings in the neck and presumed chronic elevation of the left hemidiaphragm responsible for partially visible left lung opacity on the most caudal image. Electronically Signed   By: Genevie Ann M.D.   On: 04/04/2018 12:26   Dg Chest 2 View  Result Date: 04/04/2018 CLINICAL DATA:  Acute stroke, slurred speech, right-sided weakness and numbness EXAM: CHEST - 2 VIEW COMPARISON:  Chest x-ray of 03/29/2017 FINDINGS: There is little change in chronic elevation of the left hemidiaphragm. No pneumonia or effusion is seen. Mediastinal and hilar contours are unremarkable and the heart is within normal limits in size. No bony abnormality is seen. IMPRESSION: No active cardiopulmonary disease. Electronically Signed   By: Ivar Drape M.D.   On: 04/04/2018 15:42   Ct Angio Neck W Or Wo Contrast  Result Date: 04/04/2018 CLINICAL DATA:  Code stroke. 47 year old female with slurred speech this morning. ASPECTS 9 with abnormal left MCA M4 segment on plain head CT. EXAM: CT ANGIOGRAPHY HEAD AND NECK TECHNIQUE: Multidetector CT imaging of the head and neck was performed using the standard protocol during bolus administration of intravenous contrast. Multiplanar CT image reconstructions and MIPs were obtained to evaluate the vascular anatomy. Carotid stenosis measurements (when applicable) are obtained utilizing NASCET criteria, using the distal internal carotid diameter as the denominator.  CONTRAST:  30mL ISOVUE-370 IOPAMIDOL (ISOVUE-370) INJECTION 76% COMPARISON:  Head CT 1140 hours today. Chest radiographs 03/29/2017. FINDINGS: CTA NECK Skeleton: No acute osseous abnormality identified. Upper chest: Partially visible left lung density on series 11, image 160 5 May be due to elevated left hemidiaphragm. Otherwise negative visible lung parenchyma. No superior mediastinal lymphadenopathy. Other neck: Negative, no neck mass or lymphadenopathy. Aortic arch: 3 vessel arch configuration with minimal arch atherosclerosis. Right carotid system: No brachiocephalic artery or right CCA origin plaque or stenosis. Mildly tortuous proximal right CCA. Negative right carotid bifurcation and cervical right ICA. Left carotid system: Normal left CCA origin. Mildly tortuous proximal left CCA. Negative left carotid bifurcation. Negative cervical left ICA. Vertebral arteries: Tortuous proximal right subclavian artery with a mildly kinked appearance but no atherosclerosis. Normal right vertebral artery origin. Patent right vertebral artery to the skull base without stenosis. Mild proximal left subclavian artery atherosclerosis without stenosis. Normal left vertebral artery origin. Patent left vertebral artery to the skull base without stenosis. CTA HEAD Posterior circulation: Patent distal vertebral arteries, the right is somewhat non dominant B on the patent right PICA origin. Normal left PICA origin. No distal vertebral scratched at there is distal vertebral artery irregularity but no associated stenosis. Patent vertebrobasilar junction and basilar artery with similar mild irregularity but no basilar stenosis. Fetal type right PCA origin. SCA and left PCA origins are within normal limits. Left Posterior communicating artery is diminutive or absent. The bilateral PCA branches are patent and symmetric with mild irregularity. Anterior circulation: Patent left ICA siphon without stenosis. Patent left ICA terminus. Left MCA  and ACA origins are normal. Normal left A1 segment. Anterior communicating artery and bilateral ACA branches are within normal limits. Left MCA M1 segment is patent and bifurcates early. Left MCA bifurcation is patent without stenosis. The left MCA M2 branches are patent. No discrete left MCA branch occlusion is identified. Patent right ICA siphon with no plaque or stenosis. Normal right posterior communicating artery origin. Patent right ICA terminus. Normal right MCA and ACA origins. Right MCA M1 segment, right MCA bifurcation, and right MCA M2 branches are patent. The distal right MCA branches appear symmetric to those on the left Venous sinuses: Patent. Anatomic variants: Mildly dominant distal left vertebral artery. Fetal type right PCA origin. Delayed phase: No abnormal enhancement identified. Stable gray-white matter differentiation throughout the brain. Review of the MIP images confirms the above findings IMPRESSION: 1. Negative for large vessel occlusion and NO discrete Left MCA branch occlusion identified to correspond to the evidence of small anterior left MCA division cortical infarct. This result was communicated to Dr. Lorraine Lax at at 1218 hours by text page via the [facility] messaging system. 2. No arterial atherosclerosis or stenosis in the neck. Mildly irregular appearance of the intracranial arteries diffusely which is nonspecific. No intracranial plaque or hemodynamically significant stenosis identified. 3. No acute findings in the neck and presumed chronic elevation of the left hemidiaphragm responsible for partially visible left lung opacity on the most caudal image. Electronically Signed  By: Genevie Ann M.D.   On: 04/04/2018 12:26   Mr Brain Wo Contrast  Result Date: 04/04/2018 CLINICAL DATA:  Dysarthria.  Left frontal infarct on CT. EXAM: MRI HEAD WITHOUT CONTRAST MRA HEAD WITHOUT CONTRAST TECHNIQUE: Multiplanar, multiecho pulse sequences of the brain and surrounding structures were obtained  without intravenous contrast. Angiographic images of the head were obtained using MRA technique without contrast. COMPARISON:  Head CT and CTA 04/04/2018 and MRI 03/10/2017 FINDINGS: MRI HEAD FINDINGS Brain: There is a small acute cortical infarct in the posterior left frontal lobe corresponding to the abnormality on CT. No intracranial hemorrhage, mass, midline shift, or extra-axial fluid collection is identified. Bilateral cerebral white matter T2 hyperintensities, most notable in the centrum semiovale, are similar to the prior MRI with the appearance of multiple chronic lacunar infarcts. The ventricles are normal in size. Vascular: Major intracranial vascular flow voids are preserved. Skull and upper cervical spine: Unremarkable bone marrow signal. Sinuses/Orbits: Unremarkable orbits. Minimal right frontal sinus mucosal thickening. Clear mastoid air cells. Other: None. MRA HEAD FINDINGS The visualized distal vertebral arteries are widely patent to the basilar and codominant. Patent bilateral PICA, right AICA, and bilateral SCA origins are visualized. The basilar artery is widely patent. Both PCAs are patent without evidence of significant proximal stenosis. There is a fetal origin of the right PCA. The internal carotid arteries are widely patent from skull base to carotid termini. ACAs and MCAs are patent without evidence of proximal branch occlusion or significant stenosis. No aneurysm is identified. IMPRESSION: 1. Small acute posterior left frontal lobe infarct (MCA territory). 2. 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 3. Negative head MRA. Electronically Signed   By: Logan Bores M.D.   On: 04/04/2018 16:38   Mr Jodene Nam Head Wo Contrast  Result Date: 04/04/2018 CLINICAL DATA:  Dysarthria.  Left frontal infarct on CT. EXAM: MRI HEAD WITHOUT CONTRAST MRA HEAD WITHOUT CONTRAST TECHNIQUE: Multiplanar,  multiecho pulse sequences of the brain and surrounding structures were obtained without intravenous contrast. Angiographic images of the head were obtained using MRA technique without contrast. COMPARISON:  Head CT and CTA 04/04/2018 and MRI 03/10/2017 FINDINGS: MRI HEAD FINDINGS Brain: There is a small acute cortical infarct in the posterior left frontal lobe corresponding to the abnormality on CT. No intracranial hemorrhage, mass, midline shift, or extra-axial fluid collection is identified. Bilateral cerebral white matter T2 hyperintensities, most notable in the centrum semiovale, are similar to the prior MRI with the appearance of multiple chronic lacunar infarcts. The ventricles are normal in size. Vascular: Major intracranial vascular flow voids are preserved. Skull and upper cervical spine: Unremarkable bone marrow signal. Sinuses/Orbits: Unremarkable orbits. Minimal right frontal sinus mucosal thickening. Clear mastoid air cells. Other: None. MRA HEAD FINDINGS The visualized distal vertebral arteries are widely patent to the basilar and codominant. Patent bilateral PICA, right AICA, and bilateral SCA origins are visualized. The basilar artery is widely patent. Both PCAs are patent without evidence of significant proximal stenosis. There is a fetal origin of the right PCA. The internal carotid arteries are widely patent from skull base to carotid termini. ACAs and MCAs are patent without evidence of proximal branch occlusion or significant stenosis. No aneurysm is identified. IMPRESSION: 1. Small acute posterior left frontal lobe infarct (MCA territory). 2. 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 3. Negative head  MRA. Electronically Signed   By: Logan Bores M.D.   On: 04/04/2018 16:38   Ct Head Code Stroke Wo Contrast  Result Date: 04/04/2018 CLINICAL DATA:  Code stroke. 47 year old female  with slurred speech this morning. EXAM: CT HEAD WITHOUT CONTRAST TECHNIQUE: Contiguous axial images were obtained from the base of the skull through the vertex without intravenous contrast. COMPARISON:  Brain MRI 03/10/2017.  Head CT 09/15/2016. FINDINGS: Brain: Chronic cerebral white matter hypodense foci as demonstrated on the prior studies appear stable since 2018. Small area of new cortical hypodensity in the left middle frontal gyrus (series 3, image 22). No associated hemorrhage or mass effect. No other changes of acute cortical infarct. Stable cerebral volume. No ventriculomegaly. Basilar cisterns remain normal. Vascular: Mild Calcified atherosclerosis at the skull base. No suspicious intracranial vascular hyperdensity. Skull: Stable and negative. Sinuses/Orbits: Visualized paranasal sinuses and mastoids are stable and well pneumatized. Other: Visualized orbits and scalp soft tissues are within normal limits. ASPECTS [facility] Stroke Program Early CT Score) - Ganglionic level infarction (caudate, lentiform nuclei, internal capsule, insula, M1-M3 cortex): 7 - Supraganglionic infarction (M4-M6 cortex): 2 (abnormal M4 segment). Total score (0-10 with 10 being normal): 9 IMPRESSION: 1. Small area of cytotoxic edema in the left MCA M4 segment without hemorrhage or mass effect. ASPECTS is 9. 2. Underlying chronic bilateral cerebral white matter disease. 3. CTA head and neck reported separately. 4. These results were communicated to Dr. Lorraine Lax at 12:11 pmon 10/22/2019by text page via the [facility] messaging system. Electronically Signed   By: Genevie Ann M.D.   On: 04/04/2018 12:11   LE venous doppler no DVT  TTE pending    PHYSICAL EXAM  Temp:  [97.5 F (36.4 C)-98.4 F (36.9 C)] 98.4 F (36.9 C) (10/23 0726) Pulse Rate:  [84-109] 90 (10/23 0726) Resp:  [14-26] 18 (10/23 0726) BP: (143-189)/(91-121) 151/98 (10/23 0726) SpO2:  [97 %-100 %] 99 % (10/23 0726)  General - Well nourished, well developed, in no  apparent distress.  Ophthalmologic - fundi not visualized due to noncooperation.  Cardiovascular - Regular rate and rhythm.  Mental Status -  Level of arousal and orientation to time, place, and person were intact. Language including naming, and comprehension was assessed and found intact. However, mild to moderate expressive aphasia with mild to moderate dysarthria and difficulty with repetition.   Cranial Nerves II - XII - II - Visual field intact OU. III, IV, VI - Extraocular movements intact. V - Facial sensation decreased on the right, 70-80% of the left. VII - slight nasolabial fold flattening on the right. VIII - Hearing & vestibular intact bilaterally. X - Palate elevates symmetrically. XI - Chin turning & shoulder shrug intact bilaterally. XII - Tongue protrusion intact.  Motor Strength - The patient's strength was normal in all extremities and pronator drift was absent.  Bulk was normal and fasciculations were absent.   Motor Tone - Muscle tone was assessed at the neck and appendages and was normal.  Reflexes - The patient's reflexes were symmetrical in all extremities and she had no pathological reflexes.  Sensory - Light touch, temperature/pinprick were assessed and were decreased on the right, 70-80% of the left.    Coordination - The patient had normal movements in the hands with no ataxia or dysmetria.  Tremor was absent.  Gait and Station - deferred.   ASSESSMENT/PLAN [name] is a 47 y.o. female with history of seizure disorder, questionable for MS, HTN, HLD, anemia, hx of Mumps  admitted for expressive aphasia. No tPA given due to outside window.    Stroke:  left MCA cortical infarct embolic, unclear source  Resultant mild to moderate expressive aphasia  MRI  Left frontal MCA cortical infarct  CTA head and neck unremarkable  2D Echo  Pending  LE venous doppler neg for DVT  Will need TEE to further evaluate  Will check hypercoagulable and  autoimmune work up  LDL 121  HgbA1c 5.9  UDS neg  HIV and RPR pending  SCDs for VTE prophylaxis  Will likely do LP tomorrow  No antithrombotic prior to admission, now on aspirin 81 mg daily. Avoid plavix or lovenox now given LP planned.  Patient counseled to be compliant with her antithrombotic medications  Ongoing aggressive stroke risk factor management  Therapy recommendations:  pending  Disposition:  Pending  ? MS  As per Dr. Tomi Likens "MRI of the brain with and without contrast back on 12/13/00.  Images are not available, but report states "multiple cerebral white matter lesions.  Multiple sclerosis would be the leading consideration.  Other considerations are migraines, vasculitis, and chronic small vessel disease." a lumbar puncture was performed at that time, but CSF analysis was not available  has not had any episodic symptoms to suggest an MS flare and never received IV steroids.   MRI brain, C/T spine in 02/2017 - Multiple foci of cerebral white matter T2 hyperintensity, abnormal for age. This may reflect chronic small vessel ischemia with multiple chronic lacunar infarcts (deep MCA watershed). Demyelinating disease is possible, however there are no lesions specific for multiple sclerosis. No spinal cord lesions  LP was discussed in Dr. Tomi Likens note, but seems not proceeded yet.   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    She was previously on Dilantin and a medication that started with a "Z".    Her maternal grandmother had seizures.   EEG in 02/2017 normal   Not on AEDs at home  Stated no seizure for last 4-5 years except on slight episode 1-2 years ago but can not describe the episode  Plan for LP tomorrow  Hypertension . Stable on the high side . Permissive hypertension (OK if <220/120) for 24-48 hours post stroke and then gradually normalized within 5-7 days.  Long term BP goal  nomortensive  Hyperlipidemia  Home meds:  none   LDL 121, goal < 70  Now on lipitor 20  Continue statin at discharge  Tobacco abuse  Current smoker  Smoking cessation counseling provided  Pt is willing to quit  Other Stroke Risk Factors    Other Active Problems  Leukocytopenia WBC 3.5->3.3  Anemia  Hb 8.2->9.5, unspecific  Hospital day # 1  I spent  35 minutes in total face-to-face time with the patient, more than 50% of which was spent in counseling and coordination of care, reviewing test results, images and medication, and discussing the diagnosis of stroke, cortical stroke, seizure disorder, possible MS, treatment plan and potential prognosis. This patient's care requiresreview of multiple databases, neurological assessment, discussion with family, other specialists and medical decision making of high complexity. I have reviewed extensively her previous clinic chart, discussed extensively with pt and her daughters, the decision is very complicated given young pt with multiple different neuro diagnosis.    Tina Hawking, MD PhD Stroke Neurology 04/05/2018 12:02 PM    To contact Stroke Continuity provider, please refer to [email or web address]  After hours, contact General Neurology

## 2018-04-06 ENCOUNTER — Inpatient Hospital Stay (HOSPITAL_COMMUNITY): Payer: Self-pay

## 2018-04-06 ENCOUNTER — Encounter (HOSPITAL_COMMUNITY): Payer: Self-pay

## 2018-04-06 ENCOUNTER — Encounter (HOSPITAL_COMMUNITY)
Admission: EM | Disposition: A | Discharge status: Rehabilitation Facility | Payer: Self-pay | Source: Home / Self Care | Attending: Internal Medicine

## 2018-04-06 DIAGNOSIS — I63512 Cerebral infarction due to unspecified occlusion or stenosis of left middle cerebral artery: Secondary | ICD-10-CM

## 2018-04-06 DIAGNOSIS — I639 Cerebral infarction, unspecified: Secondary | ICD-10-CM

## 2018-04-06 HISTORY — PX: TEE WITHOUT CARDIOVERSION: SHX5443

## 2018-04-06 LAB — CBC WITH DIFFERENTIAL/PLATELET
Abs Immature Granulocytes: 0.01 10*3/uL (ref 0.00–0.07)
Basophils Absolute: 0 10*3/uL (ref 0.0–0.1)
Basophils Relative: 1 %
Eosinophils Absolute: 0.1 10*3/uL (ref 0.0–0.5)
Eosinophils Relative: 2 %
HCT: 33.4 % — ABNORMAL LOW (ref 36.0–46.0)
Hemoglobin: 9.7 g/dL — ABNORMAL LOW (ref 12.0–15.0)
IMMATURE GRANULOCYTES: 0 %
Lymphocytes Relative: 35 %
Lymphs Abs: 1.3 10*3/uL (ref 0.7–4.0)
MCH: 20.9 pg — ABNORMAL LOW (ref 26.0–34.0)
MCHC: 29 g/dL — ABNORMAL LOW (ref 30.0–36.0)
MCV: 72 fL — AB (ref 80.0–100.0)
MONO ABS: 0.5 10*3/uL (ref 0.1–1.0)
MONOS PCT: 12 %
NEUTROS ABS: 1.9 10*3/uL (ref 1.7–7.7)
NEUTROS PCT: 50 %
PLATELETS: 287 10*3/uL (ref 150–400)
RBC: 4.64 MIL/uL (ref 3.87–5.11)
RDW: 22.9 % — ABNORMAL HIGH (ref 11.5–15.5)
WBC: 3.8 10*3/uL — ABNORMAL LOW (ref 4.0–10.5)
nRBC: 0 % (ref 0.0–0.2)

## 2018-04-06 LAB — BASIC METABOLIC PANEL
Anion gap: 6 (ref 5–15)
BUN: 12 mg/dL (ref 6–20)
CALCIUM: 9.4 mg/dL (ref 8.9–10.3)
CO2: 26 mmol/L (ref 22–32)
CREATININE: 0.62 mg/dL (ref 0.44–1.00)
Chloride: 107 mmol/L (ref 98–111)
GFR calc Af Amer: >60 mL/min (ref >60)
GFR calc non Af Amer: >60 mL/min (ref >60)
Glucose, Bld: 108 mg/dL — ABNORMAL HIGH (ref 70–99)
Potassium: 3.6 mmol/L (ref 3.5–5.1)
Sodium: 139 mmol/L (ref 135–145)

## 2018-04-06 LAB — CSF CELL COUNT WITH DIFFERENTIAL
RBC COUNT CSF: 114 /mm3 — AB
RBC Count, CSF: 142 /mm3 — ABNORMAL HIGH
Tube #: 2
Tube #: 4
WBC, CSF: 1 /mm3 (ref 0–5)
WBC, CSF: 1 /mm3 (ref 0–5)

## 2018-04-06 LAB — PROTEIN AND GLUCOSE, CSF
Glucose, CSF: 68 mg/dL (ref 40–70)
Total  Protein, CSF: 34 mg/dL (ref 15–45)

## 2018-04-06 LAB — SEDIMENTATION RATE: SED RATE: 9 mm/h (ref 0–22)

## 2018-04-06 LAB — CRYPTOCOCCAL ANTIGEN, CSF
CRYPTO AG: NEGATIVE
CRYPTOCOCCAL AG TITER: NEGATIVE — AB

## 2018-04-06 LAB — C-REACTIVE PROTEIN: CRP: <0.8 mg/dL (ref <1.0)

## 2018-04-06 LAB — MAGNESIUM: Magnesium: 2 mg/dL (ref 1.7–2.4)

## 2018-04-06 LAB — RPR: RPR: NONREACTIVE

## 2018-04-06 SURGERY — ECHOCARDIOGRAM, TRANSESOPHAGEAL
Anesthesia: Moderate Sedation

## 2018-04-06 MED ORDER — MIDAZOLAM HCL 10 MG/2ML IJ SOLN
INTRAMUSCULAR | Status: DC | PRN
  Administered 2018-04-06: 2 mg via INTRAVENOUS
  Administered 2018-04-06 (×3): 1 mg via INTRAVENOUS

## 2018-04-06 MED ORDER — FENTANYL CITRATE (PF) 100 MCG/2ML IJ SOLN
INTRAMUSCULAR | Status: AC
  Filled 2018-04-06: qty 2

## 2018-04-06 MED ORDER — FENTANYL CITRATE (PF) 100 MCG/2ML IJ SOLN
INTRAMUSCULAR | Status: DC | PRN
  Administered 2018-04-06 (×3): 25 ug via INTRAVENOUS

## 2018-04-06 MED ORDER — MIDAZOLAM HCL 5 MG/ML IJ SOLN
INTRAMUSCULAR | Status: AC
  Filled 2018-04-06: qty 2

## 2018-04-06 MED ORDER — DIPHENHYDRAMINE HCL 50 MG/ML IJ SOLN
INTRAMUSCULAR | Status: AC
  Filled 2018-04-06: qty 1

## 2018-04-06 MED ORDER — BUTAMBEN-TETRACAINE-BENZOCAINE 2-2-14 % EX AERO
INHALATION_SPRAY | CUTANEOUS | Status: DC | PRN
  Administered 2018-04-06: 2 via TOPICAL

## 2018-04-06 MED ORDER — LIDOCAINE VISCOUS HCL 2 % MT SOLN
OROMUCOSAL | Status: AC
  Filled 2018-04-06: qty 15

## 2018-04-06 NOTE — Plan of Care (Signed)
  Problem: Education: Goal: Knowledge of General Education information will improve Description Including pain rating scale, medication(s)/side effects and non-pharmacologic comfort measures Outcome: Progressing   Problem: Clinical Measurements: Goal: Diagnostic test results will improve Outcome: Progressing Goal: Respiratory complications will improve Outcome: Progressing Goal: Cardiovascular complication will be avoided Outcome: Progressing   Problem: Pain Managment: Goal: General experience of comfort will improve Outcome: Progressing   Problem: Safety: Goal: Ability to remain free from injury will improve Outcome: Progressing   Problem: Education: Goal: Knowledge of disease or condition will improve Outcome: Progressing Goal: Knowledge of secondary prevention will improve Outcome: Progressing Goal: Knowledge of patient specific risk factors addressed and post discharge goals established will improve Outcome: Progressing   Problem: Coping: Goal: Will verbalize positive feelings about self Outcome: Progressing   Problem: Health Behavior/Discharge Planning: Goal: Ability to manage health-related needs will improve Outcome: Progressing

## 2018-04-06 NOTE — Progress Notes (Signed)
Physical Therapy Treatment Patient Details Name: Tina Moss MRN: [id number] DOB: May 22, 1971 Today's Date: 04/06/2018    History of Present Illness pt is a 47 y/o female with pmh significant for epilepsy, HTN, HLD, tobacco use, admitted to ED due to having slurred speech and right sided hemiparesis.  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    PT Comments    Pt continues to progress towards goals. Increased gait tolerance, however, presenting with decreased steadiness, and weakness. Required min to mod A with use of RW, with increased support needed as pt fatigued. Required seated rest in between gait trials. Feel current recommendations appropriate as pt very motivated to return to independence. Will continue to follow acutely to maximize functional mobility independence and safety.    Follow Up Recommendations  CIR;Supervision/Assistance - 24 hour     Equipment Recommendations  Rolling walker with 5" wheels    Recommendations for Other Services Rehab consult     Precautions / Restrictions Precautions Precautions: Fall Restrictions Weight Bearing Restrictions: No    Mobility  Bed Mobility Overal bed mobility: Needs Assistance Bed Mobility: Supine to Sit;Sit to Supine     Supine to sit: Min guard Sit to supine: Min assist   General bed mobility comments: Min guard and increased time to come to EOB. Min A for LE assist to return to supine.   Transfers Overall transfer level: Needs assistance Equipment used: Rolling walker (2 wheeled) Transfers: Sit to/from [facility] Sit to Stand: Min assist;Mod assist Stand pivot transfers: Min assist       General transfer comment: Min to mod A for lift assist and steadying. Performed X3 during session. With increased fatigue, pt requiring increased support. Verbal cues for safe hand placement during all transfers, as pt wanting to pull up on RW.    Ambulation/Gait Ambulation/Gait assistance: Min assist;+2 safety/equipment(chair follow ) Gait Distance (Feet): 25 Feet(X2) Assistive device: Rolling walker (2 wheeled) Gait Pattern/deviations: Step-to pattern;Narrow base of support;Decreased dorsiflexion - right Gait velocity: slow Gait velocity interpretation: <1.31 ft/sec, indicative of household ambulator General Gait Details: Pt with heavy use of UEs. Narrow BOS noted, especially during turns and required cues for appropriate BOS. Cues for heel strike as well. Required seated rest in between gait trials.    Stairs             Wheelchair Mobility    Modified Rankin (Stroke Patients Only) Modified Rankin (Stroke Patients Only) Pre-Morbid Rankin Score: No symptoms Modified Rankin: Moderately severe disability     Balance Overall balance assessment: Needs assistance Sitting-balance support: No upper extremity supported;Feet supported Sitting balance-Leahy Scale: Good     Standing balance support: Bilateral upper extremity supported Standing balance-Leahy Scale: Poor Standing balance comment: reliance on the RW and external support.                            Cognition Arousal/Alertness: Awake/alert Behavior During Therapy: WFL for tasks assessed/performed Overall Cognitive Status: Impaired/Different from baseline Area of Impairment: Safety/judgement;Problem solving;Following commands                       Following Commands: Follows one step commands with increased time Safety/Judgement: Decreased awareness of safety   Problem Solving: Slow processing;Requires verbal cues;Requires tactile cues        Exercises      General Comments General comments (skin integrity, edema, etc.): Pt's daughter's boyfriend present  during session. Educated about needs for further rehab following d/c       Pertinent Vitals/Pain Pain Assessment: No/denies pain    Home Living                       Prior Function            PT Goals (current goals can now be found in the care plan section) Acute Rehab PT Goals Patient Stated Goal: back to work PT Goal Formulation: With patient Time For Goal Achievement: 04/12/18 Potential to Achieve Goals: Good Progress towards PT goals: Progressing toward goals    Frequency    Min 4X/week      PT Plan Current plan remains appropriate    Co-evaluation              AM-PAC PT "6 Clicks" Daily Activity  Outcome Measure  Difficulty turning over in bed (including adjusting bedclothes, sheets and blankets)?: A Little Difficulty moving from lying on back to sitting on the side of the bed? : Unable Difficulty sitting down on and standing up from a chair with arms (e.g., wheelchair, bedside commode, etc,.)?: Unable Help needed moving to and from a bed to chair (including a wheelchair)?: A Little Help needed walking in hospital room?: A Little Help needed climbing 3-5 steps with a railing? : A Lot 6 Click Score: 13    End of Session Equipment Utilized During Treatment: Gait belt Activity Tolerance: Patient tolerated treatment well Patient left: in bed;with call bell/phone within reach;Other (comment)(with CIR admissions coordinator in room) Nurse Communication: Mobility status PT Visit Diagnosis: Unsteadiness on feet (R26.81);Other abnormalities of gait and mobility (R26.89);Hemiplegia and hemiparesis Hemiplegia - Right/Left: Right Hemiplegia - caused by: Cerebral infarction     Time: 1413-1430 PT Time Calculation (min) (ACUTE ONLY): 17 min  Charges:  $Gait Training: 8-22 mins                     Leighton Ruff, PT, DPT  Acute Rehabilitation Services  Pager: [phone] Office: [phone]    Rudean Hitt 04/06/2018, 4:02 PM

## 2018-04-06 NOTE — CV Procedure (Signed)
    Transesophageal Echocardiogram Note  Tina Moss [id number] 09-07-1970  Procedure: Transesophageal Echocardiogram Indications: CVA  Procedure Details Consent: Obtained Time Out: Verified patient identification, verified procedure, site/side was marked, verified correct patient position, special equipment/implants available, Radiology Safety Procedures followed,  medications/allergies/relevent history reviewed, required imaging and test results available.  Performed  Medications:  During this procedure the patient is administered a total of Versed 5 mg and Fentanyl 75 mcg  to achieve and maintain moderate conscious sedation.  The patient's heart rate, blood pressure, and oxygen saturation are monitored continuously during the procedure. The period of conscious sedation is 30 minutes, of which I was present face-to-face 100% of this time.  Normal LV function; negative saline microcavitation study.   Complications: No apparent complications Patient did tolerate procedure well.  Kirk Ruths, MD

## 2018-04-06 NOTE — PMR Pre-admission (Signed)
PMR Admission Coordinator Pre-Admission Assessment  Patient: Tina Moss is an 47 y.o., female MRN: [id number] DOB: [birth year] Height: 5\' 7"  (170.2 cm) Weight:                Insurance Information HMO:     PPO:      PCP:      IPA:      80/20:      OTHER:  PRIMARY: Pt is uninsured; self pay      Policy#:       Subscriber:  CM Name:       Phone#:      Fax#:  Pre-Cert#:       Employer:  Benefits:  Phone #:      Name:  Eff. Date:      Deduct:       Out of Pocket Max:       Life Max:  CIR:       SNF:  Outpatient:      Co-Pay:  Home Health:       Co-Pay:  DME:      Co-Pay:  Providers: Pt is aware of estimated daily cost of care ($3,300). Pt has been assigned a Development worker, community and has been provided that phone number to assist as needed.  SECONDARY:       Policy#:       Subscriber:  CM Name:       Phone#:      Fax#:  Pre-Cert#:       Employer:  Benefits:  Phone #:      Name:  Eff. Date:      Deduct:       Out of Pocket Max:       Life Max:  CIR:       SNF:  Outpatient:      Co-Pay:  Home Health:       Co-Pay:  DME:      Co-Pay:   Medicaid Application Date:      Case Manager:  Disability Application Date:       Case Worker:   Emergency Contact Information Contact Information    Name Relation Home Work Mobile   Smith,Marie Mother [phone]     Christy Sartorius Significant other   [phone]     Current Medical History  Patient Admitting Diagnosis: left frontal, MCA infarct with right hemiparesis and aphasia History of Present Illness: Tina Moss is a 47 year old right-handed female with history of epilepsy, hypertension, hyperlipidemia and seizure disorder in the past and seizure free for 4-5 years with medical noncompliance due to financial issues.  She has seen Dr. Tomi Likens in the past concerning for MS but no LP was done.  Presented 04/04/2018 with right-sided weakness and aphasia.  Per chart review patient lives with boyfriend.  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  One level home.  Cranial CT scan showed small area of cytotoxic edema in the left MCA M4 segment without hemorrhage or mass-effect.  Underlying chronic bilateral cerebral white matter disease.  CT angiogram of head and neck negative for large vessel occlusion.  Patient did not receive TPA.  Urine drug screen negative.  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  Negative head MRA.  Venous Doppler studies lower extremity negative.  Echocardiogram with ejection fraction 65% no wall motion abnormalities.  TEE normal LV function, negative saline micro cavitation study/no PFO.  An LP was completed 04/06/2018 to assess for CNS pathology as she  had seen neurology in the past for questionable MS but no work-up was initiated at that time and results remain pending.  Currently on aspirin/Plavix for CVA prophylaxis.  Therapy evaluations completed with recommendations of physical medicine rehab consult.  Patient is to be admitted for a comprehensive rehab program on 04/07/18. Complete NIHSS TOTAL: 3    Past Medical History  Past Medical History:  Diagnosis Date  . Anemia   . Epilepsy ([place])   . Fibroids   . H/O bacterial infection   . H/O mumps   . Hyperlipidemia   . Hypertension   . Seizures ([name])     Family History  family history includes Arthritis in her sister; Deafness in her brother; Hypertension in her brother, mother, and sister; Mental illness in her brother and daughter; Scoliosis in her son; Speech disorder in her brother.  Prior Rehab/Hospitalizations:  Has the patient had major surgery during 100 days prior to admission? No  Current Medications   Current Facility-Administered Medications:  .  acetaminophen (TYLENOL) tablet 650 mg, 650 mg, Oral, Q4H PRN, 650 mg at 04/04/18 1925 **OR** acetaminophen (TYLENOL) solution 650 mg, 650 mg, Per Tube, Q4H PRN **OR** acetaminophen (TYLENOL) suppository 650 mg, 650 mg, Rectal, Q4H PRN, Lelon Perla,  MD .  aspirin EC tablet 81 mg, 81 mg, Oral, Daily, Lelon Perla, MD, 81 mg at 04/06/18 1000 .  atorvastatin (LIPITOR) tablet 20 mg, 20 mg, Oral, q1800, Lelon Perla, MD, 20 mg at 04/06/18 1711 .  hydrALAZINE (APRESOLINE) injection 5 mg, 5 mg, Intravenous, Q4H PRN, Lelon Perla, MD .  ondansetron (ZOFRAN) tablet 4 mg, 4 mg, Oral, Q6H PRN **OR** ondansetron (ZOFRAN) injection 4 mg, 4 mg, Intravenous, Q6H PRN, Stanford Breed, Denice Bors, MD  Patients Current Diet:  Diet Order            Diet Heart Room service appropriate? Yes; Fluid consistency: Thin  Diet effective now              Precautions / Restrictions Precautions Precautions: Fall Restrictions Weight Bearing Restrictions: No   Has the patient had 2 or more falls or a fall with injury in the past year?No  Prior Activity Level Community (5-7x/wk): active; worked full time; drove Radiographer, therapeutic / Paramedic Devices/Equipment: None Home Equipment: Grab bars - tub/shower  Prior Device Use: Indicate devices/aids used by the patient prior to current illness, exacerbation or injury? None of the above  Prior Functional Level Prior Function Level of Independence: Independent Comments: works as a Secretary/administrator, I, drives, runs errands.  Self Care: Did the patient need help bathing, dressing, using the toilet or eating?  Independent  Indoor Mobility: Did the patient need assistance with walking from room to room (with or without device)? Independent  Stairs: Did the patient need assistance with internal or external stairs (with or without device)? Independent  Functional Cognition: Did the patient need help planning regular tasks such as shopping or remembering to take medications? Independent  Current Functional Level Cognition  Arousal/Alertness: Awake/alert Overall Cognitive Status: Impaired/Different from baseline Orientation Level: Oriented X4 Following Commands: Follows one step  commands with increased time Safety/Judgement: Decreased awareness of safety Attention: Sustained, Selective Sustained Attention: Appears intact Selective Attention: Appears intact Memory: Appears intact(able to recall 5 items slp was bringing her within 5 minutes) Awareness: Appears intact Problem Solving: Appears intact(need to use call bell due to unsteadiness on her feet) Safety/Judgment: Appears intact    Extremity Assessment (includes Sensation/Coordination)  Upper Extremity Assessment: Generalized weakness, RUE deficits/detail RUE Deficits / Details: increased weakness noted (R>L); decreased fine motor; will continue to assess RUE Sensation: decreased light touch RUE Coordination: decreased fine motor, decreased gross motor  Lower Extremity Assessment: Defer to PT evaluation RLE Deficits / Details: some isolated movement, but also some synergistic movement.  >3+ overall RLE Sensation: decreased light touch RLE Coordination: decreased fine motor    ADLs  Overall ADL's : Needs assistance/impaired Eating/Feeding: Set up, Sitting Grooming: Minimal assistance, Sitting Upper Body Bathing: Min guard, Minimal assistance, Sitting Lower Body Bathing: Moderate assistance, Sit to/from stand Upper Body Dressing : Minimal assistance, Sitting Lower Body Dressing: Moderate assistance, Sit to/from stand Lower Body Dressing Details (indicate cue type and reason): minA standing balance Toilet Transfer: Minimal assistance, Ambulation, Grab bars, RW Toileting- Clothing Manipulation and Hygiene: Minimal assistance, Sit to/from stand Toileting - Clothing Manipulation Details (indicate cue type and reason): assist for management of gown/mesh briefs; pt performing peri-care seated on toilet Functional mobility during ADLs: Minimal assistance, Rolling walker    Mobility  Overal bed mobility: Needs Assistance Bed Mobility: Supine to Sit, Sit to Supine Supine to sit: Min guard Sit to supine: Min  assist General bed mobility comments: Min guard and increased time to come to EOB. Min A for LE assist to return to supine.     Transfers  Overall transfer level: Needs assistance Equipment used: Rolling walker (2 wheeled) Transfers: Sit to/from Stand, [employer] Transfers Sit to Stand: Min assist, Mod assist Stand pivot transfers: Min assist General transfer comment: Min to mod A for lift assist and steadying. Performed X3 during session. With increased fatigue, pt requiring increased support. Verbal cues for safe hand placement during all transfers, as pt wanting to pull up on RW.     Ambulation / Gait / Stairs / Wheelchair Mobility  Ambulation/Gait Ambulation/Gait assistance: Min assist, +2 safety/equipment(chair follow ) Gait Distance (Feet): 25 Feet(X2) Assistive device: Rolling walker (2 wheeled) Gait Pattern/deviations: Step-to pattern, Narrow base of support, Decreased dorsiflexion - right General Gait Details: Pt with heavy use of UEs. Narrow BOS noted, especially during turns and required cues for appropriate BOS. Cues for heel strike as well. Required seated rest in between gait trials.  Gait velocity: slow Gait velocity interpretation: <1.31 ft/sec, indicative of household ambulator    Posture / Balance Dynamic Sitting Balance Sitting balance - Comments: can accept min/mod challenge with less tolerance accepting challenge right and forward. Balance Overall balance assessment: Needs assistance Sitting-balance support: No upper extremity supported, Feet supported Sitting balance-Leahy Scale: Good Sitting balance - Comments: can accept min/mod challenge with less tolerance accepting challenge right and forward. Standing balance support: Bilateral upper extremity supported Standing balance-Leahy Scale: Poor Standing balance comment: reliance on the RW and external support.    Special needs/care consideration BiPAP/CPAP: no CPM: no Continuous Drip IV: no Dialysis: no         Days: no Life Vest: no Oxygen: no Special Bed: no Trach Size: no Wound Vac (area): no      Location: no Skin: no areas of concern                         Bowel mgmt:continent; last BM 04/04/18  Bladder mgmt: continent Diabetic mgmt: no, per pt report     Previous Home Environment Living Arrangements: Spouse/significant other Available Help at Discharge: Family, Other (Comment)(as needed) Type of Home: Apartment Home Layout: One level Home Access: Stairs to enter  Entrance Stairs-Rails: Right, Left Entrance Stairs-Number of Steps: flight Bathroom Shower/Tub: Chiropodist: Standard Home Care Services: No  Discharge Living Setting Plans for Discharge Living Setting: Patient's home, Lives with (comment)(boyfriend) Type of Home at Discharge: Apartment Discharge Home Layout: Other (Comment)(2nd floor apartment) Discharge Home Access: Stairs to enter Entrance Stairs-Rails: Right Entrance Stairs-Number of Steps: 10-12 Discharge Bathroom Shower/Tub: Tub/shower unit Discharge Bathroom Toilet: Standard Discharge Bathroom Accessibility: Yes How Accessible: Accessible via walker Does the patient have any problems obtaining your medications?: No  Social/Family/Support Systems Patient Roles: Partner, Parent Contact Information: emergency contact; Mother Lelan Pons) [phone]; Boyfriend Karma Greaser): [phone] Anticipated Caregiver: boyfriend; daughter can assist as needed Anticipated Caregiver's Contact Information: see above Ability/Limitations of Caregiver: aniticipate intermittant Min A Caregiver Availability: Intermittent Discharge Plan Discussed with Primary Caregiver: Yes Is Caregiver In Agreement with Plan?: Yes Does Caregiver/Family have Issues with Lodging/Transportation while Pt is in Rehab?: No   Goals/Additional Needs Patient/Family Goal for Rehab: PT/OT: Mod I; SLP: Supervision Expected length of stay: 13-17 days Cultural Considerations: NA Dietary  Needs: heart healthy, thin liquids Equipment Needs: TBD Pt/Family Agrees to Admission and willing to participate: Yes Program Orientation Provided & Reviewed with Pt/Caregiver Including Roles  & Responsibilities: Yes(with pt and boyfriend)  Barriers to Discharge: Home environment access/layout, Lack of/limited family support  Barriers to Discharge Comments: 10-12 steps to enter; intermittant assist   Decrease burden of Care through IP rehab admission: NA   Possible need for SNF placement upon discharge: not anticipated; pt has good social support at home and has good prognosis for further progress.    Patient Condition: This patient's medical and functional status has changed since the consult dated: 04/06/18 in which the Rehabilitation Physician determined and documented that the patient's condition is appropriate for intensive rehabilitative care in an inpatient rehabilitation facility. Medical changes are: completion of lumbar puncture.  Functional changes are: Min A transfers and Min A x2 140 feet. After evaluating the patient today and speaking with the Rehabilitation physician and acute team, the patient remains appropriate for inpatient rehab. Will admit to inpatient rehab today.  Preadmission Screen Completed By:  Jhonnie Garner, 04/06/2018 5:40 PM ______________________________________________________________________   Discussed status with Dr. Naaman Plummer on 04/07/18 at 12:33PM and received telephone approval for admission today.  Admission Coordinator:  Carrie Usery, time 12:33PM/Date 04/07/18

## 2018-04-06 NOTE — Procedures (Signed)
Procedure Note: Lumbar puncture  Indications: assess for CNS pathology   Operator: Rosalin Hawking, MD, PhD  Others present: RN  Indications, risks, and benefits explained to patient / surrogate decision maker and informed consent obtained. Time-out was performed, with all individuals present agreeing on the procedure to be performed, the site of procedure, and the patient identity.  Patient positioned, prepped and draped in usual sterile fashion. L3-4 space located using bilateral iliac crests as landmarks. 1% Lidocaine without epinephrine was used to anesthetize the area. An 20G spinal needle was introduced into the sub- arachnoid space. Stylet was removed with appropriate fluid return. Needle removed after adequate fluid collected. Blood loss was minimal. A dry guaze dressing was placed over insertion site. Patient tolerated the procedure well and no complications were observed. Spontaneous movement of bilateral extremities were observed after the procedure.  Opening pressure: not measured  Total fluid removed: 10cc  Color of fluid: clear  Sent for: cell count + diff , gram stain, cultures, glucose, protein, crypto antigen, VDRL, oligoclonal band  Rosalin Hawking, MD PhD Stroke Neurology 04/06/2018 6:57 PM

## 2018-04-06 NOTE — Progress Notes (Signed)
Inpatient Rehabilitation-Admissions Coordinator    Met with patient at the bedside to discuss team's recommendation for inpatient rehabilitation. Shared booklets, expectations while in CIR, expected length of stay, and anticipated functional level at DC. Noted pt is currently uninsured and would be self pay for CIR. 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 Pt unsure if she can afford CIR but wanted to discuss her options with her boyfriend and family prior to making a decision. AC has provided pt with letter discussing daily cost of care and has been provided with her assigned financial counselor's information.   AC will follow up with pt tomorrow regarding her decision for post acute rehab.   Jhonnie Garner, OTR/L  Rehab Admissions Coordinator  [phone] 04/06/2018 2:51 PM

## 2018-04-06 NOTE — Progress Notes (Signed)
STROKE TEAM PROGRESS NOTE   SUBJECTIVE (INTERVAL HISTORY) Her RN is at bedside. She is sitting in bed with improved expressive aphasia. Able to speak more fluent. Had TEE today unremarkable, no PFO. Pending LP this afternoon.    OBJECTIVE Temp:  [97.8 F (36.6 C)-98.4 F (36.9 C)] 98.4 F (36.9 C) (10/24 1641) Pulse Rate:  [79-100] 94 (10/24 1641) Cardiac Rhythm: Normal sinus rhythm (10/24 0704) Resp:  [15-24] 16 (10/24 1641) BP: (125-208)/(83-116) 125/83 (10/24 1641) SpO2:  [94 %-100 %] 99 % (10/24 1641)  No results for input(s): GLUCAP in the last 168 hours. Recent Labs  Lab 04/04/18 1108 04/04/18 1128 04/06/18 0423  NA 141 144 139  K 3.9 4.1 3.6  CL 116* 112* 107  CO2 21*  --  26  GLUCOSE 87 84 108*  BUN _0 CREATININE 0.48 0.40* 0.62  CALCIUM 7.7*  --  9.4  MG  --   --  2.0   Recent Labs  Lab 04/04/18 1108  AST 26  ALT 19  ALKPHOS 76  BILITOT 0.7  PROT 6.2*  ALBUMIN 3.3*   Recent Labs  Lab 04/04/18 1108 04/04/18 1128 04/06/18 0423  WBC 3.3*  --  3.8*  NEUTROABS 1.7  --  1.9  HGB 8.2* 9.5* 9.7*  HCT 28.1* 28.0* 33.4*  MCV 73.0*  --  72.0*  PLT 254  --  287   No results for input(s): CKTOTAL, CKMB, CKMBINDEX, TROPONINI in the last 168 hours. Recent Labs    04/04/18 1108  LABPROT 14.0  INR 1.09   Recent Labs    04/04/18 1258  COLORURINE STRAW*  LABSPEC 1.025  PHURINE 6.0  GLUCOSEU NEGATIVE  HGBUR NEGATIVE  BILIRUBINUR NEGATIVE  KETONESUR NEGATIVE  PROTEINUR NEGATIVE  NITRITE NEGATIVE  LEUKOCYTESUR NEGATIVE       Component Value Date/Time   CHOL 206 (H) 04/05/2018 0535   TRIG 63 04/05/2018 0535   HDL 72 04/05/2018 0535   CHOLHDL 2.9 04/05/2018 0535   VLDL 13 04/05/2018 0535   LDLCALC 121 (H) 04/05/2018 0535   Lab Results  Component Value Date   HGBA1C 5.9 (H) 04/05/2018      Component Value Date/Time   LABOPIA NONE DETECTED 04/04/2018 1258   COCAINSCRNUR NONE DETECTED 04/04/2018 1258   LABBENZ NONE DETECTED 04/04/2018  1258   AMPHETMU NONE DETECTED 04/04/2018 1258   THCU NONE DETECTED 04/04/2018 1258   LABBARB NONE DETECTED 04/04/2018 1258    Recent Labs  Lab 04/04/18 1121  ETH <10    I have personally reviewed the radiological images below and agree with the radiology interpretations.  Ct Angio Head W Or Wo Contrast  Result Date: 04/04/2018 CLINICAL DATA:  Code stroke. 47 year old female with slurred speech this morning. ASPECTS 9 with abnormal left MCA M4 segment on plain head CT. EXAM: CT ANGIOGRAPHY HEAD AND NECK TECHNIQUE: Multidetector CT imaging of the head and neck was performed using the standard protocol during bolus administration of intravenous contrast. Multiplanar CT image reconstructions and MIPs were obtained to evaluate the vascular anatomy. Carotid stenosis measurements (when applicable) are obtained utilizing NASCET criteria, using the distal internal carotid diameter as the denominator. CONTRAST:  6m ISOVUE-370 IOPAMIDOL (ISOVUE-370) INJECTION 76% COMPARISON:  Head CT 1140 hours today. Chest radiographs 03/29/2017. FINDINGS: CTA NECK Skeleton: No acute osseous abnormality identified. Upper chest: Partially visible left lung density on series 11, image 160 5 May be due to elevated left hemidiaphragm. Otherwise negative visible lung parenchyma. No superior mediastinal lymphadenopathy.  Other neck: Negative, no neck mass or lymphadenopathy. Aortic arch: 3 vessel arch configuration with minimal arch atherosclerosis. Right carotid system: No brachiocephalic artery or right CCA origin plaque or stenosis. Mildly tortuous proximal right CCA. Negative right carotid bifurcation and cervical right ICA. Left carotid system: Normal left CCA origin. Mildly tortuous proximal left CCA. Negative left carotid bifurcation. Negative cervical left ICA. Vertebral arteries: Tortuous proximal right subclavian artery with a mildly kinked appearance but no atherosclerosis. Normal right vertebral artery origin. Patent  right vertebral artery to the skull base without stenosis. Mild proximal left subclavian artery atherosclerosis without stenosis. Normal left vertebral artery origin. Patent left vertebral artery to the skull base without stenosis. CTA HEAD Posterior circulation: Patent distal vertebral arteries, the right is somewhat non dominant B on the patent right PICA origin. Normal left PICA origin. No distal vertebral scratched at there is distal vertebral artery irregularity but no associated stenosis. Patent vertebrobasilar junction and basilar artery with similar mild irregularity but no basilar stenosis. Fetal type right PCA origin. SCA and left PCA origins are within normal limits. Left Posterior communicating artery is diminutive or absent. The bilateral PCA branches are patent and symmetric with mild irregularity. Anterior circulation: Patent left ICA siphon without stenosis. Patent left ICA terminus. Left MCA and ACA origins are normal. Normal left A1 segment. Anterior communicating artery and bilateral ACA branches are within normal limits. Left MCA M1 segment is patent and bifurcates early. Left MCA bifurcation is patent without stenosis. The left MCA M2 branches are patent. No discrete left MCA branch occlusion is identified. Patent right ICA siphon with no plaque or stenosis. Normal right posterior communicating artery origin. Patent right ICA terminus. Normal right MCA and ACA origins. Right MCA M1 segment, right MCA bifurcation, and right MCA M2 branches are patent. The distal right MCA branches appear symmetric to those on the left Venous sinuses: Patent. Anatomic variants: Mildly dominant distal left vertebral artery. Fetal type right PCA origin. Delayed phase: No abnormal enhancement identified. Stable gray-white matter differentiation throughout the brain. Review of the MIP images confirms the above findings IMPRESSION: 1. Negative for large vessel occlusion and NO discrete Left MCA branch occlusion  identified to correspond to the evidence of small anterior left MCA division cortical infarct. This result was communicated to Dr. Lorraine Lax at at 1218 hours by text page via the [facility] messaging system. 2. No arterial atherosclerosis or stenosis in the neck. Mildly irregular appearance of the intracranial arteries diffusely which is nonspecific. No intracranial plaque or hemodynamically significant stenosis identified. 3. No acute findings in the neck and presumed chronic elevation of the left hemidiaphragm responsible for partially visible left lung opacity on the most caudal image. Electronically Signed   By: Genevie Ann M.D.   On: 04/04/2018 12:26   Dg Chest 2 View  Result Date: 04/04/2018 CLINICAL DATA:  Acute stroke, slurred speech, right-sided weakness and numbness EXAM: CHEST - 2 VIEW COMPARISON:  Chest x-ray of 03/29/2017 FINDINGS: There is little change in chronic elevation of the left hemidiaphragm. No pneumonia or effusion is seen. Mediastinal and hilar contours are unremarkable and the heart is within normal limits in size. No bony abnormality is seen. IMPRESSION: No active cardiopulmonary disease. Electronically Signed   By: Ivar Drape M.D.   On: 04/04/2018 15:42   Ct Angio Neck W Or Wo Contrast  Result Date: 04/04/2018 CLINICAL DATA:  Code stroke. 47 year old female with slurred speech this morning. ASPECTS 9 with abnormal left MCA M4 segment on plain  head CT. EXAM: CT ANGIOGRAPHY HEAD AND NECK TECHNIQUE: Multidetector CT imaging of the head and neck was performed using the standard protocol during bolus administration of intravenous contrast. Multiplanar CT image reconstructions and MIPs were obtained to evaluate the vascular anatomy. Carotid stenosis measurements (when applicable) are obtained utilizing NASCET criteria, using the distal internal carotid diameter as the denominator. CONTRAST:  68m ISOVUE-370 IOPAMIDOL (ISOVUE-370) INJECTION 76% COMPARISON:  Head CT 1140 hours today. Chest  radiographs 03/29/2017. FINDINGS: CTA NECK Skeleton: No acute osseous abnormality identified. Upper chest: Partially visible left lung density on series 11, image 160 5 May be due to elevated left hemidiaphragm. Otherwise negative visible lung parenchyma. No superior mediastinal lymphadenopathy. Other neck: Negative, no neck mass or lymphadenopathy. Aortic arch: 3 vessel arch configuration with minimal arch atherosclerosis. Right carotid system: No brachiocephalic artery or right CCA origin plaque or stenosis. Mildly tortuous proximal right CCA. Negative right carotid bifurcation and cervical right ICA. Left carotid system: Normal left CCA origin. Mildly tortuous proximal left CCA. Negative left carotid bifurcation. Negative cervical left ICA. Vertebral arteries: Tortuous proximal right subclavian artery with a mildly kinked appearance but no atherosclerosis. Normal right vertebral artery origin. Patent right vertebral artery to the skull base without stenosis. Mild proximal left subclavian artery atherosclerosis without stenosis. Normal left vertebral artery origin. Patent left vertebral artery to the skull base without stenosis. CTA HEAD Posterior circulation: Patent distal vertebral arteries, the right is somewhat non dominant B on the patent right PICA origin. Normal left PICA origin. No distal vertebral scratched at there is distal vertebral artery irregularity but no associated stenosis. Patent vertebrobasilar junction and basilar artery with similar mild irregularity but no basilar stenosis. Fetal type right PCA origin. SCA and left PCA origins are within normal limits. Left Posterior communicating artery is diminutive or absent. The bilateral PCA branches are patent and symmetric with mild irregularity. Anterior circulation: Patent left ICA siphon without stenosis. Patent left ICA terminus. Left MCA and ACA origins are normal. Normal left A1 segment. Anterior communicating artery and bilateral ACA branches  are within normal limits. Left MCA M1 segment is patent and bifurcates early. Left MCA bifurcation is patent without stenosis. The left MCA M2 branches are patent. No discrete left MCA branch occlusion is identified. Patent right ICA siphon with no plaque or stenosis. Normal right posterior communicating artery origin. Patent right ICA terminus. Normal right MCA and ACA origins. Right MCA M1 segment, right MCA bifurcation, and right MCA M2 branches are patent. The distal right MCA branches appear symmetric to those on the left Venous sinuses: Patent. Anatomic variants: Mildly dominant distal left vertebral artery. Fetal type right PCA origin. Delayed phase: No abnormal enhancement identified. Stable gray-white matter differentiation throughout the brain. Review of the MIP images confirms the above findings IMPRESSION: 1. Negative for large vessel occlusion and NO discrete Left MCA branch occlusion identified to correspond to the evidence of small anterior left MCA division cortical infarct. This result was communicated to Dr. ALorraine Laxat at 1218 hours by text page [facility]. 2. No arterial atherosclerosis or stenosis in the neck. Mildly irregular appearance of the intracranial arteries diffusely which is nonspecific. No intracranial plaque or hemodynamically significant stenosis identified. 3. No acute findings in the neck and presumed chronic elevation of the left hemidiaphragm responsible for partially visible left lung opacity on the most caudal image. Electronically Signed   By: HGenevie AnnM.D.   On: 04/04/2018 12:26   Mr Brain Wo Contrast  Result Date: 04/04/2018 CLINICAL DATA:  Dysarthria.  Left frontal infarct on CT. EXAM: MRI HEAD WITHOUT CONTRAST MRA HEAD WITHOUT CONTRAST TECHNIQUE: Multiplanar, multiecho pulse sequences of the brain and surrounding structures were obtained without intravenous contrast. Angiographic images of the head were obtained using MRA technique without contrast.  COMPARISON:  Head CT and CTA 04/04/2018 and MRI 03/10/2017 FINDINGS: MRI HEAD FINDINGS Brain: There is a small acute cortical infarct in the posterior left frontal lobe corresponding to the abnormality on CT. No intracranial hemorrhage, mass, midline shift, or extra-axial fluid collection is identified. Bilateral cerebral white matter T2 hyperintensities, most notable in the centrum semiovale, are similar to the prior MRI with the appearance of multiple chronic lacunar infarcts. The ventricles are normal in size. Vascular: Major intracranial vascular flow voids are preserved. Skull and upper cervical spine: Unremarkable bone marrow signal. Sinuses/Orbits: Unremarkable orbits. Minimal right frontal sinus mucosal thickening. Clear mastoid air cells. Other: None. MRA HEAD FINDINGS The visualized distal vertebral arteries are widely patent to the basilar and codominant. Patent bilateral PICA, right AICA, and bilateral SCA origins are visualized. The basilar artery is widely patent. Both PCAs are patent without evidence of significant proximal stenosis. There is a fetal origin of the right PCA. The internal carotid arteries are widely patent from skull base to carotid termini. ACAs and MCAs are patent without evidence of proximal branch occlusion or significant stenosis. No aneurysm is identified. IMPRESSION: 1. Small acute posterior left frontal lobe infarct (MCA territory). 2. 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 3. Negative head MRA. Electronically Signed   By: Logan Bores M.D.   On: 04/04/2018 16:38   Mr Jodene Nam Head Wo Contrast  Result Date: 04/04/2018 CLINICAL DATA:  Dysarthria.  Left frontal infarct on CT. EXAM: MRI HEAD WITHOUT CONTRAST MRA HEAD WITHOUT CONTRAST TECHNIQUE: Multiplanar, multiecho pulse sequences of the brain and surrounding structures were obtained without intravenous contrast.  Angiographic images of the head were obtained using MRA technique without contrast. COMPARISON:  Head CT and CTA 04/04/2018 and MRI 03/10/2017 FINDINGS: MRI HEAD FINDINGS Brain: There is a small acute cortical infarct in the posterior left frontal lobe corresponding to the abnormality on CT. No intracranial hemorrhage, mass, midline shift, or extra-axial fluid collection is identified. Bilateral cerebral white matter T2 hyperintensities, most notable in the centrum semiovale, are similar to the prior MRI with the appearance of multiple chronic lacunar infarcts. The ventricles are normal in size. Vascular: Major intracranial vascular flow voids are preserved. Skull and upper cervical spine: Unremarkable bone marrow signal. Sinuses/Orbits: Unremarkable orbits. Minimal right frontal sinus mucosal thickening. Clear mastoid air cells. Other: None. MRA HEAD FINDINGS The visualized distal vertebral arteries are widely patent to the basilar and codominant. Patent bilateral PICA, right AICA, and bilateral SCA origins are visualized. The basilar artery is widely patent. Both PCAs are patent without evidence of significant proximal stenosis. There is a fetal origin of the right PCA. The internal carotid arteries are widely patent from skull base to carotid termini. ACAs and MCAs are patent without evidence of proximal branch occlusion or significant stenosis. No aneurysm is identified. IMPRESSION: 1. Small acute posterior left frontal lobe infarct (MCA territory). 2. 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 3. Negative head MRA. Electronically Signed   By: Logan Bores M.D.   On: 04/04/2018 16:38  Ct Head Code Stroke Wo Contrast  Result Date: 04/04/2018 CLINICAL DATA:  Code stroke. 47 year old female with slurred speech this morning. EXAM: CT HEAD WITHOUT CONTRAST TECHNIQUE: Contiguous axial images were obtained  from the base of the skull through the vertex without intravenous contrast. COMPARISON:  Brain MRI 03/10/2017.  Head CT 09/15/2016. FINDINGS: Brain: Chronic cerebral white matter hypodense foci as demonstrated on the prior studies appear stable since 2018. Small area of new cortical hypodensity in the left middle frontal gyrus (series 3, image 22). No associated hemorrhage or mass effect. No other changes of acute cortical infarct. Stable cerebral volume. No ventriculomegaly. Basilar cisterns remain normal. Vascular: Mild Calcified atherosclerosis at the skull base. No suspicious intracranial vascular hyperdensity. Skull: Stable and negative. Sinuses/Orbits: Visualized paranasal sinuses and mastoids are stable and well pneumatized. Other: Visualized orbits and scalp soft tissues are within normal limits. ASPECTS [facility] Stroke Program Early CT Score) - Ganglionic level infarction (caudate, lentiform nuclei, internal capsule, insula, M1-M3 cortex): 7 - Supraganglionic infarction (M4-M6 cortex): 2 (abnormal M4 segment). Total score (0-10 with 10 being normal): 9 IMPRESSION: 1. Small area of cytotoxic edema in the left MCA M4 segment without hemorrhage or mass effect. ASPECTS is 9. 2. Underlying chronic bilateral cerebral white matter disease. 3. CTA head and neck reported separately. 4. These results were communicated to Dr. Lorraine Lax at 12:11 pmon 10/22/2019by text page via the [facility] messaging system. Electronically Signed   By: Genevie Ann M.D.   On: 04/04/2018 12:11   LE venous doppler no DVT   PHYSICAL EXAM  Temp:  [97.8 F (36.6 C)-98.4 F (36.9 C)] 98.4 F (36.9 C) (10/24 1641) Pulse Rate:  [79-100] 94 (10/24 1641) Resp:  [15-24] 16 (10/24 1641) BP: (125-208)/(83-116) 125/83 (10/24 1641) SpO2:  [94 %-100 %] 99 % (10/24 1641)  General - Well nourished, well developed, in no apparent distress.  Ophthalmologic - fundi not visualized due to noncooperation.  Cardiovascular - Regular rate and  rhythm.  Mental Status -  Level of arousal and orientation to time, place, and person were intact. Language including naming, and comprehension was assessed and found intact. However, mild expressive aphasia with hesitation, mild dysarthria and difficulty with repetition.   Cranial Nerves II - XII - II - Visual field intact OU. III, IV, VI - Extraocular movements intact. V - Facial sensation decreased on the right, 70-80% of the left. VII - slight nasolabial fold flattening on the right. VIII - Hearing & vestibular intact bilaterally. X - Palate elevates symmetrically. XI - Chin turning & shoulder shrug intact bilaterally. XII - Tongue protrusion intact.  Motor Strength - The patient's strength was normal in all extremities and pronator drift was absent.  Bulk was normal and fasciculations were absent.   Motor Tone - Muscle tone was assessed at the neck and appendages and was normal.  Reflexes - The patient's reflexes were symmetrical in all extremities and she had no pathological reflexes.  Sensory - Light touch, temperature/pinprick were assessed and were decreased on the right, 70-80% of the left.    Coordination - The patient had normal movements in the hands with no ataxia or dysmetria.  Tremor was absent.  Gait and Station - deferred.   ASSESSMENT/PLAN Tina Moss is a 47 y.o. female with history of seizure disorder, questionable for MS, HTN, HLD, anemia, hx of Mumps admitted for expressive aphasia. No tPA given due to outside window.    Stroke:  left MCA cortical infarct embolic, unclear source  Resultant mild to moderate expressive aphasia  MRI  Left frontal MCA cortical infarct  CTA head and neck unremarkable  2D Echo EF 60-65%  LE venous doppler neg for DVT  TEE unremarkable, no PFO  hypercoagulable and autoimmune work up pending - ESR and CRP negative  LDL 121  HgbA1c 5.9  UDS neg  HIV and RPR negative  SCDs for VTE prophylaxis  LP will be  performed this afternoon  No antithrombotic prior to admission, now on aspirin 81 mg daily.  Consider DAPT after LP.  Patient counseled to be compliant with her antithrombotic medications  Ongoing aggressive stroke risk factor management  Therapy recommendations:  pending  Disposition:  Pending  ? MS  As per Dr. Tomi Likens "MRI of the brain with and without contrast back on 12/13/00.  Images are not available, but report states "multiple cerebral white matter lesions.  Multiple sclerosis would be the leading consideration.  Other considerations are migraines, vasculitis, and chronic small vessel disease." a lumbar puncture was performed at that time, but CSF analysis was not available  has not had any episodic symptoms to suggest an MS flare and never received IV steroids.   MRI brain, C/T spine in 02/2017 - Multiple foci of cerebral white matter T2 hyperintensity, abnormal for age. This may reflect chronic small vessel ischemia with multiple chronic lacunar infarcts (deep MCA watershed). Demyelinating disease is possible, however there are no lesions specific for multiple sclerosis. No spinal cord lesions  LP was discussed in Dr. Tomi Likens note, but seems not proceeded yet.   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    She was previously on Dilantin and a medication that started with a "Z".    Her maternal grandmother had seizures.   EEG in 02/2017 normal   Not on AEDs at home  Stated no seizure for last 4-5 years except on slight episode 1-2 years ago but can not describe the episode  LP this afternoon  Hypertension . Stable now  Long term BP goal nomortensive  Hyperlipidemia  Home meds:  none   LDL 121, goal < 70  Now on lipitor 20  Continue statin at discharge  Tobacco abuse  Current smoker  Smoking cessation counseling provided  Pt is willing to quit  Other Stroke Risk Factors    Other Active  Problems  Leukocytopenia WBC 3.5->3.3->3.8  Anemia  Hb 8.2->9.5->9.7, unspecific  Hospital day # 2   Rosalin Hawking, MD PhD Stroke Neurology 04/06/2018 6:58 PM    To contact Stroke Continuity provider, please refer to [email or web address] After hours, contact General Neurology

## 2018-04-06 NOTE — Interval H&P Note (Signed)
History and Physical Interval Note:  04/06/2018 9:01 AM  Tina Moss  has presented today for surgery, with the diagnosis of STROKE  The various methods of treatment have been discussed with the patient and family. After consideration of risks, benefits and other options for treatment, the patient has consented to  Procedure(s): TRANSESOPHAGEAL ECHOCARDIOGRAM (TEE) (N/A) as a surgical intervention .  The patient's history has been reviewed, patient examined, no change in status, stable for surgery.  I have reviewed the patient's chart and labs.  Questions were answered to the patient's satisfaction.     Kirk Ruths

## 2018-04-06 NOTE — Progress Notes (Signed)
Patient ID: Tina Moss, female   DOB: 1971-01-06, 47 y.o.   MRN: [id number]  PROGRESS NOTE    Tina Moss  ELF:[id number] DOB: [birth year] DOA: 04/04/2018 PCP: Charlott Rakes, MD   Brief Narrative:  47 year old female with history of anemia, epilepsy, fibroids, mumps, hypertension, hyperlipidemia, seizures presented on 04/04/2018 with slurred speech.  Patient was admitted with acute stroke.  Neurology was consulted.   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  CTA head and neck unremarkable. -2D echo showed EF of 60 to 65%.  TEE done today was normal -Neurology following.   -LDL 121.  Continue statin and aspirin. -PT/OT is recommending CIR.  CIR consulted.  Diet as per SLP recommendations  Question of multiple sclerosis -Not on any treatment -Neurology is planning to do LP today.  Avoid Lovenox  Seizure disorder -Not on any antiepileptics at home currently.  Chronic anemia -Hemoglobin stable.  Outpatient follow-up  Essential hypertension -Monitor blood pressure.  Allow permissive hypertension.  Tobacco use -Tobacco cessation encouraged by admitting hospitalist.    DVT prophylaxis: Lovenox on hold for probable LP today  code Status: Full Family Communication: None at bedside Disposition Plan: Depends on clinical outcome Consultants: Neurology  Procedures:  2D echo Study Conclusions  - Left ventricle: The cavity size was normal. Wall thickness was   increased in a pattern of mild LVH. Systolic function was normal.   The estimated ejection fraction was in the range of 60% to 65%.   Wall motion was normal; there were no regional wall motion  abnormalities. - Mitral valve: Valve area by continuity equation (using LVOT   flow): 1.87 cm^2.  TEE on 04/06/2018 Normal LV function; negative saline micro cavitation study  Bilateral lower extremity duplex negative for DVT Antimicrobials: None   Subjective: Patient seen and examined at bedside.  She still has slurred speech.  No overnight fever, nausea or vomiting.  No chest pains.  Objective: Vitals:   04/06/18 0958 04/06/18 1005 04/06/18 1015 04/06/18 1236  BP: (!) 154/100 (!) 138/93 (!) 151/96 (!) 145/95  Pulse: 92 87 88 89  Resp: 18 15 18 16   Temp: 97.9 F (36.6 C)   97.8 F (36.6 C)  TempSrc: Oral   Oral  SpO2: 96% 94% 97% 100%  Height:        Intake/Output Summary (Last 24 hours) at 04/06/2018 1348 Last data filed at 04/06/2018 1156 Gross per 24 hour  Intake 659.19 ml  Output -  Net 659.19 ml   There were no vitals filed for this visit.  Examination:  General exam: Appears calm and comfortable.  No distress.  Still has some slurring of speech Respiratory system: Bilateral decreased breath sounds at bases, no wheezing Cardiovascular system: Rate controlled, S1-S2 heard Gastrointestinal system: Abdomen is nondistended, soft and nontender. Normal bowel sounds heard. Extremities: No cyanosis,  edema   Data Reviewed: I have personally reviewed following labs and imaging studies  CBC: Recent Labs  Lab 04/04/18 1108 04/04/18 1128 04/06/18 0423  WBC 3.3*  --  3.8*  NEUTROABS 1.7  --  1.9  HGB 8.2* 9.5* 9.7*  HCT 28.1* 28.0* 33.4*  MCV 73.0*  --  72.0*  PLT 254  --  387   Basic Metabolic Panel: Recent Labs  Lab 04/04/18 1108 04/04/18 1128 04/06/18 0423  NA 141 144 139  K 3.9 4.1 3.6  CL 116* 112* 107  CO2 21*  --  26  GLUCOSE 87 84 108*  BUN 10 10 12   CREATININE 0.48 0.40* 0.62  CALCIUM 7.7*  --  9.4  MG  --   --  2.0   GFR: CrCl cannot be calculated (Unknown ideal weight.). Liver Function Tests: Recent Labs  Lab 04/04/18 1108  AST 26    ALT 19  ALKPHOS 76  BILITOT 0.7  PROT 6.2*  ALBUMIN 3.3*   No results for input(s): LIPASE, AMYLASE in the last 168 hours. No results for input(s): AMMONIA in the last 168 hours. Coagulation Profile: Recent Labs  Lab 04/04/18 1108  INR 1.09   Cardiac Enzymes: No results for input(s): CKTOTAL, CKMB, CKMBINDEX, TROPONINI in the last 168 hours. BNP (last 3 results) No results for input(s): PROBNP in the last 8760 hours. HbA1C: Recent Labs    04/05/18 0535  HGBA1C 5.9*   CBG: No results for input(s): GLUCAP in the last 168 hours. Lipid Profile: Recent Labs    04/05/18 0535  CHOL 206*  HDL 72  LDLCALC 121*  TRIG 63  CHOLHDL 2.9   Thyroid Function Tests: No results for input(s): TSH, T4TOTAL, FREET4, T3FREE, THYROIDAB in the last 72 hours. Anemia Panel: Recent Labs    04/04/18 1108  RETICCTPCT 1.5   Sepsis Labs: No results for input(s): PROCALCITON, LATICACIDVEN in the last 168 hours.  No results found for this or any previous visit (from the past 240 hour(s)).       Radiology Studies: Dg Chest 2 View  Result Date: 04/04/2018 CLINICAL DATA:  Acute stroke, slurred speech, right-sided weakness and numbness EXAM: CHEST - 2 VIEW COMPARISON:  Chest x-ray of 03/29/2017 FINDINGS: There is little change in chronic elevation of the left hemidiaphragm. No pneumonia or effusion is seen. Mediastinal and hilar contours are unremarkable and the heart is within normal limits in size. No bony abnormality is seen. IMPRESSION: No active cardiopulmonary disease. Electronically Signed   By: Ivar Drape M.D.   On: 04/04/2018 15:42   Mr Brain Wo Contrast  Result Date: 04/04/2018 CLINICAL DATA:  Dysarthria.  Left frontal infarct on CT. EXAM: MRI HEAD WITHOUT CONTRAST MRA HEAD WITHOUT CONTRAST TECHNIQUE: Multiplanar, multiecho pulse sequences of the brain and surrounding structures were obtained without intravenous contrast. Angiographic images of the head were obtained using MRA  technique without contrast. COMPARISON:  Head CT and CTA 04/04/2018 and MRI 03/10/2017 FINDINGS: MRI HEAD FINDINGS Brain: There is a small acute cortical infarct in the posterior left frontal lobe corresponding to the abnormality on CT. No intracranial hemorrhage, mass, midline shift, or extra-axial fluid collection is identified. Bilateral cerebral white matter T2 hyperintensities, most notable in the centrum semiovale, are similar to the prior MRI with the appearance of multiple chronic lacunar infarcts. The ventricles are normal in size. Vascular: Major intracranial vascular flow voids are preserved. Skull and upper cervical spine: Unremarkable bone marrow signal. Sinuses/Orbits: Unremarkable orbits. Minimal right frontal sinus mucosal thickening. Clear mastoid air cells. Other: None. MRA HEAD FINDINGS The visualized distal vertebral arteries are widely patent to the basilar and codominant. Patent bilateral PICA, right AICA, and bilateral SCA origins are visualized. The basilar artery is widely patent. Both PCAs are patent without evidence of significant proximal stenosis. There is a fetal origin  of the right PCA. The internal carotid arteries are widely patent from skull base to carotid termini. ACAs and MCAs are patent without evidence of proximal branch occlusion or significant stenosis. No aneurysm is identified. IMPRESSION: 1. Small acute posterior left frontal lobe infarct (MCA territory). 2. 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 3. Negative head MRA. Electronically Signed   By: Logan Bores M.D.   On: 04/04/2018 16:38   Mr Jodene Nam Head Wo Contrast  Result Date: 04/04/2018 CLINICAL DATA:  Dysarthria.  Left frontal infarct on CT. EXAM: MRI HEAD WITHOUT CONTRAST MRA HEAD WITHOUT CONTRAST TECHNIQUE: Multiplanar, multiecho pulse sequences of the brain and surrounding structures were obtained  without intravenous contrast. Angiographic images of the head were obtained using MRA technique without contrast. COMPARISON:  Head CT and CTA 04/04/2018 and MRI 03/10/2017 FINDINGS: MRI HEAD FINDINGS Brain: There is a small acute cortical infarct in the posterior left frontal lobe corresponding to the abnormality on CT. No intracranial hemorrhage, mass, midline shift, or extra-axial fluid collection is identified. Bilateral cerebral white matter T2 hyperintensities, most notable in the centrum semiovale, are similar to the prior MRI with the appearance of multiple chronic lacunar infarcts. The ventricles are normal in size. Vascular: Major intracranial vascular flow voids are preserved. Skull and upper cervical spine: Unremarkable bone marrow signal. Sinuses/Orbits: Unremarkable orbits. Minimal right frontal sinus mucosal thickening. Clear mastoid air cells. Other: None. MRA HEAD FINDINGS The visualized distal vertebral arteries are widely patent to the basilar and codominant. Patent bilateral PICA, right AICA, and bilateral SCA origins are visualized. The basilar artery is widely patent. Both PCAs are patent without evidence of significant proximal stenosis. There is a fetal origin of the right PCA. The internal carotid arteries are widely patent from skull base to carotid termini. ACAs and MCAs are patent without evidence of proximal branch occlusion or significant stenosis. No aneurysm is identified. IMPRESSION: 1. Small acute posterior left frontal lobe infarct (MCA territory). 2. 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 3. Negative head MRA. Electronically Signed   By: Logan Bores M.D.   On: 04/04/2018 16:38        Scheduled Meds: . aspirin EC  81 mg Oral Daily  . atorvastatin  20 mg Oral q1800   Continuous Infusions:   LOS: 2 days        Aline August, MD [facility] Pager [phone]  If 7PM-7AM, please contact night-coverage [email or web address] Password [email or web address] 04/06/2018, 1:48 PM

## 2018-04-06 NOTE — Consult Note (Signed)
Physical Medicine and Rehabilitation Consult Reason for Consult: Right side weakness and aphasia Referring Physician: [facility]   HPI: Tina Moss is a 47 y.o. right-handed female with history of epilepsy, hypertension, hyperlipidemia and seizure disorder as well as medical noncompliance.  Presented 04/04/2018 with right-sided weakness and aphasia.  Per chart review patient lives with boyfriend.  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  One level home.  Cranial CT scan showed small area of cytotoxic edema in the left MCA M4 segment without hemorrhage or mass-effect.  Underlying chronic bilateral cerebral white matter disease.  CT angiogram of head and neck negative for large vessel occlusion.  Patient did not receive TPA.  Urine drug screen negative.  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  Negative head MRA.  Echocardiogram with ejection fraction of 65% no wall motion abnormalities.  TEE is pending.  Presently maintained on aspirin for CVA prophylaxis.  Therapy evaluation completed with recommendations of physical medicine rehab consult.   Review of Systems  Constitutional: Negative for chills and fever.  HENT: Negative for hearing loss.   Eyes: Negative for blurred vision and double vision.  Respiratory: Negative for cough and shortness of breath.   Cardiovascular: Negative for chest pain and palpitations.  Gastrointestinal: Positive for constipation. Negative for nausea and vomiting.  Genitourinary: Negative for dysuria, flank pain and hematuria.  Musculoskeletal: Positive for myalgias.  Skin: Negative for rash.  Neurological: Positive for speech change, focal weakness and seizures.  All other systems reviewed and are negative.  Past Medical History:  Diagnosis Date  . Anemia   . Epilepsy ([place])   . Fibroids   . H/O bacterial infection   . H/O mumps   . Hyperlipidemia   . Hypertension   . Seizures ([place])    Past Surgical  History:  Procedure Laterality Date  . CESAREAN SECTION     x2. at term  . TUBAL LIGATION    . WISDOM TOOTH EXTRACTION     Family History  Problem Relation Age of Onset  . Hypertension Mother   . Hypertension Sister   . Arthritis Sister        knees  . Hypertension Brother   . Deafness Brother   . Speech disorder Brother        mute  . Mental illness Brother        "he can just fly off"  . Mental illness Daughter        ? bipolar  . Scoliosis Son    Social History:  reports that she has been smoking cigars. She uses smokeless tobacco. She reports that she drinks alcohol. She reports that she has current or past drug history. Drug: Marijuana. Allergies: No Known Allergies Medications Prior to Admission  Medication Sig Dispense Refill  . OVER THE COUNTER MEDICATION as needed. CR4 weight gain pill    . cyclobenzaprine (FLEXERIL) 10 MG tablet Take 1 tablet (10 mg total) by mouth at bedtime. (Patient not taking: Reported on 04/04/2018) 30 tablet 2  . ferrous sulfate 325 (65 FE) MG tablet Take 1 tablet (325 mg total) by mouth daily with breakfast. 60 tablet 3  . medroxyPROGESTERone (DEPO-PROVERA) 150 MG/ML injection Inject 1 mL (150 mg total) into the muscle every 3 (three) months. (Patient not taking: Reported on 04/04/2018) 1 mL 3  . metoprolol (LOPRESSOR) 100 MG tablet Take 1 tablet (100 mg total) by mouth 2 (two) times daily. (Patient not taking: Reported on  04/04/2018) 60 tablet 3  . venlafaxine XR (EFFEXOR XR) 75 MG 24 hr capsule Take 1 capsule (75 mg total) by mouth daily with breakfast. (Patient not taking: Reported on 04/04/2018) 30 capsule 3    Home: Home Living Family/patient expects to be discharged to:: Private residence Living Arrangements: Spouse/significant other Available Help at Discharge: Family, Other (Comment)(as needed) Type of Home: Apartment Home Access: Stairs to enter [employer] of Steps: flight Entrance Stairs-Rails: Right, Left Home  Layout: One level Bathroom Shower/Tub: Chiropodist: Standard Home Equipment: Grab bars - tub/shower  Functional History: Prior Function Level of Independence: Independent Comments: works as a Secretary/administrator, I, drives, runs errands. Functional Status:  Mobility: Bed Mobility Overal bed mobility: Needs Assistance Bed Mobility: Supine to Sit Supine to sit: Min guard General bed mobility comments: OOB with PT upon arrival Transfers Overall transfer level: Needs assistance Equipment used: Rolling walker (2 wheeled) Transfers: Sit to/from Stand Sit to Stand: Min assist, Mod assist General transfer comment: assist to rise and steady with cues for safety; poor ecentric control requiring increased assist and verbal cues for fully backing up to seated surface prior to sitting Ambulation/Gait Ambulation/Gait assistance: Min assist, +2 safety/equipment Gait Distance (Feet): 20 Feet Assistive device: Rolling walker (2 wheeled) Gait Pattern/deviations: Step-to pattern General Gait Details: Deliberate step to gait pattern, moderate use of the RW.  Halted at times. Gait velocity: slow Gait velocity interpretation: <1.31 ft/sec, indicative of household ambulator    ADL: ADL Overall ADL's : Needs assistance/impaired Eating/Feeding: Set up, Sitting Grooming: Minimal assistance, Sitting Upper Body Bathing: Min guard, Minimal assistance, Sitting Lower Body Bathing: Moderate assistance, Sit to/from stand Upper Body Dressing : Minimal assistance, Sitting Lower Body Dressing: Moderate assistance, Sit to/from stand Lower Body Dressing Details (indicate cue type and reason): minA standing balance Toilet Transfer: Minimal assistance, Ambulation, Grab bars, RW Toileting- Clothing Manipulation and Hygiene: Minimal assistance, Sit to/from stand Toileting - Clothing Manipulation Details (indicate cue type and reason): assist for management of gown/mesh briefs; pt performing peri-care  seated on toilet Functional mobility during ADLs: Minimal assistance, Rolling walker  Cognition: Cognition Overall Cognitive Status: Impaired/Different from baseline Arousal/Alertness: Awake/alert Orientation Level: Oriented X4 Attention: Sustained, Selective Sustained Attention: Appears intact Selective Attention: Appears intact Memory: Appears intact(able to recall 5 items slp was bringing her within 5 minutes) Awareness: Appears intact Problem Solving: Appears intact(need to use call bell due to unsteadiness on her feet) Safety/Judgment: Appears intact Cognition Arousal/Alertness: Awake/alert Behavior During Therapy: WFL for tasks assessed/performed Overall Cognitive Status: Impaired/Different from baseline Area of Impairment: Safety/judgement, Problem solving, Following commands(not tested formally overall) Following Commands: Follows one step commands with increased time Safety/Judgement: Decreased awareness of safety Problem Solving: Slow processing, Requires verbal cues, Requires tactile cues  Blood pressure (!) 136/96, pulse 85, temperature 98 F (36.7 C), temperature source Oral, resp. rate 18, height 5\' 7"  (1.702 m), last menstrual period 03/21/2018, SpO2 99 %. Physical Exam  Constitutional: She appears well-developed.  HENT:  Head: Normocephalic.  Cardiovascular: Normal rate.  Respiratory: Effort normal.  GI: Soft.  Neurological:  Patient is alert.  Made eye contact with examiner.  Patient is aphasic.  Able to communicate with extra time and with single words and short phrases. Comprehension seems reasonable. Right central 7. Speech sl dysarthric.  RUE 4-/5. RLE 3+ to 4/5. LUE and LLE 4+ to 5./5. Sensation diminished to pain and light touch RUE and RLE  Skin: Skin is warm and dry.  Psychiatric: She has a normal mood  and affect. Her behavior is normal.    Results for orders placed or performed during the hospital encounter of 04/04/18 (from the past 24 hour(s))    C-reactive protein     Status: None   Collection Time: 04/06/18  4:23 AM  Result Value Ref Range   CRP <0.8 <1.0 mg/dL  CBC with Differential/Platelet     Status: Abnormal   Collection Time: 04/06/18  4:23 AM  Result Value Ref Range   WBC 3.8 (L) 4.0 - 10.5 K/uL   RBC 4.64 3.87 - 5.11 MIL/uL   Hemoglobin 9.7 (L) 12.0 - 15.0 g/dL   HCT 33.4 (L) 36.0 - 46.0 %   MCV 72.0 (L) 80.0 - 100.0 fL   MCH 20.9 (L) 26.0 - 34.0 pg   MCHC 29.0 (L) 30.0 - 36.0 g/dL   RDW 22.9 (H) 11.5 - 15.5 %   Platelets 287 150 - 400 K/uL   nRBC 0.0 0.0 - 0.2 %   Neutrophils Relative % 50 %   Neutro Abs 1.9 1.7 - 7.7 K/uL   Lymphocytes Relative 35 %   Lymphs Abs 1.3 0.7 - 4.0 K/uL   Monocytes Relative 12 %   Monocytes Absolute 0.5 0.1 - 1.0 K/uL   Eosinophils Relative 2 %   Eosinophils Absolute 0.1 0.0 - 0.5 K/uL   Basophils Relative 1 %   Basophils Absolute 0.0 0.0 - 0.1 K/uL   Immature Granulocytes 0 %   Abs Immature Granulocytes 0.01 0.00 - 0.07 K/uL   Polychromasia PRESENT   Basic metabolic panel     Status: Abnormal   Collection Time: 04/06/18  4:23 AM  Result Value Ref Range   Sodium 139 135 - 145 mmol/L   Potassium 3.6 3.5 - 5.1 mmol/L   Chloride 107 98 - 111 mmol/L   CO2 26 22 - 32 mmol/L   Glucose, Bld 108 (H) 70 - 99 mg/dL   BUN 12 6 - 20 mg/dL   Creatinine, Ser 0.62 0.44 - 1.00 mg/dL   Calcium 9.4 8.9 - 10.3 mg/dL   GFR calc non Af Amer >60 >60 mL/min   GFR calc Af Amer >60 >60 mL/min   Anion gap 6 5 - 15  Magnesium     Status: None   Collection Time: 04/06/18  4:23 AM  Result Value Ref Range   Magnesium 2.0 1.7 - 2.4 mg/dL   Ct Angio Head W Or Wo Contrast  Result Date: 04/04/2018 CLINICAL DATA:  Code stroke. 47 year old female with slurred speech this morning. ASPECTS 9 with abnormal left MCA M4 segment on plain head CT. EXAM: CT ANGIOGRAPHY HEAD AND NECK TECHNIQUE: Multidetector CT imaging of the head and neck was performed using the standard protocol during bolus administration  of intravenous contrast. Multiplanar CT image reconstructions and MIPs were obtained to evaluate the vascular anatomy. Carotid stenosis measurements (when applicable) are obtained utilizing NASCET criteria, using the distal internal carotid diameter as the denominator. CONTRAST:  29mL ISOVUE-370 IOPAMIDOL (ISOVUE-370) INJECTION 76% COMPARISON:  Head CT 1140 hours today. Chest radiographs 03/29/2017. FINDINGS: CTA NECK Skeleton: No acute osseous abnormality identified. Upper chest: Partially visible left lung density on series 11, image 160 5 May be due to elevated left hemidiaphragm. Otherwise negative visible lung parenchyma. No superior mediastinal lymphadenopathy. Other neck: Negative, no neck mass or lymphadenopathy. Aortic arch: 3 vessel arch configuration with minimal arch atherosclerosis. Right carotid system: No brachiocephalic artery or right CCA origin plaque or stenosis. Mildly tortuous proximal right CCA. Negative right  carotid bifurcation and cervical right ICA. Left carotid system: Normal left CCA origin. Mildly tortuous proximal left CCA. Negative left carotid bifurcation. Negative cervical left ICA. Vertebral arteries: Tortuous proximal right subclavian artery with a mildly kinked appearance but no atherosclerosis. Normal right vertebral artery origin. Patent right vertebral artery to the skull base without stenosis. Mild proximal left subclavian artery atherosclerosis without stenosis. Normal left vertebral artery origin. Patent left vertebral artery to the skull base without stenosis. CTA HEAD Posterior circulation: Patent distal vertebral arteries, the right is somewhat non dominant B on the patent right PICA origin. Normal left PICA origin. No distal vertebral scratched at there is distal vertebral artery irregularity but no associated stenosis. Patent vertebrobasilar junction and basilar artery with similar mild irregularity but no basilar stenosis. Fetal type right PCA origin. SCA and left PCA  origins are within normal limits. Left Posterior communicating artery is diminutive or absent. The bilateral PCA branches are patent and symmetric with mild irregularity. Anterior circulation: Patent left ICA siphon without stenosis. Patent left ICA terminus. Left MCA and ACA origins are normal. Normal left A1 segment. Anterior communicating artery and bilateral ACA branches are within normal limits. Left MCA M1 segment is patent and bifurcates early. Left MCA bifurcation is patent without stenosis. The left MCA M2 branches are patent. No discrete left MCA branch occlusion is identified. Patent right ICA siphon with no plaque or stenosis. Normal right posterior communicating artery origin. Patent right ICA terminus. Normal right MCA and ACA origins. Right MCA M1 segment, right MCA bifurcation, and right MCA M2 branches are patent. The distal right MCA branches appear symmetric to those on the left Venous sinuses: Patent. Anatomic variants: Mildly dominant distal left vertebral artery. Fetal type right PCA origin. Delayed phase: No abnormal enhancement identified. Stable gray-white matter differentiation throughout the brain. Review of the MIP images confirms the above findings IMPRESSION: 1. Negative for large vessel occlusion and NO discrete Left MCA branch occlusion identified to correspond to the evidence of small anterior left MCA division cortical infarct. This result was communicated to Dr. Lorraine Lax at at 1218 hours by text page via the [facility] messaging system. 2. No arterial atherosclerosis or stenosis in the neck. Mildly irregular appearance of the intracranial arteries diffusely which is nonspecific. No intracranial plaque or hemodynamically significant stenosis identified. 3. No acute findings in the neck and presumed chronic elevation of the left hemidiaphragm responsible for partially visible left lung opacity on the most caudal image. Electronically Signed   By: Genevie Ann M.D.   On: 04/04/2018 12:26   Dg  Chest 2 View  Result Date: 04/04/2018 CLINICAL DATA:  Acute stroke, slurred speech, right-sided weakness and numbness EXAM: CHEST - 2 VIEW COMPARISON:  Chest x-ray of 03/29/2017 FINDINGS: There is little change in chronic elevation of the left hemidiaphragm. No pneumonia or effusion is seen. Mediastinal and hilar contours are unremarkable and the heart is within normal limits in size. No bony abnormality is seen. IMPRESSION: No active cardiopulmonary disease. Electronically Signed   By: Ivar Drape M.D.   On: 04/04/2018 15:42   Ct Angio Neck W Or Wo Contrast  Result Date: 04/04/2018 CLINICAL DATA:  Code stroke. 47 year old female with slurred speech this morning. ASPECTS 9 with abnormal left MCA M4 segment on plain head CT. EXAM: CT ANGIOGRAPHY HEAD AND NECK TECHNIQUE: Multidetector CT imaging of the head and neck was performed using the standard protocol during bolus administration of intravenous contrast. Multiplanar CT image reconstructions and MIPs were obtained to  evaluate the vascular anatomy. Carotid stenosis measurements (when applicable) are obtained utilizing NASCET criteria, using the distal internal carotid diameter as the denominator. CONTRAST:  63mL ISOVUE-370 IOPAMIDOL (ISOVUE-370) INJECTION 76% COMPARISON:  Head CT 1140 hours today. Chest radiographs 03/29/2017. FINDINGS: CTA NECK Skeleton: No acute osseous abnormality identified. Upper chest: Partially visible left lung density on series 11, image 160 5 May be due to elevated left hemidiaphragm. Otherwise negative visible lung parenchyma. No superior mediastinal lymphadenopathy. Other neck: Negative, no neck mass or lymphadenopathy. Aortic arch: 3 vessel arch configuration with minimal arch atherosclerosis. Right carotid system: No brachiocephalic artery or right CCA origin plaque or stenosis. Mildly tortuous proximal right CCA. Negative right carotid bifurcation and cervical right ICA. Left carotid system: Normal left CCA origin. Mildly  tortuous proximal left CCA. Negative left carotid bifurcation. Negative cervical left ICA. Vertebral arteries: Tortuous proximal right subclavian artery with a mildly kinked appearance but no atherosclerosis. Normal right vertebral artery origin. Patent right vertebral artery to the skull base without stenosis. Mild proximal left subclavian artery atherosclerosis without stenosis. Normal left vertebral artery origin. Patent left vertebral artery to the skull base without stenosis. CTA HEAD Posterior circulation: Patent distal vertebral arteries, the right is somewhat non dominant B on the patent right PICA origin. Normal left PICA origin. No distal vertebral scratched at there is distal vertebral artery irregularity but no associated stenosis. Patent vertebrobasilar junction and basilar artery with similar mild irregularity but no basilar stenosis. Fetal type right PCA origin. SCA and left PCA origins are within normal limits. Left Posterior communicating artery is diminutive or absent. The bilateral PCA branches are patent and symmetric with mild irregularity. Anterior circulation: Patent left ICA siphon without stenosis. Patent left ICA terminus. Left MCA and ACA origins are normal. Normal left A1 segment. Anterior communicating artery and bilateral ACA branches are within normal limits. Left MCA M1 segment is patent and bifurcates early. Left MCA bifurcation is patent without stenosis. The left MCA M2 branches are patent. No discrete left MCA branch occlusion is identified. Patent right ICA siphon with no plaque or stenosis. Normal right posterior communicating artery origin. Patent right ICA terminus. Normal right MCA and ACA origins. Right MCA M1 segment, right MCA bifurcation, and right MCA M2 branches are patent. The distal right MCA branches appear symmetric to those on the left Venous sinuses: Patent. Anatomic variants: Mildly dominant distal left vertebral artery. Fetal type right PCA origin. Delayed  phase: No abnormal enhancement identified. Stable gray-white matter differentiation throughout the brain. Review of the MIP images confirms the above findings IMPRESSION: 1. Negative for large vessel occlusion and NO discrete Left MCA branch occlusion identified to correspond to the evidence of small anterior left MCA division cortical infarct. This result was communicated to Dr. Lorraine Lax at at 1218 hours by text page via the [facility] messaging system. 2. No arterial atherosclerosis or stenosis in the neck. Mildly irregular appearance of the intracranial arteries diffusely which is nonspecific. No intracranial plaque or hemodynamically significant stenosis identified. 3. No acute findings in the neck and presumed chronic elevation of the left hemidiaphragm responsible for partially visible left lung opacity on the most caudal image. Electronically Signed   By: Genevie Ann M.D.   On: 04/04/2018 12:26   Mr Brain Wo Contrast  Result Date: 04/04/2018 CLINICAL DATA:  Dysarthria.  Left frontal infarct on CT. EXAM: MRI HEAD WITHOUT CONTRAST MRA HEAD WITHOUT CONTRAST TECHNIQUE: Multiplanar, multiecho pulse sequences of the brain and surrounding structures were obtained without intravenous contrast.  Angiographic images of the head were obtained using MRA technique without contrast. COMPARISON:  Head CT and CTA 04/04/2018 and MRI 03/10/2017 FINDINGS: MRI HEAD FINDINGS Brain: There is a small acute cortical infarct in the posterior left frontal lobe corresponding to the abnormality on CT. No intracranial hemorrhage, mass, midline shift, or extra-axial fluid collection is identified. Bilateral cerebral white matter T2 hyperintensities, most notable in the centrum semiovale, are similar to the prior MRI with the appearance of multiple chronic lacunar infarcts. The ventricles are normal in size. Vascular: Major intracranial vascular flow voids are preserved. Skull and upper cervical spine: Unremarkable bone marrow signal.  Sinuses/Orbits: Unremarkable orbits. Minimal right frontal sinus mucosal thickening. Clear mastoid air cells. Other: None. MRA HEAD FINDINGS The visualized distal vertebral arteries are widely patent to the basilar and codominant. Patent bilateral PICA, right AICA, and bilateral SCA origins are visualized. The basilar artery is widely patent. Both PCAs are patent without evidence of significant proximal stenosis. There is a fetal origin of the right PCA. The internal carotid arteries are widely patent from skull base to carotid termini. ACAs and MCAs are patent without evidence of proximal branch occlusion or significant stenosis. No aneurysm is identified. IMPRESSION: 1. Small acute posterior left frontal lobe infarct (MCA territory). 2. 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 3. Negative head MRA. Electronically Signed   By: Logan Bores M.D.   On: 04/04/2018 16:38   Mr Jodene Nam Head Wo Contrast  Result Date: 04/04/2018 CLINICAL DATA:  Dysarthria.  Left frontal infarct on CT. EXAM: MRI HEAD WITHOUT CONTRAST MRA HEAD WITHOUT CONTRAST TECHNIQUE: Multiplanar, multiecho pulse sequences of the brain and surrounding structures were obtained without intravenous contrast. Angiographic images of the head were obtained using MRA technique without contrast. COMPARISON:  Head CT and CTA 04/04/2018 and MRI 03/10/2017 FINDINGS: MRI HEAD FINDINGS Brain: There is a small acute cortical infarct in the posterior left frontal lobe corresponding to the abnormality on CT. No intracranial hemorrhage, mass, midline shift, or extra-axial fluid collection is identified. Bilateral cerebral white matter T2 hyperintensities, most notable in the centrum semiovale, are similar to the prior MRI with the appearance of multiple chronic lacunar infarcts. The ventricles are normal in size. Vascular: Major intracranial vascular flow voids  are preserved. Skull and upper cervical spine: Unremarkable bone marrow signal. Sinuses/Orbits: Unremarkable orbits. Minimal right frontal sinus mucosal thickening. Clear mastoid air cells. Other: None. MRA HEAD FINDINGS The visualized distal vertebral arteries are widely patent to the basilar and codominant. Patent bilateral PICA, right AICA, and bilateral SCA origins are visualized. The basilar artery is widely patent. Both PCAs are patent without evidence of significant proximal stenosis. There is a fetal origin of the right PCA. The internal carotid arteries are widely patent from skull base to carotid termini. ACAs and MCAs are patent without evidence of proximal branch occlusion or significant stenosis. No aneurysm is identified. IMPRESSION: 1. Small acute posterior left frontal lobe infarct (MCA territory). 2. 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 3. Negative head MRA. Electronically Signed   By: Logan Bores M.D.   On: 04/04/2018 16:38   Ct Head Code Stroke Wo Contrast  Result Date: 04/04/2018 CLINICAL DATA:  Code stroke. 47 year old female with slurred speech this morning. EXAM: CT HEAD WITHOUT CONTRAST TECHNIQUE: Contiguous axial images were obtained from the base of the  skull through the vertex without intravenous contrast. COMPARISON:  Brain MRI 03/10/2017.  Head CT 09/15/2016. FINDINGS: Brain: Chronic cerebral white matter hypodense foci as demonstrated on the prior studies appear stable since 2018. Small area of new cortical hypodensity in the left middle frontal gyrus (series 3, image 22). No associated hemorrhage or mass effect. No other changes of acute cortical infarct. Stable cerebral volume. No ventriculomegaly. Basilar cisterns remain normal. Vascular: Mild Calcified atherosclerosis at the skull base. No suspicious intracranial vascular hyperdensity. Skull: Stable and negative.  Sinuses/Orbits: Visualized paranasal sinuses and mastoids are stable and well pneumatized. Other: Visualized orbits and scalp soft tissues are within normal limits. ASPECTS [facility] Stroke Program Early CT Score) - Ganglionic level infarction (caudate, lentiform nuclei, internal capsule, insula, M1-M3 cortex): 7 - Supraganglionic infarction (M4-M6 cortex): 2 (abnormal M4 segment). Total score (0-10 with 10 being normal): 9 IMPRESSION: 1. Small area of cytotoxic edema in the left MCA M4 segment without hemorrhage or mass effect. ASPECTS is 9. 2. Underlying chronic bilateral cerebral white matter disease. 3. CTA head and neck reported separately. 4. These results were communicated to Dr. Lorraine Lax at 12:11 pmon 10/22/2019by text page via the [facility] messaging system. Electronically Signed   By: Genevie Ann M.D.   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 Does the need for close, 24 hr/day medical supervision in concert with the patient's rehab needs make it unreasonable for this patient to be served in a less intensive setting? Yes 2. Co-Morbidities requiring supervision/potential complications: htn, epilepsy, post-stroke sequelae 3. Due to bladder management, bowel management, safety, skin/wound care, disease management, medication administration and patient education, does the patient require 24 hr/day rehab nursing? Yes 4. Does the patient require coordinated care of a physician, rehab nurse, PT (1-2 hrs/day, 5 days/week), OT (1-2 hrs/day, 5 days/week) and SLP (1-2 hrs/day, 5 days/week) to address physical and functional deficits in the context of the above medical diagnosis(es)? Yes Addressing deficits in the following areas: balance, endurance, locomotion, strength, transferring, bowel/bladder control, bathing, dressing, feeding, grooming, toileting, cognition, speech, language and psychosocial support 5. Can the patient actively  participate in an intensive therapy program of at least 3 hrs of therapy per day at least 5 days per week? Yes 6. The potential for patient to make measurable gains while on inpatient rehab is excellent 7. Anticipated functional outcomes upon discharge from inpatient rehab are modified independent  with PT, modified independent with OT, modified independent and supervision with SLP. 8. Estimated rehab length of stay to reach the above functional goals is: 13-17 days 9. Anticipated D/C setting: Home 10. Anticipated post D/C treatments: HH therapy and Outpatient therapy 11. Overall Rehab/Functional Prognosis: excellent  RECOMMENDATIONS: This patient's condition is appropriate for continued rehabilitative care in the following setting: CIR Patient has agreed to participate in recommended program. Yes Note that insurance prior authorization may be required for reimbursement for recommended care.  Comment: Rehab Admissions Coordinator to follow up.  Thanks,  [name], MD, Mellody Drown  I have personally performed a face to face diagnostic evaluation of this patient. Additionally, I have reviewed and concur with the physician assistant's documentation above.    Lavon Paganini Angiulli, PA-C 04/06/2018

## 2018-04-07 ENCOUNTER — Other Ambulatory Visit: Payer: Self-pay | Admitting: Neurology

## 2018-04-07 ENCOUNTER — Other Ambulatory Visit: Payer: Self-pay

## 2018-04-07 ENCOUNTER — Inpatient Hospital Stay (HOSPITAL_COMMUNITY)
Admission: RE | Admit: 2018-04-07 | Discharge: 2018-04-18 | DRG: 056 | Disposition: A | Discharge status: Home Health | Payer: Self-pay | Source: Intra-hospital | Attending: Physical Medicine & Rehabilitation | Admitting: Physical Medicine & Rehabilitation

## 2018-04-07 DIAGNOSIS — Z716 Tobacco abuse counseling: Secondary | ICD-10-CM

## 2018-04-07 DIAGNOSIS — I10 Essential (primary) hypertension: Secondary | ICD-10-CM

## 2018-04-07 DIAGNOSIS — Z599 Problem related to housing and economic circumstances, unspecified: Secondary | ICD-10-CM

## 2018-04-07 DIAGNOSIS — E785 Hyperlipidemia, unspecified: Secondary | ICD-10-CM | POA: Diagnosis present

## 2018-04-07 DIAGNOSIS — I63512 Cerebral infarction due to unspecified occlusion or stenosis of left middle cerebral artery: Secondary | ICD-10-CM

## 2018-04-07 DIAGNOSIS — D509 Iron deficiency anemia, unspecified: Secondary | ICD-10-CM | POA: Diagnosis present

## 2018-04-07 DIAGNOSIS — Z793 Long term (current) use of hormonal contraceptives: Secondary | ICD-10-CM

## 2018-04-07 DIAGNOSIS — G936 Cerebral edema: Secondary | ICD-10-CM | POA: Diagnosis present

## 2018-04-07 DIAGNOSIS — Z9119 Patient's noncompliance with other medical treatment and regimen: Secondary | ICD-10-CM

## 2018-04-07 DIAGNOSIS — R4701 Aphasia: Secondary | ICD-10-CM

## 2018-04-07 DIAGNOSIS — G40909 Epilepsy, unspecified, not intractable, without status epilepticus: Secondary | ICD-10-CM | POA: Diagnosis present

## 2018-04-07 DIAGNOSIS — R1013 Epigastric pain: Secondary | ICD-10-CM | POA: Diagnosis present

## 2018-04-07 DIAGNOSIS — D72819 Decreased white blood cell count, unspecified: Secondary | ICD-10-CM | POA: Diagnosis present

## 2018-04-07 DIAGNOSIS — Z8619 Personal history of other infectious and parasitic diseases: Secondary | ICD-10-CM

## 2018-04-07 DIAGNOSIS — E876 Hypokalemia: Secondary | ICD-10-CM | POA: Diagnosis present

## 2018-04-07 DIAGNOSIS — F17290 Nicotine dependence, other tobacco product, uncomplicated: Secondary | ICD-10-CM | POA: Diagnosis present

## 2018-04-07 DIAGNOSIS — I639 Cerebral infarction, unspecified: Secondary | ICD-10-CM

## 2018-04-07 DIAGNOSIS — Z79899 Other long term (current) drug therapy: Secondary | ICD-10-CM

## 2018-04-07 DIAGNOSIS — I69322 Dysarthria following cerebral infarction: Secondary | ICD-10-CM

## 2018-04-07 DIAGNOSIS — I69351 Hemiplegia and hemiparesis following cerebral infarction affecting right dominant side: Principal | ICD-10-CM

## 2018-04-07 DIAGNOSIS — I69320 Aphasia following cerebral infarction: Secondary | ICD-10-CM

## 2018-04-07 DIAGNOSIS — Z8249 Family history of ischemic heart disease and other diseases of the circulatory system: Secondary | ICD-10-CM

## 2018-04-07 DIAGNOSIS — F1290 Cannabis use, unspecified, uncomplicated: Secondary | ICD-10-CM | POA: Diagnosis present

## 2018-04-07 DIAGNOSIS — Z8673 Personal history of transient ischemic attack (TIA), and cerebral infarction without residual deficits: Secondary | ICD-10-CM | POA: Diagnosis present

## 2018-04-07 LAB — BETA-2-GLYCOPROTEIN I ABS, IGG/M/A
Beta-2 Glyco I IgG: <9 GPI IgG units (ref 0–20)
Beta-2-Glycoprotein I IgA: <9 GPI IgA units (ref 0–25)
Beta-2-Glycoprotein I IgM: <9 GPI IgM units (ref 0–32)

## 2018-04-07 LAB — HOMOCYSTEINE: Homocysteine: 10.3 umol/L (ref 0.0–15.0)

## 2018-04-07 LAB — ANTI-DNA ANTIBODY, DOUBLE-STRANDED: ds DNA Ab: <1 IU/mL (ref 0–9)

## 2018-04-07 LAB — CARDIOLIPIN ANTIBODIES, IGG, IGM, IGA
Anticardiolipin IgG: <9 GPL U/mL (ref 0–14)
Anticardiolipin IgM: 13 MPL U/mL — ABNORMAL HIGH (ref 0–12)

## 2018-04-07 LAB — LUPUS ANTICOAGULANT PANEL
DRVVT: 32.1 s (ref 0.0–47.0)
PTT Lupus Anticoagulant: 38.2 s (ref 0.0–51.9)

## 2018-04-07 LAB — ANTINUCLEAR ANTIBODIES, IFA: ANA Ab, IFA: NEGATIVE

## 2018-04-07 MED ORDER — CLOPIDOGREL BISULFATE 75 MG PO TABS
75.0000 mg | ORAL_TABLET | Freq: Every day | ORAL | Status: DC
  Administered 2018-04-07: 75 mg via ORAL
  Filled 2018-04-07: qty 1

## 2018-04-07 MED ORDER — ONDANSETRON HCL 4 MG/2ML IJ SOLN: 4.0000 mg | Freq: Four times a day (QID) | INTRAMUSCULAR | Status: DC | PRN

## 2018-04-07 MED ORDER — ASPIRIN 81 MG PO TBEC: 81.0000 mg | DELAYED_RELEASE_TABLET | Freq: Every day | ORAL | 0 refills | Status: DC

## 2018-04-07 MED ORDER — ACETAMINOPHEN 160 MG/5ML PO SOLN: 650.0000 mg | ORAL | Status: DC | PRN

## 2018-04-07 MED ORDER — ACETAMINOPHEN 650 MG RE SUPP: 650.0000 mg | RECTAL | Status: DC | PRN

## 2018-04-07 MED ORDER — ONDANSETRON HCL 4 MG PO TABS: 4.0000 mg | ORAL_TABLET | Freq: Four times a day (QID) | ORAL | Status: DC | PRN

## 2018-04-07 MED ORDER — ATORVASTATIN CALCIUM 20 MG PO TABS: 20.0000 mg | ORAL_TABLET | Freq: Every day | ORAL | 0 refills | Status: DC

## 2018-04-07 MED ORDER — CLOPIDOGREL BISULFATE 75 MG PO TABS: 75.0000 mg | ORAL_TABLET | Freq: Every day | ORAL | 0 refills | Status: DC

## 2018-04-07 MED ORDER — ASPIRIN EC 81 MG PO TBEC
81.0000 mg | DELAYED_RELEASE_TABLET | Freq: Every day | ORAL | Status: DC
  Administered 2018-04-08 – 2018-04-18 (×11): 81 mg via ORAL
  Filled 2018-04-07 (×11): qty 1

## 2018-04-07 MED ORDER — CLOPIDOGREL BISULFATE 75 MG PO TABS
75.0000 mg | ORAL_TABLET | Freq: Every day | ORAL | Status: DC
  Administered 2018-04-08 – 2018-04-18 (×11): 75 mg via ORAL
  Filled 2018-04-07 (×11): qty 1

## 2018-04-07 MED ORDER — ATORVASTATIN CALCIUM 20 MG PO TABS
20.0000 mg | ORAL_TABLET | Freq: Every day | ORAL | Status: DC
  Administered 2018-04-07 – 2018-04-17 (×11): 20 mg via ORAL
  Filled 2018-04-07 (×12): qty 1

## 2018-04-07 MED ORDER — ACETAMINOPHEN 325 MG PO TABS
650.0000 mg | ORAL_TABLET | ORAL | Status: DC | PRN
  Administered 2018-04-08 – 2018-04-13 (×4): 650 mg via ORAL
  Filled 2018-04-07 (×7): qty 2

## 2018-04-07 MED ORDER — SORBITOL 70 % SOLN: 30.0000 mL | Freq: Every day | Status: DC | PRN

## 2018-04-07 MED ORDER — BLOOD PRESSURE CONTROL BOOK
Freq: Once | Status: AC
  Administered 2018-04-07: 19:00:00
  Filled 2018-04-07: qty 1

## 2018-04-07 NOTE — Progress Notes (Signed)
Inpatient Rehabilitation-Admissions Coordinator   Returned after pt had a chance to work with therapies today. Pt has now decided to pursue CIR and has given consent. AC has received medical approval for admit to CIR today. AC has contacted CM and SW regarding plans.   Please call if questions.   Jhonnie Garner, OTR/L  Rehab Admissions Coordinator  [phone] 04/07/2018 12:32 PM

## 2018-04-07 NOTE — Progress Notes (Signed)
Occupational Therapy Treatment Patient Details Name: Tina Moss MRN: [id number] DOB: 10-31-1970 Today's Date: 04/07/2018    History of present illness pt is a 47 y/o female with pmh significant for epilepsy, HTN, HLD, tobacco use, admitted to ED due to having slurred speech and right sided hemiparesis.  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   OT comments  Pt is making progress towards goals.  Pt currently requires min-mod assist for self-care tasks, including dynamic standing balance for bathing/dressing/toileting.  Pt initially expressing desire to d/c home with assist from boyfriend, however throughout session pt with increased awareness of deficits and current assist level and agreeable to CIR to decrease burden of care and increase independence as pt expressing desire to be more independent.   Follow Up Recommendations  CIR;Supervision/Assistance - 24 hour    Equipment Recommendations  3 in 1 bedside commode;Tub/shower bench       Precautions / Restrictions Precautions Precautions: Fall Restrictions Weight Bearing Restrictions: No       Mobility Bed Mobility Overal bed mobility: Needs Assistance Bed Mobility: Supine to Sit     Supine to sit: Min guard;HOB elevated     General bed mobility comments: Min guard and increased time to come to EOB.  Transfers Overall transfer level: Needs assistance Equipment used: Rolling walker (2 wheeled) Transfers: Sit to/from Stand Sit to Stand: Min assist Stand pivot transfers: Min assist       General transfer comment: min assist to boost in to standing and cues for anterior weight shift during sit > stand    Balance Overall balance assessment: Needs assistance Sitting-balance support: No upper extremity supported;Feet supported Sitting balance-Leahy Scale: Good       Standing balance-Leahy Scale: Poor Standing balance comment: reliance on the RW and external support.                            ADL either performed or assessed with clinical judgement   ADL Overall ADL's : Needs assistance/impaired Eating/Feeding: Set up;Sitting       Upper Body Bathing: Min guard;Minimal assistance;Sitting   Lower Body Bathing: Moderate assistance;Sit to/from stand   Upper Body Dressing : Minimal assistance;Sitting   Lower Body Dressing: Moderate assistance;Sit to/from stand   Toilet Transfer: Minimal assistance;Ambulation;Grab bars;RW;Moderate assistance Toilet Transfer Details (indicate cue type and reason): mod assist from low toilet without grab bars to simulate home environment Toileting- Clothing Manipulation and Hygiene: Minimal assistance;Sit to/from stand Toileting - Clothing Manipulation Details (indicate cue type and reason): assist for management of gown/mesh briefs; pt performing peri-care seated on toilet     Functional mobility during ADLs: Minimal assistance;Rolling walker                 Cognition Arousal/Alertness: Awake/alert Behavior During Therapy: WFL for tasks assessed/performed Overall Cognitive Status: Impaired/Different from baseline Area of Impairment: Safety/judgement;Problem solving                       Following Commands: Follows one step commands with increased time Safety/Judgement: Decreased awareness of safety   Problem Solving: Slow processing;Requires verbal cues;Requires tactile cues General Comments: Pt is showing some awareness of deficits                   Pertinent Vitals/ Pain       Pain Assessment: Faces Faces Pain Scale: Hurts little more Pain Location: back Pain Descriptors /  Indicators: Discomfort;Sore Pain Intervention(s): Monitored during session;Repositioned     Prior Functioning/Environment              Frequency  Min 2X/week        Progress Toward Goals  OT Goals(current goals can now be found in the care plan section)  Progress towards OT goals: Progressing  toward goals  Acute Rehab OT Goals Patient Stated Goal: back to work OT Goal Formulation: With patient  Plan Discharge plan remains appropriate       AM-PAC PT "6 Clicks" Daily Activity     Outcome Measure   Help from another person eating meals?: None Help from another person taking care of personal grooming?: A Little Help from another person toileting, which includes using toliet, bedpan, or urinal?: A Lot Help from another person bathing (including washing, rinsing, drying)?: A Lot Help from another person to put on and taking off regular upper body clothing?: A Little Help from another person to put on and taking off regular lower body clothing?: A Lot 6 Click Score: 16    End of Session Equipment Utilized During Treatment: Gait belt;Rolling walker  OT Visit Diagnosis: Unsteadiness on feet (R26.81);Muscle weakness (generalized) (M62.81);Other symptoms and signs involving the nervous system (R29.898)   Activity Tolerance Patient tolerated treatment well   Patient Left with family/visitor present   Nurse Communication Mobility status(pt left EOB with boyfriend present)        Time: 5681-2751 OT Time Calculation (min): 24 min  Charges: OT General Charges $OT Visit: 1 Visit OT Treatments $Self Care/Home Management : 23-37 mins   Simonne Come, [phone] 04/07/2018, 12:25 PM

## 2018-04-07 NOTE — Care Management Note (Signed)
Case Management Note  Patient Details  Name: Tina Moss MRN: [id number] Date of Birth: 1971-03-01  Subjective/Objective:                    Action/Plan: Pt is discharging to CIR today. CM signing off.   Expected Discharge Date:  04/07/18               Expected Discharge Plan:  [place]  In-House Referral:     Discharge planning Services  CM Consult  Post Acute Care Choice:    Choice offered to:     DME Arranged:    DME Agency:     HH Arranged:    HH Agency:     Status of Service:  Completed, signed off  If discussed at [employer], dates discussed:    Additional Comments:  Pollie Friar, RN 04/07/2018, 12:55 PM

## 2018-04-07 NOTE — H&P (Signed)
Physical Medicine and Rehabilitation Admission H&P    Chief Complaint  Patient presents with  . Weakness  . Stroke Symptoms  : HPI: Tina Moss is a 47 year old right-handed female with history of epilepsy, hypertension, hyperlipidemia and seizure disorder in the past and seizure free for 4-5 years with medical noncompliance due to financial issues.  She has seen Dr. Tomi Likens in the past concerning for MS but no LP was done.  Presented 04/04/2018 with right-sided weakness and aphasia.  Per chart review patient lives with boyfriend.  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  One level home.  Cranial CT scan showed small area of cytotoxic edema in the left MCA M4 segment without hemorrhage or mass-effect.  Underlying chronic bilateral cerebral white matter disease.  CT angiogram of head and neck negative for large vessel occlusion.  Patient did not receive TPA.  Urine drug screen negative.  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  Negative head MRA.  Venous Doppler studies lower extremity negative.  Echocardiogram with ejection fraction 65% no wall motion abnormalities.  TEE normal LV function, negative saline micro cavitation study/no PFO.  An LP was completed 04/06/2018 to assess for CNS pathology as she had seen neurology in the past for questionable MS but no work-up was initiated at that time and results remain pending.  Currently on aspirin/Plavix for CVA prophylaxis.  Therapy evaluations completed with recommendations of physical medicine rehab consult.  Patient was admitted for a comprehensive rehab program.  Review of Systems  Constitutional: Negative for chills and fever.  HENT: Negative for hearing loss.   Eyes: Negative for blurred vision and double vision.  Respiratory: Negative for cough and shortness of breath.   Cardiovascular: Negative for chest pain, palpitations and leg swelling.  Gastrointestinal: Positive for constipation.  Negative for nausea and vomiting.  Genitourinary: Negative for dysuria, flank pain and hematuria.  Musculoskeletal: Positive for myalgias.  Skin: Negative for rash.  Neurological: Positive for speech change, focal weakness and seizures.  All other systems reviewed and are negative.  Past Medical History:  Diagnosis Date  . Anemia   . Epilepsy ([place])   . Fibroids   . H/O bacterial infection   . H/O mumps   . Hyperlipidemia   . Hypertension   . Seizures ([place])    Past Surgical History:  Procedure Laterality Date  . CESAREAN SECTION     x2. at term  . TEE WITHOUT CARDIOVERSION N/A 04/06/2018   Procedure: TRANSESOPHAGEAL ECHOCARDIOGRAM (TEE) BUBBLE STUDY;  Surgeon: Lelon Perla, MD;  Location: [facility] ENDOSCOPY;  Service: Cardiovascular;  Laterality: N/A;  . TUBAL LIGATION    . WISDOM TOOTH EXTRACTION     Family History  Problem Relation Age of Onset  . Hypertension Mother   . Hypertension Sister   . Arthritis Sister        knees  . Hypertension Brother   . Deafness Brother   . Speech disorder Brother        mute  . Mental illness Brother        "he can just fly off"  . Mental illness Daughter        ? bipolar  . Scoliosis Son    Social History:  reports that she has been smoking cigars. She uses smokeless tobacco. She reports that she drinks alcohol. She reports that she has current or past drug history. Drug: Marijuana. Allergies: No Known Allergies Medications Prior to Admission  Medication Sig  Dispense Refill  . OVER THE COUNTER MEDICATION as needed. CR4 weight gain pill    . cyclobenzaprine (FLEXERIL) 10 MG tablet Take 1 tablet (10 mg total) by mouth at bedtime. (Patient not taking: Reported on 04/04/2018) 30 tablet 2  . ferrous sulfate 325 (65 FE) MG tablet Take 1 tablet (325 mg total) by mouth daily with breakfast. 60 tablet 3  . medroxyPROGESTERone (DEPO-PROVERA) 150 MG/ML injection Inject 1 mL (150 mg total) into the muscle every 3 (three) months. (Patient not  taking: Reported on 04/04/2018) 1 mL 3  . metoprolol (LOPRESSOR) 100 MG tablet Take 1 tablet (100 mg total) by mouth 2 (two) times daily. (Patient not taking: Reported on 04/04/2018) 60 tablet 3  . venlafaxine XR (EFFEXOR XR) 75 MG 24 hr capsule Take 1 capsule (75 mg total) by mouth daily with breakfast. (Patient not taking: Reported on 04/04/2018) 30 capsule 3    Drug Regimen Review Drug regimen was reviewed and remains appropriate with no significant issues identified  Home: Home Living Family/patient expects to be discharged to:: Private residence Living Arrangements: Spouse/significant other Available Help at Discharge: Family, Other (Comment)(as needed) Type of Home: Apartment Home Access: Stairs to enter [employer] of Steps: flight Entrance Stairs-Rails: Right, Left Home Layout: One level Bathroom Shower/Tub: Chiropodist: Standard Home Equipment: Grab bars - tub/shower   Functional History: Prior Function Level of Independence: Independent Comments: works as a Secretary/administrator, I, drives, runs errands.  Functional Status:  Mobility: Bed Mobility Overal bed mobility: Needs Assistance Bed Mobility: Supine to Sit, Sit to Supine Supine to sit: Min guard Sit to supine: Min assist General bed mobility comments: Min guard and increased time to come to EOB. Min A for LE assist to return to supine.  Transfers Overall transfer level: Needs assistance Equipment used: Rolling walker (2 wheeled) Transfers: Sit to/from Stand, [employer] Transfers Sit to Stand: Min assist, Mod assist Stand pivot transfers: Min assist General transfer comment: Min to mod A for lift assist and steadying. Performed X3 during session. With increased fatigue, pt requiring increased support. Verbal cues for safe hand placement during all transfers, as pt wanting to pull up on RW.  Ambulation/Gait Ambulation/Gait assistance: Min assist, +2 safety/equipment(chair follow ) Gait  Distance (Feet): 25 Feet(X2) Assistive device: Rolling walker (2 wheeled) Gait Pattern/deviations: Step-to pattern, Narrow base of support, Decreased dorsiflexion - right General Gait Details: Pt with heavy use of UEs. Narrow BOS noted, especially during turns and required cues for appropriate BOS. Cues for heel strike as well. Required seated rest in between gait trials.  Gait velocity: slow Gait velocity interpretation: <1.31 ft/sec, indicative of household ambulator    ADL: ADL Overall ADL's : Needs assistance/impaired Eating/Feeding: Set up, Sitting Grooming: Minimal assistance, Sitting Upper Body Bathing: Min guard, Minimal assistance, Sitting Lower Body Bathing: Moderate assistance, Sit to/from stand Upper Body Dressing : Minimal assistance, Sitting Lower Body Dressing: Moderate assistance, Sit to/from stand Lower Body Dressing Details (indicate cue type and reason): minA standing balance Toilet Transfer: Minimal assistance, Ambulation, Grab bars, RW Toileting- Clothing Manipulation and Hygiene: Minimal assistance, Sit to/from stand Toileting - Clothing Manipulation Details (indicate cue type and reason): assist for management of gown/mesh briefs; pt performing peri-care seated on toilet Functional mobility during ADLs: Minimal assistance, Rolling walker  Cognition: Cognition Overall Cognitive Status: Impaired/Different from baseline Arousal/Alertness: Awake/alert Orientation Level: Oriented X4 Attention: Sustained, Selective Sustained Attention: Appears intact Selective Attention: Appears intact Memory: Appears intact(able to recall 5 items slp was bringing  her within 5 minutes) Awareness: Appears intact Problem Solving: Appears intact(need to use call bell due to unsteadiness on her feet) Safety/Judgment: Appears intact Cognition Arousal/Alertness: Awake/alert Behavior During Therapy: WFL for tasks assessed/performed Overall Cognitive Status: Impaired/Different from  baseline Area of Impairment: Safety/judgement, Problem solving, Following commands Following Commands: Follows one step commands with increased time Safety/Judgement: Decreased awareness of safety Problem Solving: Slow processing, Requires verbal cues, Requires tactile cues  Physical Exam: Blood pressure 139/76, pulse 98, temperature 98 F (36.7 C), temperature source Oral, resp. rate 18, height _0  (1.702 m), last menstrual period 03/21/2018, SpO2 98 %. Physical Exam  Constitutional: She is oriented to person, place, and time. She appears well-developed and well-nourished.  HENT:  Head: Normocephalic and atraumatic.  Eyes: Pupils are equal, round, and reactive to light. EOM are normal.  Neck: Normal range of motion. Neck supple.  Cardiovascular: Normal rate and regular rhythm.  Respiratory: Effort normal and breath sounds normal.  GI: Soft. Bowel sounds are normal.  Musculoskeletal: Normal range of motion.  Neurological: She is alert and oriented to person, place, and time.  Patient is aphasic.  Patient follows basic commands. Can express basic thoughts in phrases and words. Speech non-fluent. RUE and RLE HP 3+ to 4-/5. LUE and LLE 4+ to 5/5. Mar[place] Kitchen   Skin: Skin is warm and dry.  Psychiatric: She has a normal mood and affect. Her behavior is normal.    Results for orders placed or performed during the hospital encounter of 04/04/18 (from the past 48 hour(s))  RPR     Status: None   Collection Time: 04/06/18  4:23 AM  Result Value Ref Range   RPR Ser Ql Non Reactive Non Reactive    Comment: (NOTE) Performed At: [facility] [place], [place] [id number] Rush Farmer MD [id number]   Sedimentation rate     Status: None   Collection Time: 04/06/18  4:23 AM  Result Value Ref Range   Sed Rate 9 0 - 22 mm/hr    Comment: Performed at [facility], [place] [address], [place], [place] [address]  C-reactive protein     Status: None   Collection Time: 04/06/18   4:23 AM  Result Value Ref Range   CRP <0.8 <1.0 mg/dL    Comment: Performed at [facility], [place] [address], [place], [place] [address]  CBC with Differential/Platelet     Status: Abnormal   Collection Time: 04/06/18  4:23 AM  Result Value Ref Range   WBC 3.8 (L) 4.0 - 10.5 K/uL   RBC 4.64 3.87 - 5.11 MIL/uL   Hemoglobin 9.7 (L) 12.0 - 15.0 g/dL   HCT 33.4 (L) 36.0 - 46.0 %   MCV 72.0 (L) 80.0 - 100.0 fL   MCH 20.9 (L) 26.0 - 34.0 pg   MCHC 29.0 (L) 30.0 - 36.0 g/dL   RDW 22.9 (H) 11.5 - 15.5 %   Platelets 287 150 - 400 K/uL   nRBC 0.0 0.0 - 0.2 %   Neutrophils Relative % 50 %   Neutro Abs 1.9 1.7 - 7.7 K/uL   Lymphocytes Relative 35 %   Lymphs Abs 1.3 0.7 - 4.0 K/uL   Monocytes Relative 12 %   Monocytes Absolute 0.5 0.1 - 1.0 K/uL   Eosinophils Relative 2 %   Eosinophils Absolute 0.1 0.0 - 0.5 K/uL   Basophils Relative 1 %   Basophils Absolute 0.0 0.0 - 0.1 K/uL   Immature Granulocytes 0 %  Abs Immature Granulocytes 0.01 0.00 - 0.07 K/uL   Polychromasia PRESENT     Comment: Performed at [facility], [place] [address] [place], [place] [address]  Basic metabolic panel     Status: Abnormal   Collection Time: 04/06/18  4:23 AM  Result Value Ref Range   Sodium 139 135 - 145 mmol/L   Potassium 3.6 3.5 - 5.1 mmol/L   Chloride 107 98 - 111 mmol/L   CO2 26 22 - 32 mmol/L   Glucose, Bld 108 (H) 70 - 99 mg/dL   BUN 12 6 - 20 mg/dL   Creatinine, Ser 0.62 0.44 - 1.00 mg/dL   Calcium 9.4 8.9 - 10.3 mg/dL   GFR calc non Af Amer >60 >60 mL/min   GFR calc Af Amer >60 >60 mL/min    Comment: (NOTE) The eGFR has been calculated using the CKD EPI equation. This calculation has not been validated in all clinical situations. eGFR's persistently <60 mL/min signify possible Chronic Kidney Disease.    Anion gap 6 5 - 15    Comment: Performed at [place] [facility] [address], [place], [place] [address]  Magnesium     Status: None   Collection Time: 04/06/18  4:23 AM    Result Value Ref Range   Magnesium 2.0 1.7 - 2.4 mg/dL    Comment: Performed at [facility] [address] [place], [place] [address]  CSF cell count with differential collection tube #: 2     Status: Abnormal   Collection Time: 04/06/18  6:53 PM  Result Value Ref Range   Tube # 2    Color, CSF COLORLESS COLORLESS   Appearance, CSF CLEAR CLEAR   Supernatant NOT INDICATED    RBC Count, CSF 114 (H) 0 /cu mm   WBC, CSF 1 0 - 5 /cu mm   Lymphs, CSF RARE 40 - 80 %   Other Cells, CSF TOO FEW TO COUNT, SMEAR AVAILABLE FOR REVIEW     Comment: Performed at [place] [address] [place], [place] [address]  CSF cell count with differential collection tube #: 4     Status: Abnormal   Collection Time: 04/06/18  6:53 PM  Result Value Ref Range   Tube # 4    Color, CSF COLORLESS COLORLESS   Appearance, CSF CLEAR CLEAR   Supernatant NOT INDICATED    RBC Count, CSF 142 (H) 0 /cu mm   WBC, CSF 1 0 - 5 /cu mm   Lymphs, CSF RARE 40 - 80 %   Other Cells, CSF TOO FEW TO COUNT, SMEAR AVAILABLE FOR REVIEW     Comment: Performed at [facility] [address], [place], [place] [address]  CSF culture     Status: None (Preliminary result)   Collection Time: 04/06/18  6:53 PM  Result Value Ref Range   Specimen Description CSF    Special Requests NONE    Gram Stain      NO WBC SEEN NO ORGANISMS SEEN CYTOSPIN SMEAR Performed at [facility], [place] [address] [place], [place] [address]    Culture PENDING    Report Status PENDING   Protein and glucose, CSF     Status: None   Collection Time: 04/06/18  6:53 PM  Result Value Ref Range   Glucose, CSF 68 40 - 70 mg/dL   Total  Protein, CSF 34 15 - 45 mg/dL    Comment: Performed  at [facility], [place] [address], [place], [place] [address]  Cryptococcal antigen, CSF     Status: Abnormal   Collection Time: 04/06/18  6:53 PM  Result Value Ref Range   Crypto Ag NEGATIVE NEGATIVE   Cryptococcal Ag Titer NEGATIVE (A)  NOT INDICATED    Comment: Performed at [facility] [address], [place], [place] [address]   No results found.     Medical Problem List and Plan: 1.  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  DVT Prophylaxis/Anticoagulation: SCDs.  Venous Doppler studies negative 3. Pain Management: Tylenol as needed 4. Mood: Provide emotional support 5. Neuropsych: This patient is capable of making decisions on her own behalf. 6. Skin/Wound Care: Routine skin checks 7. Fluids/Electrolytes/Nutrition: Routine in and outs with follow-up chemistries 8.  Hypertension.  Permissive hypertension.  Patient on Lopressor 100 mg twice daily prior to admission question medical compliance.  Resume as needed 9.  History of epilepsy/seizure disorder as well as work-up for possible MS.  No current seizure medication.  Plan follow-up outpatient neurology services as patient has been seen by Dr. Tomi Likens in the past.  LP results 04/06/2018 pending 10.  Hyperlipidemia.  Lipitor   Post Admission Physician Evaluation: 1. Functional deficits secondary  to left MCA infarct. 2. Patient is admitted to receive collaborative, interdisciplinary care between the physiatrist, rehab nursing staff, and therapy team. 3. Patient's level of medical complexity and substantial therapy needs in context of that medical necessity cannot be provided at a lesser intensity of care such as a SNF. 4. Patient has experienced substantial functional loss from his/her baseline which was documented above under the "Functional History" and "Functional Status" headings.  Judging by the patient's diagnosis, physical exam, and functional history, the patient has potential for functional progress which will result in measurable gains while on inpatient rehab.  These gains will be of substantial and practical use upon discharge  in facilitating mobility and self-care at the household  level. 5. Physiatrist will provide 24 hour management of medical needs as well as oversight of the therapy plan/treatment and provide guidance as appropriate regarding the interaction of the two. 6. The Preadmission Screening has been reviewed and patient status is unchanged unless otherwise stated above. 7. 24 hour rehab nursing will assist with bladder management, bowel management, safety, skin/wound care, disease management, medication administration and patient education  and help integrate therapy concepts, techniques,education, etc. 8. PT will assess and treat for/with: Lower extremity strength, range of motion, stamina, balance, functional mobility, safety, adaptive techniques and equipment, NMR, family ed.   Goals are: mod I. 9. OT will assess and treat for/with: ADL's, functional mobility, safety, upper extremity strength, adaptive techniques and equipment, NMR, family ed.   Goals are: mod I. Therapy may proceed with showering this patient. 10. SLP will assess and treat for/with: cognition, language.  Goals are: supervision. 11. Case Management and Social Worker will assess and treat for psychological issues and discharge planning. 12. Team conference will be held weekly to assess progress toward goals and to determine barriers to discharge. 13. Patient will receive at least 3 hours of therapy per day at least 5 days per week. 14. ELOS: 10-13 days       15. Prognosis:  excellent   I have personally performed a face to face diagnostic evaluation of this patient and formulated the key components of the plan.  Additionally, I have personally reviewed  laboratory data, imaging studies, as well as relevant notes and concur with the physician assistant's documentation above.  [name], MD, FAAPMR    Lavon Paganini [place], PA-C 04/07/2018

## 2018-04-07 NOTE — Progress Notes (Signed)
Physical Medicine and Rehabilitation Consult Reason for Consult: Right side weakness and aphasia Referring Physician: [facility]   HPI: Tina Moss is a 47 y.o. right-handed female with history of epilepsy, hypertension, hyperlipidemia and seizure disorder as well as medical noncompliance.  Presented 04/04/2018 with right-sided weakness and aphasia.  Per chart review patient lives with boyfriend.  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  One level home.  Cranial CT scan showed small area of cytotoxic edema in the left MCA M4 segment without hemorrhage or mass-effect.  Underlying chronic bilateral cerebral white matter disease.  CT angiogram of head and neck negative for large vessel occlusion.  Patient did not receive TPA.  Urine drug screen negative.  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  Negative head MRA.  Echocardiogram with ejection fraction of 65% no wall motion abnormalities.  TEE is pending.  Presently maintained on aspirin for CVA prophylaxis.  Therapy evaluation completed with recommendations of physical medicine rehab consult.   Review of Systems  Constitutional: Negative for chills and fever.  HENT: Negative for hearing loss.   Eyes: Negative for blurred vision and double vision.  Respiratory: Negative for cough and shortness of breath.   Cardiovascular: Negative for chest pain and palpitations.  Gastrointestinal: Positive for constipation. Negative for nausea and vomiting.  Genitourinary: Negative for dysuria, flank pain and hematuria.  Musculoskeletal: Positive for myalgias.  Skin: Negative for rash.  Neurological: Positive for speech change, focal weakness and seizures.  All other systems reviewed and are negative.      Past Medical History:  Diagnosis Date  . Anemia   . Epilepsy ([place])   . Fibroids   . H/O bacterial infection   . H/O mumps   . Hyperlipidemia   . Hypertension   . Seizures ([place])           Past Surgical History:  Procedure Laterality Date  . CESAREAN SECTION     x2. at term  . TUBAL LIGATION    . WISDOM TOOTH EXTRACTION          Family History  Problem Relation Age of Onset  . Hypertension Mother   . Hypertension Sister   . Arthritis Sister        knees  . Hypertension Brother   . Deafness Brother   . Speech disorder Brother        mute  . Mental illness Brother        "he can just fly off"  . Mental illness Daughter        ? bipolar  . Scoliosis Son    Social History:  reports that she has been smoking cigars. She uses smokeless tobacco. She reports that she drinks alcohol. She reports that she has current or past drug history. Drug: Marijuana. Allergies: No Known Allergies       Medications Prior to Admission  Medication Sig Dispense Refill  . OVER THE COUNTER MEDICATION as needed. CR4 weight gain pill    . cyclobenzaprine (FLEXERIL) 10 MG tablet Take 1 tablet (10 mg total) by mouth at bedtime. (Patient not taking: Reported on 04/04/2018) 30 tablet 2  . ferrous sulfate 325 (65 FE) MG tablet Take 1 tablet (325 mg total) by mouth daily with breakfast. 60 tablet 3  . medroxyPROGESTERone (DEPO-PROVERA) 150 MG/ML injection Inject 1 mL (150 mg total) into the muscle every 3 (three) months. (Patient not taking: Reported on 04/04/2018) 1 mL 3  . metoprolol (LOPRESSOR) 100 MG tablet  Take 1 tablet (100 mg total) by mouth 2 (two) times daily. (Patient not taking: Reported on 04/04/2018) 60 tablet 3  . venlafaxine XR (EFFEXOR XR) 75 MG 24 hr capsule Take 1 capsule (75 mg total) by mouth daily with breakfast. (Patient not taking: Reported on 04/04/2018) 30 capsule 3    Home: Home Living Family/patient expects to be discharged to:: Private residence Living Arrangements: Spouse/significant other Available Help at Discharge: Family, Other (Comment)(as needed) Type of Home: Apartment Home Access: Stairs to enter [employer] of Steps:  flight Entrance Stairs-Rails: Right, Left Home Layout: One level Bathroom Shower/Tub: Chiropodist: Standard Home Equipment: Grab bars - tub/shower  Functional History: Prior Function Level of Independence: Independent Comments: works as a Secretary/administrator, I, drives, runs errands. Functional Status:  Mobility: Bed Mobility Overal bed mobility: Needs Assistance Bed Mobility: Supine to Sit Supine to sit: Min guard General bed mobility comments: OOB with PT upon arrival Transfers Overall transfer level: Needs assistance Equipment used: Rolling walker (2 wheeled) Transfers: Sit to/from Stand Sit to Stand: Min assist, Mod assist General transfer comment: assist to rise and steady with cues for safety; poor ecentric control requiring increased assist and verbal cues for fully backing up to seated surface prior to sitting Ambulation/Gait Ambulation/Gait assistance: Min assist, +2 safety/equipment Gait Distance (Feet): 20 Feet Assistive device: Rolling walker (2 wheeled) Gait Pattern/deviations: Step-to pattern General Gait Details: Deliberate step to gait pattern, moderate use of the RW.  Halted at times. Gait velocity: slow Gait velocity interpretation: <1.31 ft/sec, indicative of household ambulator  ADL: ADL Overall ADL's : Needs assistance/impaired Eating/Feeding: Set up, Sitting Grooming: Minimal assistance, Sitting Upper Body Bathing: Min guard, Minimal assistance, Sitting Lower Body Bathing: Moderate assistance, Sit to/from stand Upper Body Dressing : Minimal assistance, Sitting Lower Body Dressing: Moderate assistance, Sit to/from stand Lower Body Dressing Details (indicate cue type and reason): minA standing balance Toilet Transfer: Minimal assistance, Ambulation, Grab bars, RW Toileting- Clothing Manipulation and Hygiene: Minimal assistance, Sit to/from stand Toileting - Clothing Manipulation Details (indicate cue type and reason): assist for management  of gown/mesh briefs; pt performing peri-care seated on toilet Functional mobility during ADLs: Minimal assistance, Rolling walker  Cognition: Cognition Overall Cognitive Status: Impaired/Different from baseline Arousal/Alertness: Awake/alert Orientation Level: Oriented X4 Attention: Sustained, Selective Sustained Attention: Appears intact Selective Attention: Appears intact Memory: Appears intact(able to recall 5 items slp was bringing her within 5 minutes) Awareness: Appears intact Problem Solving: Appears intact(need to use call bell due to unsteadiness on her feet) Safety/Judgment: Appears intact Cognition Arousal/Alertness: Awake/alert Behavior During Therapy: WFL for tasks assessed/performed Overall Cognitive Status: Impaired/Different from baseline Area of Impairment: Safety/judgement, Problem solving, Following commands(not tested formally overall) Following Commands: Follows one step commands with increased time Safety/Judgement: Decreased awareness of safety Problem Solving: Slow processing, Requires verbal cues, Requires tactile cues  Blood pressure (!) 136/96, pulse 85, temperature 98 F (36.7 C), temperature source Oral, resp. rate 18, height 5\' 7"  (1.702 m), last menstrual period 03/21/2018, SpO2 99 %. Physical Exam  Constitutional: She appears well-developed.  HENT:  Head: Normocephalic.  Cardiovascular: Normal rate.  Respiratory: Effort normal.  GI: Soft.  Neurological:  Patient is alert.  Made eye contact with examiner.  Patient is aphasic.  Able to communicate with extra time and with single words and short phrases. Comprehension seems reasonable. Right central 7. Speech sl dysarthric.  RUE 4-/5. RLE 3+ to 4/5. LUE and LLE 4+ to 5./5. Sensation diminished to pain and light touch RUE and  RLE  Skin: Skin is warm and dry.  Psychiatric: She has a normal mood and affect. Her behavior is normal.    LabResultsLast24Hours       Results for orders placed or  performed during the hospital encounter of 04/04/18 (from the past 24 hour(s))  C-reactive protein     Status: None   Collection Time: 04/06/18  4:23 AM  Result Value Ref Range   CRP <0.8 <1.0 mg/dL  CBC with Differential/Platelet     Status: Abnormal   Collection Time: 04/06/18  4:23 AM  Result Value Ref Range   WBC 3.8 (L) 4.0 - 10.5 K/uL   RBC 4.64 3.87 - 5.11 MIL/uL   Hemoglobin 9.7 (L) 12.0 - 15.0 g/dL   HCT 33.4 (L) 36.0 - 46.0 %   MCV 72.0 (L) 80.0 - 100.0 fL   MCH 20.9 (L) 26.0 - 34.0 pg   MCHC 29.0 (L) 30.0 - 36.0 g/dL   RDW 22.9 (H) 11.5 - 15.5 %   Platelets 287 150 - 400 K/uL   nRBC 0.0 0.0 - 0.2 %   Neutrophils Relative % 50 %   Neutro Abs 1.9 1.7 - 7.7 K/uL   Lymphocytes Relative 35 %   Lymphs Abs 1.3 0.7 - 4.0 K/uL   Monocytes Relative 12 %   Monocytes Absolute 0.5 0.1 - 1.0 K/uL   Eosinophils Relative 2 %   Eosinophils Absolute 0.1 0.0 - 0.5 K/uL   Basophils Relative 1 %   Basophils Absolute 0.0 0.0 - 0.1 K/uL   Immature Granulocytes 0 %   Abs Immature Granulocytes 0.01 0.00 - 0.07 K/uL   Polychromasia PRESENT   Basic metabolic panel     Status: Abnormal   Collection Time: 04/06/18  4:23 AM  Result Value Ref Range   Sodium 139 135 - 145 mmol/L   Potassium 3.6 3.5 - 5.1 mmol/L   Chloride 107 98 - 111 mmol/L   CO2 26 22 - 32 mmol/L   Glucose, Bld 108 (H) 70 - 99 mg/dL   BUN 12 6 - 20 mg/dL   Creatinine, Ser 0.62 0.44 - 1.00 mg/dL   Calcium 9.4 8.9 - 10.3 mg/dL   GFR calc non Af Amer >60 >60 mL/min   GFR calc Af Amer >60 >60 mL/min   Anion gap 6 5 - 15  Magnesium     Status: None   Collection Time: 04/06/18  4:23 AM  Result Value Ref Range   Magnesium 2.0 1.7 - 2.4 mg/dL      ImagingResults(Last48hours)  Ct Angio Head W Or Wo Contrast  Result Date: 04/04/2018 CLINICAL DATA:  Code stroke. 47 year old female with slurred speech this morning. ASPECTS 9 with abnormal left MCA M4 segment on plain head CT.  EXAM: CT ANGIOGRAPHY HEAD AND NECK TECHNIQUE: Multidetector CT imaging of the head and neck was performed using the standard protocol during bolus administration of intravenous contrast. Multiplanar CT image reconstructions and MIPs were obtained to evaluate the vascular anatomy. Carotid stenosis measurements (when applicable) are obtained utilizing NASCET criteria, using the distal internal carotid diameter as the denominator. CONTRAST:  94mL ISOVUE-370 IOPAMIDOL (ISOVUE-370) INJECTION 76% COMPARISON:  Head CT 1140 hours today. Chest radiographs 03/29/2017. FINDINGS: CTA NECK Skeleton: No acute osseous abnormality identified. Upper chest: Partially visible left lung density on series 11, image 160 5 May be due to elevated left hemidiaphragm. Otherwise negative visible lung parenchyma. No superior mediastinal lymphadenopathy. Other neck: Negative, no neck mass or lymphadenopathy. Aortic arch: 3 vessel  arch configuration with minimal arch atherosclerosis. Right carotid system: No brachiocephalic artery or right CCA origin plaque or stenosis. Mildly tortuous proximal right CCA. Negative right carotid bifurcation and cervical right ICA. Left carotid system: Normal left CCA origin. Mildly tortuous proximal left CCA. Negative left carotid bifurcation. Negative cervical left ICA. Vertebral arteries: Tortuous proximal right subclavian artery with a mildly kinked appearance but no atherosclerosis. Normal right vertebral artery origin. Patent right vertebral artery to the skull base without stenosis. Mild proximal left subclavian artery atherosclerosis without stenosis. Normal left vertebral artery origin. Patent left vertebral artery to the skull base without stenosis. CTA HEAD Posterior circulation: Patent distal vertebral arteries, the right is somewhat non dominant B on the patent right PICA origin. Normal left PICA origin. No distal vertebral scratched at there is distal vertebral artery irregularity but no associated  stenosis. Patent vertebrobasilar junction and basilar artery with similar mild irregularity but no basilar stenosis. Fetal type right PCA origin. SCA and left PCA origins are within normal limits. Left Posterior communicating artery is diminutive or absent. The bilateral PCA branches are patent and symmetric with mild irregularity. Anterior circulation: Patent left ICA siphon without stenosis. Patent left ICA terminus. Left MCA and ACA origins are normal. Normal left A1 segment. Anterior communicating artery and bilateral ACA branches are within normal limits. Left MCA M1 segment is patent and bifurcates early. Left MCA bifurcation is patent without stenosis. The left MCA M2 branches are patent. No discrete left MCA branch occlusion is identified. Patent right ICA siphon with no plaque or stenosis. Normal right posterior communicating artery origin. Patent right ICA terminus. Normal right MCA and ACA origins. Right MCA M1 segment, right MCA bifurcation, and right MCA M2 branches are patent. The distal right MCA branches appear symmetric to those on the left Venous sinuses: Patent. Anatomic variants: Mildly dominant distal left vertebral artery. Fetal type right PCA origin. Delayed phase: No abnormal enhancement identified. Stable gray-white matter differentiation throughout the brain. Review of the MIP images confirms the above findings IMPRESSION: 1. Negative for large vessel occlusion and NO discrete Left MCA branch occlusion identified to correspond to the evidence of small anterior left MCA division cortical infarct. This result was communicated to Dr. Lorraine Lax at at 1218 hours by text page via the [facility] messaging system. 2. No arterial atherosclerosis or stenosis in the neck. Mildly irregular appearance of the intracranial arteries diffusely which is nonspecific. No intracranial plaque or hemodynamically significant stenosis identified. 3. No acute findings in the neck and presumed chronic elevation of the left  hemidiaphragm responsible for partially visible left lung opacity on the most caudal image. Electronically Signed   By: Genevie Ann M.D.   On: 04/04/2018 12:26   Dg Chest 2 View  Result Date: 04/04/2018 CLINICAL DATA:  Acute stroke, slurred speech, right-sided weakness and numbness EXAM: CHEST - 2 VIEW COMPARISON:  Chest x-ray of 03/29/2017 FINDINGS: There is little change in chronic elevation of the left hemidiaphragm. No pneumonia or effusion is seen. Mediastinal and hilar contours are unremarkable and the heart is within normal limits in size. No bony abnormality is seen. IMPRESSION: No active cardiopulmonary disease. Electronically Signed   By: Ivar Drape M.D.   On: 04/04/2018 15:42   Ct Angio Neck W Or Wo Contrast  Result Date: 04/04/2018 CLINICAL DATA:  Code stroke. 47 year old female with slurred speech this morning. ASPECTS 9 with abnormal left MCA M4 segment on plain head CT. EXAM: CT ANGIOGRAPHY HEAD AND NECK TECHNIQUE: Multidetector CT imaging  of the head and neck was performed using the standard protocol during bolus administration of intravenous contrast. Multiplanar CT image reconstructions and MIPs were obtained to evaluate the vascular anatomy. Carotid stenosis measurements (when applicable) are obtained utilizing NASCET criteria, using the distal internal carotid diameter as the denominator. CONTRAST:  44mL ISOVUE-370 IOPAMIDOL (ISOVUE-370) INJECTION 76% COMPARISON:  Head CT 1140 hours today. Chest radiographs 03/29/2017. FINDINGS: CTA NECK Skeleton: No acute osseous abnormality identified. Upper chest: Partially visible left lung density on series 11, image 160 5 May be due to elevated left hemidiaphragm. Otherwise negative visible lung parenchyma. No superior mediastinal lymphadenopathy. Other neck: Negative, no neck mass or lymphadenopathy. Aortic arch: 3 vessel arch configuration with minimal arch atherosclerosis. Right carotid system: No brachiocephalic artery or right CCA origin  plaque or stenosis. Mildly tortuous proximal right CCA. Negative right carotid bifurcation and cervical right ICA. Left carotid system: Normal left CCA origin. Mildly tortuous proximal left CCA. Negative left carotid bifurcation. Negative cervical left ICA. Vertebral arteries: Tortuous proximal right subclavian artery with a mildly kinked appearance but no atherosclerosis. Normal right vertebral artery origin. Patent right vertebral artery to the skull base without stenosis. Mild proximal left subclavian artery atherosclerosis without stenosis. Normal left vertebral artery origin. Patent left vertebral artery to the skull base without stenosis. CTA HEAD Posterior circulation: Patent distal vertebral arteries, the right is somewhat non dominant B on the patent right PICA origin. Normal left PICA origin. No distal vertebral scratched at there is distal vertebral artery irregularity but no associated stenosis. Patent vertebrobasilar junction and basilar artery with similar mild irregularity but no basilar stenosis. Fetal type right PCA origin. SCA and left PCA origins are within normal limits. Left Posterior communicating artery is diminutive or absent. The bilateral PCA branches are patent and symmetric with mild irregularity. Anterior circulation: Patent left ICA siphon without stenosis. Patent left ICA terminus. Left MCA and ACA origins are normal. Normal left A1 segment. Anterior communicating artery and bilateral ACA branches are within normal limits. Left MCA M1 segment is patent and bifurcates early. Left MCA bifurcation is patent without stenosis. The left MCA M2 branches are patent. No discrete left MCA branch occlusion is identified. Patent right ICA siphon with no plaque or stenosis. Normal right posterior communicating artery origin. Patent right ICA terminus. Normal right MCA and ACA origins. Right MCA M1 segment, right MCA bifurcation, and right MCA M2 branches are patent. The distal right MCA branches  appear symmetric to those on the left Venous sinuses: Patent. Anatomic variants: Mildly dominant distal left vertebral artery. Fetal type right PCA origin. Delayed phase: No abnormal enhancement identified. Stable gray-white matter differentiation throughout the brain. Review of the MIP images confirms the above findings IMPRESSION: 1. Negative for large vessel occlusion and NO discrete Left MCA branch occlusion identified to correspond to the evidence of small anterior left MCA division cortical infarct. This result was communicated to Dr. Lorraine Lax at at 1218 hours by text page via the [facility] messaging system. 2. No arterial atherosclerosis or stenosis in the neck. Mildly irregular appearance of the intracranial arteries diffusely which is nonspecific. No intracranial plaque or hemodynamically significant stenosis identified. 3. No acute findings in the neck and presumed chronic elevation of the left hemidiaphragm responsible for partially visible left lung opacity on the most caudal image. Electronically Signed   By: Genevie Ann M.D.   On: 04/04/2018 12:26   Mr Brain Wo Contrast  Result Date: 04/04/2018 CLINICAL DATA:  Dysarthria.  Left frontal infarct on  CT. EXAM: MRI HEAD WITHOUT CONTRAST MRA HEAD WITHOUT CONTRAST TECHNIQUE: Multiplanar, multiecho pulse sequences of the brain and surrounding structures were obtained without intravenous contrast. Angiographic images of the head were obtained using MRA technique without contrast. COMPARISON:  Head CT and CTA 04/04/2018 and MRI 03/10/2017 FINDINGS: MRI HEAD FINDINGS Brain: There is a small acute cortical infarct in the posterior left frontal lobe corresponding to the abnormality on CT. No intracranial hemorrhage, mass, midline shift, or extra-axial fluid collection is identified. Bilateral cerebral white matter T2 hyperintensities, most notable in the centrum semiovale, are similar to the prior MRI with the appearance of multiple chronic lacunar infarcts. The  ventricles are normal in size. Vascular: Major intracranial vascular flow voids are preserved. Skull and upper cervical spine: Unremarkable bone marrow signal. Sinuses/Orbits: Unremarkable orbits. Minimal right frontal sinus mucosal thickening. Clear mastoid air cells. Other: None. MRA HEAD FINDINGS The visualized distal vertebral arteries are widely patent to the basilar and codominant. Patent bilateral PICA, right AICA, and bilateral SCA origins are visualized. The basilar artery is widely patent. Both PCAs are patent without evidence of significant proximal stenosis. There is a fetal origin of the right PCA. The internal carotid arteries are widely patent from skull base to carotid termini. ACAs and MCAs are patent without evidence of proximal branch occlusion or significant stenosis. No aneurysm is identified. IMPRESSION: 1. Small acute posterior left frontal lobe infarct (MCA territory). 2. 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 3. Negative head MRA. Electronically Signed   By: Logan Bores M.D.   On: 04/04/2018 16:38   Mr Jodene Nam Head Wo Contrast  Result Date: 04/04/2018 CLINICAL DATA:  Dysarthria.  Left frontal infarct on CT. EXAM: MRI HEAD WITHOUT CONTRAST MRA HEAD WITHOUT CONTRAST TECHNIQUE: Multiplanar, multiecho pulse sequences of the brain and surrounding structures were obtained without intravenous contrast. Angiographic images of the head were obtained using MRA technique without contrast. COMPARISON:  Head CT and CTA 04/04/2018 and MRI 03/10/2017 FINDINGS: MRI HEAD FINDINGS Brain: There is a small acute cortical infarct in the posterior left frontal lobe corresponding to the abnormality on CT. No intracranial hemorrhage, mass, midline shift, or extra-axial fluid collection is identified. Bilateral cerebral white matter T2 hyperintensities, most notable in the centrum semiovale, are  similar to the prior MRI with the appearance of multiple chronic lacunar infarcts. The ventricles are normal in size. Vascular: Major intracranial vascular flow voids are preserved. Skull and upper cervical spine: Unremarkable bone marrow signal. Sinuses/Orbits: Unremarkable orbits. Minimal right frontal sinus mucosal thickening. Clear mastoid air cells. Other: None. MRA HEAD FINDINGS The visualized distal vertebral arteries are widely patent to the basilar and codominant. Patent bilateral PICA, right AICA, and bilateral SCA origins are visualized. The basilar artery is widely patent. Both PCAs are patent without evidence of significant proximal stenosis. There is a fetal origin of the right PCA. The internal carotid arteries are widely patent from skull base to carotid termini. ACAs and MCAs are patent without evidence of proximal branch occlusion or significant stenosis. No aneurysm is identified. IMPRESSION: 1. Small acute posterior left frontal lobe infarct (MCA territory). 2. 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 3. Negative head MRA. Electronically Signed   By: Logan Bores M.D.   On: 04/04/2018 16:38   Ct Head Code Stroke Wo Contrast  Result Date: 04/04/2018 CLINICAL DATA:  Code stroke. 47 year old female with slurred speech this morning. EXAM: CT HEAD WITHOUT CONTRAST TECHNIQUE: Contiguous axial images were obtained from the base of the skull through the vertex without intravenous contrast. COMPARISON:  Brain MRI 03/10/2017.  Head CT 09/15/2016. FINDINGS: Brain: Chronic cerebral white matter hypodense foci as demonstrated on the prior studies appear stable since 2018. Small area of new cortical hypodensity in the left middle frontal gyrus (series 3, image 22). No associated hemorrhage or mass effect. No other changes of acute cortical infarct. Stable cerebral volume. No ventriculomegaly.  Basilar cisterns remain normal. Vascular: Mild Calcified atherosclerosis at the skull base. No suspicious intracranial vascular hyperdensity. Skull: Stable and negative. Sinuses/Orbits: Visualized paranasal sinuses and mastoids are stable and well pneumatized. Other: Visualized orbits and scalp soft tissues are within normal limits. ASPECTS [facility] Stroke Program Early CT Score) - Ganglionic level infarction (caudate, lentiform nuclei, internal capsule, insula, M1-M3 cortex): 7 - Supraganglionic infarction (M4-M6 cortex): 2 (abnormal M4 segment). Total score (0-10 with 10 being normal): 9 IMPRESSION: 1. Small area of cytotoxic edema in the left MCA M4 segment without hemorrhage or mass effect. ASPECTS is 9. 2. Underlying chronic bilateral cerebral white matter disease. 3. CTA head and neck reported separately. 4. These results were communicated to Dr. Lorraine Lax at 12:11 pmon 10/22/2019by text page via the [facility] messaging system. Electronically Signed   By: Genevie Ann M.D.   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 Does the need for close, 24 hr/day medical supervision in concert with the patient's rehab needs make it unreasonable for this patient to be served in a less intensive setting? Yes 2. Co-Morbidities requiring supervision/potential complications: htn, epilepsy, post-stroke sequelae 3. Due to bladder management, bowel management, safety, skin/wound care, disease management, medication administration and patient education, does the patient require 24 hr/day rehab nursing? Yes 4. Does the patient require coordinated care of a physician, rehab nurse, PT (1-2 hrs/day, 5 days/week), OT (1-2 hrs/day, 5 days/week) and SLP (1-2 hrs/day, 5 days/week) to address physical and functional deficits in the context of the above medical diagnosis(es)? Yes Addressing deficits in the following areas: balance, endurance, locomotion, strength,  transferring, bowel/bladder control, bathing, dressing, feeding, grooming, toileting, cognition, speech, language and psychosocial support 5. Can the patient actively participate in an intensive therapy program of at least 3 hrs of therapy per day at least 5 days per week? Yes 6. The potential for patient to make measurable gains while on inpatient rehab is excellent 7. Anticipated functional outcomes upon discharge from inpatient rehab are modified independent  with PT, modified independent with OT, modified independent and supervision with SLP. 8. Estimated rehab length of stay to reach the above functional goals is: 13-17 days 9. Anticipated D/C setting: Home 10. Anticipated post D/C treatments: HH therapy and Outpatient therapy 11. Overall Rehab/Functional Prognosis: excellent  RECOMMENDATIONS: This patient's condition is appropriate for continued rehabilitative care in the following setting: CIR Patient has agreed to participate in recommended program. Yes Note that insurance prior authorization may be required for reimbursement for recommended care.  Comment: Rehab Admissions Coordinator to follow up.  Thanks,  [name], MD, Mellody Drown  I have personally performed a face to face diagnostic evaluation of this patient. Additionally, I have reviewed and concur with the physician assistant's documentation above.    Lavon Paganini Angiulli, PA-C 04/06/2018        Revision History  Routing History

## 2018-04-07 NOTE — Discharge Summary (Signed)
Physician Discharge Summary  Tina Moss ZOX:[id number] DOB: 12-08-1970 DOA: 04/04/2018  PCP: Charlott Rakes, MD  Admit date: 04/04/2018 Discharge date: 04/07/2018  Admitted From: Home Disposition: CIR  Recommendations for Outpatient Follow-up:  1. Follow up with CIR provider at earliest convenience 2. Outpatient follow-up with neurology   Home Health: No Equipment/Devices: None  Discharge Condition: Stable CODE STATUS: Full Diet recommendation: Heart Healthy /diet as per SLP recommendations  Brief/Interim Summary: 47 year old female with history of anemia, epilepsy, fibroids, mumps, hypertension, hyperlipidemia, seizures presented on 04/04/2018 with slurred speech.  Patient was admitted with acute stroke.  Neurology was consulted.  Patient underwent LP by neurology on 04/06/2018.  Plavix was started on 04/07/2018 on top of aspirin.  PT recommended CIR.  Patient will be discharged to CIR today.   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  CTA head and neck unremarkable. -2D echo showed EF of 60 to 65%.  TEE done today was normal -Neurology following.   -LDL 121.  Continue statin.  Continue aspirin.  Plavix has been started from today onwards as per neurology recommendations.  Outpatient follow-up with neurology. -PT/OT is recommending CIR.   discharge to CIR today.  Diet as per SLP recommendations  Question of multiple sclerosis -Not on any treatment -Status post LP done on 04/06/2018.  Outpatient follow-up with neurology  Seizure disorder -Not on any antiepileptics at home currently.  Chronic anemia -Hemoglobin stable.  Outpatient  follow-up  Essential hypertension -Monitor blood pressure.    Outpatient follow-up.  Tobacco use -Tobacco cessation encouraged by admitting hospitalist.  Discharge Instructions  Discharge Instructions    Ambulatory referral to Neurology   Complete by:  As directed    An appointment is requested in approximately:2-3 weeks. Followup for stroke   Call MD for:  difficulty breathing, headache or visual disturbances   Complete by:  As directed    Call MD for:  extreme fatigue   Complete by:  As directed    Call MD for:  hives   Complete by:  As directed    Call MD for:  persistant dizziness or light-headedness   Complete by:  As directed    Call MD for:  persistant nausea and vomiting   Complete by:  As directed    Call MD for:  severe uncontrolled pain   Complete by:  As directed    Call MD for:  temperature >100.4   Complete by:  As directed    Diet - low sodium heart healthy   Complete by:  As directed    Increase activity slowly   Complete by:  As directed      Allergies as of 04/07/2018   No Known Allergies     Medication List    STOP taking these medications   cyclobenzaprine 10 MG tablet Commonly known as:  FLEXERIL   medroxyPROGESTERone 150 MG/ML injection Commonly known as:  DEPO-PROVERA   metoprolol tartrate 100 MG tablet Commonly known as:  LOPRESSOR   OVER THE COUNTER MEDICATION   venlafaxine XR 75 MG 24 hr capsule Commonly known as:  EFFEXOR-XR     TAKE these medications   aspirin 81 MG EC tablet Take 1 tablet (  81 mg total) by mouth daily. Start taking on:  04/08/2018   atorvastatin 20 MG tablet Commonly known as:  LIPITOR Take 1 tablet (20 mg total) by mouth daily at 6 PM.   clopidogrel 75 MG tablet Commonly known as:  PLAVIX Take 1 tablet (75 mg total) by mouth daily.   ferrous sulfate 325 (65 FE) MG tablet Take 1 tablet (325 mg total) by mouth daily with breakfast.      Follow-up Information    Charlott Rakes, MD. Schedule an  appointment as soon as possible for a visit in 1 week(s).   Specialty:  Family Medicine Contact information: [place] [address] [phone]          No Known Allergies  Consultations:  Neurology   Procedures/Studies: Ct Angio Head W Or Wo Contrast  Result Date: 04/04/2018 CLINICAL DATA:  Code stroke. 47 year old female with slurred speech this morning. ASPECTS 9 with abnormal left MCA M4 segment on plain head CT. EXAM: CT ANGIOGRAPHY HEAD AND NECK TECHNIQUE: Multidetector CT imaging of the head and neck was performed using the standard protocol during bolus administration of intravenous contrast. Multiplanar CT image reconstructions and MIPs were obtained to evaluate the vascular anatomy. Carotid stenosis measurements (when applicable) are obtained utilizing NASCET criteria, using the distal internal carotid diameter as the denominator. CONTRAST:  47mL ISOVUE-370 IOPAMIDOL (ISOVUE-370) INJECTION 76% COMPARISON:  Head CT 1140 hours today. Chest radiographs 03/29/2017. FINDINGS: CTA NECK Skeleton: No acute osseous abnormality identified. Upper chest: Partially visible left lung density on series 11, image 160 5 May be due to elevated left hemidiaphragm. Otherwise negative visible lung parenchyma. No superior mediastinal lymphadenopathy. Other neck: Negative, no neck mass or lymphadenopathy. Aortic arch: 3 vessel arch configuration with minimal arch atherosclerosis. Right carotid system: No brachiocephalic artery or right CCA origin plaque or stenosis. Mildly tortuous proximal right CCA. Negative right carotid bifurcation and cervical right ICA. Left carotid system: Normal left CCA origin. Mildly tortuous proximal left CCA. Negative left carotid bifurcation. Negative cervical left ICA. Vertebral arteries: Tortuous proximal right subclavian artery with a mildly kinked appearance but no atherosclerosis. Normal right vertebral artery origin. Patent right vertebral artery to  the skull base without stenosis. Mild proximal left subclavian artery atherosclerosis without stenosis. Normal left vertebral artery origin. Patent left vertebral artery to the skull base without stenosis. CTA HEAD Posterior circulation: Patent distal vertebral arteries, the right is somewhat non dominant B on the patent right PICA origin. Normal left PICA origin. No distal vertebral scratched at there is distal vertebral artery irregularity but no associated stenosis. Patent vertebrobasilar junction and basilar artery with similar mild irregularity but no basilar stenosis. Fetal type right PCA origin. SCA and left PCA origins are within normal limits. Left Posterior communicating artery is diminutive or absent. The bilateral PCA branches are patent and symmetric with mild irregularity. Anterior circulation: Patent left ICA siphon without stenosis. Patent left ICA terminus. Left MCA and ACA origins are normal. Normal left A1 segment. Anterior communicating artery and bilateral ACA branches are within normal limits. Left MCA M1 segment is patent and bifurcates early. Left MCA bifurcation is patent without stenosis. The left MCA M2 branches are patent. No discrete left MCA branch occlusion is identified. Patent right ICA siphon with no plaque or stenosis. Normal right posterior communicating artery origin. Patent right ICA terminus. Normal right MCA and ACA origins. Right MCA M1 segment, right MCA bifurcation, and right MCA M2 branches are patent. The distal right MCA branches  appear symmetric to those on the left Venous sinuses: Patent. Anatomic variants: Mildly dominant distal left vertebral artery. Fetal type right PCA origin. Delayed phase: No abnormal enhancement identified. Stable gray-white matter differentiation throughout the brain. Review of the MIP images confirms the above findings IMPRESSION: 1. Negative for large vessel occlusion and NO discrete Left MCA branch occlusion identified to correspond to the  evidence of small anterior left MCA division cortical infarct. This result was communicated to Dr. Lorraine Lax at at 1218 hours by text page via the [facility] messaging system. 2. No arterial atherosclerosis or stenosis in the neck. Mildly irregular appearance of the intracranial arteries diffusely which is nonspecific. No intracranial plaque or hemodynamically significant stenosis identified. 3. No acute findings in the neck and presumed chronic elevation of the left hemidiaphragm responsible for partially visible left lung opacity on the most caudal image. Electronically Signed   By: Genevie Ann M.D.   On: 04/04/2018 12:26   Dg Chest 2 View  Result Date: 04/04/2018 CLINICAL DATA:  Acute stroke, slurred speech, right-sided weakness and numbness EXAM: CHEST - 2 VIEW COMPARISON:  Chest x-ray of 03/29/2017 FINDINGS: There is little change in chronic elevation of the left hemidiaphragm. No pneumonia or effusion is seen. Mediastinal and hilar contours are unremarkable and the heart is within normal limits in size. No bony abnormality is seen. IMPRESSION: No active cardiopulmonary disease. Electronically Signed   By: Ivar Drape M.D.   On: 04/04/2018 15:42   Ct Angio Neck W Or Wo Contrast  Result Date: 04/04/2018 CLINICAL DATA:  Code stroke. 47 year old female with slurred speech this morning. ASPECTS 9 with abnormal left MCA M4 segment on plain head CT. EXAM: CT ANGIOGRAPHY HEAD AND NECK TECHNIQUE: Multidetector CT imaging of the head and neck was performed using the standard protocol during bolus administration of intravenous contrast. Multiplanar CT image reconstructions and MIPs were obtained to evaluate the vascular anatomy. Carotid stenosis measurements (when applicable) are obtained utilizing NASCET criteria, using the distal internal carotid diameter as the denominator. CONTRAST:  77mL ISOVUE-370 IOPAMIDOL (ISOVUE-370) INJECTION 76% COMPARISON:  Head CT 1140 hours today. Chest radiographs 03/29/2017. FINDINGS: CTA  NECK Skeleton: No acute osseous abnormality identified. Upper chest: Partially visible left lung density on series 11, image 160 5 May be due to elevated left hemidiaphragm. Otherwise negative visible lung parenchyma. No superior mediastinal lymphadenopathy. Other neck: Negative, no neck mass or lymphadenopathy. Aortic arch: 3 vessel arch configuration with minimal arch atherosclerosis. Right carotid system: No brachiocephalic artery or right CCA origin plaque or stenosis. Mildly tortuous proximal right CCA. Negative right carotid bifurcation and cervical right ICA. Left carotid system: Normal left CCA origin. Mildly tortuous proximal left CCA. Negative left carotid bifurcation. Negative cervical left ICA. Vertebral arteries: Tortuous proximal right subclavian artery with a mildly kinked appearance but no atherosclerosis. Normal right vertebral artery origin. Patent right vertebral artery to the skull base without stenosis. Mild proximal left subclavian artery atherosclerosis without stenosis. Normal left vertebral artery origin. Patent left vertebral artery to the skull base without stenosis. CTA HEAD Posterior circulation: Patent distal vertebral arteries, the right is somewhat non dominant B on the patent right PICA origin. Normal left PICA origin. No distal vertebral scratched at there is distal vertebral artery irregularity but no associated stenosis. Patent vertebrobasilar junction and basilar artery with similar mild irregularity but no basilar stenosis. Fetal type right PCA origin. SCA and left PCA origins are within normal limits. Left Posterior communicating artery is diminutive or absent. The bilateral PCA  branches are patent and symmetric with mild irregularity. Anterior circulation: Patent left ICA siphon without stenosis. Patent left ICA terminus. Left MCA and ACA origins are normal. Normal left A1 segment. Anterior communicating artery and bilateral ACA branches are within normal limits. Left MCA M1  segment is patent and bifurcates early. Left MCA bifurcation is patent without stenosis. The left MCA M2 branches are patent. No discrete left MCA branch occlusion is identified. Patent right ICA siphon with no plaque or stenosis. Normal right posterior communicating artery origin. Patent right ICA terminus. Normal right MCA and ACA origins. Right MCA M1 segment, right MCA bifurcation, and right MCA M2 branches are patent. The distal right MCA branches appear symmetric to those on the left Venous sinuses: Patent. Anatomic variants: Mildly dominant distal left vertebral artery. Fetal type right PCA origin. Delayed phase: No abnormal enhancement identified. Stable gray-white matter differentiation throughout the brain. Review of the MIP images confirms the above findings IMPRESSION: 1. Negative for large vessel occlusion and NO discrete Left MCA branch occlusion identified to correspond to the evidence of small anterior left MCA division cortical infarct. This result was communicated to Dr. Lorraine Lax at at 1218 hours by text page via the [facility] messaging system. 2. No arterial atherosclerosis or stenosis in the neck. Mildly irregular appearance of the intracranial arteries diffusely which is nonspecific. No intracranial plaque or hemodynamically significant stenosis identified. 3. No acute findings in the neck and presumed chronic elevation of the left hemidiaphragm responsible for partially visible left lung opacity on the most caudal image. Electronically Signed   By: Genevie Ann M.D.   On: 04/04/2018 12:26   Mr Brain Wo Contrast  Result Date: 04/04/2018 CLINICAL DATA:  Dysarthria.  Left frontal infarct on CT. EXAM: MRI HEAD WITHOUT CONTRAST MRA HEAD WITHOUT CONTRAST TECHNIQUE: Multiplanar, multiecho pulse sequences of the brain and surrounding structures were obtained without intravenous contrast. Angiographic images of the head were obtained using MRA technique without contrast. COMPARISON:  Head CT and CTA 04/04/2018  and MRI 03/10/2017 FINDINGS: MRI HEAD FINDINGS Brain: There is a small acute cortical infarct in the posterior left frontal lobe corresponding to the abnormality on CT. No intracranial hemorrhage, mass, midline shift, or extra-axial fluid collection is identified. Bilateral cerebral white matter T2 hyperintensities, most notable in the centrum semiovale, are similar to the prior MRI with the appearance of multiple chronic lacunar infarcts. The ventricles are normal in size. Vascular: Major intracranial vascular flow voids are preserved. Skull and upper cervical spine: Unremarkable bone marrow signal. Sinuses/Orbits: Unremarkable orbits. Minimal right frontal sinus mucosal thickening. Clear mastoid air cells. Other: None. MRA HEAD FINDINGS The visualized distal vertebral arteries are widely patent to the basilar and codominant. Patent bilateral PICA, right AICA, and bilateral SCA origins are visualized. The basilar artery is widely patent. Both PCAs are patent without evidence of significant proximal stenosis. There is a fetal origin of the right PCA. The internal carotid arteries are widely patent from skull base to carotid termini. ACAs and MCAs are patent without evidence of proximal branch occlusion or significant stenosis. No aneurysm is identified. IMPRESSION: 1. Small acute posterior left frontal lobe infarct (MCA territory). 2. 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 3. Negative head MRA. Electronically Signed   By: Logan Bores M.D.   On: 04/04/2018 16:38   Mr Jodene Nam Head Wo Contrast  Result Date: 04/04/2018 CLINICAL DATA:  Dysarthria.  Left frontal infarct  on CT. EXAM: MRI HEAD WITHOUT CONTRAST MRA HEAD WITHOUT CONTRAST TECHNIQUE: Multiplanar, multiecho pulse sequences of the brain and surrounding structures were obtained without intravenous contrast. Angiographic images of the head were  obtained using MRA technique without contrast. COMPARISON:  Head CT and CTA 04/04/2018 and MRI 03/10/2017 FINDINGS: MRI HEAD FINDINGS Brain: There is a small acute cortical infarct in the posterior left frontal lobe corresponding to the abnormality on CT. No intracranial hemorrhage, mass, midline shift, or extra-axial fluid collection is identified. Bilateral cerebral white matter T2 hyperintensities, most notable in the centrum semiovale, are similar to the prior MRI with the appearance of multiple chronic lacunar infarcts. The ventricles are normal in size. Vascular: Major intracranial vascular flow voids are preserved. Skull and upper cervical spine: Unremarkable bone marrow signal. Sinuses/Orbits: Unremarkable orbits. Minimal right frontal sinus mucosal thickening. Clear mastoid air cells. Other: None. MRA HEAD FINDINGS The visualized distal vertebral arteries are widely patent to the basilar and codominant. Patent bilateral PICA, right AICA, and bilateral SCA origins are visualized. The basilar artery is widely patent. Both PCAs are patent without evidence of significant proximal stenosis. There is a fetal origin of the right PCA. The internal carotid arteries are widely patent from skull base to carotid termini. ACAs and MCAs are patent without evidence of proximal branch occlusion or significant stenosis. No aneurysm is identified. IMPRESSION: 1. Small acute posterior left frontal lobe infarct (MCA territory). 2. 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 3. Negative head MRA. Electronically Signed   By: Logan Bores M.D.   On: 04/04/2018 16:38   Ct Head Code Stroke Wo Contrast  Result Date: 04/04/2018 CLINICAL DATA:  Code stroke. 47 year old female with slurred speech this morning. EXAM: CT HEAD WITHOUT CONTRAST TECHNIQUE: Contiguous axial images were obtained from the base of the skull through  the vertex without intravenous contrast. COMPARISON:  Brain MRI 03/10/2017.  Head CT 09/15/2016. FINDINGS: Brain: Chronic cerebral white matter hypodense foci as demonstrated on the prior studies appear stable since 2018. Small area of new cortical hypodensity in the left middle frontal gyrus (series 3, image 22). No associated hemorrhage or mass effect. No other changes of acute cortical infarct. Stable cerebral volume. No ventriculomegaly. Basilar cisterns remain normal. Vascular: Mild Calcified atherosclerosis at the skull base. No suspicious intracranial vascular hyperdensity. Skull: Stable and negative. Sinuses/Orbits: Visualized paranasal sinuses and mastoids are stable and well pneumatized. Other: Visualized orbits and scalp soft tissues are within normal limits. ASPECTS [facility] Stroke Program Early CT Score) - Ganglionic level infarction (caudate, lentiform nuclei, internal capsule, insula, M1-M3 cortex): 7 - Supraganglionic infarction (M4-M6 cortex): 2 (abnormal M4 segment). Total score (0-10 with 10 being normal): 9 IMPRESSION: 1. Small area of cytotoxic edema in the left MCA M4 segment without hemorrhage or mass effect. ASPECTS is 9. 2. Underlying chronic bilateral cerebral white matter disease. 3. CTA head and neck reported separately. 4. These results were communicated to Dr. Lorraine Lax at 12:11 pmon 10/22/2019by text page via the [facility] messaging system. Electronically Signed   By: Genevie Ann M.D.   On: 04/04/2018 12:11    2D echo Study Conclusions  - Left ventricle: The cavity size was normal. Wall thickness was increased in a pattern of mild LVH. Systolic function was normal. The estimated ejection fraction was in the range of 60% to 65%. Wall motion was normal; there were no regional wall motion abnormalities. - Mitral valve: Valve area  by continuity equation (using LVOT flow): 1.87 cm^2.  TEE on 04/06/2018 Normal LV function; negative saline micro cavitation study  Bilateral  lower extremity duplex negative for DVT  LP done on 04/06/2018 by neurology  Antimicrobials: None   Subjective: Patient seen and examined at bedside.  She feels slightly better although still has some slurring of speech.  No overnight fever, nausea or vomiting.  No worsening weakness  Discharge Exam: Vitals:   04/07/18 0751 04/07/18 1156  BP: (!) 134/98 (!) 150/90  Pulse: 95 85  Resp: 18 18  Temp: 97.7 F (36.5 C) 98.3 F (36.8 C)  SpO2: 99% 100%   Vitals:   04/06/18 2331 04/07/18 0330 04/07/18 0751 04/07/18 1156  BP: 137/80 139/76 (!) 134/98 (!) 150/90  Pulse: 100 98 95 85  Resp: 18 18 18 18   Temp: 97.8 F (36.6 C) 98 F (36.7 C) 97.7 F (36.5 C) 98.3 F (36.8 C)  TempSrc: Oral Oral Oral Oral  SpO2: 99% 98% 99% 100%  Height:        General: Pt is alert, awake, not in acute distress.  Still slightly slurred speech Cardiovascular: rate controlled, S1/S2 + Respiratory: bilateral decreased breath sounds at bases Abdominal: Soft, NT, ND, bowel sounds + Extremities: no edema, no cyanosis    The results of significant diagnostics from this hospitalization (including imaging, microbiology, ancillary and laboratory) are listed below for reference.     Microbiology: Recent Results (from the past 240 hour(s))  CSF culture     Status: None (Preliminary result)   Collection Time: 04/06/18  6:53 PM  Result Value Ref Range Status   Specimen Description CSF  Final   Special Requests NONE  Final   Gram Stain NO WBC SEEN NO ORGANISMS SEEN CYTOSPIN SMEAR   Final   Culture   Final    NO GROWTH < 12 HOURS Performed at [facility], [address] [place], [place] [address]    Report Status PENDING  Incomplete     Labs: BNP (last 3 results) No results for input(s): BNP in the last 8760 hours. Basic Metabolic Panel: Recent Labs  Lab 04/04/18 1108 04/04/18 1128 04/06/18 0423  NA 141 144 139  K 3.9 4.1 3.6  CL 116* 112* 107  CO2 21*  --  26  GLUCOSE 87 84  108*  BUN 10 10 12   CREATININE 0.48 0.40* 0.62  CALCIUM 7.7*  --  9.4  MG  --   --  2.0   Liver Function Tests: Recent Labs  Lab 04/04/18 1108  AST 26  ALT 19  ALKPHOS 76  BILITOT 0.7  PROT 6.2*  ALBUMIN 3.3*   No results for input(s): LIPASE, AMYLASE in the last 168 hours. No results for input(s): AMMONIA in the last 168 hours. CBC: Recent Labs  Lab 04/04/18 1108 04/04/18 1128 04/06/18 0423  WBC 3.3*  --  3.8*  NEUTROABS 1.7  --  1.9  HGB 8.2* 9.5* 9.7*  HCT 28.1* 28.0* 33.4*  MCV 73.0*  --  72.0*  PLT 254  --  287   Cardiac Enzymes: No results for input(s): CKTOTAL, CKMB, CKMBINDEX, TROPONINI in the last 168 hours. BNP: Invalid input(s): POCBNP CBG: No results for input(s): GLUCAP in the last 168 hours. D-Dimer No results for input(s): DDIMER in the last 72 hours. Hgb A1c Recent Labs    04/05/18 0535  HGBA1C 5.9*   Lipid Profile Recent Labs    04/05/18 0535  CHOL 206*  HDL  72  LDLCALC 121*  TRIG 63  CHOLHDL 2.9   Thyroid function studies No results for input(s): TSH, T4TOTAL, T3FREE, THYROIDAB in the last 72 hours.  Invalid input(s): FREET3 Anemia work up No results for input(s): VITAMINB12, FOLATE, FERRITIN, TIBC, IRON, RETICCTPCT in the last 72 hours. Urinalysis    Component Value Date/Time   COLORURINE STRAW (A) 04/04/2018 1258   APPEARANCEUR CLEAR 04/04/2018 1258   LABSPEC 1.025 04/04/2018 1258   PHURINE 6.0 04/04/2018 1258   GLUCOSEU NEGATIVE 04/04/2018 1258   HGBUR NEGATIVE 04/04/2018 1258   BILIRUBINUR NEGATIVE 04/04/2018 1258   BILIRUBINUR small 11/04/2011 1104   KETONESUR NEGATIVE 04/04/2018 1258   PROTEINUR NEGATIVE 04/04/2018 1258   UROBILINOGEN 0.2 12/03/2013 0500   NITRITE NEGATIVE 04/04/2018 1258   LEUKOCYTESUR NEGATIVE 04/04/2018 1258   Sepsis Labs Invalid input(s): PROCALCITONIN,  WBC,  LACTICIDVEN Microbiology Recent Results (from the past 240 hour(s))  CSF culture     Status: None (Preliminary result)   Collection  Time: 04/06/18  6:53 PM  Result Value Ref Range Status   Specimen Description CSF  Final   Special Requests NONE  Final   Gram Stain NO WBC SEEN NO ORGANISMS SEEN CYTOSPIN SMEAR   Final   Culture   Final    NO GROWTH < 12 HOURS Performed at [facility], [place] [address], [place], [place] [address]    Report Status PENDING  Incomplete     Time coordinating discharge: 35 minutes  SIGNED:   Aline August, MD  [facility] 04/07/2018, 12:31 PM Pager: [phone]  If 7PM-7AM, please contact night-coverage [email or web address] Password [email or web address]

## 2018-04-07 NOTE — Progress Notes (Signed)
STROKE TEAM PROGRESS NOTE   SUBJECTIVE (INTERVAL HISTORY) Her boyfriend is at bedside. She is sitting in bed, expressive aphasia continues to improve. Had LP yesterday and no sign of infection or inflammation. Oligoclonal band pending.    OBJECTIVE Temp:  [97.7 F (36.5 C)-98.3 F (36.8 C)] 97.8 F (36.6 C) (10/25 1934) Pulse Rate:  [81-100] 81 (10/25 1934) Cardiac Rhythm: Normal sinus rhythm (10/25 0703) Resp:  [16-18] 18 (10/25 1934) BP: (131-150)/(76-98) 145/87 (10/25 1934) SpO2:  [98 %-100 %] 100 % (10/25 1934) Weight:  [85.6 kg] 85.6 kg (10/25 1530)  No results for input(s): GLUCAP in the last 168 hours. Recent Labs  Lab 04/04/18 1108 04/04/18 1128 04/06/18 0423  NA 141 144 139  K 3.9 4.1 3.6  CL 116* 112* 107  CO2 21*  --  26  GLUCOSE 87 84 108*  BUN 10 10 12   CREATININE 0.48 0.40* 0.62  CALCIUM 7.7*  --  9.4  MG  --   --  2.0   Recent Labs  Lab 04/04/18 1108  AST 26  ALT 19  ALKPHOS 76  BILITOT 0.7  PROT 6.2*  ALBUMIN 3.3*   Recent Labs  Lab 04/04/18 1108 04/04/18 1128 04/06/18 0423  WBC 3.3*  --  3.8*  NEUTROABS 1.7  --  1.9  HGB 8.2* 9.5* 9.7*  HCT 28.1* 28.0* 33.4*  MCV 73.0*  --  72.0*  PLT 254  --  287   No results for input(s): CKTOTAL, CKMB, CKMBINDEX, TROPONINI in the last 168 hours. No results for input(s): LABPROT, INR in the last 72 hours. No results for input(s): COLORURINE, LABSPEC, [place], GLUCOSEU, HGBUR, BILIRUBINUR, KETONESUR, PROTEINUR, UROBILINOGEN, NITRITE, LEUKOCYTESUR in the last 72 hours.  Invalid input(s): APPERANCEUR     Component Value Date/Time   CHOL 206 (H) 04/05/2018 0535   TRIG 63 04/05/2018 0535   HDL 72 04/05/2018 0535   CHOLHDL 2.9 04/05/2018 0535   VLDL 13 04/05/2018 0535   LDLCALC 121 (H) 04/05/2018 0535   Lab Results  Component Value Date   HGBA1C 5.9 (H) 04/05/2018      Component Value Date/Time   LABOPIA NONE DETECTED 04/04/2018 1258   COCAINSCRNUR NONE DETECTED 04/04/2018 1258   LABBENZ NONE  DETECTED 04/04/2018 1258   AMPHETMU NONE DETECTED 04/04/2018 1258   THCU NONE DETECTED 04/04/2018 1258   LABBARB NONE DETECTED 04/04/2018 1258    Recent Labs  Lab 04/04/18 1121  ETH <10    I have personally reviewed the radiological images below and agree with the radiology interpretations.  Ct Angio Head W Or Wo Contrast  Result Date: 04/04/2018 CLINICAL DATA:  Code stroke. 47 year old female with slurred speech this morning. ASPECTS 9 with abnormal left MCA M4 segment on plain head CT. EXAM: CT ANGIOGRAPHY HEAD AND NECK TECHNIQUE: Multidetector CT imaging of the head and neck was performed using the standard protocol during bolus administration of intravenous contrast. Multiplanar CT image reconstructions and MIPs were obtained to evaluate the vascular anatomy. Carotid stenosis measurements (when applicable) are obtained utilizing NASCET criteria, using the distal internal carotid diameter as the denominator. CONTRAST:  40mL ISOVUE-370 IOPAMIDOL (ISOVUE-370) INJECTION 76% COMPARISON:  Head CT 1140 hours today. Chest radiographs 03/29/2017. FINDINGS: CTA NECK Skeleton: No acute osseous abnormality identified. Upper chest: Partially visible left lung density on series 11, image 160 5 May be due to elevated left hemidiaphragm. Otherwise negative visible lung parenchyma. No superior mediastinal lymphadenopathy. Other neck: Negative, no neck mass or lymphadenopathy. Aortic arch: 3 vessel  arch configuration with minimal arch atherosclerosis. Right carotid system: No brachiocephalic artery or right CCA origin plaque or stenosis. Mildly tortuous proximal right CCA. Negative right carotid bifurcation and cervical right ICA. Left carotid system: Normal left CCA origin. Mildly tortuous proximal left CCA. Negative left carotid bifurcation. Negative cervical left ICA. Vertebral arteries: Tortuous proximal right subclavian artery with a mildly kinked appearance but no atherosclerosis. Normal right vertebral  artery origin. Patent right vertebral artery to the skull base without stenosis. Mild proximal left subclavian artery atherosclerosis without stenosis. Normal left vertebral artery origin. Patent left vertebral artery to the skull base without stenosis. CTA HEAD Posterior circulation: Patent distal vertebral arteries, the right is somewhat non dominant B on the patent right PICA origin. Normal left PICA origin. No distal vertebral scratched at there is distal vertebral artery irregularity but no associated stenosis. Patent vertebrobasilar junction and basilar artery with similar mild irregularity but no basilar stenosis. Fetal type right PCA origin. SCA and left PCA origins are within normal limits. Left Posterior communicating artery is diminutive or absent. The bilateral PCA branches are patent and symmetric with mild irregularity. Anterior circulation: Patent left ICA siphon without stenosis. Patent left ICA terminus. Left MCA and ACA origins are normal. Normal left A1 segment. Anterior communicating artery and bilateral ACA branches are within normal limits. Left MCA M1 segment is patent and bifurcates early. Left MCA bifurcation is patent without stenosis. The left MCA M2 branches are patent. No discrete left MCA branch occlusion is identified. Patent right ICA siphon with no plaque or stenosis. Normal right posterior communicating artery origin. Patent right ICA terminus. Normal right MCA and ACA origins. Right MCA M1 segment, right MCA bifurcation, and right MCA M2 branches are patent. The distal right MCA branches appear symmetric to those on the left Venous sinuses: Patent. Anatomic variants: Mildly dominant distal left vertebral artery. Fetal type right PCA origin. Delayed phase: No abnormal enhancement identified. Stable gray-white matter differentiation throughout the brain. Review of the MIP images confirms the above findings IMPRESSION: 1. Negative for large vessel occlusion and NO discrete Left MCA  branch occlusion identified to correspond to the evidence of small anterior left MCA division cortical infarct. This result was communicated to Dr. Lorraine Lax at at 1218 hours by text page via the [facility] messaging system. 2. No arterial atherosclerosis or stenosis in the neck. Mildly irregular appearance of the intracranial arteries diffusely which is nonspecific. No intracranial plaque or hemodynamically significant stenosis identified. 3. No acute findings in the neck and presumed chronic elevation of the left hemidiaphragm responsible for partially visible left lung opacity on the most caudal image. Electronically Signed   By: Genevie Ann M.D.   On: 04/04/2018 12:26   Dg Chest 2 View  Result Date: 04/04/2018 CLINICAL DATA:  Acute stroke, slurred speech, right-sided weakness and numbness EXAM: CHEST - 2 VIEW COMPARISON:  Chest x-ray of 03/29/2017 FINDINGS: There is little change in chronic elevation of the left hemidiaphragm. No pneumonia or effusion is seen. Mediastinal and hilar contours are unremarkable and the heart is within normal limits in size. No bony abnormality is seen. IMPRESSION: No active cardiopulmonary disease. Electronically Signed   By: Ivar Drape M.D.   On: 04/04/2018 15:42   Ct Angio Neck W Or Wo Contrast  Result Date: 04/04/2018 CLINICAL DATA:  Code stroke. 47 year old female with slurred speech this morning. ASPECTS 9 with abnormal left MCA M4 segment on plain head CT. EXAM: CT ANGIOGRAPHY HEAD AND NECK TECHNIQUE: Multidetector CT imaging  of the head and neck was performed using the standard protocol during bolus administration of intravenous contrast. Multiplanar CT image reconstructions and MIPs were obtained to evaluate the vascular anatomy. Carotid stenosis measurements (when applicable) are obtained utilizing NASCET criteria, using the distal internal carotid diameter as the denominator. CONTRAST:  80mL ISOVUE-370 IOPAMIDOL (ISOVUE-370) INJECTION 76% COMPARISON:  Head CT 1140 hours  today. Chest radiographs 03/29/2017. FINDINGS: CTA NECK Skeleton: No acute osseous abnormality identified. Upper chest: Partially visible left lung density on series 11, image 160 5 May be due to elevated left hemidiaphragm. Otherwise negative visible lung parenchyma. No superior mediastinal lymphadenopathy. Other neck: Negative, no neck mass or lymphadenopathy. Aortic arch: 3 vessel arch configuration with minimal arch atherosclerosis. Right carotid system: No brachiocephalic artery or right CCA origin plaque or stenosis. Mildly tortuous proximal right CCA. Negative right carotid bifurcation and cervical right ICA. Left carotid system: Normal left CCA origin. Mildly tortuous proximal left CCA. Negative left carotid bifurcation. Negative cervical left ICA. Vertebral arteries: Tortuous proximal right subclavian artery with a mildly kinked appearance but no atherosclerosis. Normal right vertebral artery origin. Patent right vertebral artery to the skull base without stenosis. Mild proximal left subclavian artery atherosclerosis without stenosis. Normal left vertebral artery origin. Patent left vertebral artery to the skull base without stenosis. CTA HEAD Posterior circulation: Patent distal vertebral arteries, the right is somewhat non dominant B on the patent right PICA origin. Normal left PICA origin. No distal vertebral scratched at there is distal vertebral artery irregularity but no associated stenosis. Patent vertebrobasilar junction and basilar artery with similar mild irregularity but no basilar stenosis. Fetal type right PCA origin. SCA and left PCA origins are within normal limits. Left Posterior communicating artery is diminutive or absent. The bilateral PCA branches are patent and symmetric with mild irregularity. Anterior circulation: Patent left ICA siphon without stenosis. Patent left ICA terminus. Left MCA and ACA origins are normal. Normal left A1 segment. Anterior communicating artery and bilateral  ACA branches are within normal limits. Left MCA M1 segment is patent and bifurcates early. Left MCA bifurcation is patent without stenosis. The left MCA M2 branches are patent. No discrete left MCA branch occlusion is identified. Patent right ICA siphon with no plaque or stenosis. Normal right posterior communicating artery origin. Patent right ICA terminus. Normal right MCA and ACA origins. Right MCA M1 segment, right MCA bifurcation, and right MCA M2 branches are patent. The distal right MCA branches appear symmetric to those on the left Venous sinuses: Patent. Anatomic variants: Mildly dominant distal left vertebral artery. Fetal type right PCA origin. Delayed phase: No abnormal enhancement identified. Stable gray-white matter differentiation throughout the brain. Review of the MIP images confirms the above findings IMPRESSION: 1. Negative for large vessel occlusion and NO discrete Left MCA branch occlusion identified to correspond to the evidence of small anterior left MCA division cortical infarct. This result was communicated to Dr. Lorraine Lax at at 1218 hours by text page via the [employer] messaging system. 2. No arterial atherosclerosis or stenosis in the neck. Mildly irregular appearance of the intracranial arteries diffusely which is nonspecific. No intracranial plaque or hemodynamically significant stenosis identified. 3. No acute findings in the neck and presumed chronic elevation of the left hemidiaphragm responsible for partially visible left lung opacity on the most caudal image. Electronically Signed   By: Genevie Ann M.D.   On: 04/04/2018 12:26   Mr Brain Wo Contrast  Result Date: 04/04/2018 CLINICAL DATA:  Dysarthria.  Left frontal infarct on  CT. EXAM: MRI HEAD WITHOUT CONTRAST MRA HEAD WITHOUT CONTRAST TECHNIQUE: Multiplanar, multiecho pulse sequences of the brain and surrounding structures were obtained without intravenous contrast. Angiographic images of the head were obtained using MRA technique  without contrast. COMPARISON:  Head CT and CTA 04/04/2018 and MRI 03/10/2017 FINDINGS: MRI HEAD FINDINGS Brain: There is a small acute cortical infarct in the posterior left frontal lobe corresponding to the abnormality on CT. No intracranial hemorrhage, mass, midline shift, or extra-axial fluid collection is identified. Bilateral cerebral white matter T2 hyperintensities, most notable in the centrum semiovale, are similar to the prior MRI with the appearance of multiple chronic lacunar infarcts. The ventricles are normal in size. Vascular: Major intracranial vascular flow voids are preserved. Skull and upper cervical spine: Unremarkable bone marrow signal. Sinuses/Orbits: Unremarkable orbits. Minimal right frontal sinus mucosal thickening. Clear mastoid air cells. Other: None. MRA HEAD FINDINGS The visualized distal vertebral arteries are widely patent to the basilar and codominant. Patent bilateral PICA, right AICA, and bilateral SCA origins are visualized. The basilar artery is widely patent. Both PCAs are patent without evidence of significant proximal stenosis. There is a fetal origin of the right PCA. The internal carotid arteries are widely patent from skull base to carotid termini. ACAs and MCAs are patent without evidence of proximal branch occlusion or significant stenosis. No aneurysm is identified. IMPRESSION: 1. Small acute posterior left frontal lobe infarct (MCA territory). 2. 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 3. Negative head MRA. Electronically Signed   By: Logan Bores M.D.   On: 04/04/2018 16:38   Mr Jodene Nam Head Wo Contrast  Result Date: 04/04/2018 CLINICAL DATA:  Dysarthria.  Left frontal infarct on CT. EXAM: MRI HEAD WITHOUT CONTRAST MRA HEAD WITHOUT CONTRAST TECHNIQUE: Multiplanar, multiecho pulse sequences of the brain and surrounding structures were obtained without  intravenous contrast. Angiographic images of the head were obtained using MRA technique without contrast. COMPARISON:  Head CT and CTA 04/04/2018 and MRI 03/10/2017 FINDINGS: MRI HEAD FINDINGS Brain: There is a small acute cortical infarct in the posterior left frontal lobe corresponding to the abnormality on CT. No intracranial hemorrhage, mass, midline shift, or extra-axial fluid collection is identified. Bilateral cerebral white matter T2 hyperintensities, most notable in the centrum semiovale, are similar to the prior MRI with the appearance of multiple chronic lacunar infarcts. The ventricles are normal in size. Vascular: Major intracranial vascular flow voids are preserved. Skull and upper cervical spine: Unremarkable bone marrow signal. Sinuses/Orbits: Unremarkable orbits. Minimal right frontal sinus mucosal thickening. Clear mastoid air cells. Other: None. MRA HEAD FINDINGS The visualized distal vertebral arteries are widely patent to the basilar and codominant. Patent bilateral PICA, right AICA, and bilateral SCA origins are visualized. The basilar artery is widely patent. Both PCAs are patent without evidence of significant proximal stenosis. There is a fetal origin of the right PCA. The internal carotid arteries are widely patent from skull base to carotid termini. ACAs and MCAs are patent without evidence of proximal branch occlusion or significant stenosis. No aneurysm is identified. IMPRESSION: 1. Small acute posterior left frontal lobe infarct (MCA territory). 2. 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 3. Negative head MRA. Electronically Signed   By: Logan Bores M.D.   On: 04/04/2018 16:38   Ct Head Code Stroke Wo Contrast  Result Date: 04/04/2018 CLINICAL DATA:  Code stroke. 47 year old female with slurred speech this morning. EXAM: CT HEAD WITHOUT CONTRAST TECHNIQUE: Contiguous  axial images were obtained from the base of the skull through the vertex without intravenous contrast. COMPARISON:  Brain MRI 03/10/2017.  Head CT 09/15/2016. FINDINGS: Brain: Chronic cerebral white matter hypodense foci as demonstrated on the prior studies appear stable since 2018. Small area of new cortical hypodensity in the left middle frontal gyrus (series 3, image 22). No associated hemorrhage or mass effect. No other changes of acute cortical infarct. Stable cerebral volume. No ventriculomegaly. Basilar cisterns remain normal. Vascular: Mild Calcified atherosclerosis at the skull base. No suspicious intracranial vascular hyperdensity. Skull: Stable and negative. Sinuses/Orbits: Visualized paranasal sinuses and mastoids are stable and well pneumatized. Other: Visualized orbits and scalp soft tissues are within normal limits. ASPECTS [facility] Stroke Program Early CT Score) - Ganglionic level infarction (caudate, lentiform nuclei, internal capsule, insula, M1-M3 cortex): 7 - Supraganglionic infarction (M4-M6 cortex): 2 (abnormal M4 segment). Total score (0-10 with 10 being normal): 9 IMPRESSION: 1. Small area of cytotoxic edema in the left MCA M4 segment without hemorrhage or mass effect. ASPECTS is 9. 2. Underlying chronic bilateral cerebral white matter disease. 3. CTA head and neck reported separately. 4. These results were communicated to Dr. Lorraine Lax at 12:11 pmon 10/22/2019by text page via the [facility] messaging system. Electronically Signed   By: Genevie Ann M.D.   On: 04/04/2018 12:11   LE venous doppler no DVT   PHYSICAL EXAM  Temp:  [97.7 F (36.5 C)-98.3 F (36.8 C)] 97.8 F (36.6 C) (10/25 1934) Pulse Rate:  [81-100] 81 (10/25 1934) Resp:  [16-18] 18 (10/25 1934) BP: (131-150)/(76-98) 145/87 (10/25 1934) SpO2:  [98 %-100 %] 100 % (10/25 1934) Weight:  [85.6 kg] 85.6 kg (10/25 1530)  General - Well nourished, well developed, in no apparent distress.  Ophthalmologic - fundi not visualized due  to noncooperation.  Cardiovascular - Regular rate and rhythm.  Mental Status -  Level of arousal and orientation to time, place, and person were intact. Language including naming, and comprehension was assessed and found intact. However, mild expressive aphasia with hesitation, mild dysarthria and difficulty with repetition.   Cranial Nerves II - XII - II - Visual field intact OU. III, IV, VI - Extraocular movements intact. V - Facial sensation decreased on the right, 70-80% of the left. VII - slight nasolabial fold flattening on the right. VIII - Hearing & vestibular intact bilaterally. X - Palate elevates symmetrically. XI - Chin turning & shoulder shrug intact bilaterally. XII - Tongue protrusion intact.  Motor Strength - The patient's strength was normal in all extremities and pronator drift was absent.  Bulk was normal and fasciculations were absent.   Motor Tone - Muscle tone was assessed at the neck and appendages and was normal.  Reflexes - The patient's reflexes were symmetrical in all extremities and she had no pathological reflexes.  Sensory - Light touch, temperature/pinprick were assessed and were decreased on the right, 70-80% of the left.    Coordination - The patient had normal movements in the hands with no ataxia or dysmetria.  Tremor was absent.  Gait and Station - deferred.   ASSESSMENT/PLAN [name] is a 47 y.o. female with history of seizure disorder, questionable for MS, HTN, HLD, anemia, hx of Mumps admitted for expressive aphasia. No tPA given due to outside window.    Stroke:  left MCA cortical infarct embolic, unclear source  Resultant mild to moderate expressive  aphasia  MRI  Left frontal MCA cortical infarct  CTA head and neck unremarkable  2D Echo EF 60-65%  LE venous doppler neg for DVT  TEE unremarkable, no PFO  Recommend 30 day cardiac event monitoring as outpt to rule out afib  hypercoagulable and autoimmune all  negative  LDL 121  HgbA1c 5.9  UDS neg  HIV and RPR negative  SCDs for VTE prophylaxis  LP no sign of infection or inflammation, oligoclonal band pending  No antithrombotic prior to admission, now on aspirin 81 mg daily and plavix for 3 weeks and then ASA alone.   Patient counseled to be compliant with her antithrombotic medications  Ongoing aggressive stroke risk factor management  Therapy recommendations:  CIR  Disposition:  Pending  ? MS  As per Dr. Tomi Likens "MRI of the brain with and without contrast back on 12/13/00.  Images are not available, but report states "multiple cerebral white matter lesions.  Multiple sclerosis would be the leading consideration.  Other considerations are migraines, vasculitis, and chronic small vessel disease." a lumbar puncture was performed at that time, but CSF analysis was not available  has not had any episodic symptoms to suggest an MS flare and never received IV steroids.   MRI brain, C/T spine in 02/2017 - Multiple foci of cerebral white matter T2 hyperintensity, abnormal for age. This may reflect chronic small vessel ischemia with multiple chronic lacunar infarcts (deep MCA watershed). Demyelinating disease is possible, however there are no lesions specific for multiple sclerosis. No spinal cord lesions  LP was discussed in Dr. Tomi Likens note, but seems not proceeded yet.   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    She was previously on Dilantin and a medication that started with a "Z".    Her maternal grandmother had seizures.   EEG in 02/2017 normal   Not on AEDs at home  Stated no seizure for last 4-5 years except on slight episode 1-2 years ago but can not describe the episode  LP unremarkable   Hypertension . Stable now  Long term BP goal nomortensive  Hyperlipidemia  Home meds:  none   LDL 121, goal < 70  Now on lipitor  20  Continue statin at discharge  Tobacco abuse  Current smoker  Smoking cessation counseling provided  Pt is willing to quit  Other Stroke Risk Factors    Other Active Problems  Leukocytopenia WBC 3.5->3.3->3.8  Anemia  Hb 8.2->9.5->9.7, unspecific  Hospital day # 0  Neurology will sign off. Please call with questions. Pt will follow up with stroke clinic NP at [facility] in about 4 weeks. Thanks for the consult.   Rosalin Hawking, MD PhD Stroke Neurology 04/07/2018 11:08 PM    To contact Stroke Continuity provider, please refer to [email or web address] After hours, contact General Neurology

## 2018-04-07 NOTE — Addendum Note (Signed)
Addended by: Rosalin Hawking on: 04/07/2018 11:18 PM   Modules accepted: Orders

## 2018-04-07 NOTE — Progress Notes (Signed)
Inpatient Rehabilitation-Admissions Coordinator   Spoke to pt and her boyfriend this morning as follow up from conversation yesterday regarding CIR. Pt has stated that due to cost, pt does not want to pursue CIR at this time. Pt's boyfriend plans to ambulate with her this morning to ensure he can care for her at home. AC plans to follow up with them around noon to confirm DC plans for home. AC has communicated tentative plans with CM. Will place note in chart once final decision made.   Jhonnie Garner, OTR/L  Rehab Admissions Coordinator  [phone] 04/07/2018 8:36 AM

## 2018-04-07 NOTE — H&P (Signed)
Physical Medicine and Rehabilitation Admission H&P        Chief Complaint  Patient presents with  . Weakness  . Stroke Symptoms  : HPI: Tina Moss is a 47 year old right-handed female with history of epilepsy, hypertension, hyperlipidemia and seizure disorder in the past and seizure free for 4-5 years with medical noncompliance due to financial issues.  She has seen Dr. Tomi Likens in the past concerning for MS but no LP was done.  Presented 04/04/2018 with right-sided weakness and aphasia.  Per chart review patient lives with boyfriend.  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  One level home.  Cranial CT scan showed small area of cytotoxic edema in the left MCA M4 segment without hemorrhage or mass-effect.  Underlying chronic bilateral cerebral white matter disease.  CT angiogram of head and neck negative for large vessel occlusion.  Patient did not receive TPA.  Urine drug screen negative.  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  Negative head MRA.  Venous Doppler studies lower extremity negative.  Echocardiogram with ejection fraction 65% no wall motion abnormalities.  TEE normal LV function, negative saline micro cavitation study/no PFO.  An LP was completed 04/06/2018 to assess for CNS pathology as she had seen neurology in the past for questionable MS but no work-up was initiated at that time and results remain pending.  Currently on aspirin/Plavix for CVA prophylaxis.  Therapy evaluations completed with recommendations of physical medicine rehab consult.  Patient was admitted for a comprehensive rehab program.   Review of Systems  Constitutional: Negative for chills and fever.  HENT: Negative for hearing loss.   Eyes: Negative for blurred vision and double vision.  Respiratory: Negative for cough and shortness of breath.   Cardiovascular: Negative for chest pain, palpitations and leg swelling.  Gastrointestinal: Positive for  constipation. Negative for nausea and vomiting.  Genitourinary: Negative for dysuria, flank pain and hematuria.  Musculoskeletal: Positive for myalgias.  Skin: Negative for rash.  Neurological: Positive for speech change, focal weakness and seizures.  All other systems reviewed and are negative.       Past Medical History:  Diagnosis Date  . Anemia    . Epilepsy ([place])    . Fibroids    . H/O bacterial infection    . H/O mumps    . Hyperlipidemia    . Hypertension    . Seizures ([place])           Past Surgical History:  Procedure Laterality Date  . CESAREAN SECTION        x2. at term  . TEE WITHOUT CARDIOVERSION N/A 04/06/2018    Procedure: TRANSESOPHAGEAL ECHOCARDIOGRAM (TEE) BUBBLE STUDY;  Surgeon: Lelon Perla, MD;  Location: [facility] ENDOSCOPY;  Service: Cardiovascular;  Laterality: N/A;  . TUBAL LIGATION      . WISDOM TOOTH EXTRACTION             Family History  Problem Relation Age of Onset  . Hypertension Mother    . Hypertension Sister    . Arthritis Sister          knees  . Hypertension Brother    . Deafness Brother    . Speech disorder Brother          mute  . Mental illness Brother          "he can just fly off"  . Mental illness Daughter          ?  bipolar  . Scoliosis Son      Social History:  reports that she has been smoking cigars. She uses smokeless tobacco. She reports that she drinks alcohol. She reports that she has current or past drug history. Drug: Marijuana. Allergies: No Known Allergies       Medications Prior to Admission  Medication Sig Dispense Refill  . OVER THE COUNTER MEDICATION as needed. CR4 weight gain pill      . cyclobenzaprine (FLEXERIL) 10 MG tablet Take 1 tablet (10 mg total) by mouth at bedtime. (Patient not taking: Reported on 04/04/2018) 30 tablet 2  . ferrous sulfate 325 (65 FE) MG tablet Take 1 tablet (325 mg total) by mouth daily with breakfast. 60 tablet 3  . medroxyPROGESTERone (DEPO-PROVERA) 150 MG/ML injection Inject 1  mL (150 mg total) into the muscle every 3 (three) months. (Patient not taking: Reported on 04/04/2018) 1 mL 3  . metoprolol (LOPRESSOR) 100 MG tablet Take 1 tablet (100 mg total) by mouth 2 (two) times daily. (Patient not taking: Reported on 04/04/2018) 60 tablet 3  . venlafaxine XR (EFFEXOR XR) 75 MG 24 hr capsule Take 1 capsule (75 mg total) by mouth daily with breakfast. (Patient not taking: Reported on 04/04/2018) 30 capsule 3      Drug Regimen Review Drug regimen was reviewed and remains appropriate with no significant issues identified   Home: Home Living Family/patient expects to be discharged to:: Private residence Living Arrangements: Spouse/significant other Available Help at Discharge: Family, Other (Comment)(as needed) Type of Home: Apartment Home Access: Stairs to enter [employer] of Steps: flight Entrance Stairs-Rails: Right, Left Home Layout: One level Bathroom Shower/Tub: Chiropodist: Standard Home Equipment: Grab bars - tub/shower   Functional History: Prior Function Level of Independence: Independent Comments: works as a Secretary/administrator, I, drives, runs errands.   Functional Status:  Mobility: Bed Mobility Overal bed mobility: Needs Assistance Bed Mobility: Supine to Sit, Sit to Supine Supine to sit: Min guard Sit to supine: Min assist General bed mobility comments: Min guard and increased time to come to EOB. Min A for LE assist to return to supine.  Transfers Overall transfer level: Needs assistance Equipment used: Rolling walker (2 wheeled) Transfers: Sit to/from Stand, [employer] Transfers Sit to Stand: Min assist, Mod assist Stand pivot transfers: Min assist General transfer comment: Min to mod A for lift assist and steadying. Performed X3 during session. With increased fatigue, pt requiring increased support. Verbal cues for safe hand placement during all transfers, as pt wanting to pull up on RW.   Ambulation/Gait Ambulation/Gait assistance: Min assist, +2 safety/equipment(chair follow ) Gait Distance (Feet): 25 Feet(X2) Assistive device: Rolling walker (2 wheeled) Gait Pattern/deviations: Step-to pattern, Narrow base of support, Decreased dorsiflexion - right General Gait Details: Pt with heavy use of UEs. Narrow BOS noted, especially during turns and required cues for appropriate BOS. Cues for heel strike as well. Required seated rest in between gait trials.  Gait velocity: slow Gait velocity interpretation: <1.31 ft/sec, indicative of household ambulator   ADL: ADL Overall ADL's : Needs assistance/impaired Eating/Feeding: Set up, Sitting Grooming: Minimal assistance, Sitting Upper Body Bathing: Min guard, Minimal assistance, Sitting Lower Body Bathing: Moderate assistance, Sit to/from stand Upper Body Dressing : Minimal assistance, Sitting Lower Body Dressing: Moderate assistance, Sit to/from stand Lower Body Dressing Details (indicate cue type and reason): minA standing balance Toilet Transfer: Minimal assistance, Ambulation, Grab bars, RW Toileting- Clothing Manipulation and Hygiene: Minimal assistance, Sit to/from stand Toileting -  Clothing Manipulation Details (indicate cue type and reason): assist for management of gown/mesh briefs; pt performing peri-care seated on toilet Functional mobility during ADLs: Minimal assistance, Rolling walker   Cognition: Cognition Overall Cognitive Status: Impaired/Different from baseline Arousal/Alertness: Awake/alert Orientation Level: Oriented X4 Attention: Sustained, Selective Sustained Attention: Appears intact Selective Attention: Appears intact Memory: Appears intact(able to recall 5 items slp was bringing her within 5 minutes) Awareness: Appears intact Problem Solving: Appears intact(need to use call bell due to unsteadiness on her feet) Safety/Judgment: Appears intact Cognition Arousal/Alertness: Awake/alert Behavior  During Therapy: WFL for tasks assessed/performed Overall Cognitive Status: Impaired/Different from baseline Area of Impairment: Safety/judgement, Problem solving, Following commands Following Commands: Follows one step commands with increased time Safety/Judgement: Decreased awareness of safety Problem Solving: Slow processing, Requires verbal cues, Requires tactile cues   Physical Exam: Blood pressure 139/76, pulse 98, temperature 98 F (36.7 C), temperature source Oral, resp. rate 18, height _0  (1.702 m), last menstrual period 03/21/2018, SpO2 98 %. Physical Exam  Constitutional: She is oriented to person, place, and time. She appears well-developed and well-nourished.  HENT:  Head: Normocephalic and atraumatic.  Eyes: Pupils are equal, round, and reactive to light. EOM are normal.  Neck: Normal range of motion. Neck supple.  Cardiovascular: Normal rate and regular rhythm.  Respiratory: Effort normal and breath sounds normal.  GI: Soft. Bowel sounds are normal.  Musculoskeletal: Normal range of motion.  Neurological: She is alert and oriented to person, place, and time.  Patient is aphasic.  Patient follows basic commands. Can express basic thoughts in phrases and words. Speech non-fluent. RUE and RLE HP 3+ to 4-/5. LUE and LLE 4+ to 5/5. Marland Kitchen   Skin: Skin is warm and dry.  Psychiatric: She has a normal mood and affect. Her behavior is normal.      Lab Results Last 48 Hours  Results for orders placed or performed during the hospital encounter of 04/04/18 (from the past 48 hour(s))  RPR     Status: None    Collection Time: 04/06/18  4:23 AM  Result Value Ref Range    RPR Ser Ql Non Reactive Non Reactive      Comment: (NOTE) Performed At: [facility] [place], [place] [id number] Rush Farmer MD RJ:[id number]    Sedimentation rate     Status: None    Collection Time: 04/06/18  4:23 AM  Result Value Ref Range    Sed Rate 9 0 - 22 mm/hr      Comment:  Performed at [facility] [address], [place], [place] [address]  C-reactive protein     Status: None    Collection Time: 04/06/18  4:23 AM  Result Value Ref Range    CRP <0.8 <1.0 mg/dL      Comment: Performed at [facility], [place] [address] [place], [place] [address]  CBC with Differential/Platelet     Status: Abnormal    Collection Time: 04/06/18  4:23 AM  Result Value Ref Range    WBC 3.8 (L) 4.0 - 10.5 K/uL    RBC 4.64 3.87 - 5.11 MIL/uL    Hemoglobin 9.7 (L) 12.0 - 15.0 g/dL    HCT 33.4 (L) 36.0 - 46.0 %    MCV 72.0 (L) 80.0 - 100.0 fL    MCH 20.9 (L) 26.0 - 34.0 pg    MCHC 29.0 (L) 30.0 - 36.0 g/dL    RDW 22.9 (H) 11.5 - 15.5 %  Platelets 287 150 - 400 K/uL    nRBC 0.0 0.0 - 0.2 %    Neutrophils Relative % 50 %    Neutro Abs 1.9 1.7 - 7.7 K/uL    Lymphocytes Relative 35 %    Lymphs Abs 1.3 0.7 - 4.0 K/uL    Monocytes Relative 12 %    Monocytes Absolute 0.5 0.1 - 1.0 K/uL    Eosinophils Relative 2 %    Eosinophils Absolute 0.1 0.0 - 0.5 K/uL    Basophils Relative 1 %    Basophils Absolute 0.0 0.0 - 0.1 K/uL    Immature Granulocytes 0 %    Abs Immature Granulocytes 0.01 0.00 - 0.07 K/uL    Polychromasia PRESENT        Comment: Performed at [facility] [address] [place], [place] [address]  Basic metabolic panel     Status: Abnormal    Collection Time: 04/06/18  4:23 AM  Result Value Ref Range    Sodium 139 135 - 145 mmol/L    Potassium 3.6 3.5 - 5.1 mmol/L    Chloride 107 98 - 111 mmol/L    CO2 26 22 - 32 mmol/L    Glucose, Bld 108 (H) 70 - 99 mg/dL    BUN 12 6 - 20 mg/dL    Creatinine, Ser 0.62 0.44 - 1.00 mg/dL    Calcium 9.4 8.9 - 10.3 mg/dL    GFR calc non Af Amer >60 >60 mL/min    GFR calc Af Amer >60 >60 mL/min      Comment: (NOTE) The eGFR has been calculated using the CKD EPI equation. This calculation has not been validated in all clinical situations. eGFR's persistently <60 mL/min signify possible Chronic  Kidney Disease.      Anion gap 6 5 - 15      Comment: Performed at [facility] [address] [place], [place] [address]  Magnesium     Status: None    Collection Time: 04/06/18  4:23 AM  Result Value Ref Range    Magnesium 2.0 1.7 - 2.4 mg/dL      Comment: Performed at [facility] [address] [place], [place] [address]  CSF cell count with differential collection tube #: 2     Status: Abnormal    Collection Time: 04/06/18  6:53 PM  Result Value Ref Range    Tube # 2      Color, CSF COLORLESS COLORLESS    Appearance, CSF CLEAR CLEAR    Supernatant NOT INDICATED      RBC Count, CSF 114 (H) 0 /cu mm    WBC, CSF 1 0 - 5 /cu mm    Lymphs, CSF RARE 40 - 80 %    Other Cells, CSF TOO FEW TO COUNT, SMEAR AVAILABLE FOR REVIEW        Comment: Performed at [facility] [address] [place], [place] [address]  CSF cell count with differential collection tube #: 4     Status: Abnormal    Collection Time: 04/06/18  6:53 PM  Result Value Ref Range    Tube # 4      Color, CSF COLORLESS COLORLESS    Appearance, CSF CLEAR CLEAR    Supernatant NOT INDICATED      RBC Count, CSF 142 (H) 0 /cu mm    WBC, CSF 1 0 - 5 /cu mm    Lymphs, CSF RARE 40 - 80 %  Other Cells, CSF TOO FEW TO COUNT, SMEAR AVAILABLE FOR REVIEW        Comment: Performed at [facility] [address] [place], [place] [address]  CSF culture     Status: None (Preliminary result)    Collection Time: 04/06/18  6:53 PM  Result Value Ref Range    Specimen Description CSF      Special Requests NONE      Gram Stain          NO WBC SEEN NO ORGANISMS SEEN CYTOSPIN SMEAR Performed at [facility], [place] [address], [place], [place] [address]      Culture PENDING      Report Status PENDING    Protein and glucose, CSF     Status: None    Collection Time: 04/06/18  6:53 PM  Result Value Ref Range    Glucose, CSF 68 40 - 70 mg/dL    Total  Protein, CSF 34 15 - 45 mg/dL      Comment:  Performed at [facility] [address], [place], [place] [address]  Cryptococcal antigen, CSF     Status: Abnormal    Collection Time: 04/06/18  6:53 PM  Result Value Ref Range    Crypto Ag NEGATIVE NEGATIVE    Cryptococcal Ag Titer NEGATIVE (A) NOT INDICATED      Comment: Performed at [facility] [address] [place], [place] [address]      Imaging Results (Last 48 hours)  No results found.           Medical Problem List and Plan: 1.  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  DVT Prophylaxis/Anticoagulation: SCDs.  Venous Doppler studies negative 3. Pain Management: Tylenol as needed 4. Mood: Provide emotional support 5. Neuropsych: This patient is capable of making decisions on her own behalf. 6. Skin/Wound Care: Routine skin checks 7. Fluids/Electrolytes/Nutrition: Routine in and outs with follow-up chemistries 8.  Hypertension.  Permissive hypertension.  Patient on Lopressor 100 mg twice daily prior to admission question medical compliance.  Resume as needed 9.  History of epilepsy/seizure disorder as well as work-up for possible MS.  No current seizure medication.  Plan follow-up outpatient neurology services as patient has been seen by Dr. Tomi Likens in the past.  LP results 04/06/2018 pending 10.  Hyperlipidemia.  Lipitor     Post Admission Physician Evaluation: 1. Functional deficits secondary  to left MCA infarct. 2. Patient is admitted to receive collaborative, interdisciplinary care between the physiatrist, rehab nursing staff, and therapy team. 3. Patient's level of medical complexity and substantial therapy needs in context of that medical necessity cannot be provided at a lesser intensity of care such as a SNF. 4. Patient has experienced substantial functional loss from his/her baseline which was documented above under the "Functional History" and "Functional Status" headings.  Judging by  the patient's diagnosis, physical exam, and functional history, the patient has potential for functional progress which will result in measurable gains while on inpatient rehab.  These gains will be of substantial and practical use upon discharge  in facilitating mobility and self-care at the household level. 5. Physiatrist will provide 24 hour management of medical needs as well as oversight of the therapy plan/treatment and provide guidance as appropriate regarding the interaction of the two. 6. The Preadmission Screening has been reviewed  and patient status is unchanged unless otherwise stated above. 7. 24 hour rehab nursing will assist with bladder management, bowel management, safety, skin/wound care, disease management, medication administration and patient education  and help integrate therapy concepts, techniques,education, etc. 8. PT will assess and treat for/with: Lower extremity strength, range of motion, stamina, balance, functional mobility, safety, adaptive techniques and equipment, NMR, family ed.   Goals are: mod I. 9. OT will assess and treat for/with: ADL's, functional mobility, safety, upper extremity strength, adaptive techniques and equipment, NMR, family ed.   Goals are: mod I. Therapy may proceed with showering this patient. 10. SLP will assess and treat for/with: cognition, language.  Goals are: supervision. 11. Case Management and Social Worker will assess and treat for psychological issues and discharge planning. 12. Team conference will be held weekly to assess progress toward goals and to determine barriers to discharge. 13. Patient will receive at least 3 hours of therapy per day at least 5 days per week. 14. ELOS: 10-13 days       15. Prognosis:  excellent     I have personally performed a face to face diagnostic evaluation of this patient and formulated the key components of the plan.  Additionally, I have personally reviewed laboratory data, imaging studies, as well as  relevant notes and concur with the physician assistant's documentation above.   [name], MD, Mellody Drown     Lavon Paganini [place], PA-C 04/07/2018  The patient's status has not changed. The original post admission physician evaluation remains appropriate, and any changes from the pre-admission screening or documentation from the acute chart are noted above.  [name], MD 04/07/2018

## 2018-04-07 NOTE — Progress Notes (Signed)
PMR Admission Coordinator Pre-Admission Assessment  Patient: Tina Moss is an 47 y.o., female MRN: [id number] DOB: 1971/02/24 Height: 5\' 7"  (170.2 cm) Weight:                                                                                                                                                    Insurance Information HMO:     PPO:      PCP:      IPA:      80/20:      OTHER:  PRIMARY: Pt is uninsured; self pay      Policy#:       Subscriber:  CM Name:       Phone#:      Fax#:  Pre-Cert#:       Employer:  Benefits:  Phone #:      Name:  Eff. Date:      Deduct:       Out of Pocket Max:       Life Max:  CIR:       SNF:  Outpatient:      Co-Pay:  Home Health:       Co-Pay:  DME:      Co-Pay:  Providers: Pt is aware of estimated daily cost of care ($3,300). Pt has been assigned a Development worker, community and has been provided that phone number to assist as needed.  SECONDARY:       Policy#:       Subscriber:  CM Name:       Phone#:      Fax#:  Pre-Cert#:       Employer:  Benefits:  Phone #:      Name:  Eff. 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 She has seen Dr. Gwenlyn Found the past concerning for MS but no LP was  done.  Presented 04/04/2018 with right-sided weakness and aphasia. Per chart review patient lives with boyfriend. 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 One level home. Cranial CT scan showed small area of cytotoxic edema in the left MCA M4 segment without hemorrhage or mass-effect. Underlying chronic bilateral cerebral white matter disease. CT angiogram of head and neck negative for large vessel occlusion. Patient did not receive TPA. Urine drug screen negative. 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 Negative head MRA. Venous Doppler studies lower extremity negative. Echocardiogram with ejection fraction 65% no wall motion abnormalities. TEE normal LV function, negative saline micro cavitation study/no PFO. An LP was completed 04/06/2018 to assess for CNS pathology as she had seen neurology in the past for questionable MS but no work-up was initiated at that time and results remain pending. Currently on aspirin/Plavixfor CVA prophylaxis. Therapy evaluations completed with recommendations of physical medicine rehab consult. Patient is to be admitted for a comprehensive rehab program on 04/07/18. Complete NIHSS TOTAL: 3  Past Medical History      Past Medical History:  Diagnosis Date  . Anemia   . Epilepsy ([place])   . Fibroids   . H/O bacterial infection   . H/O mumps   . Hyperlipidemia   . Hypertension   . Seizures ([place])     Family History  family history includes Arthritis in her sister; Deafness in her brother; Hypertension in her brother, mother, and sister; Mental illness in her brother and daughter; Scoliosis in her son; Speech disorder in her brother.  Prior Rehab/Hospitalizations:  Has the patient had major surgery during 100 days prior to admission? No  Current Medications   Current Facility-Administered Medications:  .  acetaminophen (TYLENOL) tablet 650 mg, 650 mg, Oral, Q4H PRN, 650 mg at 04/04/18 1925  **OR** acetaminophen (TYLENOL) solution 650 mg, 650 mg, Per Tube, Q4H PRN **OR** acetaminophen (TYLENOL) suppository 650 mg, 650 mg, Rectal, Q4H PRN, Tina Perla, MD .  aspirin EC tablet 81 mg, 81 mg, Oral, Daily, Tina Perla, MD, 81 mg at 04/06/18 1000 .  atorvastatin (LIPITOR) tablet 20 mg, 20 mg, Oral, q1800, Tina Perla, MD, 20 mg at 04/06/18 1711 .  hydrALAZINE (APRESOLINE) injection 5 mg, 5 mg, Intravenous, Q4H PRN, Tina Perla, MD .  ondansetron (ZOFRAN) tablet 4 mg, 4 mg, Oral, Q6H PRN **OR** ondansetron (ZOFRAN) injection 4 mg, 4 mg, Intravenous, Q6H PRN, Stanford Breed, Denice Bors, MD  Patients Current Diet:     Diet Order                  Diet Heart Room service appropriate? Yes; Fluid consistency: Thin  Diet effective now               Precautions / Restrictions Precautions Precautions: Fall Restrictions Weight Bearing Restrictions: No   Has the patient had 2 or more falls or a fall with injury in the past year?No  Prior Activity Level Community (5-7x/wk): active; worked full time; drove Radiographer, therapeutic / Paramedic Devices/Equipment: None Home Equipment: Grab bars - tub/shower  Prior Device Use: Indicate devices/aids used by the patient prior to current illness, exacerbation or injury? None of the above  Prior Functional Level Prior Function Level of Independence: Independent Comments: works as a Secretary/administrator, I, drives, runs errands.  Self Care: Did the patient need help bathing, dressing, using the toilet or eating?  Independent  Indoor Mobility: Did the patient need assistance with walking  from room to room (with or without device)? Independent  Stairs: Did the patient need assistance with internal or external stairs (with or without device)? Independent  Functional Cognition: Did the patient need help planning regular tasks such as shopping or remembering to take medications?  Independent  Current Functional Level Cognition  Arousal/Alertness: Awake/alert Overall Cognitive Status: Impaired/Different from baseline Orientation Level: Oriented X4 Following Commands: Follows one step commands with increased time Safety/Judgement: Decreased awareness of safety Attention: Sustained, Selective Sustained Attention: Appears intact Selective Attention: Appears intact Memory: Appears intact(able to recall 5 items slp was bringing her within 5 minutes) Awareness: Appears intact Problem Solving: Appears intact(need to use call bell due to unsteadiness on her feet) Safety/Judgment: Appears intact    Extremity Assessment (includes Sensation/Coordination)  Upper Extremity Assessment: Generalized weakness, RUE deficits/detail RUE Deficits / Details: increased weakness noted (R>L); decreased fine motor; will continue to assess RUE Sensation: decreased light touch RUE Coordination: decreased fine motor, decreased gross motor  Lower Extremity Assessment: Defer to PT evaluation RLE Deficits / Details: some isolated movement, but also some synergistic movement.  >3+ overall RLE Sensation: decreased light touch RLE Coordination: decreased fine motor    ADLs  Overall ADL's : Needs assistance/impaired Eating/Feeding: Set up, Sitting Grooming: Minimal assistance, Sitting Upper Body Bathing: Min guard, Minimal assistance, Sitting Lower Body Bathing: Moderate assistance, Sit to/from stand Upper Body Dressing : Minimal assistance, Sitting Lower Body Dressing: Moderate assistance, Sit to/from stand Lower Body Dressing Details (indicate cue type and reason): minA standing balance Toilet Transfer: Minimal assistance, Ambulation, Grab bars, RW Toileting- Clothing Manipulation and Hygiene: Minimal assistance, Sit to/from stand Toileting - Clothing Manipulation Details (indicate cue type and reason): assist for management of gown/mesh briefs; pt performing peri-care seated on  toilet Functional mobility during ADLs: Minimal assistance, Rolling walker    Mobility  Overal bed mobility: Needs Assistance Bed Mobility: Supine to Sit, Sit to Supine Supine to sit: Min guard Sit to supine: Min assist General bed mobility comments: Min guard and increased time to come to EOB. Min A for LE assist to return to supine.     Transfers  Overall transfer level: Needs assistance Equipment used: Rolling walker (2 wheeled) Transfers: Sit to/from Stand, [employer] Transfers Sit to Stand: Min assist, Mod assist Stand pivot transfers: Min assist General transfer comment: Min to mod A for lift assist and steadying. Performed X3 during session. With increased fatigue, pt requiring increased support. Verbal cues for safe hand placement during all transfers, as pt wanting to pull up on RW.     Ambulation / Gait / Stairs / Wheelchair Mobility  Ambulation/Gait Ambulation/Gait assistance: Min assist, +2 safety/equipment(chair follow ) Gait Distance (Feet): 25 Feet(X2) Assistive device: Rolling walker (2 wheeled) Gait Pattern/deviations: Step-to pattern, Narrow base of support, Decreased dorsiflexion - right General Gait Details: Pt with heavy use of UEs. Narrow BOS noted, especially during turns and required cues for appropriate BOS. Cues for heel strike as well. Required seated rest in between gait trials.  Gait velocity: slow Gait velocity interpretation: <1.31 ft/sec, indicative of household ambulator    Posture / Balance Dynamic Sitting Balance Sitting balance - Comments: can accept min/mod challenge with less tolerance accepting challenge right and forward. Balance Overall balance assessment: Needs assistance Sitting-balance support: No upper extremity supported, Feet supported Sitting balance-Leahy Scale: Good Sitting balance - Comments: can accept min/mod challenge with less tolerance accepting challenge right and forward. Standing balance support: Bilateral upper  extremity supported Standing balance-Leahy Scale: Poor Standing  balance comment: reliance on the RW and external support.    Special needs/care consideration BiPAP/CPAP: no CPM: no Continuous Drip IV: no Dialysis: no        Days: no Life Vest: no Oxygen: no Special Bed: no Trach Size: no Wound Vac (area): no      Location: no Skin: no areas of concern                         Bowel mgmt:continent; last BM 04/04/18  Bladder mgmt: continent Diabetic mgmt: no, per pt report     Previous Home Environment Living Arrangements: Spouse/significant other Available Help at Discharge: Family, Other (Comment)(as needed) Type of Home: Apartment Home Layout: One level Home Access: Stairs to enter Entrance Stairs-Rails: Right, Left Entrance Stairs-Number of Steps: flight Bathroom Shower/Tub: Chiropodist: [place]: No  Discharge Living Setting Plans for Discharge Living Setting: Patient's home, Lives with (comment)(boyfriend) Type of Home at Discharge: Apartment Discharge Home Layout: Other (Comment)(2nd floor apartment) Discharge Home Access: Stairs to enter Entrance Stairs-Rails: Right Entrance Stairs-Number of Steps: 10-12 Discharge Bathroom Shower/Tub: Tub/shower unit Discharge Bathroom Toilet: Standard Discharge Bathroom Accessibility: Yes How Accessible: Accessible via walker Does the patient have any problems obtaining your medications?: No  Social/Family/Support Systems Patient Roles: Partner, Parent Contact Information: emergency contact; Mother Lelan Moss) [phone]; Boyfriend Karma Greaser): [phone] Anticipated Caregiver: boyfriend; daughter can assist as needed Anticipated Caregiver's Contact Information: see above Ability/Limitations of Caregiver: aniticipate intermittant Min A Caregiver Availability: Intermittent Discharge Plan Discussed with Primary Caregiver: Yes Is Caregiver In Agreement with Plan?: Yes Does  Caregiver/Family have Issues with Lodging/Transportation while Pt is in Rehab?: No   Goals/Additional Needs Patient/Family Goal for Rehab: PT/OT: Mod I; SLP: Supervision Expected length of stay: 13-17 days Cultural Considerations: NA Dietary Needs: heart healthy, thin liquids Equipment Needs: TBD Pt/Family Agrees to Admission and willing to participate: Yes Program Orientation Provided & Reviewed with Pt/Caregiver Including Roles  & Responsibilities: Yes(with pt and boyfriend)  Barriers to Discharge: Home environment access/layout, Lack of/limited family support  Barriers to Discharge Comments: 10-12 steps to enter; intermittant assist   Decrease burden of Care through IP rehab admission: NA   Possible need for SNF placement upon discharge: not anticipated; pt has good social support at home and has good prognosis for further progress.    Patient Condition: This patient's medical and functional status has changed since the consult dated: 04/06/18 in which the Rehabilitation Physician determined and documented that the patient's condition is appropriate for intensive rehabilitative care in an inpatient rehabilitation facility. Medical changes are: completion of lumbar puncture.  Functional changes are: Min A transfers and Min A x2 140 feet. After evaluating the patient today and speaking with the Rehabilitation physician and acute team, the patient remains appropriate for inpatient rehab. Will admit to inpatient rehab today.  Preadmission Screen Completed By:  Jhonnie Garner, 04/06/2018 5:40 PM ______________________________________________________________________   Discussed status with Dr. Naaman Plummer on 04/07/18 at 12:33PM and received telephone approval for admission today.  Admission Coordinator:  Ashlynne Shetterly, time 12:33PM/Date 04/07/18           Cosigned by: [name], MD at 04/07/2018 1:19 PM  Revision History

## 2018-04-07 NOTE — Progress Notes (Signed)
Physical Therapy Treatment Patient Details Name: Tina Moss MRN: [id number] DOB: December 24, 1970 Today's Date: 04/07/2018    History of Present Illness pt is a 47 y/o female with pmh significant for epilepsy, HTN, HLD, tobacco use, admitted to ED due to having slurred speech and right sided hemiparesis.  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    PT Comments    Pt performed gait and stair training.  She currently requires min to moderate assistance and still benefits from intense aggressive therapy to return home in a more independent functional state.  Expressed concerns for returning home to soon.  B/f and patient to discuss their options.  If patient does not go to rehab she will require HHPT, RW and BSC at d/c home.    Follow Up Recommendations  CIR;Supervision/Assistance - 24 hour     Equipment Recommendations  Rolling walker with 5" wheels    Recommendations for Other Services Rehab consult     Precautions / Restrictions Precautions Precautions: Fall Restrictions Weight Bearing Restrictions: No    Mobility  Bed Mobility Overal bed mobility: Needs Assistance Bed Mobility: Supine to Sit;Sit to Supine     Supine to sit: Min guard;HOB elevated     General bed mobility comments: Min guard and increased time to come to EOB.  Transfers Overall transfer level: Needs assistance Equipment used: Rolling walker (2 wheeled) Transfers: Sit to/from Stand Sit to Stand: Min assist Stand pivot transfers: Min assist       General transfer comment: Min assistance to boost into standing.  Pt required cues for hand placement, forward weightshifting and pushing with B LEs to come to standing.    Ambulation/Gait Ambulation/Gait assistance: Min assist;+2 safety/equipment(chair follow) Gait Distance (Feet): 140 Feet Assistive device: Rolling walker (2 wheeled) Gait Pattern/deviations: Narrow base of support;Decreased dorsiflexion -  right;Step-through pattern Gait velocity: slow   General Gait Details: Pt remains to present with heavy use of UEs.  Pt require elevation of RW height to improve fit.  Pt is slow and guarded.  Cues for R foot clearance and dorsiflexion.  Cues for loading weight into RLE.     Stairs Stairs: Yes Stairs assistance: Mod assist Stair Management: One rail Right;Forwards;Sideways Number of Stairs: 10 General stair comments: x10 forwards with R rail and L HHA to ascend.  Pt required cues for sequencing.  To descend she performed sideways with B hand placement on railing.  Educated bf on where to guard patient during stair negotiation.     Wheelchair Mobility    Modified Rankin (Stroke Patients Only)       Balance Overall balance assessment: Needs assistance Sitting-balance support: No upper extremity supported;Feet supported Sitting balance-Leahy Scale: Good       Standing balance-Leahy Scale: Poor Standing balance comment: reliance on the RW and external support.                            Cognition Arousal/Alertness: Awake/alert Behavior During Therapy: WFL for tasks assessed/performed Overall Cognitive Status: Impaired/Different from baseline Area of Impairment: Safety/judgement;Problem solving;Following commands                       Following Commands: Follows one step commands with increased time Safety/Judgement: Decreased awareness of safety   Problem Solving: Slow processing;Requires verbal cues;Requires tactile cues General Comments: Pt remains unrealistic in her abilities to return home.  Exercises      General Comments        Pertinent Vitals/Pain Pain Assessment: Faces Faces Pain Scale: Hurts little more Pain Location: back Pain Descriptors / Indicators: Discomfort;Sore Pain Intervention(s): Monitored during session;Repositioned    Home Living                      Prior Function            PT Goals (current  goals can now be found in the care plan section) Acute Rehab PT Goals Patient Stated Goal: back to work Potential to Achieve Goals: Good Progress towards PT goals: Progressing toward goals    Frequency    Min 4X/week      PT Plan Current plan remains appropriate    Co-evaluation              AM-PAC PT "6 Clicks" Daily Activity  Outcome Measure  Difficulty turning over in bed (including adjusting bedclothes, sheets and blankets)?: A Little Difficulty moving from lying on back to sitting on the side of the bed? : Unable Difficulty sitting down on and standing up from a chair with arms (e.g., wheelchair, bedside commode, etc,.)?: Unable Help needed moving to and from a bed to chair (including a wheelchair)?: A Little Help needed walking in hospital room?: A Little Help needed climbing 3-5 steps with a railing? : A Lot 6 Click Score: 13    End of Session Equipment Utilized During Treatment: Gait belt Activity Tolerance: Patient tolerated treatment well Patient left: with call bell/phone within reach;Other (comment);in chair;with family/visitor present(encourage patient to remain OOB.  ) Nurse Communication: Mobility status PT Visit Diagnosis: Unsteadiness on feet (R26.81);Other abnormalities of gait and mobility (R26.89);Hemiplegia and hemiparesis Hemiplegia - Right/Left: Right Hemiplegia - caused by: Cerebral infarction     Time: 9169-4503 PT Time Calculation (min) (ACUTE ONLY): 30 min  Charges:  $Gait Training: 8-22 mins $Therapeutic Activity: 8-22 mins                     Governor Rooks, PTA Acute Rehabilitation Services Pager [phone] Office [phone]     Ketara Cavness Eli Hose 04/07/2018, 11:12 AM

## 2018-04-07 NOTE — Progress Notes (Signed)
Patient ID: Tina Moss, female   DOB: 02-22-1971, 47 y.o.   MRN: [id number] Admit to unit,oriented to plan of care, routine and rehab schedule. States an understanding of information reviewed. Margarito Liner

## 2018-04-08 ENCOUNTER — Inpatient Hospital Stay (HOSPITAL_COMMUNITY): Payer: Self-pay | Admitting: Speech Pathology

## 2018-04-08 ENCOUNTER — Other Ambulatory Visit: Payer: Self-pay

## 2018-04-08 ENCOUNTER — Encounter (HOSPITAL_COMMUNITY): Payer: Self-pay

## 2018-04-08 ENCOUNTER — Inpatient Hospital Stay (HOSPITAL_COMMUNITY): Payer: Self-pay | Admitting: Occupational Therapy

## 2018-04-08 ENCOUNTER — Inpatient Hospital Stay (HOSPITAL_COMMUNITY): Payer: Self-pay | Admitting: Physical Therapy

## 2018-04-08 NOTE — Evaluation (Signed)
Speech Language Pathology Assessment and Plan  Patient Details  Name: LILYANN GRAVELLE MRN: [id number] Date of Birth: 1971/02/21  SLP Diagnosis: Aphasia  Rehab Potential: Excellent ELOS: 7 to 10 days    Today's Date: 04/08/2018 SLP Individual Time: 0700-0800 SLP Individual Time Calculation (min): 60 min   Problem List:  Patient Active Problem List   Diagnosis Date Noted  . 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 Acute CVA (cerebrovascular accident) ([place]) 04/04/2018  . Tobacco use 04/04/2018  . Heart palpitations 12/14/2016  . Intermittent chest pain 12/14/2016  . LVH (left ventricular hypertrophy) 12/14/2016  . Depression 11/15/2016  . Essential hypertension 11/15/2016  . MS (multiple sclerosis) ([place]) 11/21/2015  . Fibroids 01/18/2012  . Menorrhagia 01/18/2012  . Seizure disorder ([place]) 01/18/2012  . Anemia 01/18/2012   Past Medical History:  Past Medical History:  Diagnosis Date  . Anemia   . Epilepsy ([place])   . Fibroids   . H/O bacterial infection   . H/O mumps   . Hyperlipidemia   . Hypertension   . Seizures ([place])    Past Surgical History:  Past Surgical History:  Procedure Laterality Date  . CESAREAN SECTION     x2. at term  . TEE WITHOUT CARDIOVERSION N/A 04/06/2018   Procedure: TRANSESOPHAGEAL ECHOCARDIOGRAM (TEE) BUBBLE STUDY;  Surgeon: Lelon Perla, MD;  Location: [facility] ENDOSCOPY;  Service: Cardiovascular;  Laterality: N/A;  . TUBAL LIGATION    . WISDOM TOOTH EXTRACTION      Assessment / Plan / Recommendation Clinical Impression Tatiyana Foucher is a 47 year old right-handed female with history of epilepsy, hypertension, hyperlipidemia and seizure disorder in the past and seizure free for 4-5 years with medical noncompliance due to financial issues.  She has seen Dr. Tomi Likens in the past concerning for MS but no LP was done.  Presented 04/04/2018 with right-sided weakness and aphasia.  Cranial CT scan showed small area of cytotoxic edema in  the left MCA M4 segment without hemorrhage or mass-effect.  Underlying chronic bilateral cerebral white matter disease.  Urine drug screen negative.  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  An LP was completed 04/06/2018 to assess for CNS pathology as she had seen neurology in the past for questionable MS but no work-up was initiated at that time and results remain pending. Therapy evaluations completed with recommendations of physical medicine rehab consult.    Patient was admitted for a comprehensive rehab program on 04/07/18. Cognitive linguistic evaluation completed on 04/08/18. Pt presents with mild expressive aphasia c/b difficulty with word finding when conveying mildly complex informaiton. Pt is able to convey basic wants and needs functionally. Pt's overall language is mildly nonfluent d/t to word finding deficits. Pt is self-aware and is able to self-correct and retrieve words when time is allowed. Education provided for pt to attempt communication instead of looking to husband to speak for her. Both were in agreement. No groping or apraxia witnessed. Semantic and sentence completion cues helpful during moments of retrieval difficulty. Pt's receptive abilites and cognition appear intact as pt is able to follow multi-step directions, answer complex yes/no questions, is oriented and demonstrates good awareness.    Skilled Therapeutic Interventions          Skilled treatment session focused on completion of cognitive linguistic evaluation, see above. Education also provided on POC, stroke recovery and prognosis. All questions answered to pt and husband's satisfaction.    SLP Assessment  Patient will need skilled Speech Lanaguage  Pathology Services during CIR admission    Recommendations  Patient destination: Home Follow up Recommendations: Outpatient SLP Equipment Recommended: None recommended by SLP    SLP Frequency 3 to 5 out of 7 days   SLP Duration  SLP  Intensity  SLP Treatment/Interventions 7 to 10 days  Minumum of 1-2 x/day, 30 to 90 minutes  Speech/Language facilitation;Patient/family education;Functional tasks    Pain Pain Assessment Pain Scale: 0-10 Pain Score: 0-No pain  Prior Functioning Cognitive/Linguistic Baseline: Within functional limits Type of Home: Apartment  Lives With: Significant other Available Help at Discharge: Family Education: 11th Vocation: Full time employment  Short Term Goals: Week 1: SLP Short Term Goal 1 (Week 1): Pt will utilization word finding strategies to convey semi-complex information with supervision cues.  SLP Short Term Goal 2 (Week 1): Pt will self-correct word finding errors with supervision cues.   Refer to Care Plan for Long Term Goals  Recommendations for other services: None   Discharge Criteria: Patient will be discharged from SLP if patient refuses treatment 3 consecutive times without medical reason, if treatment goals not met, if there is a change in medical status, if patient makes no progress towards goals or if patient is discharged from hospital.  The above assessment, treatment plan, treatment alternatives and goals were discussed and mutually agreed upon: by patient and by family  Dewarren Ledbetter 04/08/2018, 12:02 PM

## 2018-04-08 NOTE — Progress Notes (Signed)
[place] PHYSICAL MEDICINE & REHABILITATION PROGRESS NOTE   Subjective/Complaints: Pt and husband upset about room and service of staff. Contemplating leaving when I rounded earlier. Nurse team, CNA helped to improve circumstances and pt/husband doing better this afternoon  ROS: Patient denies fever, rash, sore throat, blurred vision, nausea, vomiting, diarrhea, cough, shortness of breath or chest pain, joint or back pain, headache, or mood change.     Objective:   No results found. Recent Labs    04/06/18 0423  WBC 3.8*  HGB 9.7*  HCT 33.4*  PLT 287   Recent Labs    04/06/18 0423  NA 139  K 3.6  CL 107  CO2 26  GLUCOSE 108*  BUN 12  CREATININE 0.62  CALCIUM 9.4    Intake/Output Summary (Last 24 hours) at 04/08/2018 1235 Last data filed at 04/07/2018 1700 Gross per 24 hour  Intake 240 ml  Output -  Net 240 ml     Physical Exam: Vital Signs Blood pressure 131/87, pulse 92, temperature 97.6 F (36.4 C), resp. rate 16, height 5\' 7"  (1.702 m), weight 85.6 kg, last menstrual period 03/21/2018, SpO2 100 %. Constitutional: No distress . Vital signs reviewed. HEENT: EOMI, oral membranes moist Neck: supple Cardiovascular: RRR without murmur. No JVD    Respiratory: CTA Bilaterally without wheezes or rales. Normal effort    GI: BS +, non-tender, non-distended  Musculoskeletal:Normal range of motion.  Neurological: She isalertand oriented to person, place, and time. Patient is expressively aphasic/apraxic. Marland Kitchen Speech non-fluent. RUE and RLE HP   4-/5 with apraxia. LUE and LLE 4+ to 5/5. Marland Kitchen  Skin: Skin iswarmand dry.  Psychiatric: She has anormal mood and affect. Herbehavior is normal    Assessment/Plan: 1. 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  Physiatrist is providing close team supervision and 24 hour management of active medical problems listed  below.  Physiatrist and rehab team continue to assess barriers to discharge/monitor patient progress toward functional and medical goals  Care Tool:  Bathing    Body parts bathed by patient: Right arm, Left arm, Chest, Abdomen   Body parts bathed by helper: (set up stuff needed to wash up in the bedside per pt request)     Bathing assist Assist Level: Set up assist     Upper Body Dressing/Undressing Upper body dressing   What is the patient wearing?: Pull over shirt    Upper body assist Assist Level: Independent    Lower Body Dressing/Undressing Lower body dressing      What is the patient wearing?: Pants     Lower body assist Assist for lower body dressing: Independent     Toileting Toileting    Toileting assist Assist for toileting: Contact Guard/Touching assist     Transfers Chair/bed transfer  Transfers assist     Chair/bed transfer assist level: Minimal Assistance - Patient > 75%     Locomotion Ambulation   Ambulation assist      Assist level: Minimal Assistance - Patient > 75% Assistive device: Walker-rolling Max distance: 125'   Walk 10 feet activity   Assist     Assist level: Minimal Assistance - Patient > 75% Assistive device: Walker-rolling   Walk 50 feet activity   Assist    Assist level: Minimal Assistance - Patient > 75% Assistive device: Walker-rolling    Walk 150 feet activity   Assist Walk 150 feet activity did not occur:  Safety/medical concerns         Walk 10 feet on uneven surface  activity   Assist Walk 10 feet on uneven surfaces activity did not occur: Safety/medical concerns         Wheelchair     Assist Will patient use wheelchair at discharge?: No(anticipate pt will be primary ambulator at d/c)   Wheelchair activity did not occur: N/A         Wheelchair 50 feet with 2 turns activity    Assist    Wheelchair 50 feet with 2 turns activity did not occur: N/A       Wheelchair  150 feet activity     Assist Wheelchair 150 feet activity did not occur: N/A          Medical Problem List and Plan: 1.Right side weakness and aphasiasecondary to left MCA infarction -beginning therapies today  -continued pt/family ed  -short stay 2. DVT Prophylaxis/Anticoagulation: SCDs. Venous Doppler studies negative 3. Pain Management:Tylenol as needed 4. Mood:Provide emotional support 5. Neuropsych: This patientiscapable of making decisions on herown behalf. 6. Skin/Wound Care:Routine skin checks 7. Fluids/Electrolytes/Nutrition:Routine in and outs with follow-up chemistries 8.Hypertension. Permissive hypertension. Patient on Lopressor 100 mg twice daily prior to admission question medical compliance.    -no changes at present 9.History of epilepsy/seizure disorder as well as work-up for possible MS. No current seizure medication. Plan follow-up outpatient neurology services as patient has been seen by Dr. Tomi Likens in the past. LP results 04/06/2018 unremarkable 10.Hyperlipidemia. Lipitor  LOS: 1 days A FACE TO FACE EVALUATION WAS PERFORMED  [name] 04/08/2018, 12:35 PM

## 2018-04-08 NOTE — Evaluation (Signed)
Occupational Therapy Assessment and Plan  Patient Details  Name: Tina Moss MRN: [id number] Date of Birth: 01/25/1971  OT Diagnosis: hemiplegia affecting dominant side and muscle weakness (generalized) Rehab Potential: Rehab Potential (ACUTE ONLY): Excellent ELOS: 10-12 days   Today's Date: 04/08/2018 OT Individual Time: 1300-1416 OT Individual Time Calculation (min): 76 min     Problem List:  Patient Active Problem List   Diagnosis Date Noted  . 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 Acute CVA (cerebrovascular accident) ([place]) 04/04/2018  . Tobacco use 04/04/2018  . Heart palpitations 12/14/2016  . Intermittent chest pain 12/14/2016  . LVH (left ventricular hypertrophy) 12/14/2016  . Depression 11/15/2016  . Essential hypertension 11/15/2016  . MS (multiple sclerosis) ([place]) 11/21/2015  . Fibroids 01/18/2012  . Menorrhagia 01/18/2012  . Seizure disorder ([place]) 01/18/2012  . Anemia 01/18/2012    Past Medical History:  Past Medical History:  Diagnosis Date  . Anemia   . Epilepsy ([place])   . Fibroids   . H/O bacterial infection   . H/O mumps   . Hyperlipidemia   . Hypertension   . Seizures ([place])    Past Surgical History:  Past Surgical History:  Procedure Laterality Date  . CESAREAN SECTION     x2. at term  . TEE WITHOUT CARDIOVERSION N/A 04/06/2018   Procedure: TRANSESOPHAGEAL ECHOCARDIOGRAM (TEE) BUBBLE STUDY;  Surgeon: Lelon Perla, MD;  Location: [facility] ENDOSCOPY;  Service: Cardiovascular;  Laterality: N/A;  . TUBAL LIGATION    . WISDOM TOOTH EXTRACTION      Assessment & Plan Clinical Impression: Carling Liberman is a 47 year old right-handed female with history of epilepsy, hypertension, hyperlipidemia and seizure disorder in the past and seizure free for 4-5 years with medical noncompliance due to financial issues. She has seen Dr. Gwenlyn Found the past concerning for MS but no LP was done. Presented 04/04/2018 with right-sided weakness and  aphasia. Per chart review patient lives with boyfriend. 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 One level home. Cranial CT scan showed small area of cytotoxic edema in the left MCA M4 segment without hemorrhage or mass-effect. Underlying chronic bilateral cerebral white matter disease. CT angiogram of head and neck negative for large vessel occlusion. Patient did not receive TPA. Urine drug screen negative. 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 Negative head MRA. Venous Doppler studies lower extremity negative. Echocardiogram with ejection fraction 65% no wall motion abnormalities. TEE normal LV function, negative saline micro cavitation study/no PFO. An LP was completed 04/06/2018 to assess for CNS pathology as she had seen neurology in the past for questionable MS but no work-up was initiated at that time and results remain pending. Currently on aspirin/Plavixfor CVA prophylaxis. Therapy evaluations completed with recommendations of physical medicine rehab consult. Patient was admitted for a comprehensive rehab program.  Patient currently requires min with basic self-care skills and IADL secondary to muscle weakness, decreased cardiorespiratoy endurance, unbalanced muscle activation and decreased coordination and decreased standing balance and hemiplegia.  Prior to hospitalization, patient could complete BADLs with independent .  Patient will benefit from skilled intervention to increase independence with basic self-care skills prior to discharge home with boyfriend.  Anticipate patient will require intermittent supervision and follow up home health.  OT - End of Session Endurance Deficit: Yes OT Assessment Rehab Potential (ACUTE ONLY): Excellent OT Barriers to Discharge: Decreased caregiver support OT Barriers to Discharge Comments: Boyfriend works 8-5 every day OT Patient demonstrates impairments in the following area(s):  Balance;Safety;Endurance;Motor OT Basic ADL's Functional Problem(s): Grooming;Bathing;Dressing;Toileting OT Advanced ADL's Functional Problem(s): Simple Meal Preparation OT Transfers Functional Problem(s): Toilet;Tub/Shower OT Additional Impairment(s): None OT Plan OT Intensity: Minimum of 1-2 x/day, 45 to 90 minutes OT Frequency: 5 out of 7 days OT Duration/Estimated Length of Stay: 10-12 days OT Treatment/Interventions: Balance/vestibular training;Community reintegration;Disease mangement/prevention;Neuromuscular re-education;Patient/family education;Self Care/advanced ADL retraining;Therapeutic Exercise;UE/LE Coordination activities;Wheelchair propulsion/positioning;Visual/perceptual remediation/compensation;UE/LE Strength taining/ROM;Therapeutic Activities;Skin care/wound managment;Psychosocial support;Pain management;Functional mobility training;DME/adaptive equipment instruction;Discharge planning;Cognitive remediation/compensation OT Self Feeding Anticipated Outcome(s): No goal OT Basic Self-Care Anticipated Outcome(s): Supervision/setup-Mod I  OT Toileting Anticipated Outcome(s): Mod I  OT Bathroom Transfers Anticipated Outcome(s): Supervision/setup-Mod I  OT Recommendation Recommendations for Other Services: Therapeutic Recreation consult Therapeutic Recreation Interventions: Pet therapy;Stress management Patient destination: Home Follow Up Recommendations: Home health OT Equipment Recommended: To be determined   Skilled Therapeutic Intervention Pt greeted in bed with no c/o pain. Skilled OT session completed with focus on initial evaluation, education on OT role/POC, and establishment of patient-centered goals. All functional transfers completed using RW at ambulatory level with steady assist. Pt bathed sit<stand in shower, able to use R UE at dominant level. Reported tingling in fingers, however sensation appeared intact with assessment. She proceeded to dress from EOB sit<stand  using RW. Assist required for elevating pants over hips due to decreased standing endurance. She was able to utilize figure 4 position for donning footwear. At end of session pt returned to bed and was left with all needs within reach and bed alarm set.   OT Evaluation Precautions/Restrictions  Precautions Precautions: Fall Restrictions Weight Bearing Restrictions: No General Chart Reviewed: Yes Family/Caregiver Present: No Vital Signs Therapy Vitals Temp: (!) 97.5 F (36.4 C) Pulse Rate: 92 Resp: 16 BP: 135/86 Patient Position (if appropriate): Lying Oxygen Therapy SpO2: 100 % O2 Device: Room Air Pain No c/o pain during session    Home Living/Prior [place] expects to be discharged to:: Private residence Living Arrangements: Spouse/significant other Available Help at Discharge: Family Type of Home: Apartment Home Access: Stairs to enter Technical brewer of Steps: 10-12 Entrance Stairs-Rails: Right, Left(cannot reach both) Home Layout: One level Bathroom Shower/Tub: Optometrist: Yes  Lives With: Significant other IADL History Homemaking Responsibilities: Yes(boyfriend assists with most IADL tasks) Meal Prep Responsibility: Primary Laundry Responsibility: Secondary Bill Paying/Finance Responsibility: Secondary Shopping Responsibility: Secondary Child Care Responsibility: No Occupation: Full time employment Type of Occupation: Secretary/administrator, Microbiologist multiple houses Leisure and Hobbies: Playing pool Prior Function Level of Independence: Independent with basic ADLs, Independent with homemaking with ambulation, Independent with gait  Able to Take Stairs?: Yes Driving: Yes ADL ADL Eating: Not assessed Grooming: Not assessed Upper Body Bathing: Supervision/safety Where Assessed-Upper Body Bathing: Shower Lower Body Bathing: Minimal assistance Where Assessed-Lower Body Bathing:  Shower Upper Body Dressing: Supervision/safety Where Assessed-Upper Body Dressing: Edge of bed Lower Body Dressing: Minimal assistance Where Assessed-Lower Body Dressing: Edge of bed Toileting: Not assessed Toilet Transfer: Not assessed Social research officer, government: Minimal assistance Social research officer, government Method: Radiographer, therapeutic: Shower seat with back, Grab bars Vision Baseline Vision/History: Wears glasses Wears Glasses: Reading only Patient Visual Report: No change from baseline Vision Assessment?: No apparent visual deficits Perception  Perception: Within Functional Limits Praxis Praxis: Intact Cognition Overall Cognitive Status: Within Functional Limits for tasks assessed Arousal/Alertness: Awake/alert Orientation Level: Person;Place;Situation Place: Oriented Situation: Oriented Year: 2019 Day of Week: Correct Memory: Appears intact Immediate Memory Recall: Sock;Blue;Bed Memory Recall: Sock;Blue;Bed Memory Recall Sock: Without Cue Memory Recall Blue:  Without Cue Memory Recall Bed: Without Cue Sustained Attention: Appears intact Selective Attention: Appears intact Awareness: Appears intact Problem Solving: Appears intact Safety/Judgment: Appears intact Sensation Sensation Light Touch: Appears Intact(Reports tingling in Rt digits, however sensation intact B UEs with gross assessment) Coordination Gross Motor Movements are Fluid and Coordinated: No Fine Motor Movements are Fluid and Coordinated: Yes(able to open plastic packages of ADL items) Coordination and Movement Description: Mild Rt hemi Finger Nose Finger Test: Dysmetria R UE Motor  Motor Motor: Hemiplegia Motor - Skilled Clinical Observations: Mild R hemi  Mobility     Trunk/Postural Assessment  Cervical Assessment Cervical Assessment: Within Functional Limits Thoracic Assessment Thoracic Assessment: Within Functional Limits Lumbar Assessment Lumbar Assessment: Within Functional  Limits Postural Control Postural Control: Within Functional Limits  Balance Balance Balance Assessed: Yes Dynamic Sitting Balance Dynamic Sitting - Balance Support: No upper extremity supported;Feet supported Dynamic Sitting - Level of Assistance: 5: Stand by assistance(washing Lt LE in shower) Dynamic Standing Balance Dynamic Standing - Balance Support: No upper extremity supported;During functional activity Dynamic Standing - Level of Assistance: 4: Min assist Dynamic Standing - Balance Activities: Lateral lean/weight shifting;Forward lean/weight shifting(LB dressing) Extremity/Trunk Assessment RUE Assessment RUE Assessment: Exceptions to [facility] RUE Body System: Neuro RUE Strength Right Shoulder Flexion: 3+/5 Right Elbow Flexion: 4-/5 Right Elbow Extension: 4/5 Right Hand Gross Grasp: Functional LUE Assessment LUE Assessment: Within Functional Limits(4+/5 proximal to distal)     Refer to Care Plan for Long Term Goals  Recommendations for other services: Therapeutic Recreation  Pet therapy, Stress management and Outing/community reintegration   Discharge Criteria: Patient will be discharged from OT if patient refuses treatment 3 consecutive times without medical reason, if treatment goals not met, if there is a change in medical status, if patient makes no progress towards goals or if patient is discharged from hospital.  The above assessment, treatment plan, treatment alternatives and goals were discussed and mutually agreed upon: by patient  Skeet Simmer 04/08/2018, 4:31 PM

## 2018-04-08 NOTE — Evaluation (Signed)
Physical Therapy Assessment and Plan  Patient Details  Name: Tina Moss MRN: [id number] Date of Birth: [birth year]  PT Diagnosis: Abnormality of gait, Difficulty walking, Hemiplegia and Muscle weakness Rehab Potential: Excellent ELOS: 10-12 days   Today's Date: 04/08/2018 PT Individual Time: 0900-1000 PT Total Time (min): 60 min  Problem List:  Patient Active Problem List   Diagnosis Date Noted  . 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 Acute CVA (cerebrovascular accident) ([place]) 04/04/2018  . Tobacco use 04/04/2018  . Heart palpitations 12/14/2016  . Intermittent chest pain 12/14/2016  . LVH (left ventricular hypertrophy) 12/14/2016  . Depression 11/15/2016  . Essential hypertension 11/15/2016  . MS (multiple sclerosis) ([place]) 11/21/2015  . Fibroids 01/18/2012  . Menorrhagia 01/18/2012  . Seizure disorder ([place]) 01/18/2012  . Anemia 01/18/2012    Past Medical History:  Past Medical History:  Diagnosis Date  . Anemia   . Epilepsy ([place])   . Fibroids   . H/O bacterial infection   . H/O mumps   . Hyperlipidemia   . Hypertension   . Seizures ([place])    Past Surgical History:  Past Surgical History:  Procedure Laterality Date  . CESAREAN SECTION     x2. at term  . TEE WITHOUT CARDIOVERSION N/A 04/06/2018   Procedure: TRANSESOPHAGEAL ECHOCARDIOGRAM (TEE) BUBBLE STUDY;  Surgeon: Lelon Perla, MD;  Location: [facility] ENDOSCOPY;  Service: Cardiovascular;  Laterality: N/A;  . TUBAL LIGATION    . WISDOM TOOTH EXTRACTION      Assessment & Plan Clinical Impression: Patient is a 47 year old right-handed female with history of epilepsy, hypertension, hyperlipidemia and seizure disorder in the past and seizure free for 4-5 years with medical noncompliance due to financial issues. She has seen Dr. Gwenlyn Found the past concerning for MS but no LP was done. Presented 04/04/2018 with right-sided weakness and aphasia. Per chart review patient lives with boyfriend.  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 One level home. Cranial CT scan showed small area of cytotoxic edema in the left MCA M4 segment without hemorrhage or mass-effect. Underlying chronic bilateral cerebral white matter disease. CT angiogram of head and neck negative for large vessel occlusion. Patient did not receive TPA. Urine drug screen negative. 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 Negative head MRA. Venous Doppler studies lower extremity negative. Echocardiogram with ejection fraction 65% no wall motion abnormalities. TEE normal LV function, negative saline micro cavitation study/no PFO. An LP was completed 04/06/2018 to assess for CNS pathology as she had seen neurology in the past for questionable MS but no work-up was initiated at that time and results remain pending. Currently on aspirin/Plavixfor CVA prophylaxis. Therapy evaluations completed with recommendations of physical medicine rehab consult. Patient transferred to CIR on 04/07/2018 .   Patient currently requires min with mobility secondary to muscle weakness, decreased cardiorespiratoy endurance, and decreased standing balance, hemiplegia and decreased balance strategies.  Prior to hospitalization, patient was independent  with mobility and lived with Significant other in a [place] home.  Home access is flightStairs to enter.  Patient will benefit from skilled PT intervention to maximize safe functional mobility, minimize fall risk and decrease caregiver burden for planned discharge home with intermittent assist.  Anticipate patient will benefit from follow up [facility] at discharge.  PT - End of Session Activity Tolerance: Tolerates 10 - 20 min activity with multiple rests Endurance Deficit: Yes Endurance Deficit Description: decreased, frequent rest breaks needed 2/2 fatigue and increased work of breathing PT  Assessment Rehab Potential (ACUTE/IP ONLY): Excellent PT  Barriers to Discharge: Inaccessible home environment;Decreased caregiver support;Other (comments) PT Barriers to Discharge Comments: 12 steps to get to 2nd floor apartment, boyfriend works during the day, uninsured  PT Patient demonstrates impairments in the following area(s): Balance;Endurance;Motor;Safety PT Transfers Functional Problem(s): Bed Mobility;Bed to Chair;Car;Furniture;Floor PT Locomotion Functional Problem(s): Ambulation;Stairs PT Plan PT Intensity: Minimum of 1-2 x/day ,45 to 90 minutes PT Frequency: 5 out of 7 days PT Duration Estimated Length of Stay: 10-12 days PT Treatment/Interventions: Ambulation/gait training;Cognitive remediation/compensation;Discharge planning;DME/adaptive equipment instruction;Functional mobility training;Pain management;Psychosocial support;Splinting/orthotics;Therapeutic Activities;UE/LE Strength taining/ROM;Visual/perceptual remediation/compensation;UE/LE Coordination activities;Wheelchair propulsion/positioning;Therapeutic Exercise;Stair training;Skin care/wound management;Patient/family education;Neuromuscular re-education;Functional electrical stimulation;Disease management/prevention;Community reintegration;Balance/vestibular training PT Transfers Anticipated Outcome(s): independent w/ AD PT Locomotion Anticipated Outcome(s): indpendent w/ AD, household distances PT Recommendation Follow Up Recommendations: Home health PT Patient destination: Home Equipment Recommended: To be determined  Skilled Therapeutic Intervention  Pt sitting EOB and agreeable to therapy, denies pain. Performed functional mobility as detailed below including car transfer w/ min assist. Needed frequent rest breaks 2/2 fatigue and increased work of breathing throughout. Instructed pt and boyfriend in results of PT evaluation as detailed below, PT POC, rehab potential, rehab goals, and discharge recommendations. Additionally discussed CIR's policies regarding fall safety and use  of chair alarm and/or quick release belt when boyfriend is not providing direct supervision. Pt and boyfriend verbalized understanding and in agreement. Additionally, both verbalized understanding that pt can only transfer or get up when a staff member is assisting at this time for safety. Ended session in w/c, all needs in reach.  PT Evaluation Precautions/Restrictions Precautions Precautions: Fall Restrictions Weight Bearing Restrictions: No General   Vital Signs  Pain Pain Assessment Pain Scale: 0-10 Pain Score: 0-No pain Home Living/Prior Functioning Home Living Available Help at Discharge: Family Type of Home: Apartment Home Access: Stairs to enter Technical brewer of Steps: flight Entrance Stairs-Rails: Right;Left(cannot reach both) Home Layout: One level Bathroom Shower/Tub: Chiropodist: Standard  Lives With: Significant other Prior Function Level of Independence: Independent with basic ADLs;Independent with transfers;Independent with homemaking with ambulation;Independent with gait  Able to Take Stairs?: Yes Driving: Yes Vocation: Full time employment Vocation Requirements: works as a Secretary/administrator, Materials engineer: Within [place]: Intact  Cognition Overall Cognitive Status: Within Functional Limits for tasks assessed Arousal/Alertness: Awake/alert Orientation Level: Oriented X4 Selective Attention: Appears intact Memory: Appears intact Awareness: Appears intact Problem Solving: Appears intact Safety/Judgment: Appears intact Sensation Sensation Light Touch: Appears Intact Coordination Gross Motor Movements are Fluid and Coordinated: No Coordination and Movement Description: moves slowly and cautiously  Heel Shin Test: RLE impaired, suspect 2/2 weakness vs coordination impairments Motor  Motor Motor: Hemiplegia Motor - Skilled Clinical Observations: Mild R hemi    Mobility Bed Mobility Bed Mobility: Rolling Right;Rolling Left;Sit to Supine;Supine to Sit Rolling Right: Supervision/verbal cueing Rolling Left: Supervision/Verbal cueing Supine to Sit: Supervision/Verbal cueing Sit to Supine: Supervision/Verbal cueing Transfers Transfers: Sit to Stand;Stand to Sit;Stand Pivot Transfers Sit to Stand: Contact Guard/Touching assist Stand to Sit: Contact Guard/Touching assist Stand Pivot Transfers: Contact Guard/Touching assist Transfer (Assistive device): None Locomotion  Gait Ambulation: Yes Gait Assistance: Minimal Assistance - Patient > 75% Gait Distance (Feet): 125 Feet Assistive device: Rolling walker Gait Assistance Details: Verbal cues for precautions/safety;Tactile cues for posture Gait Gait: Yes Gait Pattern: Impaired Gait Pattern: Shuffle;Trunk flexed Gait velocity: decreased Stairs / Additional Locomotion Stairs: Yes Stairs Assistance: Minimal Assistance - Patient > 75% Stair Management Technique: Two rails Number  of Stairs: 4 Height of Stairs: 6 Wheelchair Mobility Wheelchair Mobility: No  Trunk/Postural Assessment  Cervical Assessment Cervical Assessment: Within Functional Limits Thoracic Assessment Thoracic Assessment: Within Functional Limits Lumbar Assessment Lumbar Assessment: Within Functional Limits Postural Control Postural Control: Within Functional Limits  Balance Balance Balance Assessed: Yes Static Sitting Balance Static Sitting - Balance Support: No upper extremity supported;Feet supported Static Sitting - Level of Assistance: 5: Stand by assistance Dynamic Sitting Balance Dynamic Sitting - Balance Support: No upper extremity supported;Feet supported Dynamic Sitting - Level of Assistance: 5: Stand by assistance Static Standing Balance Static Standing - Balance Support: No upper extremity supported;During functional activity Static Standing - Level of Assistance: 4: Min assist Dynamic Standing  Balance Dynamic Standing - Balance Support: No upper extremity supported;During functional activity Dynamic Standing - Level of Assistance: 4: Min assist Extremity Assessment  RLE Assessment RLE Assessment: Exceptions to [facility] Passive Range of Motion (PROM) Comments: WFL General Strength Comments: globally 4/5  LLE Assessment LLE Assessment: Within Functional Limits    Refer to Care Plan for Long Term Goals  Recommendations for other services: None   Discharge Criteria: Patient will be discharged from PT if patient refuses treatment 3 consecutive times without medical reason, if treatment goals not met, if there is a change in medical status, if patient makes no progress towards goals or if patient is discharged from hospital.  The above assessment, treatment plan, treatment alternatives and goals were discussed and mutually agreed upon: by patient and by family  Aretha Levi K Hines Kloss 04/08/2018, 12:03 PM

## 2018-04-08 NOTE — Progress Notes (Signed)
Pt was instructed in the beginning of the shift that she needs to call for assistance to the bathroom. Pt and significant other said " she can't wait for people to come get her" Pt became very frustrated over the bed alarm having to be on. Pt refused to wear her SCD's. Risks and benefits of refusal to call for help, wear SCD's and keeping the bed alarm on. Patient is confrontational and angry. Patient was told that the NT will be sitting right outside the door so she doesn't have to wait.  0500 NT went in for VS and  offered to take her to BR pt refused toileting. Pt requested  to be set up to wash, and sit at side of bed.The alarm went off and pt insisted that the alarm be off while at side of bed. NT was sitting outside of the room awaiting call for  Pt assistance. Patient never called for assistance. Patient claimed that she waited 2 hours and no one answered the light. The light went off at 2200 and NT took pillows, drinks,blankets to room.. Pt was toileted at 2100 by NT. Patient did not call for assistance and significant other took pt to the BR against advice from staff.

## 2018-04-09 ENCOUNTER — Inpatient Hospital Stay (HOSPITAL_COMMUNITY): Payer: Self-pay | Admitting: Physical Therapy

## 2018-04-09 ENCOUNTER — Inpatient Hospital Stay (HOSPITAL_COMMUNITY): Payer: Self-pay | Admitting: Occupational Therapy

## 2018-04-09 NOTE — Progress Notes (Signed)
Tina Moss PHYSICAL MEDICINE & REHABILITATION PROGRESS NOTE   Subjective/Complaints: Pt doing much better today. In good spirits. Things went much smoother the rest of the day. Notes some tingling in her right leg  ROS: Patient denies fever, rash, sore throat, blurred vision, nausea, vomiting, diarrhea, cough, shortness of breath or chest pain, joint or back pain, headache, or mood change.    Objective:   No results found. No results for input(s): WBC, HGB, HCT, PLT in the last 72 hours. No results for input(s): NA, K, CL, CO2, GLUCOSE, BUN, CREATININE, CALCIUM in the last 72 hours.  Intake/Output Summary (Last 24 hours) at 04/09/2018 1141 Last data filed at 04/09/2018 0829 Gross per 24 hour  Intake 480 ml  Output -  Net 480 ml     Physical Exam: Vital Signs Blood pressure 119/83, pulse 84, temperature 97.7 F (36.5 C), resp. rate 18, height 5\' 7"  (1.702 m), weight 85.6 kg, last menstrual period 03/21/2018, SpO2 100 %. Constitutional: No distress . Vital signs reviewed. HEENT: EOMI, oral membranes moist Neck: supple Cardiovascular: RRR without murmur. No JVD    Respiratory: CTA Bilaterally without wheezes or rales. Normal effort    GI: BS +, non-tender, non-distended  Musculoskeletal:Normal range of motion.  Neurological: She isalertand oriented to person, place, and time. Patient is expressively aphasic/apraxic. Marland Kitchen Speech non-fluent but can communicate ideas. RUE and RLE HP   4-/5 with apraxia. LUE and LLE 4+ to 5/5---stable  Skin: Skin iswarmand dry.  Psychiatric: She has anormal mood and affect. Herbehavior is normal    Assessment/Plan: 1. 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  Physiatrist is providing close team supervision and 24 hour management of active medical problems listed below.  Physiatrist and rehab team continue to assess barriers to  discharge/monitor patient progress toward functional and medical goals  Care Tool:  Bathing    Body parts bathed by patient: Right arm, Left arm, Chest, Abdomen, Front perineal area, Buttocks, Right upper leg, Left upper leg, Left lower leg, Face   Body parts bathed by helper: Right lower leg     Bathing assist Assist Level: Minimal Assistance - Patient > 75%     Upper Body Dressing/Undressing Upper body dressing   What is the patient wearing?: Pull over shirt    Upper body assist Assist Level: Supervision/Verbal cueing    Lower Body Dressing/Undressing Lower body dressing      What is the patient wearing?: Pants, Underwear/pull up     Lower body assist Assist for lower body dressing: Minimal Assistance - Patient > 75%     Toileting Toileting Toileting Activity did not occur (Clothing management and hygiene only): N/A (no void or bm)  Toileting assist Assist for toileting: Independent with assistive device     Transfers Chair/bed transfer  Transfers assist     Chair/bed transfer assist level: Minimal Assistance - Patient > 75%     Locomotion Ambulation   Ambulation assist      Assist level: Minimal Assistance - Patient > 75% Assistive device: Walker-rolling Max distance: 125'   Walk 10 feet activity   Assist     Assist level: Minimal Assistance - Patient > 75% Assistive device: Walker-rolling   Walk 50 feet activity   Assist    Assist level: Minimal Assistance - Patient > 75% Assistive device: Walker-rolling    Walk 150 feet activity   Assist Walk 150 feet activity did  not occur: Safety/medical concerns         Walk 10 feet on uneven surface  activity   Assist Walk 10 feet on uneven surfaces activity did not occur: Safety/medical concerns         Wheelchair     Assist Will patient use wheelchair at discharge?: No(anticipate pt will be primary ambulator at d/c)   Wheelchair activity did not occur: N/A          Wheelchair 50 feet with 2 turns activity    Assist    Wheelchair 50 feet with 2 turns activity did not occur: N/A       Wheelchair 150 feet activity     Assist Wheelchair 150 feet activity did not occur: N/A          Medical Problem List and Plan: 1.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 DVT Prophylaxis/Anticoagulation: SCDs. Venous Doppler studies negative 3. Pain Management:Tylenol as needed 4. Mood:Provide emotional support and positive reinforcement 5. Neuropsych: This patientiscapable of making decisions on herown behalf. 6. Skin/Wound Care:Routine skin checks 7. Fluids/Electrolytes/Nutrition:Routine in and outs with follow-up chemistries 8.Hypertension. Permissive hypertension. Patient on Lopressor 100 mg twice daily prior to admission question medical compliance.    -bp well controlled at present 9.History of epilepsy/seizure disorder as well as work-up for possible MS. No current seizure medication. Plan follow-up outpatient neurology services as patient has been seen by Dr. Tomi Likens in the past. LP results 04/06/2018 unremarkable 10.Hyperlipidemia. Lipitor  LOS: 2 days A FACE TO FACE EVALUATION WAS PERFORMED  [name] 04/09/2018, 11:41 AM

## 2018-04-09 NOTE — Progress Notes (Signed)
Physical Therapy Session Note  Patient Details  Name: Tina Moss MRN: [id number] Date of Birth: 1971-02-11  Today's Date: 04/09/2018 PT Individual Time: 1345-1425 PT Individual Time Calculation (min): 40 min   Short Term Goals: Week 1:  PT Short Term Goal 1 (Week 1): Pt will transfer bed<>chair w/ supervision PT Short Term Goal 2 (Week 1): Pt will ambulate 89' in household environment w/ LRAD and supervision PT Short Term Goal 3 (Week 1): Pt will maintain dynamic standing balance w/ supervision PT Short Term Goal 4 (Week 1): Pt will tolerate 30 min of OOB activity w/o increase in fatigue  Skilled Therapeutic Interventions/Progress Updates:  Pt was seen bedside in the pm. Pt performed all sit to stand transfers with S and rolling walker. Pt performed all stand pivot transfers with rolling walker and c/g. Pt ambulated 170 feet x 2 with rolling walker and c/g with verbal cues and standing rest breaks. In gym treatment focused on NMR utilizing step and alternating step taps. Pt returned to room following treatment and left sitting on edge of bed with bed alarm on and family at bedside.   Therapy Documentation Precautions:  Precautions Precautions: Fall Restrictions Weight Bearing Restrictions: No General:   Vital Signs:   Pain: Pt c/o 5/10 pain R LE.    Therapy/Group: Individual Therapy  Dub Amis 04/09/2018, 2:50 PM

## 2018-04-09 NOTE — Progress Notes (Signed)
Occupational Therapy Session Note  Patient Details  Name: Tina Moss MRN: [id number] Date of Birth: January 29, 1971  Today's Date: 04/09/2018 OT Individual Time: 7871-8367 OT Individual Time Calculation (min): 41 min   Short Term Goals: Week 1:  OT Short Term Goal 1 (Week 1): Pt will maintain dynamic standing balance during LB self care with supervision assist  OT Short Term Goal 2 (Week 1): Pt will complete toilet transfer with supervision using LRAD OT Short Term Goal 3 (Week 1): Pt will complete 3/3 components of toileting with supervision   Skilled Therapeutic Interventions/Progress Updates:    Pt greeted in bed with c/o R LE pain. RN notified and in to provide pain medicine during session. ADL needs met, so tx focus placed on dynamic balance, functional ambulation, and Rt NMR via IADL task of bedmaking. Pt ambulated in room to retrieve necessary linen with steady assist using RW. Education provided for draping these items over walker for safe transport. Pt stood with unilateral UE support to spread out sheets and comforter using R UE. Pt side-stepped around bed with device, steady assist, and cuing for proper walker placement. Small squats completed when pt tucked in sheets and comforters on both sides of bed. She doffed/donned pillowcases while seated to conserve energy. At end of session pt was left in bed with spouse present.   Therapy Documentation Precautions:  Precautions Precautions: Fall Restrictions Weight Bearing Restrictions: No Vital Signs: Therapy Vitals Temp: 97.9 F (36.6 C) Pulse Rate: 87 Resp: 14 BP: 114/77 Patient Position (if appropriate): Sitting Oxygen Therapy SpO2: 98 % O2 Device: Room Air Pain: Pain Assessment Pain Score: 0-No pain Faces Pain Scale: No hurt ADL: ADL Eating: Not assessed Grooming: Not assessed Upper Body Bathing: Supervision/safety Where Assessed-Upper Body Bathing: Shower Lower Body Bathing: Minimal assistance Where  Assessed-Lower Body Bathing: Shower Upper Body Dressing: Supervision/safety Where Assessed-Upper Body Dressing: Edge of bed Lower Body Dressing: Minimal assistance Where Assessed-Lower Body Dressing: Edge of bed Toileting: Not assessed Toilet Transfer: Not assessed Social research officer, government: Minimal assistance Social research officer, government Method: Heritage manager: Shower seat with back, Grab bars      Therapy/Group: Individual Therapy  Ayra Hodgdon A Grae Cannata 04/09/2018, 3:45 PM

## 2018-04-10 ENCOUNTER — Inpatient Hospital Stay (HOSPITAL_COMMUNITY): Payer: Self-pay

## 2018-04-10 ENCOUNTER — Inpatient Hospital Stay (HOSPITAL_COMMUNITY): Payer: Self-pay | Admitting: Physical Therapy

## 2018-04-10 ENCOUNTER — Inpatient Hospital Stay (HOSPITAL_COMMUNITY): Payer: Self-pay | Admitting: Occupational Therapy

## 2018-04-10 DIAGNOSIS — G8191 Hemiplegia, unspecified affecting right dominant side: Secondary | ICD-10-CM

## 2018-04-10 DIAGNOSIS — I69320 Aphasia following cerebral infarction: Secondary | ICD-10-CM

## 2018-04-10 LAB — CBC WITH DIFFERENTIAL/PLATELET
Abs Immature Granulocytes: 0.01 10*3/uL (ref 0.00–0.07)
Basophils Absolute: 0.1 10*3/uL (ref 0.0–0.1)
Basophils Relative: 2 %
Eosinophils Absolute: 0.1 10*3/uL (ref 0.0–0.5)
Eosinophils Relative: 2 %
HEMATOCRIT: 34.5 % — AB (ref 36.0–46.0)
HEMOGLOBIN: 10.1 g/dL — AB (ref 12.0–15.0)
IMMATURE GRANULOCYTES: 0 %
LYMPHS ABS: 1.3 10*3/uL (ref 0.7–4.0)
Lymphocytes Relative: 39 %
MCH: 21.3 pg — ABNORMAL LOW (ref 26.0–34.0)
MCHC: 29.3 g/dL — ABNORMAL LOW (ref 30.0–36.0)
MCV: 72.6 fL — ABNORMAL LOW (ref 80.0–100.0)
MONO ABS: 0.5 10*3/uL (ref 0.1–1.0)
MONOS PCT: 13 %
NEUTROS ABS: 1.5 10*3/uL — AB (ref 1.7–7.7)
Neutrophils Relative %: 44 %
Platelets: 242 10*3/uL (ref 150–400)
RBC: 4.75 MIL/uL (ref 3.87–5.11)
RDW: 22.5 % — ABNORMAL HIGH (ref 11.5–15.5)
WBC: 3.4 10*3/uL — ABNORMAL LOW (ref 4.0–10.5)
nRBC: 0 % (ref 0.0–0.2)

## 2018-04-10 LAB — CSF CULTURE W GRAM STAIN
Culture: NO GROWTH
Gram Stain: NONE SEEN

## 2018-04-10 LAB — COMPREHENSIVE METABOLIC PANEL
ALBUMIN: 3.7 g/dL (ref 3.5–5.0)
ALK PHOS: 89 U/L (ref 38–126)
ALT: 25 U/L (ref 0–44)
ANION GAP: 6 (ref 5–15)
AST: 23 U/L (ref 15–41)
BILIRUBIN TOTAL: 0.5 mg/dL (ref 0.3–1.2)
BUN: 10 mg/dL (ref 6–20)
CO2: 26 mmol/L (ref 22–32)
Calcium: 9.5 mg/dL (ref 8.9–10.3)
Chloride: 105 mmol/L (ref 98–111)
Creatinine, Ser: 0.61 mg/dL (ref 0.44–1.00)
GFR calc non Af Amer: >60 mL/min (ref >60)
GLUCOSE: 109 mg/dL — AB (ref 70–99)
Potassium: 3.4 mmol/L — ABNORMAL LOW (ref 3.5–5.1)
Sodium: 137 mmol/L (ref 135–145)
Total Protein: 7.4 g/dL (ref 6.5–8.1)

## 2018-04-10 LAB — VDRL, CSF: SYPHILIS VDRL QUANT CSF: NONREACTIVE

## 2018-04-10 LAB — GLUCOSE, CAPILLARY: Glucose-Capillary: 93 mg/dL (ref 70–99)

## 2018-04-10 NOTE — Plan of Care (Signed)
LTGs established °

## 2018-04-10 NOTE — IPOC Note (Addendum)
Overall Plan of Care [facility]) Patient Details Name: Tina Moss MRN: [id number] DOB: 1971-04-27  Admitting Diagnosis: <principal problem not specified>  Hospital Problems: Active Problems:   Left middle cerebral artery stroke [facility])     Functional Problem List: Nursing Endurance, Medication Management, Safety  PT Balance, Endurance, Motor, Safety  OT Balance, Safety, Endurance, Motor  SLP    TR         Basic ADL's: OT Grooming, Bathing, Dressing, Toileting     Advanced  ADL's: OT Simple Meal Preparation     Transfers: PT Bed Mobility, Bed to Chair, Car, Furniture, Floor  OT Toilet, Tub/Shower     Locomotion: PT Ambulation, Stairs     Additional Impairments: OT None  SLP Communication expression    TR      Anticipated Outcomes Item Anticipated Outcome  Self Feeding No goal  Swallowing      Basic self-care  Supervision/setup-Mod I   Toileting  Mod I    Bathroom Transfers Supervision/setup-Mod I   Bowel/Bladder     Transfers  independent w/ AD  Locomotion  indpendent w/ AD, household distances  Communication  Mod I  Cognition     Pain     Safety/Judgment  maintain safety with cues/supervision   Therapy Plan: PT Intensity: Minimum of 1-2 x/day ,45 to 90 minutes PT Frequency: 5 out of 7 days PT Duration Estimated Length of Stay: 10-12 days OT Intensity: Minimum of 1-2 x/day, 45 to 90 minutes OT Frequency: 5 out of 7 days OT Duration/Estimated Length of Stay: 10-12 days SLP Intensity: Minumum of 1-2 x/day, 30 to 90 minutes SLP Frequency: 3 to 5 out of 7 days SLP Duration/Estimated Length of Stay: 7 to 10 days    Team Interventions: Nursing Interventions Patient/Family Education, Disease Management/Prevention, Medication Management, Discharge Planning  PT interventions Ambulation/gait training, Cognitive remediation/compensation, Discharge planning, DME/adaptive equipment instruction, Functional mobility training, Pain management, Psychosocial  support, Splinting/orthotics, Therapeutic Activities, UE/LE Strength taining/ROM, Visual/perceptual remediation/compensation, UE/LE Coordination activities, Wheelchair propulsion/positioning, Therapeutic Exercise, Stair training, Skin care/wound management, Patient/family education, Neuromuscular re-education, Functional electrical stimulation, Disease management/prevention, Academic librarian, Training and development officer  OT Interventions Training and development officer, Community reintegration, Disease mangement/prevention, Brewing technologist, Barrister's clerk education, Self Care/advanced ADL retraining, Therapeutic Exercise, UE/LE Coordination activities, Wheelchair propulsion/positioning, Visual/perceptual remediation/compensation, UE/LE Strength taining/ROM, Therapeutic Activities, Skin care/wound managment, Psychosocial support, Pain management, Functional mobility training, DME/adaptive equipment instruction, Discharge planning, Cognitive remediation/compensation  SLP Interventions Speech/Language facilitation, Patient/family education, Functional tasks  TR Interventions    SW/CM Interventions  Psychosocial Assessment, Discharge Planning, and Pt & Family Educaiton   Barriers to Discharge MD  Medical stability  Nursing      PT Inaccessible home environment, Decreased caregiver support, Other (comments) 12 steps to get to 2nd floor apartment, boyfriend works during the day, uninsured   OT Decreased caregiver support Boyfriend works 8-5 every day  SLP      SW       Team Discharge Planning: Destination: PT-Home ,OT- Home , SLP-Home Projected Follow-up: PT-Home health PT, OT-  Home health OT, SLP-Outpatient SLP Projected Equipment Needs: PT-To be determined, OT- To be determined, SLP-None recommended by SLP Equipment Details: PT- , OT-  Patient/family involved in discharge planning: PT- Patient, Family member/caregiver,  OT-Patient, SLP-Patient, Family member/caregiver  MD ELOS:  10-12d Medical Rehab Prognosis:  Excellent Assessment:  47 year old right-handed female with history of epilepsy, hypertension, hyperlipidemia and seizure disorder in the past and seizure free for 4-5 years with medical noncompliance due to financial issues. She has  seen Dr. Gwenlyn Found the past concerning for MS but no LP was done. 47 year old right-handed female with history of epilepsy, hypertension, hyperlipidemia and seizure disorder in the past and seizure free for 4-5 years with medical noncompliance due to financial issues. right-sided weakness and aphasia. Per chart review patient lives with boyfriend. 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 One level home. Cranial CT scan showed small area of cytotoxic edema in the left MCA M4 segment without hemorrhage or mass-effect. Underlying chronic bilateral cerebral white matter disease. CT angiogram of head and neck negative for large vessel occlusion. Patient did not receive TPA. Urine drug screen negative. 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 Negative head MRA. Venous Doppler studies lower extremity negative. Echocardiogram with ejection fraction 65% no wall motion abnormalities. TEE normal LV function, negative saline micro cavitation study/no PFO. An LP was completed 04/06/2018 to assess for CNS pathology as she had seen neurology in the past for questionable MS but no work-up was initiated at that time and results remain pending   Now requiring 24/7 Rehab RN,MD, as well as CIR level PT, OT and SLP.  Treatment team will focus on ADLs and mobility with goals set at Sup/Mod I See Team Conference Notes for weekly updates to the plan of care

## 2018-04-10 NOTE — Progress Notes (Signed)
Social Work  Social Work Assessment and Plan  Patient Details  Name: Tina Moss MRN: [id number] Date of Birth: 1970/09/27  Today's Date: 04/10/2018  Problem List:  Patient Active Problem List   Diagnosis Date Noted  . Left middle cerebral artery stroke ([place]) 04/07/2018  . Acute CVA (cerebrovascular accident) ([place]) 04/04/2018  . Tobacco use 04/04/2018  . Heart palpitations 12/14/2016  . Intermittent chest pain 12/14/2016  . LVH (left ventricular hypertrophy) 12/14/2016  . Depression 11/15/2016  . Essential hypertension 11/15/2016  . MS (multiple sclerosis) ([place]) 11/21/2015  . Fibroids 01/18/2012  . Menorrhagia 01/18/2012  . Seizure disorder ([place]) 01/18/2012  . Anemia 01/18/2012   Past Medical History:  Past Medical History:  Diagnosis Date  . Anemia   . Epilepsy ([place])   . Fibroids   . H/O bacterial infection   . H/O mumps   . Hyperlipidemia   . Hypertension   . Seizures ([place])    Past Surgical History:  Past Surgical History:  Procedure Laterality Date  . CESAREAN SECTION     x2. at term  . TEE WITHOUT CARDIOVERSION N/A 04/06/2018   Procedure: TRANSESOPHAGEAL ECHOCARDIOGRAM (TEE) BUBBLE STUDY;  Surgeon: Lelon Perla, MD;  Location: [facility] ENDOSCOPY;  Service: Cardiovascular;  Laterality: N/A;  . TUBAL LIGATION    . WISDOM TOOTH EXTRACTION     Social History:  reports that she has been smoking cigars. She uses smokeless tobacco. She reports that she drinks alcohol. She reports that she has current or past drug history. Drug: Marijuana.  Family / Support Systems Marital Status: Divorced Patient Roles: Partner, Parent, Other (Comment)(employee) Spouse/Significant Other: Karma Greaser [phone] Children: Daughter and son who are grown and live on their own Other Supports: Lelan Pons Smith-Mom [phone] Anticipated Caregiver: Boyfriend, self and daughter Ability/Limitations of Caregiver: Boyfriend and daughter both work Building control surveyor Availability: Evenings  only Family Dynamics: Close with children/family and friends who have visited her and offered their support. Pt is one who has always been independent and taken careof herself. She plans to do this again.  Social History Preferred language: English Religion: Non-Denominational Cultural Background: No issues Education: High School Read: Yes Write: Yes Employment Status: Employed Name of Employer: Housekeeper Return to Work Plans: new employement wants to get back too due to likes this job Freight forwarder Issues: No issues Guardian/Conservator: none-according to MD pt is capable of making her own decisions while here. Boyfriend is very involved   Abuse/Neglect Abuse/Neglect Assessment Can Be Completed: Yes Physical Abuse: Denies Verbal Abuse: Denies Sexual Abuse: Denies Exploitation of patient/patient's resources: Denies Self-Neglect: Denies  Emotional Status Pt's affect, behavior adn adjustment status: Pt is motivated to get home and recover from this stroke, she still is processing that it happened to her and she is here in a hospital. She plans to change her life style habits and take better care of herself from now on. Recent Psychosocial Issues: other health issues was not seeing a MD due to insurance Pyschiatric History: No history deferred depression screen due to seems to be coping appropriately, but would benefit from seeing neuro-psych while here due to young age and deficits. Substance Abuse History: Tobacco aware needs to quit smoking due to health issues and she does plan to stop now.  Patient / Family Perceptions, Expectations & Goals Pt/Family understanding of illness & functional limitations: Pt is able to explain her stroke and deficits, she has spoken with the MD and feels her questions are being answered. She does want to get home  ASAP and back to work. Encouraged her toa sk MD when can she return to work. Premorbid pt/family roles/activities: partner,  daughter, Mom, friend, employee, etc Anticipated changes in roles/activities/participation: resume Pt/family expectations/goals: Pt states: : I want to get home and back to work I really like the job I have."  Boyfriend states: " I will help I work across the street and can check on her all the time."  [employer]: None Premorbid Home Care/DME Agencies: None Transportation available at discharge: Family and friends Resource referrals recommended: Neuropsychology, Support group (specify)  Discharge Planning Living Arrangements: Spouse/significant other Support Systems: Spouse/significant other, Children, Armed forces technical officer, Water engineer, Other relatives Type of Residence: Private residence Insurance Resources: Self-pay(Applying for [employer]) Museum/gallery curator Resources: Employment, Other (Comment) Financial Screen Referred: Yes Living Expenses: Rent Money Management: Patient, Significant Other Does the patient have any problems obtaining your medications?: Yes (Describe)(Uninsured insurance suppose to start in 04/2018) Home Management: Self Patient/Family Preliminary Plans: Return home with boyfriend who can provide intermittent assist due to works. Pt should get to mod/i level due to high level already. Asking when can return to work, aware to ask MD and await progress. Work with on discharge needs and get connected with PCP Sw Barriers to Discharge: Medication compliance, Decreased caregiver support Sw Barriers to Discharge Comments: Boyfriend worked and uninsured has no PCP Social Work Anticipated Follow Up Needs: HH/OP, Support Group  Clinical Impression Pleasant female who is willing to work hard in therapies to improve and regain her independence. She does need education in regards to her health issues and the importance of seeing a PCP and taking medications for her issues. Will get her connected to a PCP for follow up and work on discharge needs. Do feels he would  benefit from seeing neuro-psych while here.  Elease Hashimoto 04/10/2018, 9:54 AM

## 2018-04-10 NOTE — Progress Notes (Signed)
Speech Language Pathology Daily Session Note  Patient Details  Name: Tina Moss MRN: [id number] Date of Birth: 01-26-1971  Today's Date: 04/10/2018 SLP Individual Time: 6314-9702 SLP Individual Time Calculation (min): 70 min  Short Term Goals: Week 1: SLP Short Term Goal 1 (Week 1): Pt will utilization word finding strategies to convey semi-complex information with supervision cues.  SLP Short Term Goal 2 (Week 1): Pt will self-correct word finding errors with supervision cues.   Skilled Therapeutic Interventions:Skilled ST services focused on speech skills. SLP facilitated semi-complex word finding skills, utilizing communication barrier task, deductive and convergent task, pt required extra time, supervision A verbal cues for word finding and min A verbal cues to correct errors. Pt required supervision -min A verbal cues in conversation with MD and Education officer, museum. Pt was left in room with call bell within reach and bed alarm set. Recommend to continue skilled ST services.      Pain Pain Assessment Pain Scale: 0-10 Pain Score: 0-No pain  Therapy/Group: Individual Therapy  Uchenna Seufert 04/10/2018, 9:30 AM

## 2018-04-10 NOTE — Progress Notes (Signed)
Patient refusing to have bed alarm engaged.  Brita Romp, RN

## 2018-04-10 NOTE — Plan of Care (Signed)
  Problem: Consults Goal: RH STROKE PATIENT EDUCATION Description See Patient Education module for education specifics  Outcome: Progressing   Problem: RH SAFETY Goal: RH STG ADHERE TO SAFETY PRECAUTIONS W/ASSISTANCE/DEVICE Description STG Adhere to Safety Precautions With supervision Assistance/Device.  Outcome: Progressing   Problem: RH KNOWLEDGE DEFICIT Goal: RH STG INCREASE KNOWLEDGE OF HYPERTENSION Description Pt and family will be able to state understanding of medications and diet restrictions to manage HTN using resources independently  Outcome: Progressing Goal: RH STG INCREASE KNOWLEGDE OF HYPERLIPIDEMIA Description Pt will be able to state understanding of management of HLD using medications and dietary restrictions independently using resources provided  Outcome: Progressing Goal: RH STG INCREASE KNOWLEDGE OF STROKE PROPHYLAXIS Description Pt will be able to state stroke prophylaxis and rationale for use independently using resources/cues provided  Outcome: Progressing

## 2018-04-10 NOTE — Progress Notes (Signed)
Inpatient Rehabilitation  Patient information reviewed and entered into eRehab system by Namish Krise M. Ridhaan Dreibelbis, M.A., CCC/SLP, PPS Coordinator.  Information including medical coding, functional ability and quality indicators will be reviewed and updated through discharge.    

## 2018-04-10 NOTE — Progress Notes (Addendum)
Physical Therapy Session Note  Patient Details  Name: Tina Moss MRN: [id number] Date of Birth: 02-May-1971  Today's Date: 04/10/2018 PT Individual Time: 1306-1401 PT Individual Time Calculation (min): 55 min   Short Term Goals: Week 1:  PT Short Term Goal 1 (Week 1): Pt will transfer bed<>chair w/ supervision PT Short Term Goal 2 (Week 1): Pt will ambulate 58' in household environment w/ LRAD and supervision PT Short Term Goal 3 (Week 1): Pt will maintain dynamic standing balance w/ supervision PT Short Term Goal 4 (Week 1): Pt will tolerate 30 min of OOB activity w/o increase in fatigue  Skilled Therapeutic Interventions/Progress Updates:  Pt received in w/c & agreeable to tx. Pt denies c/o pain at rest but holds L side & notes cramping in L side after propelling w/c room>gym with BUE & supervision with rest break provided. Pt reports pain with urination & asks about bladder infection, RN made aware. Pt completes sit<>stand transfers with supervision and ambulates 200 ft with RW & supervision with 1 standing rest break & breathing heavily after task, HR = 83 bpm, SpO2 = 100% on room air. Pt performed standing step taps on 3" step progressing to cones without BUE support and min assist for balance with task focusing on weight shifting L<>R, coordination & balance & strengthening. Pt reports LLE pain with task & rest break taken PRN. Pt completed Berg Balance Test & scored 24/26; educated pt on interpretation of score & current fall risk. Patient demonstrates increased fall risk as noted by score of   30/56 on Berg Balance Scale.  (<36= high risk for falls, close to 100%; 37-45 significant >80%; 46-51 moderate >50%; 52-55 lower >25%). Pt very unstable during [place] but is able to correct and demonstrates good ankle strategies. Provided pt with w/c cushion to prevent skin breakdown while sitting up. At end of session pt left sitting in w/c in room with chair alarm donned & call bell  in reach.   Pt appears fatigued during session but reports she's a little tired. BP = 134/80 mmHg, LUE, sitting, HR = 91 bpm.  Therapy Documentation Precautions:  Precautions Precautions: Fall Restrictions Weight Bearing Restrictions: No   Balance: Balance Balance Assessed: Yes Standardized Balance Assessment Standardized Balance Assessment: Berg Balance Test Berg Balance Test Sit to Stand: Able to stand without using hands and stabilize independently Standing Unsupported: Able to stand 2 minutes with supervision Sitting with Back Unsupported but Feet Supported on Floor or Stool: Able to sit safely and securely 2 minutes Stand to Sit: Sits independently, has uncontrolled descent Transfers: Able to transfer with verbal cueing and /or supervision Standing Unsupported with Eyes Closed: Able to stand 10 seconds with supervision Standing Ubsupported with Feet Together: Able to place feet together independently and stand for 1 minute with supervision From Standing, Reach Forward with Outstretched Arm: Reaches forward but needs supervision From Standing Position, Pick up Object from Floor: Able to pick up shoe, needs supervision From Standing Position, Turn to Look Behind Over each Shoulder: Needs supervision when turning Turn 360 Degrees: Needs close supervision or verbal cueing Standing Unsupported, Alternately Place Feet on Step/Stool: Able to complete >2 steps/needs minimal assist Standing Unsupported, One Foot in Front: Able to take small step independently and hold 30 seconds Standing on One Leg: Tries to lift leg/unable to hold 3 seconds but remains standing independently Total Score: 30     Therapy/Group: Individual Therapy  Waunita Schooner 04/10/2018, 2:04 PM

## 2018-04-10 NOTE — Progress Notes (Signed)
Occupational Therapy Session Note  Patient Details  Name: Tina Moss MRN: [id number] Date of Birth: 1971-06-08  Today's Date: 04/10/2018 OT Individual Time: 2353-6144 OT Individual Time Calculation (min): 70 min    Short Term Goals: Week 1:  OT Short Term Goal 1 (Week 1): Pt will maintain dynamic standing balance during LB self care with supervision assist  OT Short Term Goal 2 (Week 1): Pt will complete toilet transfer with supervision using LRAD OT Short Term Goal 3 (Week 1): Pt will complete 3/3 components of toileting with supervision   Skilled Therapeutic Interventions/Progress Updates:    Treatment session with focus on sit > stand and dynamic standing balance.  Pt received upright in w/c declining bathing but agreeable to donning personal clothes this session.  Pt donned bra and shirt with setup assist and then donned pants with min assist when standing without UE support.  Engaged in [place] in standing without UE support to challenge dynamic balance and endurance.  Pt completed 1 trial of 60 seconds and then 2 trails of 2 mins with obvious fatigue setting in approx 90 seconds, but pt able to complete 2 mins before requiring seated rest break.  Engaged in "headbands" activity in standing with focus on increased activity tolerance while challenging word finding and sequencing.  Incorporated mini-squats with UE support during "headbands" activity.  Pt ambulated 125' with RW with min guard back to room.  Approx 100 feet pt demonstrating increased fatigue, however expressing desire to make it back to room before sitting.  Pt left sitting upright in w/c with chair alarm on and all needs in reach.  Therapy Documentation Precautions:  Precautions Precautions: Fall Restrictions Weight Bearing Restrictions: No Pain: Pain Assessment Pain Score: 0-No pain   Therapy/Group: Individual Therapy  Simonne Come 04/10/2018, 12:34 PM

## 2018-04-10 NOTE — Progress Notes (Signed)
[place] PHYSICAL MEDICINE & REHABILITATION PROGRESS NOTE   Subjective/Complaints: amb 86' with RW yesterday, remains aphasic but can do Y/N response  ROS: Patient denies bowel or bladder problems, breathing issues   Objective:   No results found. No results for input(s): WBC, HGB, HCT, PLT in the last 72 hours. Recent Labs    04/10/18 0550  NA 137  K 3.4*  CL 105  CO2 26  GLUCOSE 109*  BUN 10  CREATININE 0.61  CALCIUM 9.5    Intake/Output Summary (Last 24 hours) at 04/10/2018 0834 Last data filed at 04/09/2018 2300 Gross per 24 hour  Intake 840 ml  Output -  Net 840 ml     Physical Exam: Vital Signs Blood pressure 119/77, pulse 90, temperature 98 F (36.7 C), resp. rate 17, height 5\' 7"  (1.702 m), weight 85.6 kg, last menstrual period 03/21/2018, SpO2 99 %. Constitutional: No distress . Vital signs reviewed. HEENT: EOMI, oral membranes moist Neck: supple Cardiovascular: RRR without murmur. No JVD    Respiratory: CTA Bilaterally without wheezes or rales. Normal effort    GI: BS +, non-tender, non-distended  Musculoskeletal:Normal range of motion.  Neurological: She isalertand oriented to person, place, and time. Patient is expressively aphasic/apraxic. Marland Kitchen Speech non-fluent but can communicate ideas. RUE and RLE HP   4-/5 with apraxia. LUE and LLE 4+ to 5/5---stable  Skin: Skin iswarmand dry.  Psychiatric: She has anormal mood and affect. Herbehavior is normal    Assessment/Plan: 1. 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  Physiatrist is providing close team supervision and 24 hour management of active medical problems listed below.  Physiatrist and rehab team continue to assess barriers to discharge/monitor patient progress toward functional and medical goals  Care Tool:  Bathing    Body parts bathed by patient: Right arm, Left arm,  Chest, Abdomen, Front perineal area, Buttocks, Right upper leg, Left upper leg, Left lower leg, Face   Body parts bathed by helper: Right lower leg     Bathing assist Assist Level: Minimal Assistance - Patient > 75%     Upper Body Dressing/Undressing Upper body dressing   What is the patient wearing?: Pull over shirt    Upper body assist Assist Level: Supervision/Verbal cueing    Lower Body Dressing/Undressing Lower body dressing      What is the patient wearing?: Pants, Underwear/pull up     Lower body assist Assist for lower body dressing: Minimal Assistance - Patient > 75%     Toileting Toileting Toileting Activity did not occur (Clothing management and hygiene only): N/A (no void or bm)  Toileting assist Assist for toileting: Independent with assistive device     Transfers Chair/bed transfer  Transfers assist     Chair/bed transfer assist level: Supervision/Verbal cueing     Locomotion Ambulation   Ambulation assist      Assist level: Contact Guard/Touching assist Assistive device: Walker-rolling Max distance: 170   Walk 10 feet activity   Assist     Assist level: Contact Guard/Touching assist Assistive device: Walker-rolling   Walk 50 feet activity   Assist    Assist level: Contact Guard/Touching assist Assistive device: Walker-rolling    Walk 150 feet activity   Assist Walk 150 feet activity did not occur: Safety/medical concerns  Assist level: Contact Guard/Touching assist Assistive device: Walker-rolling    Walk 10 feet on uneven surface  activity   Assist Walk  10 feet on uneven surfaces activity did not occur: Safety/medical concerns         Wheelchair     Assist Will patient use wheelchair at discharge?: No(anticipate pt will be primary ambulator at d/c)   Wheelchair activity did not occur: N/A         Wheelchair 50 feet with 2 turns activity    Assist    Wheelchair 50 feet with 2 turns activity did  not occur: N/A       Wheelchair 150 feet activity     Assist Wheelchair 150 feet activity did not occur: N/A          Medical Problem List and Plan: 1.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 DVT Prophylaxis/Anticoagulation: SCDs. Venous Doppler studies negative 3. Pain Management:Tylenol as needed 4. Mood:Provide emotional support and positive reinforcement 5. Neuropsych: This patientiscapable of making decisions on herown behalf. 6. Skin/Wound Care:Routine skin checks 7. Fluids/Electrolytes/Nutrition:Routine in and outs with follow-up chemistries Mild HypoKalemia- supplement po 8.Hypertension. Permissive hypertension. Patient on Lopressor 100 mg twice daily prior to admission question medical compliance.     Vitals:   04/09/18 2023 04/10/18 0421  BP: (!) 139/92 119/77  Pulse: 98 90  Resp: 16 17  Temp: 98.1 F (36.7 C) 98 F (36.7 C)  SpO2: 100% 99%  Controlled 10/28 9.History of epilepsy/seizure disorder as well as work-up for possible MS. No current seizure medication. Plan follow-up outpatient neurology services as patient has been seen by Dr. Tomi Likens in the past. LP results 04/06/2018 unremarkable- work up for MS negative thus far, white matter lesions c/w SVD  10.Hyperlipidemia. Lipitor  LOS: 3 days A FACE TO FACE EVALUATION WAS PERFORMED  Charlett Blake 04/10/2018, 8:34 AM

## 2018-04-10 NOTE — Care Management Note (Signed)
[facility] Individual Statement of Services  Patient Name:  KYASIA STEUCK  Date:  04/10/2018  Welcome to the [facility].  Our goal is to provide you with an individualized program based on your diagnosis and situation, designed to meet your specific needs.  With this comprehensive rehabilitation program, you will be expected to participate in at least 3 hours of rehabilitation therapies Monday-Friday, with modified therapy programming on the weekends.  Your rehabilitation program will include the following services:  Physical Therapy (PT), Occupational Therapy (OT), Speech Therapy (ST), 24 hour per day rehabilitation nursing, Therapeutic Recreaction (TR), Neuropsychology, Case Management (Social Worker), Rehabilitation Medicine, Nutrition Services and Pharmacy Services  Weekly team conferences will be held on Wednesday to discuss your progress.  Your Social Worker will talk with you frequently to get your input and to update you on team discussions.  Team conferences with you and your family in attendance may also be held.  Expected length of stay: 10-12 days  Overall anticipated outcome: independent with device  Depending on your progress and recovery, your program may change. Your Social Worker will coordinate services and will keep you informed of any changes. Your Social Worker's name and contact numbers are listed  below.  The following services may also be recommended but are not provided by the [facility] will be made to provide these services after discharge if needed.  Arrangements include referral to agencies that provide these services.  Your insurance has been verified to be:  Self Pay-applying for Medicaid Your primary doctor is:  None  Pertinent information will be shared  with your doctor and your insurance company.  Social Worker:  Ovidio Kin, [place] or ([phone]  Information discussed with and copy given to patient by: Elease Hashimoto, 04/10/2018, 9:36 AM

## 2018-04-11 ENCOUNTER — Inpatient Hospital Stay (HOSPITAL_COMMUNITY): Payer: Self-pay | Admitting: Physical Therapy

## 2018-04-11 ENCOUNTER — Inpatient Hospital Stay (HOSPITAL_COMMUNITY): Payer: Self-pay

## 2018-04-11 ENCOUNTER — Encounter (HOSPITAL_COMMUNITY): Payer: Self-pay

## 2018-04-11 ENCOUNTER — Inpatient Hospital Stay (HOSPITAL_COMMUNITY): Payer: Self-pay | Admitting: Occupational Therapy

## 2018-04-11 NOTE — Progress Notes (Signed)
[facility] PHYSICAL MEDICINE & REHABILITATION PROGRESS NOTE   Subjective/Complaints: Remains aphasic but can state name and repeat, working with OT on standing balance and cognition  ROS: Patient denies bowel or bladder problems, breathing issues   Objective:   No results found. Recent Labs    04/10/18 0550  WBC 3.4*  HGB 10.1*  HCT 34.5*  PLT 242   Recent Labs    04/10/18 0550  NA 137  K 3.4*  CL 105  CO2 26  GLUCOSE 109*  BUN 10  CREATININE 0.61  CALCIUM 9.5    Intake/Output Summary (Last 24 hours) at 04/11/2018 0844 Last data filed at 04/10/2018 1800 Gross per 24 hour  Intake 240 ml  Output -  Net 240 ml     Physical Exam: Vital Signs Blood pressure 139/81, pulse 92, temperature 97.6 F (36.4 C), temperature source Oral, resp. rate 18, height 5\' 7"  (1.702 m), weight 85.6 kg, last menstrual period 03/21/2018, SpO2 100 %. Constitutional: No distress . Vital signs reviewed. HEENT: EOMI, oral membranes moist Neck: supple Cardiovascular: RRR without murmur. No JVD    Respiratory: CTA Bilaterally without wheezes or rales. Normal effort    GI: BS +, non-tender, non-distended  Musculoskeletal:Normal range of motion.  Neurological: She isalertand oriented to person, place, and time. Patient is expressively aphasic/apraxic. Marland Kitchen Speech non-fluent but can communicate ideas. RUE and RLE HP   4-/5 with apraxia. LUE and LLE 4+ to 5/5---stable  Skin: Skin iswarmand dry.  Psychiatric: She has anormal mood and affect. Herbehavior is normal    Assessment/Plan: 1. 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  Physiatrist is providing close team supervision and 24 hour management of active medical problems listed below.  Physiatrist and rehab team continue to assess barriers to discharge/monitor patient progress toward functional and medical goals  Care  Tool:  Bathing    Body parts bathed by patient: Right arm, Left arm, Chest, Abdomen, Front perineal area, Buttocks, Right upper leg, Left upper leg, Left lower leg, Face, Right lower leg(bathed independently at bed level)   Body parts bathed by helper: Right lower leg     Bathing assist Assist Level: Set up assist     Upper Body Dressing/Undressing Upper body dressing   What is the patient wearing?: Pull over shirt, Bra    Upper body assist Assist Level: Set up assist    Lower Body Dressing/Undressing Lower body dressing      What is the patient wearing?: Pants     Lower body assist Assist for lower body dressing: Contact Guard/Touching assist     Toileting Toileting Toileting Activity did not occur (Clothing management and hygiene only): N/A (no void or bm)  Toileting assist Assist for toileting: Supervision/Verbal cueing     Transfers Chair/bed transfer  Transfers assist     Chair/bed transfer assist level: Supervision/Verbal cueing     Locomotion Ambulation   Ambulation assist      Assist level: Supervision/Verbal cueing Assistive device: Walker-rolling Max distance: 200 ft    Walk 10 feet activity   Assist     Assist level: Supervision/Verbal cueing Assistive device: Walker-rolling   Walk 50 feet activity   Assist    Assist level: Supervision/Verbal cueing Assistive device: Walker-rolling    Walk 150 feet activity   Assist Walk 150 feet activity did not occur: Safety/medical concerns  Assist level: Supervision/Verbal cueing Assistive device: Walker-rolling  Walk 10 feet on uneven surface  activity   Assist Walk 10 feet on uneven surfaces activity did not occur: Safety/medical concerns         Wheelchair     Assist Will patient use wheelchair at discharge?: No(anticipate pt will be primary ambulator at d/c)   Wheelchair activity did not occur: N/A  Wheelchair assist level: Supervision/Verbal cueing Max  wheelchair distance: 150 ft     Wheelchair 50 feet with 2 turns activity    Assist    Wheelchair 50 feet with 2 turns activity did not occur: N/A   Assist Level: Supervision/Verbal cueing   Wheelchair 150 feet activity     Assist Wheelchair 150 feet activity did not occur: N/A   Assist Level: Supervision/Verbal cueing      Medical Problem List and Plan: 1.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 DVT Prophylaxis/Anticoagulation: SCDs. Venous Doppler studies negative 3. Pain Management:Tylenol as needed 4. Mood:Provide emotional support and positive reinforcement 5. Neuropsych: This patientiscapable of making decisions on herown behalf. 6. Skin/Wound Care:Routine skin checks 7. Fluids/Electrolytes/Nutrition:Routine in and outs with follow-up chemistries Mild HypoKalemia- supplement po 8.Hypertension. Permissive hypertension. Patient on Lopressor 100 mg twice daily prior to admission question medical compliance.     Vitals:   04/10/18 2015 04/11/18 0434  BP: 139/81 139/81  Pulse: 98 92  Resp: 16 18  Temp: 97.7 F (36.5 C) 97.6 F (36.4 C)  SpO2: 100% 100%  Controlled 10/29 9.History of epilepsy/seizure disorder as well as work-up for possible MS. No current seizure medication. Plan follow-up outpatient neurology services as patient has been seen by Dr. Tomi Likens in the past. LP results 04/06/2018 unremarkable- work up for MS negative thus far, white matter lesions c/w SVD  No seizure since rehab admit 10.Hyperlipidemia. Lipitor 10.  Microcytic anemia improving LOS: 4 days A FACE TO FACE EVALUATION WAS PERFORMED  Charlett Blake 04/11/2018, 8:44 AM

## 2018-04-11 NOTE — Progress Notes (Signed)
Physical Therapy Session Note  Patient Details  Name: Tina Moss MRN: [id number] Date of Birth: 1971-05-02  Today's Date: 04/11/2018 PT Individual Time: 8032-1224 PT Individual Time Calculation (min): 73 min   Short Term Goals: Week 1:  PT Short Term Goal 1 (Week 1): Pt will transfer bed<>chair w/ supervision PT Short Term Goal 2 (Week 1): Pt will ambulate 29' in household environment w/ LRAD and supervision PT Short Term Goal 3 (Week 1): Pt will maintain dynamic standing balance w/ supervision PT Short Term Goal 4 (Week 1): Pt will tolerate 30 min of OOB activity w/o increase in fatigue  Skilled Therapeutic Interventions/Progress Updates:  Pt received in w/c & agreeable to tx. Pt reports aching in back & B sides, with pt stating she thinks it's from walking & rest breaks provided PRN. Pt completes sit<>stand with supervision & occasional cuing for hand placement on stable surface, and ambulates room>gym>dayroom>room  with RW & supervision with cuing for upright posture and pt demonstrating decreased gait speed. Therapist adjusted height of RW to increase height for more upright posture while standing. Pt negotiates 12 steps with R rail, laterally with BUE support, with supervision & cuing for sequencing as pt attempts to cross feet when descending stairs. Pt completes floor transfer with supervision. While ambulating gym>dayroom pt reports something "catches" and reports something with her heart and causing her to feel like she has to sneeze or cough but is unable with pt appearing as she does have pain or some other uncomfortable sensation. When asked if she felt her heart was fluttering pt replied yes. When resting in dayroom pt's SpO2 = 100% on room air, HR = 84 bpm. RN made aware & cleared pt for continuation in therapy, with recommendations to monitor pt's symptoms. Pt utilized cybex kinetron in sitting then standing with BUE support>1 UE support>no UE support with close supervision  increasing to min assist as UE support decreased with cuing for upright posture with task focusing on weight shifting, strengthening & dynamic balance, as well as RLE NMR. Gait training without AD with min assist for balance with task focusing on RLE NMR & dynamic balance. Trialed gait with SPC with education regarding use of AD in LUE but pt with significantly difficulty coordinating gait pattern with AD, therefore reverted back to gait without AD for further challenges to balance. At end of session pt returned to room & left sitting in w/c in room with chair alarm donned, call bell in reach.   Therapy Documentation Precautions:  Precautions Precautions: Fall Restrictions Weight Bearing Restrictions: No    Therapy/Group: Individual Therapy  Waunita Schooner 04/11/2018, 2:31 PM

## 2018-04-11 NOTE — Progress Notes (Signed)
Speech Language Pathology Daily Session Note  Patient Details  Name: Tina Moss MRN: [id number] Date of Birth: [birth year]  Today's Date: 04/11/2018 SLP Individual Time: 0900-1000 SLP Individual Time Calculation (min): 60 min  Short Term Goals: Week 1: SLP Short Term Goal 1 (Week 1): Pt will utilization word finding strategies to convey semi-complex information with supervision cues.  SLP Short Term Goal 2 (Week 1): Pt will self-correct word finding errors with supervision cues.   Skilled Therapeutic Interventions:Skilled ST services focused on speech skills. SLP facilitated use of word finding strategies in structed semi-complex divergent and convergent tasks, pt required supervision A verbal cues, naming items given limited context required min A verbal cues and in unstructed informal conversation, pt required supervision-min A verbal cues. Pt required min-supervision A verbal cues to correct verbal errors and to cslow rate of speech to increase intelligibility. Pt was left in room with call bell within reach and chair alarm set. SLP reccomends to continue skilled services.     Pain Pain Assessment Pain Score: 0-No pain  Therapy/Group: Individual Therapy  Zachari Alberta  [facility] 04/11/2018, 3:08 PM

## 2018-04-11 NOTE — Progress Notes (Signed)
Occupational Therapy Session Note  Patient Details  Name: Tina Moss MRN: [id number] Date of Birth: [birth year]  Today's Date: 04/11/2018 OT Individual Time: 0800-0900 OT Individual Time Calculation (min): 60 min    Short Term Goals: Week 1:  OT Short Term Goal 1 (Week 1): Pt will maintain dynamic standing balance during LB self care with supervision assist  OT Short Term Goal 2 (Week 1): Pt will complete toilet transfer with supervision using LRAD OT Short Term Goal 3 (Week 1): Pt will complete 3/3 components of toileting with supervision   Skilled Therapeutic Interventions/Progress Updates:    Treatment session with focus on activity tolerance and dynamic standing balance.  Pt received upright reporting need to toilet.  Ambulated to bathroom with RW with close supervision and pt completed toileting with min guard for standing balance when pulling underwear over hips.  Pt completed dressing at sit > stand level with min guard when pulling pants over hips and increased time to don and fasten tennis shoes.  Pt ambulated 200' with RW with min guard - supervision.  Engaged in dynamic standing activity with tossing ball overhead to self with min guard progressing to tossing ball with 2nd person while this therapist provided min guard for standing balance.  Increased challenge to naming items in alphabetical order and then to naming football teams (as pt enjoys football).  Pt demonstrating increased challenge with word finding, but demonstrating increased awareness of errors.  Increased physical challenge to standing on compliant surface with min assist while continuing to toss ball and name items with 2nd person.  Pt reports increased fatigue post standing on foam surface.  Seated rest break prior to ambulating back to room with RW and min assist.  Pt left upright in w/c with all needs in reach awaiting SLP.  Therapy Documentation Precautions:  Precautions Precautions: Fall Restrictions Weight  Bearing Restrictions: No Pain:  Pt with no c/o pain   Therapy/Group: Individual Therapy  Simonne Come 04/11/2018, 9:01 AM

## 2018-04-11 NOTE — Progress Notes (Signed)
PT reported to RN that pt. Verbalized discomfort on her chest with activity,[facility] [place] [facility] has been notified,keep monitoring pt. Closely.

## 2018-04-12 ENCOUNTER — Inpatient Hospital Stay (HOSPITAL_COMMUNITY): Payer: Self-pay | Admitting: Physical Therapy

## 2018-04-12 ENCOUNTER — Inpatient Hospital Stay (HOSPITAL_COMMUNITY): Payer: Self-pay | Admitting: Speech Pathology

## 2018-04-12 ENCOUNTER — Inpatient Hospital Stay (HOSPITAL_COMMUNITY): Payer: Self-pay | Admitting: Occupational Therapy

## 2018-04-12 ENCOUNTER — Encounter (HOSPITAL_COMMUNITY): Payer: Self-pay | Admitting: Psychology

## 2018-04-12 ENCOUNTER — Encounter (HOSPITAL_COMMUNITY): Payer: Self-pay | Admitting: Emergency Medicine

## 2018-04-12 DIAGNOSIS — R4701 Aphasia: Secondary | ICD-10-CM

## 2018-04-12 LAB — OLIGOCLONAL BANDS, CSF + SERM

## 2018-04-12 MED ORDER — MUSCLE RUB 10-15 % EX CREA
TOPICAL_CREAM | Freq: Two times a day (BID) | CUTANEOUS | Status: DC | PRN
  Filled 2018-04-12: qty 85

## 2018-04-12 MED ORDER — TROLAMINE SALICYLATE 10 % EX CREA
TOPICAL_CREAM | Freq: Two times a day (BID) | CUTANEOUS | Status: DC | PRN
  Filled 2018-04-12: qty 85

## 2018-04-12 MED ORDER — FAMOTIDINE 10 MG PO TABS
10.0000 mg | ORAL_TABLET | Freq: Two times a day (BID) | ORAL | Status: DC
  Administered 2018-04-12 – 2018-04-18 (×13): 10 mg via ORAL
  Filled 2018-04-12 (×13): qty 1

## 2018-04-12 NOTE — Plan of Care (Signed)
  Problem: RH SAFETY Goal: RH STG ADHERE TO SAFETY PRECAUTIONS W/ASSISTANCE/DEVICE Description STG Adhere to Safety Precautions With supervision Assistance/Device.  Outcome: Progressing  Call light within reach, bed/chair alarm, proper footwear

## 2018-04-12 NOTE — Progress Notes (Signed)
Speech Language Pathology Daily Session Note  Patient Details  Name: Tina Moss MRN: [id number] Date of Birth: November 16, 1970  Today's Date: 04/12/2018 SLP Individual Time: 1305-1400 SLP Individual Time Calculation (min): 55 min  Short Term Goals: Week 1: SLP Short Term Goal 1 (Week 1): Pt will utilization word finding strategies to convey semi-complex information with supervision cues.  SLP Short Term Goal 2 (Week 1): Pt will self-correct word finding errors with supervision cues.   Skilled Therapeutic Interventions:  Pt was seen for skilled  ST targeting communication goals.  Pt ambulated to the day room for the fall festival and was mod I with increased time for word finding during functional conversations.  Pt was also able to complete a word scramble activity at the festival with mod I.  Pt was returned to room and left in wheelchair with all needs within reach.  Continue per current plan of care.     Pain Pain Assessment Pain Scale: 0-10 Pain Score: 0-No pain  Therapy/Group: Individual Therapy  Kamaryn Grimley, Selinda Orion 04/12/2018, 2:16 PM

## 2018-04-12 NOTE — Progress Notes (Addendum)
fPhysical Therapy Session Note  Patient Details  Name: Tina Moss MRN: [id number] Date of Birth: [birth year]  Today's Date: 04/12/2018 PT Individual Time: [phone] and [phone] PT Individual Time Calculation (min): 57 min and 30 min   Short Term Goals: Week 1:  PT Short Term Goal 1 (Week 1): Pt will transfer bed<>chair w/ supervision PT Short Term Goal 2 (Week 1): Pt will ambulate 40' in household environment w/ LRAD and supervision PT Short Term Goal 3 (Week 1): Pt will maintain dynamic standing balance w/ supervision PT Short Term Goal 4 (Week 1): Pt will tolerate 30 min of OOB activity w/o increase in fatigue  Skilled Therapeutic Interventions/Progress Updates:  Treatment 1: Pt received sitting EOB & agreeable to tx. Pt denies pain at beginning of session pt reports she felt sharp then aching pain in L chest in the early morning that did not dissipate with change in bed position, but later went away - MD made aware. Pt completed grooming tasks sitting EOB with mod I & standing at sink with RW & supervision for standing balance (fixing hair, brushing teeth, donning shoes). Pt ambulates ~40 ft with RW & supervision with decreased gait speed, returning to room to allow MD to assess her. While waiting on MD arrival pt completes sit<>stand without BUE support for BLE strengthening with supervision overall. While MD assessed pt therapist provided information regarding pt's c/o this morning & yesterday during therapy session; after assessment MD reports he feels pt's c/o are due to musculoskeletal issues & reports no need for therapy restrictions. Pt ambulates room>gym>dayroom>room with RW & supervision with cuing to reach back & for eccentric control with stand>sit. In gym pt completes lateral step ups on 6" step with RUE then no UE support with close supervision increasing to min assist for balance with task focusing on dynamic balance, RLE strengthening & NMR. Pt utilized nu-step on level 4 x 8  minutes with all four extremities with task focusing on coordination of reciprocal movements, endurance training & R NMR with encouragement to maintain increased steps/minute. At end of session pt left sitting in w/c with needs at hand and chair alarm set.   Addendum: Provided pt with RW bag & educated pt on need to maintain BUE on RW and to transport items in bag.  Treatment 2: Pt received in w/c & agreeable to tx. During session pt reports R knee pain & rest breaks taken PRN. Pt ambulates room>dayroom>apartment>room with RW & supervision. In dayroom, pt engaged in pumpkin ring toss and corn hole without BUE support while reaching outside of BOS to R to obtain & toss objects with close supervision<>min assist on one occasion 2/2 LOB. Pt completed bed mobility in apartment with supervision & extra time. At end of session pt left in w/c in room with chair alarm donned & all needs in reach.  Therapy Documentation Precautions:  Precautions Precautions: Fall Restrictions Weight Bearing Restrictions: No    Therapy/Group: Individual Therapy  Waunita Schooner 04/12/2018, 3:05 PM

## 2018-04-12 NOTE — Patient Care Conference (Signed)
Inpatient RehabilitationTeam Conference and Plan of Care Update Date: 04/12/2018   Time: 10:40 AM    Patient Name: Tina Moss      Medical Record Number: [id number]  Date of Birth: [birth year] Sex: Female         Room/Bed: 4W22C/4W22C-01 Payor Info: Payor: MEDICAID POTENTIAL / Plan: MEDICAID POTENTIAL / Product Type: *No Product type* /    Admitting Diagnosis: L CVA  Admit Date/Time:  04/07/2018  3:30 PM Admission Comments: No comment available   Primary Diagnosis:  <principal problem not specified> Principal Problem: <principal problem not specified>  Patient Active Problem List   Diagnosis Date Noted  . 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 Acute CVA (cerebrovascular accident) ([place]) 04/04/2018  . Tobacco use 04/04/2018  . Heart palpitations 12/14/2016  . Intermittent chest pain 12/14/2016  . LVH (left ventricular hypertrophy) 12/14/2016  . Depression 11/15/2016  . Essential hypertension 11/15/2016  . MS (multiple sclerosis) ([place]) 11/21/2015  . Fibroids 01/18/2012  . Menorrhagia 01/18/2012  . Seizure disorder ([place]) 01/18/2012  . Anemia 01/18/2012    Expected Discharge Date: Expected Discharge Date: 04/18/18  Team Members Present: Physician leading conference: Dr. Alysia Penna Social Worker Present: Ovidio Kin, LCSW Nurse Present: Rayetta Pigg, RN PT Present: Lavone Nian, PT OT Present: Simonne Come, OT SLP Present: Stormy Fabian, SLP PPS Coordinator present : Daiva Nakayama, RN, CRRN     Current Status/Progress Goal Weekly Team Focus  Medical   Patient ambulating with therapy assistance, has had musculoskeletal chest pain as well as GERD symptoms  To medical stability, further work-up of chest pain if symptoms change  See above, discharge planning   Bowel/Bladder   pt continent of B/B LBM 10/29  remain continent with normal bowel pattern  toilet pt q2hr and prn laxatives prn   Swallow/Nutrition/ Hydration             ADL's   min  guard with dynamic standing for clothing management with dressing and toileting tasks, supervision/setup for dressing, supervision bathroom transfers  Mod I overall, setup bathing and shower transfers  ADL retraining, dynamic standing balance, endurance, safety awareness, pt education, d/c planning   Mobility   supervision with RW, decreased endurance, supervision for 12 steps with 1 rail & pt has 17 steps to access apartment  mod I with LRAD except supervision for stair negotiation & community mobility  stair negotiation, endurance training, balance, pt education, gait, transfers   Communication   Min A to supervision for word finding in complex information  Mod I   use of word finding strategies to convey complex thoughts   Safety/Cognition/ Behavioral Observations            Pain   pt denies any pain  will remain pain free  assess pain qshift and prn medicate as ordered   Skin   Abrasions to bilateral arms   no further breakdown of infection healing of above noted  assess skin qshift and prn      *See Care Plan and progress notes for long and short-term goals.     Barriers to Discharge  Current Status/Progress Possible Resolutions Date Resolved   Physician    Medical stability     Progressing towards goals  See above      Nursing                  PT  Inaccessible home environment;Decreased caregiver support;Other (comments)  12 steps to get to 2nd floor apartment, boyfriend  works during the day, uninsured              OT Decreased caregiver support  Boyfriend works 8-5 every day             SLP                SW Medication compliance;Decreased caregiver support Boyfriend worked and uninsured has no PCP            Discharge Planning/Teaching Needs:  Home with boyfriend who works across the street from where they live, can check on frequently. Pt concerned about going back to work      Team Discussion:  Goals mod/i level, currently at supervision-min level. Improving in her  therapies. Working on 17 steps to get into her second floor apartment. CP MD addressing feels it is reflux and skeletal pain. Berg 30/56. Started on pepcid. Speech working on complex expression  Revisions to Treatment Plan:  DC 11/5    Continued Need for Acute Rehabilitation Level of Care: The patient requires daily medical management by a physician with specialized training in physical medicine and rehabilitation for the following conditions: Daily direction of a multidisciplinary physical rehabilitation program to ensure safe treatment while eliciting the highest outcome that is of practical value to the patient.: Yes Daily medical management of patient stability for increased activity during participation in an intensive rehabilitation regime.: Yes Daily analysis of laboratory values and/or radiology reports with any subsequent need for medication adjustment of medical intervention for : Neurological problems   I attest that I was present, lead the team conference, and concur with the assessment and plan of the team.   Elease Hashimoto 04/12/2018, 1:07 PM

## 2018-04-12 NOTE — Progress Notes (Addendum)
[facility] PHYSICAL MEDICINE & REHABILITATION PROGRESS NOTE   Subjective/Complaints:  Has complained of several episodes of Chest pain in left upper chest, left lower chest , Right upper chest and epigastric area.(pt is able to point)  Has had some feeling of gas and fluttering in chest. Pt denies SOB or diaphoresis, occurs both with activity or at rest and lasts several minutes, no problems prior to CVA Reviewed EKG and TEE report   ROS: Patient denies bowel or bladder problems, breathing issues   Objective:   No results found. Recent Labs    04/10/18 0550  WBC 3.4*  HGB 10.1*  HCT 34.5*  PLT 242   Recent Labs    04/10/18 0550  NA 137  K 3.4*  CL 105  CO2 26  GLUCOSE 109*  BUN 10  CREATININE 0.61  CALCIUM 9.5    Intake/Output Summary (Last 24 hours) at 04/12/2018 0831 Last data filed at 04/11/2018 1846 Gross per 24 hour  Intake 682 ml  Output -  Net 682 ml     Physical Exam: Vital Signs Blood pressure 124/83, pulse (!) 101, temperature 97.7 F (36.5 C), temperature source Oral, resp. rate 18, height 5' 7"  (1.702 m), weight 85.6 kg, last menstrual period 03/21/2018, SpO2 97 %. Constitutional: No distress . Vital signs reviewed. HEENT: EOMI, oral membranes moist Neck: supple Cardiovascular: RRR without murmur. No JVD    Respiratory: CTA Bilaterally without wheezes or rales. Normal effort    GI: BS +,epigastric tenderness, non-distended  Musculoskeletal:Normal range of motion.para sternal tenderness at T2 level parasternal and left ant axillary line at T10 on left Neurological: She isalertand oriented to person, place, and time. Patient is expressively aphasic/apraxic. Marland Kitchen Speech non-fluent but can communicate ideas. RUE and RLE HP   4-/5 with apraxia. LUE and LLE 4+ to 5/5---stable  Skin: Skin iswarmand dry.  Psychiatric: She has anormal mood and affect. Herbehavior is normal    Assessment/Plan: 1. 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 which  require 3+ hours per day of interdisciplinary therapy in a comprehensive inpatient rehab setting.  Physiatrist is providing close team supervision and 24 hour management of active medical problems listed below.  Physiatrist and rehab team continue to assess barriers to discharge/monitor patient progress toward functional and medical goals  Care Tool:  Bathing    Body parts bathed by patient: Right arm, Left arm, Chest, Abdomen, Front perineal area, Buttocks, Right upper leg, Left upper leg, Left lower leg, Face, Right lower leg(bathed independently at bed level)   Body parts bathed by helper: Right lower leg     Bathing assist Assist Level: Set up assist     Upper Body Dressing/Undressing Upper body dressing   What is the patient wearing?: Pull over shirt, Bra    Upper body assist Assist Level: Set up assist    Lower Body Dressing/Undressing Lower body dressing      What is the patient wearing?: Pants     Lower body assist Assist for lower body dressing: Contact Guard/Touching assist     Toileting Toileting Toileting Activity did not occur (Clothing management and hygiene only): N/A (no void or bm)  Toileting assist Assist for toileting: Contact Guard/Touching assist     Transfers Chair/bed transfer  Transfers assist     Chair/bed transfer assist level: Supervision/Verbal cueing     Locomotion Ambulation   Ambulation assist      Assist level: Supervision/Verbal cueing Assistive device: Walker-rolling Max distance: 150 ft    Walk 10  feet activity   Assist     Assist level: Supervision/Verbal cueing Assistive device: Walker-rolling   Walk 50 feet activity   Assist    Assist level: Supervision/Verbal cueing Assistive device: Walker-rolling    Walk 150 feet activity   Assist Walk 150 feet activity did not occur: Safety/medical concerns  Assist level: Supervision/Verbal cueing Assistive device: Walker-rolling    Walk 10 feet on uneven  surface  activity   Assist Walk 10 feet on uneven surfaces activity did not occur: Safety/medical concerns         Wheelchair     Assist Will patient use wheelchair at discharge?: No(anticipate pt will be primary ambulator at d/c)   Wheelchair activity did not occur: N/A  Wheelchair assist level: Supervision/Verbal cueing Max wheelchair distance: 150 ft     Wheelchair 50 feet with 2 turns activity    Assist    Wheelchair 50 feet with 2 turns activity did not occur: N/A   Assist Level: Supervision/Verbal cueing   Wheelchair 150 feet activity     Assist Wheelchair 150 feet activity did not occur: N/A   Assist Level: Supervision/Verbal cueing      Medical Problem List and Plan: 1.Right side weakness and aphasiasecondary to left MCA infarction --Continue CIR therapies including PT, OT, and SLP, Team conference today please see physician documentation under team conference tab, met with team face-to-face to discuss problems,progress, and goals. Formulized individual treatment plan based on medical history, underlying problem and comorbidities.  -continued pt/family ed   2. DVT Prophylaxis/Anticoagulation: SCDs. Venous Doppler studies negative 3. Pain Management:Tylenol as needed 4. Mood:Provide emotional support and positive reinforcement 5. Neuropsych: This patientiscapable of making decisions on herown behalf. 6. Skin/Wound Care:Routine skin checks 7. Fluids/Electrolytes/Nutrition:Routine in and outs with follow-up chemistries Mild HypoKalemia- supplement po 8.Hypertension. Permissive hypertension. Patient on Lopressor 100 mg twice daily prior to admission question medical compliance.     Vitals:   04/11/18 2003 04/12/18 0335  BP: (!) 153/83 124/83  Pulse: (!) 106 (!) 101  Resp: 18 18  Temp: (!) 97.4 F (36.3 C) 97.7 F (36.5 C)  SpO2: 100% 97%  Controlled 10/30 9.History of epilepsy/seizure disorder as well as work-up  for possible MS. No current seizure medication. Plan follow-up outpatient neurology services as patient has been seen by Dr. Tomi Likens in the past. LP results 04/06/2018 unremarkable- work up for MS negative thus far, white matter lesions c/w SVD  No seizure since rehab admit 10.Hyperlipidemia. Lipitor 11.  Microcytic anemia improving 12.  Epigastric tenderness as well as musculoskeletal CP, will order H2 blocker and sportscreme LOS: 5 days A FACE TO FACE EVALUATION WAS PERFORMED  Charlett Blake 04/12/2018, 8:31 AM

## 2018-04-12 NOTE — Consult Note (Signed)
Neuropsychological Consultation   Patient:   Tina Moss   DOB:   [birth year]  MR Number:  [id number]  Location:  [facility] [id number] [place] [address] Dept: [place]: [phone]           Date of Service:   04/12/2018  Start Time:   11 AM End Time:   12 PM  Provider/Observer:  Ilean Skill, Psy.D.       Clinical Neuropsychologist       Billing Code/Service: [phone] 4 Units  Chief Complaint:    Tina Moss is a 47 year old female with history of epilepsy, hypertension, hyperlipidemia.  The patient reports that she had been generally seizure free for 4-5 years but does report one seizure around 2 years ago.  Patient had been medically noncompliant due to financial issues.  There had been concerns about MS with rule out attempts and labs do not indicate MS.  Presented 04/04/2018 with right sided weakness and aphasia.  Imagining showed small area of cytotoxic edema in the left MCA M4 segment without hemorrhage.  MRI showed small acute posterior left frontal lobe infarction MCA territory.  Patient today continued to show some expressive language issues related to word finding difficulties, fluency issues and circumlocution.  No paraphasic errors noted.  Mood was reported to be without significant symptoms of depression or anxiety and mental status was mildly impaired due to slowed information processing speed and word finding difficulties.       Reason for Service:  Tina Moss was referred for neuropsychological consultation due to coping and adjustment issues and assess expressive language.  Below is the HPI for the current admission.    VZD:GLOVFIE Tina Moss is a 47 year old right-handed female with history of epilepsy, hypertension, hyperlipidemia and seizure disorder in the past and seizure free for 4-5 years with medical noncompliance due to financial issues. She has seen  Dr. Gwenlyn Found the past concerning for MS but no LP was done. Presented 04/04/2018 with right-sided weakness and aphasia. Per chart review patient lives with boyfriend. 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 One level home. Cranial CT scan showed small area of cytotoxic edema in the left MCA M4 segment without hemorrhage or mass-effect. Underlying chronic bilateral cerebral white matter disease. CT angiogram of head and neck negative for large vessel occlusion. Patient did not receive TPA. Urine drug screen negative. 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 Negative head MRA. Venous Doppler studies lower extremity negative. Echocardiogram with ejection fraction 65% no wall motion abnormalities. TEE normal LV function, negative saline micro cavitation study/no PFO. An LP was completed 04/06/2018 to assess for CNS pathology as she had seen neurology in the past for questionable MS but no work-up was initiated at that time and results remain pending. Currently on aspirin/Plavixfor CVA prophylaxis. Therapy evaluations completed with recommendations of physical medicine rehab consult. Patient was admitted for a comprehensive rehab program.  Current Status:  Patient today continued to show some expressive language issues related to word finding difficulties, fluency issues and circumlocution.  No paraphasic errors noted.  Mood was reported to be without signficant symptoms of depression or anxeity and mental status was mildly impaired due to slowed information processing speed and word finding difficulties.  Behavioral Observation: Tina Moss  presents as a 47 y.o.-year-old Right African American Female who appeared her stated age. her dress was Appropriate and she was  Well Groomed and her manners were Appropriate to the situation.  her participation was indicative of Appropriate, Inattentive and Redirectable behaviors.  There were any physical  disabilities noted.  she displayed an appropriate level of cooperation and motivation.     Interactions:    Active Appropriate, Inattentive and Redirectable  Attention:   abnormal and attention span appeared shorter than expected for age  Memory:   abnormal; remote memory intact, recent memory impaired  Visuo-spatial:  not examined  Speech (Volume):  low  Speech:   non-fluent aphasia; slowed word finding but improving from symptoms post seizure.  Thought Process:  Coherent and Relevant  Though Content:  WNL; not suicidal and not homicidal  Orientation:   person, place, time/date and situation  Judgment:   Fair  Planning:   Fair  Affect:    Flat and Lethargic  Mood:    Dysphoric  Insight:   Fair  Intelligence:   normal  Medical History:   Past Medical History:  Diagnosis Date  . Anemia   . Epilepsy ([place])   . Fibroids   . H/O bacterial infection   . H/O mumps   . Hyperlipidemia   . Hypertension   . Seizures ([place])    Psychiatric History:  Patient denies past history of psychiatric symptoms.    Family Med/Psych History:  Family History  Problem Relation Age of Onset  . Hypertension Mother   . Hypertension Sister   . Arthritis Sister        knees  . Hypertension Brother   . Deafness Brother   . Speech disorder Brother        mute  . Mental illness Brother        "he can just fly off"  . Mental illness Daughter        ? bipolar  . Scoliosis Son     Risk of Suicide/Violence: low Patient denies SI or HI.  Impression/DX:  Tina Moss is a 47 year old female with history of epilepsy, hypertension, hyperlipidemia.  The patient reports that she had been generally seizure free for 4-5 years but does report one seizure around 2 years ago.  Patient had been medically noncompliant due to financial issues.  There had been concerns about MS with rule out attempts and labs do not indicate MS.  Presented 04/04/2018 with right sided weakness and aphasia.  Imagining  showed small area of cytotoxic edema in the left MCA M4 segment without hemorrhage.  MRI showed small acute posterior left frontal lobe infarction MCA territory.  Patient today continued to show some expressive language issues related to word finding difficulties, fluency issues and circumlocution.  No paraphasic errors noted.  Mood was reported to be without significant symptoms of depression or anxiety and mental status was mildly impaired due to slowed information processing speed and word finding difficulties.   Patient today continued to show some expressive language issues related to word finding difficulties, fluency issues and circumlocution.  No paraphasic errors noted.  Mood was reported to be without signficant symptoms of depression or anxeity and mental status was mildly impaired due to slowed information processing speed and word finding difficulties.   Disposition/Plan:  Worked on coping and adjustment issues today and will see the patient next week for follow-up.    Diagnosis:    Improving expressive aphasia        Electronically Signed   _______________________ Ilean Skill, Psy.D.

## 2018-04-12 NOTE — Progress Notes (Signed)
Social Work Patient ID: Tina Moss, female   DOB: 09/08/1970, 47 y.o.   MRN: [id number] Met whit pt to discuss team conference goals of mod/i level and target discharge date 11/5. She is working hard and can see the progress she is making she wants to make sure she is ready to go home due to her boyfriend works and is not there and she wants to get back to work. Will await team's recommendations for equipment and work on follow up needs.

## 2018-04-12 NOTE — Progress Notes (Signed)
Occupational Therapy Session Note  Patient Details  Name: Tina Moss MRN: [id number] Date of Birth: Jul 25, 1970  Today's Date: 04/12/2018 OT Individual Time: 0930-1030 OT Individual Time Calculation (min): 60 min    Short Term Goals: Week 1:  OT Short Term Goal 1 (Week 1): Pt will maintain dynamic standing balance during LB self care with supervision assist  OT Short Term Goal 2 (Week 1): Pt will complete toilet transfer with supervision using LRAD OT Short Term Goal 3 (Week 1): Pt will complete 3/3 components of toileting with supervision   Skilled Therapeutic Interventions/Progress Updates:    Treatment session with focus on dynamic standing balance and endurance.  Pt with no c/o pain during session, but described episodes of chest pain - MD aware.  Pt ambulated to therapy gym with RW with supervision.  Engaged in dynamic standing activity to include reaching across midline, outside of BOS, and down towards floor to challenge balance reactions and endurance.  Supervision to min guard as pt fatigued.  Incorporated cognitive challenge with sorting items and sequencing with cards while completing dynamic standing activity.  Pt tolerated standing up to 8 mins before requiring seated rest break.  Educated on carryover of tasks to functional activities with pt reporting understand.   Pt ambulated 20' without AD while carrying box with ~8lb of beanbags to simulate work load and challenge balance and endurance.  Min guard with ambulation without AD.  Returned to room ambulating with RW with supervision.  Pt reports need to toilet.  Completed toilet transfer and toileting with distant supervision.  Pt left upright in w/c with all needs in reach and chair alarm on.  Therapy Documentation Precautions:  Precautions Precautions: Fall Restrictions Weight Bearing Restrictions: No Pain: Pain Assessment Pain Scale: 0-10 Pain Score: 0-No pain   Therapy/Group: Individual Therapy  Evie Crumpler,  [place] 04/12/2018, 12:12 PM

## 2018-04-13 ENCOUNTER — Inpatient Hospital Stay (HOSPITAL_COMMUNITY): Payer: Self-pay | Admitting: Occupational Therapy

## 2018-04-13 ENCOUNTER — Inpatient Hospital Stay (HOSPITAL_COMMUNITY): Payer: Self-pay | Admitting: Speech Pathology

## 2018-04-13 ENCOUNTER — Inpatient Hospital Stay (HOSPITAL_COMMUNITY): Payer: Self-pay

## 2018-04-13 NOTE — Plan of Care (Signed)
  Problem: Consults Goal: RH STROKE PATIENT EDUCATION Description See Patient Education module for education specifics  Outcome: Progressing   Problem: RH SAFETY Goal: RH STG ADHERE TO SAFETY PRECAUTIONS W/ASSISTANCE/DEVICE Description STG Adhere to Safety Precautions With supervision Assistance/Device.  Outcome: Progressing   Problem: RH KNOWLEDGE DEFICIT Goal: RH STG INCREASE KNOWLEDGE OF HYPERTENSION Description Pt and family will be able to state understanding of medications and diet restrictions to manage HTN using resources independently  Outcome: Progressing Goal: RH STG INCREASE KNOWLEGDE OF HYPERLIPIDEMIA Description Pt will be able to state understanding of management of HLD using medications and dietary restrictions independently using resources provided  Outcome: Progressing Goal: RH STG INCREASE KNOWLEDGE OF STROKE PROPHYLAXIS Description Pt will be able to state stroke prophylaxis and rationale for use independently using resources/cues provided  Outcome: Progressing

## 2018-04-13 NOTE — Progress Notes (Signed)
Occupational Therapy Session Note  Patient Details  Name: Tina Moss MRN: [id number] Date of Birth: 1971/03/17  Today's Date: 04/13/2018 OT Individual Time: 9977-4142 and 3953-2023 OT Individual Time Calculation (min): 45 min and 45 min   Short Term Goals: Week 1:  OT Short Term Goal 1 (Week 1): Pt will maintain dynamic standing balance during LB self care with supervision assist  OT Short Term Goal 2 (Week 1): Pt will complete toilet transfer with supervision using LRAD OT Short Term Goal 3 (Week 1): Pt will complete 3/3 components of toileting with supervision   Skilled Therapeutic Interventions/Progress Updates:    1) Treatment session with focus on dynamic standing balance and endurance during self-care tasks of bathing and dressing.  Pt ambulated to toilet and completed toileting with RW with supervision.  Ambulated from toilet to shower seat in room shower with RW with supervision.  Pt completed bathing at sit > stand level from shower seat for endurance.  Min guard when drying her lower legs in standing, therapist educated on sitting to wash and dry lower legs for safety as well as energy conservation.  Pt completed dressing at sit > stand level with increased time due to decreased endurance.  Pt left upright in w/c with all needs in reach.  2) Treatment session with focus on functional mobility and endurance.  Pt requesting to go off the unit.  Therapist pushed w/c to gift shop for time management.  Pt ambulated throughout gift shop with RW with supervision, pt demonstrating good awareness of obstacles without any LOB or bumping in to items.  Pt able to bend down to inspect objects on lower shelves with supervision and no LOB.  Pt ambulated outside with RW with supervision.  Engaged in discussion regarding current level of recovery and pt goals going forward as well as long term goals.  Pt motivated and encouraged pt progress.  Pt also able to verbalize communication strategies, and  having minimal issues with word finding due to ease of conversation.  Pt returned to room and left upright in w/c with all needs in reach awaiting PT.  Therapy Documentation Precautions:  Precautions Precautions: Fall Restrictions Weight Bearing Restrictions: No General:   Vital Signs: Therapy Vitals Temp: (!) 97.5 F (36.4 C) Pulse Rate: 79 Resp: 18 BP: 127/84 Patient Position (if appropriate): Sitting Oxygen Therapy SpO2: 100 % O2 Device: Room Air Pain: Pain Assessment Pain Scale: 0-10 Pain Score: 0-No pain   Therapy/Group: Individual Therapy  Simonne Come 04/13/2018, 9:24 AM

## 2018-04-13 NOTE — Progress Notes (Signed)
[place] PHYSICAL MEDICINE & REHABILITATION PROGRESS NOTE   Subjective/Complaints:  No issues overnite    ROS: Patient denies bowel or bladder problems, breathing issues   Objective:   No results found. No results for input(s): WBC, HGB, HCT, PLT in the last 72 hours. No results for input(s): NA, K, CL, CO2, GLUCOSE, BUN, CREATININE, CALCIUM in the last 72 hours.  Intake/Output Summary (Last 24 hours) at 04/13/2018 0820 Last data filed at 04/12/2018 1900 Gross per 24 hour  Intake 120 ml  Output -  Net 120 ml     Physical Exam: Vital Signs Blood pressure 127/84, pulse 79, temperature (!) 97.5 F (36.4 C), resp. rate 18, height 5\' 7"  (1.702 m), weight 85.6 kg, last menstrual period 03/21/2018, SpO2 100 %. Constitutional: No distress . Vital signs reviewed. HEENT: EOMI, oral membranes moist Neck: supple Cardiovascular: RRR without murmur. No JVD    Respiratory: CTA Bilaterally without wheezes or rales. Normal effort    GI: BS +,epigastric tenderness, non-distended  Musculoskeletal:Normal range of motion.No tenderness Neurological: She isalertand oriented to person, place, and time. Patient is expressively aphasic/apraxic. Marland Kitchen Speech non-fluent but can communicate ideas. RUE and RLE HP   4-/5 with apraxia. LUE and LLE 4+ to 5/5---stable  Skin: Skin iswarmand dry.  Psychiatric: She has anormal mood and affect. Herbehavior is normal    Assessment/Plan: 1. 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  Physiatrist is providing close team supervision and 24 hour management of active medical problems listed below.  Physiatrist and rehab team continue to assess barriers to discharge/monitor patient progress toward functional and medical goals  Care Tool:  Bathing    Body parts bathed by patient: Right arm, Left arm, Chest, Abdomen, Front perineal area, Buttocks,  Right upper leg, Left upper leg, Left lower leg, Face, Right lower leg(bathed independently at bed level)   Body parts bathed by helper: Right lower leg     Bathing assist Assist Level: Set up assist     Upper Body Dressing/Undressing Upper body dressing   What is the patient wearing?: Pull over shirt, Bra    Upper body assist Assist Level: Set up assist    Lower Body Dressing/Undressing Lower body dressing      What is the patient wearing?: Pants     Lower body assist Assist for lower body dressing: Contact Guard/Touching assist     Toileting Toileting Toileting Activity did not occur (Clothing management and hygiene only): N/A (no void or bm)  Toileting assist Assist for toileting: Supervision/Verbal cueing     Transfers Chair/bed transfer  Transfers assist     Chair/bed transfer assist level: Supervision/Verbal cueing     Locomotion Ambulation   Ambulation assist      Assist level: Supervision/Verbal cueing Assistive device: Walker-rolling Max distance: 150 ft   Walk 10 feet activity   Assist     Assist level: Supervision/Verbal cueing Assistive device: Walker-rolling   Walk 50 feet activity   Assist    Assist level: Supervision/Verbal cueing Assistive device: Walker-rolling    Walk 150 feet activity   Assist Walk 150 feet activity did not occur: Safety/medical concerns  Assist level: Supervision/Verbal cueing Assistive device: Walker-rolling    Walk 10 feet on uneven surface  activity   Assist Walk 10 feet on uneven surfaces activity did not occur: Safety/medical concerns         Wheelchair     Assist  Will patient use wheelchair at discharge?: No(anticipate pt will be primary ambulator at d/c)   Wheelchair activity did not occur: N/A  Wheelchair assist level: Supervision/Verbal cueing Max wheelchair distance: 150 ft     Wheelchair 50 feet with 2 turns activity    Assist    Wheelchair 50 feet with 2 turns  activity did not occur: N/A   Assist Level: Supervision/Verbal cueing   Wheelchair 150 feet activity     Assist Wheelchair 150 feet activity did not occur: N/A   Assist Level: Supervision/Verbal cueing      Medical Problem List and Plan: 1.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 DVT Prophylaxis/Anticoagulation: SCDs. Venous Doppler studies negative 3. Pain Management:Tylenol as needed 4. Mood:Provide emotional support and positive reinforcement 5. Neuropsych: This patientiscapable of making decisions on herown behalf. 6. Skin/Wound Care:Routine skin checks 7. Fluids/Electrolytes/Nutrition:Routine in and outs with follow-up chemistries Mild HypoKalemia- supplement po 8.Hypertension. Permissive hypertension. Patient on Lopressor 100 mg twice daily prior to admission question medical compliance.     Vitals:   04/12/18 1944 04/13/18 0617  BP: 127/82 127/84  Pulse: 87 79  Resp: 18 18  Temp:  (!) 97.5 F (36.4 C)  SpO2: 100% 100%  Controlled 10/31 9.History of epilepsy/seizure disorder as well as work-up for possible MS. No current seizure medication. Plan follow-up outpatient neurology services as patient has been seen by Dr. Tomi Likens in the past. LP results 04/06/2018 unremarkable- work up for MS negative thus far, white matter lesions c/w SVD  No seizure since rehab admit 10.Hyperlipidemia. Lipitor 11.  Microcytic anemia improving 12.  Epigastric tenderness as well as musculoskeletal CP, will order H2 blocker and sportscreme Epigastric pain improved but had one 4-21min episode of CP this am during vitals, no SOB or diaphoresis, no pain during showering, unlikely to be cardiac but will check EKG and troponin since pt no longer has parasternal tenderness LOS: 6 days A FACE TO FACE EVALUATION WAS PERFORMED  Charlett Blake 04/13/2018, 8:20 AM

## 2018-04-13 NOTE — Progress Notes (Addendum)
Physical Therapy Session Note  Patient Details  Name: Tina Moss MRN: [id number] Date of Birth: [birth year]  Today's Date: 04/13/2018 PT Individual Time: 1345-1430 PT Individual Time Calculation (min): 45 min   Short Term Goals: Week 1:  PT Short Term Goal 1 (Week 1): Pt will transfer bed<>chair w/ supervision PT Short Term Goal 2 (Week 1): Pt will ambulate 26' in household environment w/ LRAD and supervision PT Short Term Goal 3 (Week 1): Pt will maintain dynamic standing balance w/ supervision PT Short Term Goal 4 (Week 1): Pt will tolerate 30 min of OOB activity w/o increase in fatigue  Skilled Therapeutic Interventions/Progress Updates:   Pt sitting up in w/c.  Gait training throughout unit with RW, supervision.  Sustained stretch bil LEs heel cords and hamstrings, and balance challenge, standing with forefeet on wedge and bil UE support on RW, x 30 seconds x 1, x 1.5 minutes x 1.      Standing neuromuscular re-education via demo, hand-out and mulitmodal cues for 8-10 x 1 each R/L hip abduction, R/L hamstring curls, mini squats.  Pt required 1 seated rest break.  Heel/toe raises, x 3; pt had to sit after 3 heel/toe raises due to muscle fatigue. Pt issued hand-out.    Instructed pt in self ROM bil heel cords and hamstrings using standing runner's stretch, with bil UE support on counter.  Gait with RW to return to room.  10 MWT - 20 seconds; 1.64'/second- neighborhood ambulator.  Pt left resting in w/c with needs at hand; PT reviewed use of call bell and need to call for assistance to get up..    Therapy Documentation Precautions:  Precautions Precautions: Fall Restrictions Weight Bearing Restrictions: No Pain: none per pt      Therapy/Group: Individual Therapy  Shmiel Morton 04/13/2018, 3:16 PM

## 2018-04-13 NOTE — Progress Notes (Signed)
Speech Language Pathology Daily Session Note  Patient Details  Name: Tina Moss MRN: [id number] Date of Birth: 1971/01/23  Today's Date: 04/13/2018   Skilled treatment session SLP Individual Time: 2197-5883 SLP Individual Time Calculation (min): 60 min   Short Term Goals: Week 1: SLP Short Term Goal 1 (Week 1): Pt will utilization word finding strategies to convey semi-complex information with supervision cues.  SLP Short Term Goal 2 (Week 1): Pt will self-correct word finding errors with supervision cues.   Skilled Therapeutic Interventions:  Skilled treatment session focused on communication goals and education. SLP facilitated session by providing intermittent verbal cues for word finding within complex trick or treating activity at day care center. Pt able to communicate complex thoughts and convey questions with mild phonemic paraphasias. Pt able to communicate during unprepared social interaction with strangers. Pt with good overall awareness of deficits within activity (both physical and communication) and able to problem solve, reflect on what was helpful and unhelpful. Pt returned to room, left upright in wheelchair, chair alarm on and all needs within reach.   Skilled treatment session focused on communication and further education. SLP facilitated session by taking pt to cafeteria. She recognized one of the workers and communicated with her beautifully with no evidence of word finding deficits. Pt also able to order her food items, pay and greet various other people. Pt with greatly reduced phonemic paraphasias. After pt returned to room, pt able to reflect on ability and discussed helpful strategies. Pt was left upright in wheelchair, chair alarm on and all needs within reach. Continue per current plan of care.      Pain Pain Assessment Pain Scale: 0-10 Pain Score: 0-No pain  Therapy/Group: Individual Therapy  Rene Sizelove 04/13/2018, 11:14 AM

## 2018-04-14 ENCOUNTER — Inpatient Hospital Stay (HOSPITAL_COMMUNITY): Payer: Self-pay | Admitting: Speech Pathology

## 2018-04-14 ENCOUNTER — Inpatient Hospital Stay (HOSPITAL_COMMUNITY): Payer: Self-pay | Admitting: Occupational Therapy

## 2018-04-14 ENCOUNTER — Inpatient Hospital Stay (HOSPITAL_COMMUNITY): Payer: Self-pay | Admitting: Physical Therapy

## 2018-04-14 NOTE — Discharge Instructions (Signed)
Inpatient Rehab Discharge Instructions  Tina Moss Discharge date and time: No discharge date for patient encounter.   Activities/Precautions/ Functional Status: Activity: activity as tolerated Diet: regular diet Wound Care: none needed Functional status:  ___ No restrictions     ___ Walk up steps independently ___ 24/7 supervision/assistance   ___ Walk up steps with assistance ___ Intermittent supervision/assistance  ___ Bathe/dress independently ___ Walk with walker     _x__ Bathe/dress with assistance ___ Walk Independently    ___ Shower independently ___ Walk with assistance    ___ Shower with assistance ___ No alcohol     ___ Return to work/school ________  Special Instructions: No driving  Aspirin and Plavix x3 weeks then aspirin alone   COMMUNITY REFERRALS UPON DISCHARGE:    Home Health:   PT, OT, [place]   Date of last service:04/19/2018  Medical Equipment/Items Ordered:ROLLING WALKER, [place]   [phone]  [facility] GIVEN FOR PRESCRIPTION ASSISTANCE  GENERAL COMMUNITY RESOURCES FOR PATIENT/FAMILY: Support Groups:[facility] SUPPORT GROUP THE SECOND Thursday OF EACH MONTH @ 6;00-7:00 PM ON THE REHAB UNIT QUESTIONS CALL [phone]  STROKE/TIA DISCHARGE INSTRUCTIONS SMOKING Cigarette smoking nearly doubles your risk of having a stroke & is the single most alterable risk factor  If you smoke or have smoked in the last 12 months, you are advised to quit smoking for your health.  Most of the excess cardiovascular risk related to smoking disappears within a year of stopping.  Ask you doctor about anti-smoking medications  [place] Quit Line: 1-800-QUIT NOW  Free Smoking Cessation Classes [phone]  CHOLESTEROL Know your levels; limit fat & cholesterol in your diet  Lipid Panel     Component Value Date/Time   CHOL 206 (H) 04/05/2018 0535   TRIG 63 04/05/2018 0535     HDL 72 04/05/2018 0535   CHOLHDL 2.9 04/05/2018 0535   VLDL 13 04/05/2018 0535   LDLCALC 121 (H) 04/05/2018 0535      Many patients benefit from treatment even if their cholesterol is at goal.  Goal: Total Cholesterol (CHOL) less than 160  Goal:  Triglycerides (TRIG) less than 150  Goal:  HDL greater than 40  Goal:  LDL (LDLCALC) less than 100   BLOOD PRESSURE American Stroke Association blood pressure target is less that 120/80 mm/Hg  Your discharge blood pressure is:  BP: 119/77  Monitor your blood pressure  Limit your salt and alcohol intake  Many individuals will require more than one medication for high blood pressure  DIABETES (A1c is a blood sugar average for last 3 months) Goal HGBA1c is under 7% (HBGA1c is blood sugar average for last 3 months)  Diabetes: No known diagnosis of diabetes    Lab Results  Component Value Date   HGBA1C 5.9 (H) 04/05/2018     Your HGBA1c can be lowered with medications, healthy diet, and exercise.  Check your blood sugar as directed by your physician  Call your physician if you experience unexplained or low blood sugars.  PHYSICAL ACTIVITY/REHABILITATION Goal is 30 minutes at least 4 days per week  Activity: Increase activity slowly, Therapies: Physical Therapy: Home Health Return to work:   Activity decreases your risk of heart attack and stroke and makes your heart stronger.  It helps control your weight and blood pressure; helps you relax and can improve your mood.  Participate in a regular exercise program.  Talk with your doctor about the  best form of exercise for you (dancing, walking, swimming, cycling).  DIET/WEIGHT Goal is to maintain a healthy weight  Your discharge diet is:  Diet Order            Diet Heart Room service appropriate? Yes; Fluid consistency: Thin  Diet effective now              liquids Your height is:  Height: 5\' 7"  (170.2 cm) Your current weight is: Weight: 85.6 kg Your Body Mass Index (BMI)  is:  BMI (Calculated): 29.55  Following the type of diet specifically designed for you will help prevent another stroke.  Your goal weight range is:    Your goal Body Mass Index (BMI) is 19-24.  Healthy food habits can help reduce 3 risk factors for stroke:  High cholesterol, hypertension, and excess weight.  RESOURCES Stroke/Support Group:  Call [phone]   STROKE EDUCATION PROVIDED/REVIEWED AND GIVEN TO PATIENT Stroke warning signs and symptoms How to activate emergency medical system (call 911). Medications prescribed at discharge. Need for follow-up after discharge. Personal risk factors for stroke. Pneumonia vaccine given:  Flu vaccine given:  My questions have been answered, the writing is legible, and I understand these instructions.  I will adhere to these goals & educational materials that have been provided to me after my discharge from the hospital.      My questions have been answered and I understand these instructions. I will adhere to these goals and the provided educational materials after my discharge from the hospital.  Patient/Caregiver Signature _______________________________ Date __________  Clinician Signature _______________________________________ Date __________  Please bring this form and your medication list with you to all your follow-up doctor's appointments.

## 2018-04-14 NOTE — Progress Notes (Signed)
Speech Language Pathology Weekly Progress and Session Note  Patient Details  Name: Tina Moss MRN: [id number] Date of Birth: 10/16/1970  Beginning of progress report period: April 08, 2018 End of progress report period: April 14, 2018  Today's Date: 04/14/2018 SLP Individual Time: 0915-1000 SLP Individual Time Calculation (min): 45 min  Short Term Goals: Week 1: SLP Short Term Goal 1 (Week 1): Pt will utilization word finding strategies to convey semi-complex information with supervision cues.  SLP Short Term Goal 1 - Progress (Week 1): Met SLP Short Term Goal 2 (Week 1): Pt will self-correct word finding errors with supervision cues.  SLP Short Term Goal 2 - Progress (Week 1): Met    New Short Term Goals: Week 2: SLP Short Term Goal 1 (Week 2): Pt will communicate complex abstract thoughts with Mod I.   Weekly Progress Updates: Pt has made great progress this reporting period and as a result has met 2 of 2 STGs. Pt demonstrates the ability to communicate complex information with decreased errors in word finding but continues to exhibit some phonemic paraphasias with other therapists while attempting to perform physical or motoric tasks. Would not recommend any follow up ST at discharge.      Intensity: Minumum of 1-2 x/day, 30 to 90 minutes Frequency: 3 to 5 out of 7 days Duration/Length of Stay: 7 to 10 days Treatment/Interventions: Speech/Language facilitation;Patient/family education;Functional tasks   Daily Session  Skilled Therapeutic Interventions: Skilled treatment session focused on communicaiton and further education with pt regarding language recovery. SLP facilitated session by taking pt to [employer] and allowing pt to order for both Probation officer and herself. No evidence of any word finding deficits. Pt faced with unknown situations such as expressing appreciation for free coffee to [employer] staff, interacting and conversing with strangers in elevator. Pt with great progress  towards goods and is largely Mod I. Pt returned ot room, left upright in wheelchair with daughter present and all needs within reach.      General    Pain Pain Assessment Pain Scale: 0-10 Pain Score: 0-No pain  Therapy/Group: Individual Therapy  Nealy Hickmon 04/14/2018, 4:07 PM

## 2018-04-14 NOTE — Progress Notes (Signed)
Physical Therapy Session Note  Patient Details  Name: Tina Moss MRN: [id number] Date of Birth: 1970-08-27  Today's Date: 04/14/2018 PT Individual Time: 0806-0903 PT Individual Time Calculation (min): 57 min   Short Term Goals: Week 1:  PT Short Term Goal 1 (Week 1): Pt will transfer bed<>chair w/ supervision PT Short Term Goal 2 (Week 1): Pt will ambulate 26' in household environment w/ LRAD and supervision PT Short Term Goal 3 (Week 1): Pt will maintain dynamic standing balance w/ supervision PT Short Term Goal 4 (Week 1): Pt will tolerate 30 min of OOB activity w/o increase in fatigue  Skilled Therapeutic Interventions/Progress Updates:  Pt received in w/c & agreeable to tx. Pt denies c/o pain. Pt ambulates room>gym with RW & supervision, pt appears distracted and with decreased attention to RW when conversing during gait & therapist educated pt on this. In gym, pt ambulates 100 ft + 25 ft  without AD with minimal reciprocal arm swing and steady assist with decreased gait speed but no LOB noted. Gait with LBQC with CGA>min assist x 40 ft with cuing for sequencing as pt appears to become anxious during task and demonstrates impaired balance. Educated pt that current recommendation is to use RW & pt is in agreement. Pt performed side stepping at rail in hallway x 2 with BUE support and supervision with cuing for technique with task focusing on LE strengthening; during task pt reports cramp from neck to buttocks but reports it ceases with rest & MD made aware. Pt stood on small blue wedge with min assist for balance without UE support while correctly assembling simple peg board design from choice of many with task focusing on heel cord stretching and balance. Pt ambulates back to room with RW & supervision & is left sitting in w/c with NT in room.   Therapy Documentation Precautions:  Precautions Precautions: Fall Restrictions Weight Bearing Restrictions: No    Therapy/Group:  Individual Therapy  Waunita Schooner 04/14/2018, 9:06 AM

## 2018-04-14 NOTE — Progress Notes (Signed)
Occupational Therapy Session Note  Patient Details  Name: Tina Moss MRN: [id number] Date of Birth: 27-Feb-1971  Today's Date: 04/14/2018 OT Individual Time: 1624-4695 OT Individual Time Calculation (min): 43 min    Short Term Goals: Week 1:  OT Short Term Goal 1 (Week 1): Pt will maintain dynamic standing balance during LB self care with supervision assist  OT Short Term Goal 2 (Week 1): Pt will complete toilet transfer with supervision using LRAD OT Short Term Goal 3 (Week 1): Pt will complete 3/3 components of toileting with supervision   Skilled Therapeutic Interventions/Progress Updates:    Pt presents sitting up in w/c agreeable to therapy session. Pt ambulates to rehab gym during session using RW with CGA-supervision. In therapy gym pt participating in standing tabletop activities with focus on LB strengthening, standing activity tolerance, reaching across midline. Pt performing x1 peg board activity and x2 puzzles while standing, taking seated rest breaks between each. Pt standing on level surface with close supervision using single UE support. During completion of second puzzle pt stood on blue foam wedge for increased balance challenge, completing with overall CGA and with single UE support. Pt with reports of increased stiffness/soreness in neck and shoulders, completed gentle stretching and shoulder rolls forward/back to release muscle tension while seated on mat table. After seated rest break pt returned to room in manner described above. Pt was left seated in w/c end of session with daughter present, call bell and needs within reach.    Therapy Documentation Precautions:  Precautions Precautions: Fall Restrictions Weight Bearing Restrictions: No   Therapy/Group: Individual Therapy  Raymondo Band 04/14/2018, 8:15 AM

## 2018-04-14 NOTE — Progress Notes (Signed)
[place] PHYSICAL MEDICINE & REHABILITATION PROGRESS NOTE   Subjective/Complaints:  Per PT amb 150' with walker , no further CP or abd pain   ROS: Patient denies bowel or bladder problems, breathing issues   Objective:   No results found. No results for input(s): WBC, HGB, HCT, PLT in the last 72 hours. No results for input(s): NA, K, CL, CO2, GLUCOSE, BUN, CREATININE, CALCIUM in the last 72 hours.  Intake/Output Summary (Last 24 hours) at 04/14/2018 0845 Last data filed at 04/13/2018 2300 Gross per 24 hour  Intake 960 ml  Output -  Net 960 ml     Physical Exam: Vital Signs Blood pressure 109/80, pulse 77, temperature 98.2 F (36.8 C), resp. rate 18, height 5\' 7"  (1.702 m), weight 85.6 kg, last menstrual period 03/21/2018, SpO2 100 %. Constitutional: No distress . Vital signs reviewed. HEENT: EOMI, oral membranes moist Neck: supple Cardiovascular: RRR without murmur. No JVD    Respiratory: CTA Bilaterally without wheezes or rales. Normal effort    GI: BS +,epigastric tenderness, non-distended  Musculoskeletal:Normal range of motion.No tenderness Neurological: She isalertand oriented to person, place, and time. Patient is expressively aphasic/apraxic. Marland Kitchen Speech non-fluent but can communicate ideas. RUE and RLE HP   4-/5 with apraxia. LUE and LLE 4+ to 5/5---stable  Skin: Skin iswarmand dry.  Psychiatric: She has anormal mood and affect. Herbehavior is normal    Assessment/Plan: 1. 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  Physiatrist is providing close team supervision and 24 hour management of active medical problems listed below.  Physiatrist and rehab team continue to assess barriers to discharge/monitor patient progress toward functional and medical goals  Care Tool:  Bathing    Body parts bathed by patient: Right arm, Left arm, Chest, Abdomen,  Front perineal area, Buttocks, Right upper leg, Left upper leg, Left lower leg, Face, Right lower leg   Body parts bathed by helper: Right lower leg     Bathing assist Assist Level: Set up assist     Upper Body Dressing/Undressing Upper body dressing   What is the patient wearing?: Pull over shirt, Bra    Upper body assist Assist Level: Set up assist    Lower Body Dressing/Undressing Lower body dressing      What is the patient wearing?: Pants, Underwear/pull up     Lower body assist Assist for lower body dressing: Supervision/Verbal cueing     Toileting Toileting Toileting Activity did not occur (Clothing management and hygiene only): N/A (no void or bm)  Toileting assist Assist for toileting: Supervision/Verbal cueing     Transfers Chair/bed transfer  Transfers assist     Chair/bed transfer assist level: Supervision/Verbal cueing     Locomotion Ambulation   Ambulation assist      Assist level: Supervision/Verbal cueing Assistive device: Walker-rolling Max distance: 150 ft   Walk 10 feet activity   Assist     Assist level: Supervision/Verbal cueing Assistive device: Walker-rolling   Walk 50 feet activity   Assist    Assist level: Supervision/Verbal cueing Assistive device: Walker-rolling    Walk 150 feet activity   Assist Walk 150 feet activity did not occur: Safety/medical concerns  Assist level: Supervision/Verbal cueing Assistive device: Walker-rolling    Walk 10 feet on uneven surface  activity   Assist Walk 10 feet on uneven surfaces activity did not occur: Safety/medical concerns         Wheelchair  Assist Will patient use wheelchair at discharge?: No(anticipate pt will be primary ambulator at d/c)   Wheelchair activity did not occur: N/A  Wheelchair assist level: Supervision/Verbal cueing Max wheelchair distance: 150 ft     Wheelchair 50 feet with 2 turns activity    Assist    Wheelchair 50 feet  with 2 turns activity did not occur: N/A   Assist Level: Supervision/Verbal cueing   Wheelchair 150 feet activity     Assist Wheelchair 150 feet activity did not occur: N/A   Assist Level: Supervision/Verbal cueing      Medical Problem List and Plan: 1.Right side weakness and aphasiasecondary to left MCA infarction --Continue CIR therapies including PT, OT, and SLP,   -continued pt/family ed  Target d/c 11/5 2. DVT Prophylaxis/Anticoagulation: SCDs. Venous Doppler studies negative 3. Pain Management:Tylenol as needed 4. Mood:Provide emotional support and positive reinforcement 5. Neuropsych: This patientiscapable of making decisions on herown behalf. 6. Skin/Wound Care:Routine skin checks 7. Fluids/Electrolytes/Nutrition:Routine in and outs with follow-up chemistries Mild HypoKalemia- supplement po 8.Hypertension. Permissive hypertension. Patient on Lopressor 100 mg twice daily prior to admission question medical compliance.     Vitals:   04/13/18 2000 04/14/18 0511  BP: 121/77 109/80  Pulse: 91 77  Resp: 18 18  Temp: 98.2 F (36.8 C)   SpO2: 96% 100%  Controlled 11/1 9.History of epilepsy/seizure disorder as well as work-up for possible MS. No current seizure medication. Plan follow-up outpatient neurology services as patient has been seen by Dr. Tomi Likens in the past. LP results 04/06/2018 unremarkable- work up for MS negative thus far, white matter lesions c/w SVD  No seizure since rehab admit 10.Hyperlipidemia. Lipitor 11.  Microcytic anemia improving 12.  Epigastric tenderness as well as musculoskeletal CP, will order H2 blocker and sportscreme Symptoms resolved no issues amb with PT this am, no further w/u indicatied at this time 7 days A FACE TO FACE EVALUATION WAS PERFORMED  Charlett Blake 04/14/2018, 8:45 AM

## 2018-04-14 NOTE — Progress Notes (Signed)
Occupational Therapy Session Note  Patient Details  Name: Tina Moss MRN: [id number] Date of Birth: 07-11-1970  Today's Date: 04/14/2018 OT Individual Time: 1455-1550 OT Individual Time Calculation (min): 55 min    Short Term Goals: Week 1:  OT Short Term Goal 1 (Week 1): Pt will maintain dynamic standing balance during LB self care with supervision assist  OT Short Term Goal 2 (Week 1): Pt will complete toilet transfer with supervision using LRAD OT Short Term Goal 3 (Week 1): Pt will complete 3/3 components of toileting with supervision   Skilled Therapeutic Interventions/Progress Updates:    Patient states "I was ready for you today".  She is pleasant and cooperative, eager to participate in therapy session.   Patient requested to go to store  - min cues for direction to locate store.  Patient propelled w/c approx 1/3 of distance.  She is independent with use of credit card and able to give and estimate cost of item prior to purchase.   Patient practiced hand writing skills and reviewed strategies for providing information in emergency situations and on the phone.  Hand writing is legible and neat - patient notes that it is not at baseline but improving.  She requires mod A to verbalize key pieces of information in the event that she needs to call 911.   Functional ambulation and use of walker bag - CS on unit with occ cues for carrying strategies.   Patient returned to room at end of session with nursing aware.    Therapy Documentation Precautions:  Precautions Precautions: Fall Restrictions Weight Bearing Restrictions: No General:   Vital Signs: Therapy Vitals Temp: 98 F (36.7 C) Temp Source: Oral Pulse Rate: 95 Resp: 18 BP: 121/82 Patient Position (if appropriate): Sitting Oxygen Therapy SpO2: 100 % O2 Device: Room Air Pain: Pain Assessment Pain Scale: 0-10 Pain Score: 0-No pain   Therapy/Group: Individual Therapy  Carlos Levering 04/14/2018, 4:16 PM

## 2018-04-15 ENCOUNTER — Inpatient Hospital Stay (HOSPITAL_COMMUNITY): Payer: Self-pay

## 2018-04-15 NOTE — Progress Notes (Signed)
Slept good during night, without complain of pain.Tina Moss A

## 2018-04-15 NOTE — Progress Notes (Signed)
Occupational Therapy Session Note  Patient Details  Name: LACRETIA TINDALL MRN: [id number] Date of Birth: 10-13-1970  Today's Date: 04/15/2018 OT Individual Time: 1586-8257 OT Individual Time Calculation (min): 28 min    Short Term Goals: Week 1:  OT Short Term Goal 1 (Week 1): Pt will maintain dynamic standing balance during LB self care with supervision assist  OT Short Term Goal 2 (Week 1): Pt will complete toilet transfer with supervision using LRAD OT Short Term Goal 3 (Week 1): Pt will complete 3/3 components of toileting with supervision   Skilled Therapeutic Interventions/Progress Updates:    1;1. Pt reporting pain in RLE after standing. Pt declines medication and rest provided. Pt completes ambualtion with RW and S to dayroom with VC for looking forward. Pt completes standing balance/tolerance activity with S-CGA on compliant red foam wedge to improve ankle strategy and weight shifting forward while making no sew blanket. Pt plays game of corn hole against OT while standing with RW with S for dynamic balance. Exited session with pt seated in w/c, family present to supervise and call light in reach  Therapy Documentation Precautions:  Precautions Precautions: Fall Restrictions Weight Bearing Restrictions: No General:   Vital Signs:   Pain: Pain Assessment Pain Scale: 0-10 Pain Score: 0-No pain ADL: ADL Eating: Not assessed Grooming: Not assessed Upper Body Bathing: Supervision/safety Where Assessed-Upper Body Bathing: Shower Lower Body Bathing: Minimal assistance Where Assessed-Lower Body Bathing: Shower Upper Body Dressing: Supervision/safety Where Assessed-Upper Body Dressing: Edge of bed Lower Body Dressing: Minimal assistance Where Assessed-Lower Body Dressing: Edge of bed Toileting: Not assessed Toilet Transfer: Not assessed Social research officer, government: Minimal assistance Social research officer, government Method: Heritage manager: Civil engineer, contracting with back,  Systems analyst    Praxis   Exercises:   Other Treatments:     Therapy/Group: Individual Therapy  Tonny Branch 04/15/2018, 2:28 PM

## 2018-04-15 NOTE — Progress Notes (Signed)
Tina Moss is a 47 y.o. female 1970-08-11 [id number]  Subjective: No new complaints. No new problems. Slept well. Feeling OK.  Objective: Vital signs in last 24 hours: Temp:  [98 F (36.7 C)-98.4 F (36.9 C)] 98.2 F (36.8 C) (11/02 0522) Pulse Rate:  [80-95] 80 (11/02 0522) Resp:  [18] 18 (11/02 0522) BP: (116-127)/(82-84) 116/84 (11/02 0522) SpO2:  [100 %] 100 % (11/02 0522) Weight change:  Last BM Date: 04/14/18  Intake/Output from previous day: 11/01 0701 - 11/02 0700 In: 1080 [P.O.:1080] Out: -  Last cbgs: CBG (last 3)  No results for input(s): GLUCAP in the last 72 hours.   Physical Exam General: No apparent distress.  In bed.  Aphasic HEENT: not dry Lungs: Normal effort. Lungs clear to auscultation, no crackles or wheezes. Cardiovascular: Regular rate and rhythm, no edema Abdomen: S/NT/ND; BS(+) Musculoskeletal:  unchanged Neurological: No new neurological deficits Wounds: N/A    Skin: clear   Mental state: Alert, oriented, cooperative    Lab Results: BMET    Component Value Date/Time   NA 137 04/10/2018 0550   NA 141 10/13/2016 1559   K 3.4 (L) 04/10/2018 0550   CL 105 04/10/2018 0550   CO2 26 04/10/2018 0550   GLUCOSE 109 (H) 04/10/2018 0550   BUN 10 04/10/2018 0550   BUN 11 10/13/2016 1559   CREATININE 0.61 04/10/2018 0550   CALCIUM 9.5 04/10/2018 0550   GFRNONAA >60 04/10/2018 0550   GFRAA >60 04/10/2018 0550   CBC    Component Value Date/Time   WBC 3.4 (L) 04/10/2018 0550   RBC 4.75 04/10/2018 0550   HGB 10.1 (L) 04/10/2018 0550   HGB 9.6 (L) 09/16/2016 1701   HCT 34.5 (L) 04/10/2018 0550   HCT 31.9 (L) 09/16/2016 1701   PLT 242 04/10/2018 0550   PLT 261 09/16/2016 1701   MCV 72.6 (L) 04/10/2018 0550   MCV 74.5 (A) 12/14/2016 0901   MCV 78 (L) 09/16/2016 1701   MCH 21.3 (L) 04/10/2018 0550   MCHC 29.3 (L) 04/10/2018 0550   RDW 22.5 (H) 04/10/2018 0550   RDW 23.6 (H) 09/16/2016 1701   LYMPHSABS 1.3 04/10/2018 0550   LYMPHSABS  1.1 09/16/2016 1701   MONOABS 0.5 04/10/2018 0550   EOSABS 0.1 04/10/2018 0550   EOSABS 0.1 09/16/2016 1701   BASOSABS 0.1 04/10/2018 0550   BASOSABS 0.0 09/16/2016 1701    Studies/Results: No results found.  Medications: I have reviewed the patient's current medications.  Assessment/Plan:   1.  Left MCA infarction with right-sided weakness and aphasia.  Continue with CIR including PT, OT, SLP 2.  DVT prophylaxis with SCDs. 3.  Pain management with Tylenol 4.  Mild hypokalemia-on p.o. supplements 5.  Hypertension.  Lopressor 400 mg twice a day prior to admission - monitor pressures 6.  History of epilepsy - no recent seizures 7.  Microcytic anemia, improving 8.  Epigastric tenderness, resolved    Length of stay, days: 8  Walker Kehr , MD 04/15/2018, 11:32 AM

## 2018-04-16 ENCOUNTER — Inpatient Hospital Stay (HOSPITAL_COMMUNITY): Payer: Self-pay

## 2018-04-16 NOTE — Progress Notes (Signed)
Non-compliant with cardiac diet. Orders out most meals. Talked with patient about cardiac diet. Tina Moss A

## 2018-04-16 NOTE — Progress Notes (Signed)
Tina Moss is a 47 y.o. female 24-Mar-1971 [id number]  Subjective: No new complaints. No new problems. Slept well. Feeling OK.  Objective: Vital signs in last 24 hours: Temp:  [97.5 F (36.4 C)-98.7 F (37.1 C)] 98.7 F (37.1 C) (11/03 0543) Pulse Rate:  [84-96] 96 (11/03 0543) Resp:  [16-20] 16 (11/03 0543) BP: (127-142)/(85-94) 127/85 (11/03 0543) SpO2:  [100 %] 100 % (11/03 0543) Weight change:  Last BM Date: 04/15/18  Intake/Output from previous day: 11/02 0701 - 11/03 0700 In: 700 [P.O.:700] Out: -  Last cbgs: CBG (last 3)  No results for input(s): GLUCAP in the last 72 hours.   Physical Exam General: No apparent distress   HEENT: not dry Lungs: Normal effort. Lungs clear to auscultation, no crackles or wheezes. Cardiovascular: Regular rate and rhythm, no edema Abdomen: S/NT/ND; BS(+) Musculoskeletal:  unchanged Neurological: No new neurological deficits Wounds: N/A    Skin: clear  Mental state: Alert, oriented, cooperative    Lab Results: BMET    Component Value Date/Time   NA 137 04/10/2018 0550   NA 141 10/13/2016 1559   K 3.4 (L) 04/10/2018 0550   CL 105 04/10/2018 0550   CO2 26 04/10/2018 0550   GLUCOSE 109 (H) 04/10/2018 0550   BUN 10 04/10/2018 0550   BUN 11 10/13/2016 1559   CREATININE 0.61 04/10/2018 0550   CALCIUM 9.5 04/10/2018 0550   GFRNONAA >60 04/10/2018 0550   GFRAA >60 04/10/2018 0550   CBC    Component Value Date/Time   WBC 3.4 (L) 04/10/2018 0550   RBC 4.75 04/10/2018 0550   HGB 10.1 (L) 04/10/2018 0550   HGB 9.6 (L) 09/16/2016 1701   HCT 34.5 (L) 04/10/2018 0550   HCT 31.9 (L) 09/16/2016 1701   PLT 242 04/10/2018 0550   PLT 261 09/16/2016 1701   MCV 72.6 (L) 04/10/2018 0550   MCV 74.5 (A) 12/14/2016 0901   MCV 78 (L) 09/16/2016 1701   MCH 21.3 (L) 04/10/2018 0550   MCHC 29.3 (L) 04/10/2018 0550   RDW 22.5 (H) 04/10/2018 0550   RDW 23.6 (H) 09/16/2016 1701   LYMPHSABS 1.3 04/10/2018 0550   LYMPHSABS 1.1 09/16/2016  1701   MONOABS 0.5 04/10/2018 0550   EOSABS 0.1 04/10/2018 0550   EOSABS 0.1 09/16/2016 1701   BASOSABS 0.1 04/10/2018 0550   BASOSABS 0.0 09/16/2016 1701    Studies/Results: No results found.  Medications: I have reviewed the patient's current medications.  Assessment/Plan:   1.  Left MCA infarction with right-sided weakness and aphasia.  Continue with CIR including PT, OT, SLP 2.  DVT prophylaxis with SCDs 3.  Pain management with Tylenol PRN 4.  Mild hypokalemia.  On p.o. supplements 5.  Hypertension.  On Lopressor 6.  History of epilepsy.  No recent seizures 7.  Microcytic anemia, improving 8.  Epigastric tenderness, resolved     Length of stay, days: 9  Walker Kehr , MD 04/16/2018, 12:15 PM

## 2018-04-16 NOTE — Progress Notes (Signed)
Occupational Therapy Session Note  Patient Details  Name: TIJAH HANE MRN: [id number] Date of Birth: Sep 06, 1970  Today's Date: 04/16/2018 OT Individual Time: 4327-6147 OT Individual Time Calculation (min): 43 min    Short Term Goals: Week 1:  OT Short Term Goal 1 (Week 1): Pt will maintain dynamic standing balance during LB self care with supervision assist  OT Short Term Goal 2 (Week 1): Pt will complete toilet transfer with supervision using LRAD OT Short Term Goal 3 (Week 1): Pt will complete 3/3 components of toileting with supervision   Skilled Therapeutic Interventions/Progress Updates:    1;1. Pt reporting pain in R knee (declines medication) managed with rest breaks and ice pack after tx. Pt describes as feeling like there is "sand in it." Pt requesting to work on BLE exercise. Pt completes 1x10 sit to stand with no UE and S, 2x5 sit to stand with no UE and step under 1LE per set to challenge balance/weight shifting with CGA. Pt ambulates to vending machine with RW and CGA-S looking for preferred candy bar however not satisfied with selection. OT cues pt to look ahead while ambulating. Pt pushing w/c back to room from vending machine d/t pain in knee. Exited session wih pt seated in w/c call light in reach and all needs met  Therapy Documentation Precautions:  Precautions Precautions: Fall Restrictions Weight Bearing Restrictions: No   Therapy/Group: Individual Therapy  Tonny Branch 04/16/2018, 12:16 PM

## 2018-04-17 ENCOUNTER — Inpatient Hospital Stay (HOSPITAL_COMMUNITY): Payer: Self-pay | Admitting: Physical Therapy

## 2018-04-17 ENCOUNTER — Inpatient Hospital Stay (HOSPITAL_COMMUNITY): Payer: Self-pay | Admitting: Occupational Therapy

## 2018-04-17 ENCOUNTER — Inpatient Hospital Stay (HOSPITAL_COMMUNITY): Payer: Self-pay

## 2018-04-17 DIAGNOSIS — R269 Unspecified abnormalities of gait and mobility: Secondary | ICD-10-CM

## 2018-04-17 DIAGNOSIS — I69398 Other sequelae of cerebral infarction: Secondary | ICD-10-CM

## 2018-04-17 NOTE — Progress Notes (Signed)
Occupational Therapy Session Note  Patient Details  Name: Tina Moss MRN: [id number] Date of Birth: 27-Mar-1971  Today's Date: 04/17/2018 OT Individual Time: 1302-1330 OT Individual Time Calculation (min): 28 min    Short Term Goals: Week 1:  OT Short Term Goal 1 (Week 1): Pt will maintain dynamic standing balance during LB self care with supervision assist  OT Short Term Goal 2 (Week 1): Pt will complete toilet transfer with supervision using LRAD OT Short Term Goal 3 (Week 1): Pt will complete 3/3 components of toileting with supervision   Skilled Therapeutic Interventions/Progress Updates:    Treatment session with focus on endurance and energy conservation.  Pt received upright in w/c requesting to go to hospital cafeteria to purchase additional food for meal.  Pt propelled w/c on/off elevators and around obstacles without assistance.  Pt able to obtain food items and communicate with cafeteria staff without issue.  Returned to room as above.  Engaged in education on energy conservation strategies with self-care tasks and home making activities, provided pt with energy conservation handout to which pt reports understanding.  Discussed recommendation for [facility] for further therapy needs with pt in agreement.  Therapy Documentation Precautions:  Precautions Precautions: Fall Restrictions Weight Bearing Restrictions: No General:   Vital Signs: Therapy Vitals Temp: 98 F (36.7 C) Pulse Rate: 93 Resp: 14 BP: 127/88 Patient Position (if appropriate): Sitting Oxygen Therapy SpO2: 100 % O2 Device: Room Air Pain: Pain Assessment Pain Scale: 0-10 Pain Score: 0-No pain   Therapy/Group: Individual Therapy  Simonne Come 04/17/2018, 1:47 PM

## 2018-04-17 NOTE — Discharge Summary (Signed)
NAME: Tina Moss, KEGG MEDICAL RECORD [id number] ACCOUNT [id number] DATE OF BIRTH:Oct 21, 1970 FACILITY: [facility] LOCATION: [id number] PHYSICIAN:ANDREW Letta Pate, MD  DISCHARGE SUMMARY  DATE OF DISCHARGE:  04/18/2018  DISCHARGE DIAGNOSES:   1.  Left middle cerebral artery infarction. 2.  Sequential compression devices for deep venous thrombosis prophylaxis. 3.  Hypertension. 4.  History of epilepsy. 5.  Microcytic anemia.  HISTORY OF PRESENT ILLNESS:  This is a 47 year old right-handed female with history of epilepsy, hypertension, seizure disorder, question medical compliance.  She has seen Dr. Tomi Likens in the past concerning for MS, but no LP was done.  Presented 04/04/2018  with right-sided weakness and aphasia.  Per chart review, lives with boyfriend.  Independent prior to admission.  Cranial CT scan showed small area of cytotoxic edema in the left MCA M4 segment without hemorrhage or mass effect.  Underlying chronic  bilateral cerebral white matter disease.  CT angiogram of head and neck negative for large vessel occlusion.  The patient did not receive tPA.  Urine drug screen negative.  MRI showed small acute posterior left frontal lobe infarction, MCA territory  negative head MRA.  Venous Doppler studies negative.  Echocardiogram with ejection fraction of 65%, no wall motion abnormalities.  TEE normal LV function, no PFO.  LP completed 04/06/2018 to assess for CNS pathology and was seen by neurology in the past  for questionable MS, but no workup was initiated.  Results were pending.  The patient remained on aspirin and Plavix for CVA prophylaxis.  Therapy evaluations completed, the patient was admitted for comprehensive rehabilitation program.  PAST MEDICAL HISTORY:  See discharge diagnoses.  SOCIAL HISTORY:  Independent prior to admission living with her boyfriend.  FUNCTIONAL STATUS:  Upon admission to rehab services was minimal assist, stand pivot transfers, moderate assist sit to stand,  minimal assist 25 feet rolling walker, min mod assist with activities of daily living.  PHYSICAL EXAMINATION: VITAL SIGNS:  Blood pressure 139/76, pulse 98, temperature 98, respirations 18. GENERAL:  Alert female.  She was aphasic.  Followed commands.  She could express basic thoughts and phrases. HEENT:  EOMs intact. NECK:  Supple, nontender, no JVD. CARDIOVASCULAR:  Rate controlled. ABDOMEN:  Soft, nontender, good bowel sounds. LUNGS:  Clear to auscultation.  REHABILITATION HOSPITAL COURSE:  The patient was admitted to inpatient rehabilitation services.  Therapies initiated on a 3-hour daily basis, consisting of physical therapy, occupational therapy, speech therapy and rehabilitation nursing.  The following  issues were addressed during patient's rehabilitation stay:    Pertaining to the left MCA infarction, remained stable.  She will continue on aspirin and Plavix therapy.  Follow up neurology services.    Also noted history workup for possible MS.  She would follow up with Dr. Tomi Likens.  Recent LP studies unremarkable.    Venous Dopplers for DVT prophylaxis is negative.  She remained with SCDs.    Blood pressure is controlled.  She had been on Lopressor in the past, questionable medical compliance.  She would follow up with her primary care provider before resuming his medication for permissive hypertension.    Noted history of epilepsy.  Plan outpatient followup in the past.  No seizure activity noted.    The patient received weekly collaborative interdisciplinary team conferences to discuss estimated length of stay, family teaching, any barriers to her discharge.  The patient ambulates to the gym with rolling walker, supervision, somewhat distracted at  times.  Large base quad cane, contact guard assist for 40 feet.  Can also use a  rolling walker 125 feet.  Perform side stepping in the hallway supervision.  Gathered belongings for activities of daily living and homemaking.  It was  advised no driving.   Full family teaching completed and planned discharge to home.  DISCHARGE MEDICATIONS:  Included aspirin 81 mg p.o. daily, Lipitor 20 mg daily, Plavix 75 mg daily, Pepcid 10 mg b.i.d.  DIET:  Regular.  The patient would follow up with Dr. Tomi Likens, call for appointment; Dr. Alysia Penna as directed; Dr. Erlinda Hong of [facility] call for appointment; Dr. Margarita Rana, medical management.  SPECIAL INSTRUCTIONS:  No driving.  No smoking, no alcohol.  Aspirin and Plavix x3 weeks, then aspirin alone.Follow-up with primary care physician for monitoring of blood pressure resuming antihypertensive medications as needed  AN/NUANCE D:04/17/2018 T:04/17/2018 JOB:[id number]

## 2018-04-17 NOTE — Progress Notes (Signed)
Occupational Therapy Session Note  Patient Details  Name: Tina Moss MRN: [id number] Date of Birth: 1970-07-04  Today's Date: 04/17/2018 OT Individual Time: 8110-3159 OT Individual Time Calculation (min): 45 min    Short Term Goals: Week 1:  OT Short Term Goal 1 (Week 1): Pt will maintain dynamic standing balance during LB self care with supervision assist  OT Short Term Goal 2 (Week 1): Pt will complete toilet transfer with supervision using LRAD OT Short Term Goal 3 (Week 1): Pt will complete 3/3 components of toileting with supervision   Skilled Therapeutic Interventions/Progress Updates:    Treatment session with focus on functional mobility in home environment and activity tolerance.  Pt received upright in w/c reporting already bathed and dressed this AM without assistance.  Pt ambulated to ADL apt with RW without assistance.  Engaged in functional transfers with RW, pt completing tub/shower transfer with tub bench with setup assist and toilet transfers at Mod I level with RW.  Engaged in mobility in ADL kitchen with focus on energy conservation strategies and use of counter tops and walker bag to transport items.  Pt reports small kitchen and can reach counter tops easily due to close proximity therefore will not need to carry items to move them from place to place.  Completed 9 hole peg test Rt: 27 seconds and Lt: 29 seconds.  Engaged in [place] with resistive clothespins, pt demonstrating increased pinch strength this session with ability to use up to level 4 resistance (only able to do 3 during previous sessions).  Pt returned to room ambulating with RW and completed toilet transfer and toileting at Mod I level with RW.  Therapy Documentation Precautions:  Precautions Precautions: Fall Restrictions Weight Bearing Restrictions: No General:   Vital Signs:   Pain: Pain Assessment Pain Scale: 0-10 Pain Score: 0-No pain ADL: ADL Eating: Not assessed Grooming: Not  assessed Upper Body Bathing: Supervision/safety Where Assessed-Upper Body Bathing: Shower Lower Body Bathing: Minimal assistance Where Assessed-Lower Body Bathing: Shower Upper Body Dressing: Supervision/safety Where Assessed-Upper Body Dressing: Edge of bed Lower Body Dressing: Minimal assistance Where Assessed-Lower Body Dressing: Edge of bed Toileting: Not assessed Toilet Transfer: Not assessed Social research officer, government: Minimal assistance Social research officer, government Method: Heritage manager: Civil engineer, contracting with back, Grab bars Vision Baseline Vision/History: Wears glasses Wears Glasses: Reading only Patient Visual Report: No change from baseline Perception    Praxis   Exercises:   Other Treatments:     Therapy/Group: Individual Therapy  Simonne Come 04/17/2018, 11:59 AM

## 2018-04-17 NOTE — Progress Notes (Signed)
Occupational Therapy Discharge Summary  Patient Details  Name: Tina Moss MRN: [id number] Date of Birth: 1970/11/25  Patient has met 9 of 9 long term goals due to improved activity tolerance, improved balance, postural control, ability to compensate for deficits, improved attention, improved awareness and improved coordination.  Patient to discharge at overall Modified Independent level.  Patient's care partner is independent to provide the necessary intermittent assistance at discharge.  Therapist is recommending setup for bathing at shower level, pt has reported understanding that the recommendation is to have someone in the apartment when she is to bathe at to make sure that the tub seat is setup.    Reasons goals not met: N/A  Recommendation:  Patient will benefit from ongoing skilled OT services in home health setting to continue to advance functional skills in the area of BADL and Reduce care partner burden.  Equipment: tub transfer bench  Reasons for discharge: treatment goals met and discharge from hospital  Patient/family agrees with progress made and goals achieved: Yes  OT Discharge Precautions/Restrictions  Precautions Precautions: Fall Restrictions Weight Bearing Restrictions: No Pain Pain Assessment Pain Scale: 0-10 Pain Score: 0-No pain ADL ADL Eating: Independent Where Assessed-Eating: Chair Grooming: Independent Where Assessed-Grooming: Sitting at sink Upper Body Bathing: Setup Where Assessed-Upper Body Bathing: Shower Lower Body Bathing: Setup Where Assessed-Lower Body Bathing: Shower Upper Body Dressing: Modified independent (Device) Where Assessed-Upper Body Dressing: Wheelchair Lower Body Dressing: Modified independent Where Assessed-Lower Body Dressing: Wheelchair Toileting: Modified independent Where Assessed-Toileting: Risk analyst Method: Counselling psychologist: Other (comment),  Grab bars(RW) Tub/Shower Transfer: Distant supervision Tub/Shower Transfer Method: Sit pivot Tub/Shower Equipment: Facilities manager: Modified independent Social research officer, government Method: Heritage manager: Civil engineer, contracting with back, Grab bars Vision Baseline Vision/History: Wears glasses Wears Glasses: Reading only Patient Visual Report: No change from baseline Vision Assessment?: No apparent visual deficits Cognition Overall Cognitive Status: Within Functional Limits for tasks assessed Arousal/Alertness: Awake/alert Orientation Level: Oriented X4 Attention: Sustained;Selective Sustained Attention: Appears intact Selective Attention: Appears intact Memory: Appears intact Awareness: Appears intact Problem Solving: Appears intact Safety/Judgment: Appears intact Sensation Sensation Light Touch: Impaired by gross assessment(pt reports numbness in tips of fingers on R hand) Proprioception: Appears Intact Coordination Gross Motor Movements are Fluid and Coordinated: Yes Fine Motor Movements are Fluid and Coordinated: Yes Finger Nose Finger Test: Mild Dysmetria R UE, much improved from eval 9 Hole Peg Test: Rt: 27 seconds, Lt: 29 seconds Motor  Motor Motor: Hemiplegia Motor - Skilled Clinical Observations: Mild R hemi  Motor - Discharge Observations: mild R hemi, generalized deconditioning Mobility  Bed Mobility Bed Mobility: Rolling Right;Rolling Left;Sit to Supine;Supine to Sit(regular bed in apartment) Rolling Right: Independent Rolling Left: Independent Supine to Sit: Independent Sit to Supine: Independent Transfers Sit to Stand: Independent with assistive device(RW) Stand to Sit: Independent with assistive device(RW)   Balance Balance Balance Assessed: Yes Standardized Balance Assessment Standardized Balance Assessment: Berg Balance Test Berg Balance Test Sit to Stand: Able to stand without using hands and stabilize  independently Standing Unsupported: Able to stand safely 2 minutes Sitting with Back Unsupported but Feet Supported on Floor or Stool: Able to sit safely and securely 2 minutes Stand to Sit: Sits safely with minimal use of hands Transfers: Able to transfer safely, definite need of hands Standing Unsupported with Eyes Closed: Able to stand 10 seconds safely Standing Ubsupported with Feet Together: Able to place feet together independently and stand 1 minute safely  From Standing, Reach Forward with Outstretched Arm: Can reach confidently >25 cm (10") From Standing Position, Pick up Object from Floor: Able to pick up shoe safely and easily From Standing Position, Turn to Look Behind Over each Shoulder: Looks behind from both sides and weight shifts well Turn 360 Degrees: Able to turn 360 degrees safely but slowly Standing Unsupported, Alternately Place Feet on Step/Stool: Able to stand independently and complete 8 steps >20 seconds Standing Unsupported, One Foot in Front: Able to place foot tandem independently and hold 30 seconds Standing on One Leg: Able to lift leg independently and hold 5-10 seconds(RLE elevated) Total Score: 51 Extremity/Trunk Assessment RUE Assessment RUE Assessment: Within Functional Limits Active Range of Motion (AROM) Comments: WFL RUE Body System: Neuro RUE Strength RUE Overall Strength Comments: strength grossly 4/5 overall Right Hand Gross Grasp: Functional LUE Assessment LUE Assessment: Within Functional Limits General Strength Comments: strength grossly 4/5 overall   Contrell Ballentine 04/17/2018, 12:30 PM

## 2018-04-17 NOTE — Progress Notes (Addendum)
Physical Therapy Discharge Summary  Patient Details  Name: Tina Moss MRN: [id number] Date of Birth: 02-12-1971  Today's Date: 04/17/2018 PT Individual Time: 0802-0919 PT Individual Time Calculation (min): 77 min    Patient has met 10 of 10 long term goals due to improved activity tolerance, improved balance, improved postural control, increased strength, ability to compensate for deficits, functional use of  right upper extremity and right lower extremity, improved attention, improved awareness and improved coordination.  Patient to discharge at an ambulatory level supervision/mod I with RW.   No family members were present for training during therapy sessions with this therapist.   Reasons goals not met: n/a  Recommendation:  Patient will benefit from ongoing skilled PT services in outpatient setting to continue to advance safe functional mobility, address ongoing impairments in balance, endurance, R NMR, strengthening, and minimize fall risk.  Equipment: RW  Reasons for discharge: treatment goals met and discharge from hospital  Patient/family agrees with progress made and goals achieved: Yes  Skilled PT Treatment: Pt received in w/c & agreeable to tx. Educated pt on need to f/u with MD regarding inability to drive (pt thoroughly educated that she is not allowed to drive until cleared by MD), f/u recommendations of OPPT & pt reporting she has someone that can transport her to appointments, and need to take medications (reveiwed current meds with pt) as prescribed by MD to prevent additional strokes. Pt completes sit<>stand transfers with mod I, ambulates over even surface & ramp with RW & mod I, uneven surface with RW & supervision, negotiates curb with RW & supervision, and completes car transfer with set up assist. Pt completes bed mobility in apartment independently and negotiates 20 steps laterally with R rail with supervision assist with occasional cuing for pattern. Pt completed  Berg Balance Test & scored 51/56; educated pt on interpretation & improvement in score & recommendation of continued use of RW for mobility. Patient demonstrates increased fall risk as noted by score of 51/56 on Berg Balance Scale.  (<36= high risk for falls, close to 100%; 37-45 significant >80%; 46-51 moderate >50%; 52-55 lower >25%). Pt completed floor transfer with supervision with cuing to transfer from floor>sitting on stable furniture vs standing up in the middle of the room; also reviewed instances when she should call EMS after experiencing a fall at home. Pt stood on rocker board without BUE support with challenges A/P and laterally with eyes open & closed, and during A/P challenges therapist added external perturbations. Pt with most difficulty maintaining balance and midline with eyes closed and lateral challenges to balance. Pt utilized cybex kinetron in standing without BUE support with task focusing on weight shifting, R NMR, & dynamic balance with pt requiring close supervision<>CGA for balance with therapist providing intermittent min manual facilitation for weight shifting. At end of session pt left sitting in w/c in room with call bell & all needs in reach.    PT Discharge Precautions/Restrictions Precautions Precautions: Fall Restrictions Weight Bearing Restrictions: No  Pain Pt reports R hip ache but does not rate, stating "It'll go away."  Vision/Perception  Pt reports she wears glasses for reading only at baseline. Pt denies changes in baseline vision.  Cognition Overall Cognitive Status: Within Functional Limits for tasks assessed Arousal/Alertness: Awake/alert Selective Attention: Appears intact Awareness: Appears intact Problem Solving: Appears intact Safety/Judgment: Appears intact  Sensation Sensation Light Touch: Impaired by gross assessment(pt reports numbness in tips of fingers on R hand) Proprioception: Appears Intact Coordination Gross Motor Movements  are  Fluid and Coordinated: Yes Fine Motor Movements are Fluid and Coordinated: Yes  Motor  Motor Motor: Hemiplegia Motor - Skilled Clinical Observations: Mild R hemi  Motor - Discharge Observations: mild R hemi, generalized deconditioning   Mobility Bed Mobility Bed Mobility: Rolling Right;Rolling Left;Sit to Supine;Supine to Sit(regular bed in apartment) Rolling Right: Independent Rolling Left: Independent Supine to Sit: Independent Sit to Supine: Independent Transfers Transfers: Sit to Stand;Stand to Sit Sit to Stand: Independent with assistive device(RW) Stand to Sit: Independent with assistive device(RW)  Locomotion  Gait Ambulation: Yes Gait Assistance: Independent with assistive device Gait Distance (Feet): 150 Feet Assistive device: Rolling walker Gait Assistance Details: (verbal cuing to decreased BUE reliance on RW) Gait Gait: Yes Gait Pattern: Impaired Gait Pattern: (decreased weight shifting, decreased stride length, decreased gait speed) Stairs / Additional Locomotion Stairs: Yes Stairs Assistance: Supervision/Verbal cueing Stair Management Technique: One rail Right;Sideways Number of Stairs: 20 Height of Stairs: 6(inches) Ramp: Independent with assistive device(ambulatory with RW) Curb: Supervision/Verbal cueing(with RW) Wheelchair Mobility Wheelchair Mobility: No  Balance Balance Balance Assessed: Yes Standardized Balance Assessment Standardized Balance Assessment: Berg Balance Test Berg Balance Test Sit to Stand: Able to stand without using hands and stabilize independently Standing Unsupported: Able to stand safely 2 minutes Sitting with Back Unsupported but Feet Supported on Floor or Stool: Able to sit safely and securely 2 minutes Stand to Sit: Sits safely with minimal use of hands Transfers: Able to transfer safely, definite need of hands Standing Unsupported with Eyes Closed: Able to stand 10 seconds safely Standing Ubsupported with Feet  Together: Able to place feet together independently and stand 1 minute safely From Standing, Reach Forward with Outstretched Arm: Can reach confidently >25 cm (10") From Standing Position, Pick up Object from Floor: Able to pick up shoe safely and easily From Standing Position, Turn to Look Behind Over each Shoulder: Looks behind from both sides and weight shifts well Turn 360 Degrees: Able to turn 360 degrees safely but slowly Standing Unsupported, Alternately Place Feet on Step/Stool: Able to stand independently and complete 8 steps >20 seconds Standing Unsupported, One Foot in Front: Able to place foot tandem independently and hold 30 seconds Standing on One Leg: Able to lift leg independently and hold 5-10 seconds(RLE elevated) Total Score: 51   Extremity Assessment  RUE Assessment RUE Assessment: Within Functional Limits LUE Assessment LUE Assessment: Within Functional Limits RLE Assessment RLE Assessment: Within Functional Limits LLE Assessment LLE Assessment: Within Functional Limits    Waunita Schooner 04/17/2018, 9:22 AM

## 2018-04-17 NOTE — Discharge Summary (Signed)
NAME: Tina Moss, Tina Moss MEDICAL RECORD [id number] ACCOUNT [id number] DATE OF BIRTH:Dec 06, 1970 FACILITY: [facility] LOCATION: [facility] PHYSICIAN:ANDREW Letta Pate, MD  DISCHARGE SUMMARY  DATE OF DISCHARGE:  04/17/2018  NO DICTATION  AN/NUANCE D:04/17/2018 T:04/17/2018 JOB:[id number]

## 2018-04-17 NOTE — Discharge Summary (Signed)
Discharge summary job [id number]

## 2018-04-17 NOTE — Progress Notes (Signed)
Speech Language Pathology Discharge Summary  Patient Details  Name: Tina Moss MRN: [id number] Date of Birth: [birth year]  Today's Date: 04/17/2018 SLP Individual Time: 1000-1100 SLP Individual Time Calculation (min): 60 min   Skilled Therapeutic Interventions:   Skilled ST services focused on speech skills and education. SLP facilitated complex abstract thought in conversation, pertaining to discharge plan and future plans, while navigating through the hospital (paneara, boards head and cafeteria) pt demonstrated Mod I. SLP provided education in continued use of word finding, error correction strategies in all contexts, pt verbal recalled strategies. All questions answered to satisfaction. Pt was left in room with call bell within reach and chair alarm set.     Patient has met 2 of 2 long term goals.  Patient to discharge at overall Modified Independent level.  Reasons goals not met:     Clinical Impression/Discharge Summary:   Pt met 2 out 2 goals, discharging at Mod I. Pt demonstrates ability to utilizing word finding skills in conversation, awareness/correct verbal errors and express complex abstract thought in various environments. Pt benefited from skilled ST services in order to maximize functional independence in communication and reduce burden of care. No further ST services are recommended due to Mod I level.  Care Partner:  Caregiver Able to Provide Assistance: Yes  Type of Caregiver Assistance: Physical  Recommendation:  None      Equipment:   N/A  Reasons for discharge: Discharged from hospital;Treatment goals met   Patient/Family Agrees with Progress Made and Goals Achieved: Yes    Skylen Danielsen 04/17/2018, 11:22 AM

## 2018-04-17 NOTE — Progress Notes (Signed)
[place] PHYSICAL MEDICINE & REHABILITATION PROGRESS NOTE   Subjective/Complaints:  Discussed no driving and no RTW yet   ROS: Patient denies bowel or bladder problems, breathing issues   Objective:   No results found. No results for input(s): WBC, HGB, HCT, PLT in the last 72 hours. No results for input(s): NA, K, CL, CO2, GLUCOSE, BUN, CREATININE, CALCIUM in the last 72 hours.  Intake/Output Summary (Last 24 hours) at 04/17/2018 0826 Last data filed at 04/17/2018 0700 Gross per 24 hour  Intake 1060 ml  Output -  Net 1060 ml     Physical Exam: Vital Signs Blood pressure 138/81, pulse 85, temperature 98 F (36.7 C), temperature source Oral, resp. rate 19, height 5\' 7"  (1.702 m), weight 85.6 kg, last menstrual period 03/21/2018, SpO2 98 %. Constitutional: No distress . Vital signs reviewed. HEENT: EOMI, oral membranes moist Neck: supple Cardiovascular: RRR without murmur. No JVD    Respiratory: CTA Bilaterally without wheezes or rales. Normal effort    GI: BS +,epigastric tenderness, non-distended  Musculoskeletal:Normal range of motion.No tenderness Neurological: She isalertand oriented to person, place, and time. Patient is expressively aphasic/apraxic. Marland Kitchen Speech non-fluent but can communicate ideas. RUE and RLE HP   4-/5 with apraxia. LUE and LLE 4+ to 5/5---stable  Skin: Skin iswarmand dry.  Psychiatric: She has anormal mood and affect. Herbehavior is normal    Assessment/Plan:  1. 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 Stable for D/C today F/u PCP in 3-4 weeks F/u PM&R 2 weeks See D/C summary  See D/C instructions  Care Tool:  Bathing    Body parts bathed by patient: Right arm, Left arm, Chest, Abdomen, Front perineal area, Buttocks, Right upper leg, Left upper leg, Left lower leg, Face, Right lower leg   Body parts bathed by helper: Right lower leg     Bathing assist Assist Level: Set up assist     Upper Body Dressing/Undressing Upper  body dressing   What is the patient wearing?: Pull over shirt, Bra    Upper body assist Assist Level: Set up assist    Lower Body Dressing/Undressing Lower body dressing      What is the patient wearing?: Pants, Underwear/pull up     Lower body assist Assist for lower body dressing: Supervision/Verbal cueing     Toileting Toileting Toileting Activity did not occur (Clothing management and hygiene only): N/A (no void or bm)  Toileting assist Assist for toileting: Supervision/Verbal cueing     Transfers Chair/bed transfer  Transfers assist     Chair/bed transfer assist level: Supervision/Verbal cueing     Locomotion Ambulation   Ambulation assist      Assist level: Supervision/Verbal cueing Assistive device: Walker-rolling Max distance: 100 ft   Walk 10 feet activity   Assist     Assist level: Supervision/Verbal cueing Assistive device: Walker-rolling   Walk 50 feet activity   Assist    Assist level: Supervision/Verbal cueing Assistive device: Walker-rolling    Walk 150 feet activity   Assist Walk 150 feet activity did not occur: Safety/medical concerns  Assist level: Supervision/Verbal cueing Assistive device: Walker-rolling    Walk 10 feet on uneven surface  activity   Assist Walk 10 feet on uneven surfaces activity did not occur: Safety/medical concerns         Wheelchair     Assist Will patient use wheelchair at discharge?: No(anticipate pt will be primary ambulator at d/c)   Wheelchair activity did not occur: N/A  Wheelchair assist level: Supervision/Verbal  cueing Max wheelchair distance: 150 ft     Wheelchair 50 feet with 2 turns activity    Assist    Wheelchair 50 feet with 2 turns activity did not occur: N/A   Assist Level: Supervision/Verbal cueing   Wheelchair 150 feet activity     Assist Wheelchair 150 feet activity did not occur: N/A   Assist Level: Supervision/Verbal cueing      Medical  Problem List and Plan: 1.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 DVT Prophylaxis/Anticoagulation: SCDs. Venous Doppler studies negative 3. Pain Management:Tylenol as needed 4. Mood:Provide emotional support and positive reinforcement 5. Neuropsych: This patientiscapable of making decisions on herown behalf. 6. Skin/Wound Care:Routine skin checks 7. Fluids/Electrolytes/Nutrition:Routine in and outs with follow-up chemistries Mild HypoKalemia- supplement po 8.Hypertension. Permissive hypertension. Patient on Lopressor 100 mg twice daily prior to admission question medical compliance.     Vitals:   04/16/18 1949 04/17/18 0619  BP: 127/75 138/81  Pulse: (!) 112 85  Resp:  19  Temp: 97.9 F (36.6 C) 98 F (36.7 C)  SpO2: 99% 98%  Controlled 11/5 9.History of epilepsy/seizure disorder as well as work-up for possible MS. No current seizure medication. Plan follow-up outpatient neurology services as patient has been seen by Dr. Tomi Likens in the past. LP results 04/06/2018 unremarkable- work up for MS negative thus far, white matter lesions c/w SVD  No seizure since rehab admit 10.Hyperlipidemia. Lipitor 11.  Microcytic anemia improving 12.  Epigastric tenderness as well as musculoskeletal CP, cont H2 blocker and sportscreme  10 days A FACE TO FACE EVALUATION WAS PERFORMED  Charlett Blake 04/17/2018, 8:26 AM

## 2018-04-18 ENCOUNTER — Encounter (HOSPITAL_COMMUNITY): Payer: Self-pay

## 2018-04-18 MED ORDER — ACETAMINOPHEN 325 MG PO TABS: 650.0000 mg | ORAL_TABLET | ORAL | Status: DC | PRN

## 2018-04-18 MED ORDER — ATORVASTATIN CALCIUM 20 MG PO TABS: 20.0000 mg | ORAL_TABLET | Freq: Every day | ORAL | 0 refills | Status: DC

## 2018-04-18 MED ORDER — CLOPIDOGREL BISULFATE 75 MG PO TABS: 75.0000 mg | ORAL_TABLET | Freq: Every day | ORAL | 0 refills | Status: DC

## 2018-04-18 MED ORDER — FAMOTIDINE 10 MG PO TABS: 10.0000 mg | ORAL_TABLET | Freq: Two times a day (BID) | ORAL | 0 refills | Status: DC

## 2018-04-18 NOTE — Progress Notes (Signed)
Patient received the discharged instructions from [facility] before she left the hospital.

## 2018-04-18 NOTE — Progress Notes (Signed)
Social Work  Discharge Note  The overall goal for the admission was met for:   Discharge location: Yes-HOME WITH BOYFRIEND WHO CAN INTERMITTENT ASSIST  Length of Stay: Yes-11 DAYS  Discharge activity level: Yes-MOD/I LEVEL  Home/community participation: Yes  Services provided included: MD, RD, PT, OT, SLP, RN, CM, TR, Pharmacy, Neuropsych and SW  Financial Services: Other: PENDING MEDICAID  Follow-up services arranged: Home Health: [facility]-PT,OT,SP,SW, DME: [facility]-ROLLING WALKER, TUB BENCH & 3 IN 1 and Patient/Family has no preference for HH/DME agencies  Comments (or additional information):PT DID WELL AND REACHED HER GOALS SOON. PCP [place] [facility] 11/13 @ 1:30 PM. MATCH GIVEN TO PT FOR ASSIST WITH MEDICATIONS.  Patient/Family verbalized understanding of follow-up arrangements: Yes  Individual responsible for coordination of the follow-up plan: SELF & DWAYNE-BOYFRIEND  Confirmed correct DME delivered: Elease Hashimoto 04/18/2018    Elease Hashimoto

## 2018-04-19 ENCOUNTER — Telehealth: Payer: Self-pay

## 2018-04-19 NOTE — Telephone Encounter (Signed)
Called pt no answer, could not leave message because mailbox full  Transitional Care call-who you talked with    1. Are you/is patient experiencing any problems since coming home? Are there any questions regarding any aspect of care? 2. Are there any questions regarding medications administration/dosing? Are meds being taken as prescribed? Patient should review meds with caller to confirm 3. Have there been any falls? 4. Has Home Health been to the house and/or have they contacted you? If not, have you tried to contact them? Can we help you contact them? 5. Are bowels and bladder emptying properly? Are there any unexpected incontinence issues? If applicable, is patient following bowel/bladder programs? 6. Any fevers, problems with breathing, unexpected pain? 7. Are there any skin problems or new areas of breakdown? 8. Has the patient/family member arranged specialty MD follow up (ie cardiology/neurology/renal/surgical/etc)?  Can we help arrange? 9. Does the patient need any other services or support that we can help arrange? 10. Are caregivers following through as expected in assisting the patient? 11. Has the patient quit smoking, drinking alcohol, or using drugs as recommended?  Appointment time, arrive time 10:20 am for 10:40 am appt with [place] [address]

## 2018-04-20 ENCOUNTER — Telehealth: Payer: Self-pay | Admitting: Registered Nurse

## 2018-04-20 NOTE — Telephone Encounter (Signed)
Transitional Care call Transitional Care Call Completed  Patient name: Tina Moss DOB: 1970-12-16 1. Are you/is patient experiencing any problems since coming home? No a. Are there any questions regarding any aspect of care? No 2. Are there any questions regarding medications administration/dosing? Yes, Ms. Brener reports she wasn't able to pick up the medications. She was given the Match Program, [facility] was called and they report the  Match it's going through. This provider placed a call to [facility] LCSW, no answer. I spoke with Mikal Plane, she was given the information and she will call this provider back she reports. Placed another call to Ms. Ciresi, she is aware of the above.   a. Are meds being taken as prescribed? No, see above.  b. "Patient should review meds with caller to confirm" N/A 3. Have there been any falls? No 4. Has Home Health been to the house and/or have they contacted you? No a. If not, have you tried to contact them? No b. Can we help you contact them? Yes, this provider will call [place] [facility] and call Ms. Durnell with a update.  5. Are bowels and bladder emptying properly? Yes. a. Are there any unexpected incontinence issues? No b. If applicable, is patient following bowel/bladder programs? NA 6. Any fevers, problems with breathing, unexpected pain? No 7. Are there any skin problems or new areas of breakdown? No 8. Has the patient/family member arranged specialty MD follow up (ie cardiology/neurology/renal/surgical/etc.)?  All F/U appointments are scheduled.  a. Can we help arrange? NA 9. Does the patient need any other services or support that we can help arrange? No 10. Are caregivers following through as expected in assisting the patient? Yes. 11. Has the patient quit smoking, drinking alcohol, or using drugs as recommended? Ms. Muhlestein denies smoking, drinking alcohol or using illicit drugs.   Appointment date/time 04/28/2018  arrival time 10:20 for  10:40 appointment, with Danella Sensing ANP. At [place]

## 2018-04-20 NOTE — Telephone Encounter (Signed)
Received an e-mail from Ms. Dupree, the Match program was ficxed at [place] a call to Ms. Drummer and she is aware of the above.

## 2018-04-20 NOTE — Telephone Encounter (Signed)
Placed a call to [place] regarding outpatient therapies, representative sent message to referral coordinator, awaiting a return call.

## 2018-04-20 NOTE — Progress Notes (Signed)
NEUROLOGY FOLLOW UP OFFICE NOTE  Tina Moss [id number]  HISTORY OF PRESENT ILLNESS: Tina Moss is a 47 year old right-handed African-American woman with hypertension, tobacco use disorder, migraines, anxiety whom I saw last year for evaluation of multiple sclerosis and seizure disorder follows up for recent stroke.  UPDATE: Patient was not seen since September 2018.  EEG from 03/02/17 was normal.  She had MRI of brain, cervical and thoracic spine with and without contrast on 03/10/2017 which was personally reviewed and revealed multiple nonspecific T2 hyperintense foci in the cerebral white matter.  Spinal cord revealed no abnormal signal.  I had recommended lumbar puncture but the patient never had this done and was lost to follow-up.    She was admitted to [facility] on 04/04/2018 for acute stroke.  She presented with expressive aphasia and left sided facial and extremity numbness.  MRI of the brain was personally reviewed and revealed an acute left frontal MCA cortical infarct.  CT of the head and neck revealed no significant intracranial or extracranial arterial stenosis or occlusion.  TTE and TEE revealed normal ejection fraction with no cardiac source of emboli.  Telemetry revealed no arrhythmia.  LDL was 121.  Hemoglobin A1c was 5.9.  Urine drug screen, HIV and RPR were negative.  ANA, sed rate, CRP, and hypercoagulable panel were unremarkable.  She underwent a lumbar puncture for further evaluation of multiple sclerosis.  CSF analysis revealed cell count of 1, protein of 34, glucose of 68, no oligoclonal bands, and negative VDRL and cryptococcal antigen.  She was discharged on aspirin 81 mg and Plavix 75 mg daily as well as atorvastatin 20 mg daily.  PAST MEDICAL HISTORY: Past Medical History:  Diagnosis Date  . Anemia   . Epilepsy ([place])   . Fibroids   . H/O bacterial infection   . H/O mumps   . Hyperlipidemia   . Hypertension   . Seizures ([place])      MEDICATIONS: Current Outpatient Medications on File Prior to Visit  Medication Sig Dispense Refill  . acetaminophen (TYLENOL) 325 MG tablet Take 2 tablets (650 mg total) by mouth every 4 (four) hours as needed for mild pain (or temp > 37.5 C (99.5 F)).    Marland Kitchen aspirin EC 81 MG EC tablet Take 1 tablet (81 mg total) by mouth daily. 30 tablet 0  . atorvastatin (LIPITOR) 20 MG tablet Take 1 tablet (20 mg total) by mouth daily at 6 PM. 30 tablet 0  . clopidogrel (PLAVIX) 75 MG tablet Take 1 tablet (75 mg total) by mouth daily. 30 tablet 0  . famotidine (PEPCID) 10 MG tablet Take 1 tablet (10 mg total) by mouth 2 (two) times daily. 60 tablet 0  . ferrous sulfate 325 (65 FE) MG tablet Take 1 tablet (325 mg total) by mouth daily with breakfast. 60 tablet 3   No current facility-administered medications on file prior to visit.     ALLERGIES: No Known Allergies  FAMILY HISTORY: Family History  Problem Relation Age of Onset  . Hypertension Mother   . Hypertension Sister   . Arthritis Sister        knees  . Hypertension Brother   . Deafness Brother   . Speech disorder Brother        mute  . Mental illness Brother        "he can just fly off"  . Mental illness Daughter        ? bipolar  . Scoliosis  Son    SOCIAL HISTORY: Social History   Socioeconomic History  . Marital status: Divorced    Spouse name: n/a  . Number of children: 2  . Years of education: 11th grade  . Highest education level: Not on file  Occupational History  . Occupation: Housekeeper    Comment: [employer]  Social Needs  . Financial resource strain: Not on file  . Food insecurity:    Worry: Not on file    Inability: Not on file  . Transportation needs:    Medical: Not on file    Non-medical: Not on file  Tobacco Use  . Smoking status: Current Every Day Smoker    Types: Cigars  . Smokeless tobacco: Current User  . Tobacco comment: "I think I just keep so much on my mind."  Substance and Sexual Activity  .  Alcohol use: Yes    Alcohol/week: 0.0 - 3.0 standard drinks    Comment: social  . Drug use: Not Currently    Types: Marijuana    Comment: seldom  . Sexual activity: Not Currently    Partners: Male  Lifestyle  . Physical activity:    Days per week: Not on file    Minutes per session: Not on file  . Stress: Not on file  Relationships  . Social connections:    Talks on phone: Not on file    Gets together: Not on file    Attends religious service: Not on file    Active member of club or organization: Not on file    Attends meetings of clubs or organizations: Not on file    Relationship status: Not on file  . Intimate partner violence:    Fear of current or ex partner: Not on file    Emotionally abused: Not on file    Physically abused: Not on file    Forced sexual activity: Not on file  Other Topics Concern  . Not on file  Social History Narrative   Lives with her daughter.   Son is currently incarcerated in [place].   She completed 11th grade, but was kicked out and never when back, as she had become pregnant and was raising her daughter.   Divorced x 2.    REVIEW OF SYSTEMS: Constitutional: No fevers, chills, or sweats, no generalized fatigue, change in appetite Eyes: No visual changes, double vision, eye pain Ear, nose and throat: No hearing loss, ear pain, nasal congestion, sore throat Cardiovascular: No chest pain, palpitations Respiratory:  No shortness of breath at rest or with exertion, wheezes GastrointestinaI: No nausea, vomiting, diarrhea, abdominal pain, fecal incontinence Genitourinary:  No dysuria, urinary retention or frequency Musculoskeletal:  No neck pain, back pain Integumentary: No rash, pruritus, skin lesions Neurological: as above Psychiatric: No depression, insomnia, anxiety Endocrine: No palpitations, fatigue, diaphoresis, mood swings, change in appetite, change in weight, increased thirst Hematologic/Lymphatic:  No purpura,  petechiae. Allergic/Immunologic: no itchy/runny eyes, nasal congestion, recent allergic reactions, rashes  PHYSICAL EXAM: Blood pressure 110/76, pulse 99, height 5' 6.5" (1.689 m), weight 184 lb (83.5 kg), SpO2 97 %. General: No acute distress.  Patient appears well-groomed.  Head:  Normocephalic/atraumatic Eyes:  Fundi examined but not visualized Neck: supple, no paraspinal tenderness, full range of motion Heart:  Regular rate and rhythm Lungs:  Clear to auscultation bilaterally Back: No paraspinal tenderness Neurological Exam: alert and oriented to person, place, and time. Attention span and concentration intact, recent and remote memory intact, fund of knowledge intact.  Speech fluent  and not dysarthric, language intact.  Mild right sided lower facial weakness.  Otherwise, CN II-XII intact. Bulk and tone normal, muscle strength 5/5 throughout.  Sensation to light touch, temperature and vibration intact.  Deep tendon reflexes 2+ throughout, toes downgoing.  Finger to nose and heel to shin testing intact.  Mild limp, Romberg negative.  IMPRESSION: 1.  Acute left MCA cortical infarct, embolic of unknown source 2.  Hypertension 3.  Hyperlipidemia 4.  Tobacco use disorder 5.  Seizure disorder.  Currently not on antiepileptic therapy.  Last seizure was approximately 4 to 5 years ago. 6.  Reported history of multiple sclerosis.  Work-up demonstrates that she does not have multiple sclerosis.  PLAN: 1.  Continue aspirin and Plavix until 11/15, at which point she may discontinue Plavix and continue ASA 81mg  daily alone. 2.  Continue atorvastatin 20mg  daily (LDL goal less than 70).  Repeat lipid panel in 4 months, just prior to follow up. 3.  Continue blood pressure control 4.  Tobacco cessation counseling (CPT 99406):  Tobacco use with history of stroke  - Currently smoking 4 cigarettes some days  - Patient was informed of the dangers of tobacco abuse including stroke, cancer, and MI, as  well as benefits of tobacco cessation. - Patient is willing to quit at this time. - Approximately 5 mins were spent counseling patient cessation techniques. We discussed various methods to help quit smoking, including deciding on a date to quit, joining a support group, pharmacological agents- nicotine gum/patch/lozenges, chantix.  - I will reassess her progress at the next follow-up visit 5.  Follow up in 4 months.   Metta Clines, DO  CC: Charlott Rakes, MD

## 2018-04-21 ENCOUNTER — Ambulatory Visit (INDEPENDENT_AMBULATORY_CARE_PROVIDER_SITE_OTHER): Payer: Self-pay | Admitting: Neurology

## 2018-04-21 ENCOUNTER — Encounter: Payer: Self-pay | Admitting: Neurology

## 2018-04-21 VITALS — BP 110/76 | HR 99 | Ht 66.5 in | Wt 184.0 lb

## 2018-04-21 DIAGNOSIS — F17200 Nicotine dependence, unspecified, uncomplicated: Secondary | ICD-10-CM

## 2018-04-21 DIAGNOSIS — I10 Essential (primary) hypertension: Secondary | ICD-10-CM

## 2018-04-21 DIAGNOSIS — E785 Hyperlipidemia, unspecified: Secondary | ICD-10-CM

## 2018-04-21 DIAGNOSIS — I63412 Cerebral infarction due to embolism of left middle cerebral artery: Secondary | ICD-10-CM

## 2018-04-21 NOTE — Patient Instructions (Addendum)
1.  Continue both aspirin 81mg  daily and clopidogrel 75mg  daily until November 15, in which you may stop clopidogrel and just continue aspirin 81mg  daily 2.  Continue atorvastatin 20mg  daily 3.  Try to work on quitting smoking.   4.  Repeat lipid panel in 4 months and follow up right afterwards.

## 2018-04-25 ENCOUNTER — Telehealth: Payer: Self-pay

## 2018-04-25 NOTE — Telephone Encounter (Signed)
Alonna Minium, SLP/ADVHC calling requesting HHST 2wk1, 1wk2. Orders approved and given per discharge summary.

## 2018-04-25 NOTE — Progress Notes (Signed)
Patient ID: Tina Moss, female   DOB: 04/07/1971, 47 y.o.   MRN: [id number]      Onesty Clair, is a 47 y.o. female  [id number]:[id number]  STM:[id number]  DOB - 05/26/1971  Subjective:  Chief Complaint and HPI: Tina Moss is a 47 y.o. female here today for a follow up visit After hospitalization 10/22-10/25/2019 for stroke.  She saw Dr Tomi Likens 11/8 for neurology f/up.  See excerpts of his note following discharge summary.  His notes indicate that she DOES NOT have a diagnosis of MS which she was previously diagnosed with.    She does c/o of B leg pain today which has been ongoing for years and seems to be getting worse.  She is not taking anything for this.  She is doing much better and is able to accomplish ADLs on her own.  She had been taken off BP meds previously to be restarted if indicated.  Previously on metoprolol.    From discharge summary: Brief/Interim Summary: 46 year old female with history of anemia, epilepsy, fibroids, mumps, hypertension, hyperlipidemia, seizures presented on 04/04/2018 with slurred speech. Patient was admitted with acute stroke. Neurology was consulted.  Patient underwent LP by neurology on 04/06/2018.  Plavix was started on 04/07/2018 on top of aspirin.  PT recommended CIR.  Patient will be discharged to CIR today.   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 CTA head and neck unremarkable. -2D echoshowed EF of 60 to 65%. TEE done today was normal -Neurology following.  -LDL 121. Continue statin.  Continue aspirin.  Plavix has been started from today onwards as per neurology recommendations.  Outpatient follow-up with  neurology. -PT/OT is recommending CIR.  discharge to CIR today. Diet as per SLP recommendations  Question of multiple sclerosis -Not on any treatment -Status post LP done on 04/06/2018.  Outpatient follow-up with neurology  Seizure disorder -Not on any antiepileptics at home currently.  Chronic anemia -Hemoglobin stable. Outpatient follow-up  Essential hypertension -Monitor blood pressure.   Outpatient follow-up.  Tobacco use -Tobacco cessation encouraged by admitting hospitalist.  04/21/2018 OV with Dr Tomi Likens- HISTORY OF PRESENT ILLNESS: Tina Moss is a 47 year old right-handed African-American woman with hypertension, tobacco use disorder, migraines, anxiety whom I saw last year for evaluation of multiple sclerosis and seizure disorder follows up for recent stroke.  UPDATE: Patient was not seen since September 2018.  EEG from 03/02/17 was normal.  She had MRI of brain, cervical and thoracic spine with and without contrast on 03/10/2017 which was personally reviewed and revealed multiple nonspecific T2 hyperintense foci in the cerebral white matter.  Spinal cord revealed no abnormal signal.  I had recommended lumbar puncture but the patient never had this done and was lost to follow-up.    She was admitted to [facility] on 04/04/2018 for acute stroke.  She presented with expressive aphasia and left sided facial and extremity numbness.  MRI of the brain was personally reviewed and revealed an acute left frontal MCA cortical infarct.  CT of the head and neck revealed no significant intracranial or extracranial arterial stenosis or occlusion.  TTE and TEE revealed normal ejection fraction with no cardiac source of emboli.  Telemetry  revealed no arrhythmia.  LDL was 121.  Hemoglobin A1c was 5.9.  Urine drug screen, HIV and RPR were negative.  ANA, sed rate, CRP, and hypercoagulable panel were unremarkable.  She underwent a lumbar puncture for further evaluation of multiple  sclerosis.  CSF analysis revealed cell count of 1, protein of 34, glucose of 68, no oligoclonal bands, and negative VDRL and cryptococcal antigen.  She was discharged on aspirin 81 mg and Plavix 75 mg daily as well as atorvastatin 20 mg daily.  IMPRESSION: 1.  Acute left MCA cortical infarct, embolic of unknown source 2.  Hypertension 3.  Hyperlipidemia 4.  Tobacco use disorder 5.  Seizure disorder.  Currently not on antiepileptic therapy.  Last seizure was approximately 4 to 5 years ago. 6.  Reported history of multiple sclerosis.  Work-up demonstrates that she does not have multiple sclerosis.  PLAN: 1.  Continue aspirin and Plavix until 11/15, at which point she may discontinue Plavix and continue ASA 81mg  daily alone. 2.  Continue atorvastatin 20mg  daily (LDL goal less than 70).  Repeat lipid panel in 4 months, just prior to follow up. 3.  Continue blood pressure control 4.  Tobacco cessation counseling (CPT 99406):  Tobacco use with history of stroke  - Currently smoking 4 cigarettes some days  - Patient was informed of the dangers of tobacco abuse including stroke, cancer, and MI, as well as benefits of tobacco cessation. - Patient is willing to quit at this time. - Approximately 5 mins were spent counseling patient cessation techniques. We discussed various methods to help quit smoking, including deciding on a date to quit, joining a support group, pharmacological agents- nicotine gum/patch/lozenges, chantix.  - I will reassess her progress at the next follow-up visit 5.  Follow up in 4 months..   ED/Hospital notes reviewed and summarized above.    ROS:   Constitutional:  No f/c, No night sweats, No unexplained weight loss. EENT:  No vision changes, No blurry vision, No hearing changes. No mouth, throat, or ear problems.  Respiratory: No cough, No SOB Cardiac: No CP, no palpitations GI:  No abd pain, No N/V/D. GU: No Urinary s/sx Musculoskeletal: No joint pain Neuro: No  headache, no dizziness, no motor weakness.  Skin: No rash Endocrine:  No polydipsia. No polyuria.  Psych: Denies SI/HI  Problem  Ms (Multiple Sclerosis) ([facility]) (Resolved)    ALLERGIES: No Known Allergies  PAST MEDICAL HISTORY: Past Medical History:  Diagnosis Date  . Anemia   . Epilepsy ([place])   . Fibroids   . H/O bacterial infection   . H/O mumps   . Hyperlipidemia   . Hypertension   . Seizures ([place])     MEDICATIONS AT HOME: Prior to Admission medications   Medication Sig Start Date End Date Taking? Authorizing Provider  aspirin EC 81 MG EC tablet Take 1 tablet (81 mg total) by mouth daily. 04/08/18  Yes Aline August, MD  atorvastatin (LIPITOR) 20 MG tablet Take 1 tablet (20 mg total) by mouth daily at 6 PM. 04/18/18  Yes Angiulli, Lavon Paganini, PA-C  clopidogrel (PLAVIX) 75 MG tablet Take 1 tablet (75 mg total) by mouth daily. 04/18/18  Yes Angiulli, Lavon Paganini, PA-C  famotidine (PEPCID) 10 MG tablet Take 1 tablet (10 mg total) by mouth 2 (two) times daily. 04/18/18  Yes Angiulli, Lavon Paganini, PA-C  metoprolol tartrate (LOPRESSOR) 25 MG tablet Take 1 tablet (25 mg total) by mouth 2 (two) times daily. 04/26/18   Argentina Donovan,  PA-C     Objective:  EXAM:   Vitals:   04/26/18 1338 04/26/18 1341  BP: (!) 152/90 (!) 144/90  Pulse: (!) 103 (!) 102  Resp: 20   Weight: 186 lb 6.4 oz (84.6 kg)   Height: 5' 6.5" (1.689 m)     General appearance : A&OX3. NAD. Non-toxic-appearing HEENT: Atraumatic and Normocephalic.  PERRLA. EOM intact.  Neck: supple, no JVD. No cervical lymphadenopathy. No thyromegaly Chest/Lungs:  Breathing-non-labored, Good air entry bilaterally, breath sounds normal without rales, rhonchi, or wheezing  CVS: S1 S2 regular, no murmurs, gallops, rubs  Extremities: Bilateral Lower Ext shows no edema, both legs are warm to touch with = pulse throughout Neurology:  CN II-XII grossly intact, Non focal.   Psych:  TP linear. J/I WNL. Labored speech but able to  communicate clearly. Appropriate eye contact and blunted affect.  Skin:  No Rash  Data Review Lab Results  Component Value Date   HGBA1C 5.9 (H) 04/05/2018     Assessment & Plan   1. Pain in both lower extremities No visible abnormalities.  Recent sed rate and CRP were WNL.  Try tylenol as needed as she hasn't taken anything for this.  2. Essential hypertension Not controlled-resume metoprolol and titrate dosing as appropriate.  Check BP OOO and record and bring to next visit.   - metoprolol tartrate (LOPRESSOR) 25 MG tablet; Take 1 tablet (25 mg total) by mouth 2 (two) times daily.  Dispense: 180 tablet; Refill: 3  3. Hospital discharge follow-up Improving and has kept f/up appts  4. Acute CVA (cerebrovascular accident) ([place]) Followed by Dr Tomi Likens  5. Tobacco use Continued cessation advised-she is down to 4 cigs per day     Patient have been counseled extensively about nutrition and exercise  Return in about 1 month (around 05/26/2018) for Dr Margarita Rana;  BP and stroke f/up.  The patient was given clear instructions to go to ER or return to medical center if symptoms don't improve, worsen or new problems develop. The patient verbalized understanding. The patient was told to call to get lab results if they haven't heard anything in the next week.     Freeman Caldron, PA-C [facility] and [place], [place]   04/26/2018, 2:02 PM

## 2018-04-26 ENCOUNTER — Ambulatory Visit: Payer: Self-pay | Attending: Family Medicine | Admitting: Physician Assistant

## 2018-04-26 VITALS — BP 144/90 | HR 102 | Resp 20 | Ht 66.5 in | Wt 186.4 lb

## 2018-04-26 DIAGNOSIS — M79605 Pain in left leg: Secondary | ICD-10-CM | POA: Insufficient documentation

## 2018-04-26 DIAGNOSIS — Z7982 Long term (current) use of aspirin: Secondary | ICD-10-CM | POA: Insufficient documentation

## 2018-04-26 DIAGNOSIS — M79604 Pain in right leg: Secondary | ICD-10-CM | POA: Insufficient documentation

## 2018-04-26 DIAGNOSIS — G35 Multiple sclerosis: Secondary | ICD-10-CM | POA: Insufficient documentation

## 2018-04-26 DIAGNOSIS — Z79899 Other long term (current) drug therapy: Secondary | ICD-10-CM | POA: Insufficient documentation

## 2018-04-26 DIAGNOSIS — I639 Cerebral infarction, unspecified: Secondary | ICD-10-CM | POA: Insufficient documentation

## 2018-04-26 DIAGNOSIS — E785 Hyperlipidemia, unspecified: Secondary | ICD-10-CM | POA: Insufficient documentation

## 2018-04-26 DIAGNOSIS — Z8673 Personal history of transient ischemic attack (TIA), and cerebral infarction without residual deficits: Secondary | ICD-10-CM | POA: Insufficient documentation

## 2018-04-26 DIAGNOSIS — G40909 Epilepsy, unspecified, not intractable, without status epilepticus: Secondary | ICD-10-CM | POA: Insufficient documentation

## 2018-04-26 DIAGNOSIS — Z7902 Long term (current) use of antithrombotics/antiplatelets: Secondary | ICD-10-CM | POA: Insufficient documentation

## 2018-04-26 DIAGNOSIS — Z720 Tobacco use: Secondary | ICD-10-CM

## 2018-04-26 DIAGNOSIS — Z09 Encounter for follow-up examination after completed treatment for conditions other than malignant neoplasm: Secondary | ICD-10-CM | POA: Insufficient documentation

## 2018-04-26 DIAGNOSIS — I10 Essential (primary) hypertension: Secondary | ICD-10-CM | POA: Insufficient documentation

## 2018-04-26 DIAGNOSIS — F17210 Nicotine dependence, cigarettes, uncomplicated: Secondary | ICD-10-CM | POA: Insufficient documentation

## 2018-04-26 MED ORDER — METOPROLOL TARTRATE 25 MG PO TABS: 25.0000 mg | ORAL_TABLET | Freq: Two times a day (BID) | ORAL | 3 refills | Status: DC

## 2018-04-26 MED FILL — METOPROLOL TARTRATE 25 MG T: 25 | 30 days supply | Qty: 60 | Fill #0

## 2018-04-26 NOTE — Patient Instructions (Signed)
Check blood pressure and heartrate 2-3 times per week and record and bring to your next visit.

## 2018-04-28 ENCOUNTER — Encounter: Payer: Self-pay | Admitting: Registered Nurse

## 2018-04-28 ENCOUNTER — Encounter: Payer: Self-pay | Attending: Registered Nurse | Admitting: Registered Nurse

## 2018-04-28 VITALS — BP 130/86 | HR 82 | Ht 66.5 in | Wt 188.4 lb

## 2018-04-28 DIAGNOSIS — Z822 Family history of deafness and hearing loss: Secondary | ICD-10-CM | POA: Insufficient documentation

## 2018-04-28 DIAGNOSIS — Z79899 Other long term (current) drug therapy: Secondary | ICD-10-CM | POA: Insufficient documentation

## 2018-04-28 DIAGNOSIS — G40909 Epilepsy, unspecified, not intractable, without status epilepticus: Secondary | ICD-10-CM | POA: Insufficient documentation

## 2018-04-28 DIAGNOSIS — I10 Essential (primary) hypertension: Secondary | ICD-10-CM | POA: Insufficient documentation

## 2018-04-28 DIAGNOSIS — R202 Paresthesia of skin: Secondary | ICD-10-CM | POA: Insufficient documentation

## 2018-04-28 DIAGNOSIS — I63512 Cerebral infarction due to unspecified occlusion or stenosis of left middle cerebral artery: Secondary | ICD-10-CM

## 2018-04-28 DIAGNOSIS — I69398 Other sequelae of cerebral infarction: Secondary | ICD-10-CM | POA: Insufficient documentation

## 2018-04-28 DIAGNOSIS — F17290 Nicotine dependence, other tobacco product, uncomplicated: Secondary | ICD-10-CM | POA: Insufficient documentation

## 2018-04-28 DIAGNOSIS — M25562 Pain in left knee: Secondary | ICD-10-CM | POA: Insufficient documentation

## 2018-04-28 DIAGNOSIS — M25561 Pain in right knee: Secondary | ICD-10-CM | POA: Insufficient documentation

## 2018-04-28 DIAGNOSIS — R200 Anesthesia of skin: Secondary | ICD-10-CM | POA: Insufficient documentation

## 2018-04-28 DIAGNOSIS — Z8249 Family history of ischemic heart disease and other diseases of the circulatory system: Secondary | ICD-10-CM | POA: Insufficient documentation

## 2018-04-28 NOTE — Progress Notes (Signed)
Subjective:    Patient ID: Tina Moss, female    DOB: 08/11/1970, 47 y.o.   MRN: [id number]  HPI: [name] is a 47 year female who is here for transitional care visit in follow up of her left middle cerebral artery stroke, seizure disorder and HTN. She has been home with [facility]. She states she has pain in her bilateral knees. She rates her pain 5.  Reports good appetite.   Pain Inventory Average Pain 4 Pain Right Now 5 My pain is stabbing and aching  In the last 24 hours, has pain interfered with the following? General activity 5 Relation with others 5 Enjoyment of life 5 What TIME of day is your pain at its worst? night Sleep (in general) Fair  Pain is worse with: laying down Pain improves with: pacing activities Relief from Meds: na  Mobility walk without assistance walk with assistance use a walker ability to climb steps?  yes do you drive?  no  Function employed # of hrs/week .  Neuro/Psych numbness tingling trouble walking spasms confusion  Prior Studies Any changes since last visit?  no  Physicians involved in your care Any changes since last visit?  no   Family History  Problem Relation Age of Onset  . Hypertension Mother   . Hypertension Sister   . Arthritis Sister        knees  . Hypertension Brother   . Deafness Brother   . Speech disorder Brother        mute  . Mental illness Brother        "he can just fly off"  . Mental illness Daughter        ? bipolar  . Scoliosis Son    Social History   Socioeconomic History  . Marital status: Divorced    Spouse name: n/a  . Number of children: 2  . Years of education: 11th grade  . Highest education level: Not on file  Occupational History  . Occupation: Housekeeper    Comment: [employer]  Social Needs  . Financial resource strain: Not on file  . Food insecurity:    Worry: Not on file    Inability: Not on file  . Transportation needs:   Medical: Not on file    Non-medical: Not on file  Tobacco Use  . Smoking status: Current Some Day Smoker    Types: Cigars  . Smokeless tobacco: Never Used  . Tobacco comment: "I think I just keep so much on my mind."  Substance and Sexual Activity  . Alcohol use: Yes    Alcohol/week: 0.0 - 3.0 standard drinks    Comment: social  . Drug use: Not Currently    Types: Marijuana    Comment: seldom  . Sexual activity: Not Currently    Partners: Male  Lifestyle  . Physical activity:    Days per week: Not on file    Minutes per session: Not on file  . Stress: Not on file  Relationships  . Social connections:    Talks on phone: Not on file    Gets together: Not on file    Attends religious service: Not on file    Active member of club or organization: Not on file    Attends meetings of clubs or organizations: Not on file    Relationship status: Not on file  Other Topics Concern  . Not on file  Social History Narrative   Lives  with her daughter.   Son is currently incarcerated in [place].   She completed 11th grade, but was kicked out and never when back, as she had become pregnant and was raising her daughter.   Divorced x 2.   Past Surgical History:  Procedure Laterality Date  . CESAREAN SECTION     x2. at term  . TEE WITHOUT CARDIOVERSION N/A 04/06/2018   Procedure: TRANSESOPHAGEAL ECHOCARDIOGRAM (TEE) BUBBLE STUDY;  Surgeon: Lelon Perla, MD;  Location: [facility] ENDOSCOPY;  Service: Cardiovascular;  Laterality: N/A;  . TUBAL LIGATION    . WISDOM TOOTH EXTRACTION     Past Medical History:  Diagnosis Date  . Anemia   . Epilepsy ([place])   . Fibroids   . H/O bacterial infection   . H/O mumps   . Hyperlipidemia   . Hypertension   . Seizures ([facility])    BP 130/86   Pulse 82   Ht 5' 6.5" (1.689 m)   Wt 188 lb 6.4 oz (85.5 kg)   SpO2 98%   BMI 29.95 kg/m   Opioid Risk Score:   Fall Risk Score:  `1  Depression screen PHQ 2/9  Depression screen [facility] 2/9 04/26/2018 03/29/2017  12/22/2016 11/15/2016 10/13/2016 09/16/2016 11/21/2015  Decreased Interest 1 0 2 1 1  0 0  Down, Depressed, Hopeless 2 0 1 2 1  0 0  PHQ - 2 Score 3 0 3 3 2  0 0  Altered sleeping 2 - 1 1 0 0 -  Tired, decreased energy 1 - 2 1 3 2  -  Change in appetite 1 - 3 1 1  0 -  Feeling bad or failure about yourself  1 - 1 1 0 0 -  Trouble concentrating 1 - 1 2 2  0 -  Moving slowly or fidgety/restless 2 - 1 0 1 0 -  Suicidal thoughts 0 - 0 0 0 0 -  PHQ-9 Score 11 - 12 9 9 2  -  Difficult doing work/chores Somewhat difficult - - - Somewhat difficult - -     Review of Systems  Constitutional: Negative.   HENT: Negative.   Eyes: Negative.   Respiratory: Negative.   Cardiovascular: Negative.   Gastrointestinal: Negative.   Endocrine: Negative.   Genitourinary: Negative.   Musculoskeletal: Positive for arthralgias, gait problem and myalgias.  Skin: Negative.   Allergic/Immunologic: Negative.   Neurological: Positive for numbness.  Hematological: Negative.   Psychiatric/Behavioral: Positive for confusion.  All other systems reviewed and are negative.      Objective:   Physical Exam  Constitutional: She is oriented to person, place, and time. She appears well-developed and well-nourished.  HENT:  Head: Normocephalic and atraumatic.  Neck: Normal range of motion. Neck supple.  Cardiovascular: Normal rate and regular rhythm.  Pulmonary/Chest: Effort normal and breath sounds normal.  Musculoskeletal:  Normal Muscle Bulk and Muscle Testing Reveals: Upper Extremities: Full ROM and Muscle Strength 4/5 Lumbar Paraspinal Tenderness: L-4-L-5 Lower Extremities: Full ROM and Muscle Strength 5/5 Right Lower Extremity Flexion Produces Pain into right hip and right Patella Left Lower Extremity Flexion Produces Pain into Patella Arises from chair with ease Narrow Based gait   Neurological: She is alert and oriented to person, place, and time.  Skin: Skin is warm and dry.  Psychiatric: She has a normal mood  and affect. Her behavior is normal.  Nursing note and vitals reviewed.         Assessment & Plan:  1. Left Middle Cerebral artery stroke: Continue Home Health Therapies.  Neurology Following.  2. HTN: Continue current medication regimen. PCP Following.  3. Seizures: Dr. Loretta Plume Following.   minutes of face to face patient care time was spent during this visit. All questions were encouraged and answered.  F/U with Dr. Letta Pate in 4-6 weeks

## 2018-05-01 DIAGNOSIS — Z9119 Patient's noncompliance with other medical treatment and regimen: Secondary | ICD-10-CM

## 2018-05-01 DIAGNOSIS — Z7902 Long term (current) use of antithrombotics/antiplatelets: Secondary | ICD-10-CM

## 2018-05-01 DIAGNOSIS — I69351 Hemiplegia and hemiparesis following cerebral infarction affecting right dominant side: Secondary | ICD-10-CM

## 2018-05-01 DIAGNOSIS — G40909 Epilepsy, unspecified, not intractable, without status epilepticus: Secondary | ICD-10-CM

## 2018-05-01 DIAGNOSIS — E785 Hyperlipidemia, unspecified: Secondary | ICD-10-CM

## 2018-05-01 DIAGNOSIS — I69320 Aphasia following cerebral infarction: Secondary | ICD-10-CM

## 2018-05-01 DIAGNOSIS — Z7982 Long term (current) use of aspirin: Secondary | ICD-10-CM

## 2018-05-01 DIAGNOSIS — F17290 Nicotine dependence, other tobacco product, uncomplicated: Secondary | ICD-10-CM

## 2018-05-01 DIAGNOSIS — D509 Iron deficiency anemia, unspecified: Secondary | ICD-10-CM

## 2018-05-01 DIAGNOSIS — I10 Essential (primary) hypertension: Secondary | ICD-10-CM

## 2018-05-03 ENCOUNTER — Telehealth: Payer: Self-pay

## 2018-05-03 NOTE — Telephone Encounter (Signed)
Tina Moss, SP/[facility] called requesting HHPT eval for pt. Pt initially declined but pt called requesting PT. Orders approved and given per discharge summary.

## 2018-05-08 ENCOUNTER — Telehealth (INDEPENDENT_AMBULATORY_CARE_PROVIDER_SITE_OTHER): Payer: Self-pay | Admitting: Family Medicine

## 2018-05-08 NOTE — Telephone Encounter (Signed)
Will route to PCP for review. 

## 2018-05-08 NOTE — Telephone Encounter (Signed)
[facility] from [facility] called to inform that patient was not home to do her PTE evaluation and had to reschedule for tomorrow.  Cherian [phone]  Thank you Emmit Pomfret

## 2018-05-16 ENCOUNTER — Telehealth: Payer: Self-pay | Admitting: Family Medicine

## 2018-05-16 ENCOUNTER — Telehealth (INDEPENDENT_AMBULATORY_CARE_PROVIDER_SITE_OTHER): Payer: Self-pay | Admitting: Family Medicine

## 2018-05-16 ENCOUNTER — Telehealth: Payer: Self-pay

## 2018-05-16 NOTE — Telephone Encounter (Signed)
Bryson Ha from [place] called to report that Ms. Borden legs are both swollen and painful. States that her right leg is more swollen than the left but both are extremely painful.   Please advice  [place] [phone]  Bryson Ha [phone]  Thank you Emmit Pomfret

## 2018-05-16 NOTE — Telephone Encounter (Signed)
I Tina Moss and the patient  but was unable to reach them. Please inform her to go to the ED to exclude DVT.

## 2018-05-16 NOTE — Telephone Encounter (Signed)
Ebony Hail, OT from [facility] called stating that pt is having pain in left hand at a pain level of 10 and pain in both legs at night with swelling at a pain level past 10. Needs refill on Clopidogrel.

## 2018-05-16 NOTE — Telephone Encounter (Signed)
Will route to PCP for review. 

## 2018-05-16 NOTE — Telephone Encounter (Signed)
[facility] called stating that the patients BP was 148/101. Please fu with patient.

## 2018-05-16 NOTE — Telephone Encounter (Signed)
Her blood pressure has previously been normal.  Please allow patient to rest for 15 minutes and repeat blood pressure

## 2018-05-17 NOTE — Telephone Encounter (Signed)
Either Dr Margarita Rana or Tomi Likens will need to write for clopidigrel When is her appt with me, I haven't seen since D/C? May direct pt to take tylenol 650mg  QID for pain

## 2018-05-17 NOTE — Telephone Encounter (Signed)
Patient was discharged yesterday and a repeat BP was not done. Nurse will have PT call with there next visit.

## 2018-05-17 NOTE — Telephone Encounter (Signed)
Her appt is 12/12.  I informed Ebony Hail that pt needs to contact Dr. Margarita Rana or Tomi Likens for medication and informed her of the Tylenol for pain.

## 2018-05-18 ENCOUNTER — Ambulatory Visit: Payer: Self-pay | Attending: Family Medicine | Admitting: Family Medicine

## 2018-05-18 ENCOUNTER — Encounter: Payer: Self-pay | Admitting: Family Medicine

## 2018-05-18 VITALS — BP 144/91 | HR 83 | Temp 98.0°F | Ht 66.5 in | Wt 189.0 lb

## 2018-05-18 DIAGNOSIS — I69320 Aphasia following cerebral infarction: Secondary | ICD-10-CM | POA: Insufficient documentation

## 2018-05-18 DIAGNOSIS — Z79899 Other long term (current) drug therapy: Secondary | ICD-10-CM | POA: Insufficient documentation

## 2018-05-18 DIAGNOSIS — Z7902 Long term (current) use of antithrombotics/antiplatelets: Secondary | ICD-10-CM | POA: Insufficient documentation

## 2018-05-18 DIAGNOSIS — R600 Localized edema: Secondary | ICD-10-CM | POA: Insufficient documentation

## 2018-05-18 DIAGNOSIS — I10 Essential (primary) hypertension: Secondary | ICD-10-CM | POA: Insufficient documentation

## 2018-05-18 DIAGNOSIS — R4701 Aphasia: Secondary | ICD-10-CM

## 2018-05-18 DIAGNOSIS — E785 Hyperlipidemia, unspecified: Secondary | ICD-10-CM | POA: Insufficient documentation

## 2018-05-18 DIAGNOSIS — I63512 Cerebral infarction due to unspecified occlusion or stenosis of left middle cerebral artery: Secondary | ICD-10-CM

## 2018-05-18 DIAGNOSIS — Z7982 Long term (current) use of aspirin: Secondary | ICD-10-CM | POA: Insufficient documentation

## 2018-05-18 DIAGNOSIS — M79604 Pain in right leg: Secondary | ICD-10-CM | POA: Insufficient documentation

## 2018-05-18 DIAGNOSIS — G40909 Epilepsy, unspecified, not intractable, without status epilepticus: Secondary | ICD-10-CM | POA: Insufficient documentation

## 2018-05-18 DIAGNOSIS — M79605 Pain in left leg: Secondary | ICD-10-CM | POA: Insufficient documentation

## 2018-05-18 MED ORDER — CLOPIDOGREL BISULFATE 75 MG PO TABS: 75.0000 mg | ORAL_TABLET | Freq: Every day | ORAL | 6 refills | Status: DC

## 2018-05-18 MED ORDER — ATORVASTATIN CALCIUM 20 MG PO TABS: 20.0000 mg | ORAL_TABLET | Freq: Every day | ORAL | 6 refills | Status: DC

## 2018-05-18 MED ORDER — FAMOTIDINE 10 MG PO TABS: 10.0000 mg | ORAL_TABLET | Freq: Two times a day (BID) | ORAL | 6 refills | Status: DC

## 2018-05-18 MED ORDER — GABAPENTIN 300 MG PO CAPS: 300.0000 mg | ORAL_CAPSULE | Freq: Two times a day (BID) | ORAL | 3 refills | Status: DC

## 2018-05-18 MED ORDER — FUROSEMIDE 20 MG PO TABS: 20.0000 mg | ORAL_TABLET | Freq: Every day | ORAL | 0 refills | Status: DC

## 2018-05-18 MED ORDER — METOPROLOL TARTRATE 50 MG PO TABS: 50.0000 mg | ORAL_TABLET | Freq: Two times a day (BID) | ORAL | 6 refills | Status: DC

## 2018-05-18 MED FILL — CLOPIDOGREL 75 MG TABLET: 75 | 30 days supply | Qty: 30 | Fill #0

## 2018-05-18 MED FILL — ATORVASTATIN 20 MG TABLET: 20 | 30 days supply | Qty: 30 | Fill #0

## 2018-05-18 MED FILL — FUROSEMIDE 20 MG TABLET: 20 | 5 days supply | Qty: 5 | Fill #0

## 2018-05-18 MED FILL — METOPROLOL TARTRATE 50 MG T: 50 | 30 days supply | Qty: 60 | Fill #0

## 2018-05-18 MED FILL — GABAPENTIN 300 MG CAPSULE: 300 | 30 days supply | Qty: 60 | Fill #0

## 2018-05-18 NOTE — Progress Notes (Signed)
Subjective:  Patient ID: Tina Moss, female    DOB: 03-12-1971  Age: 47 y.o. MRN: [id number]  CC: Leg Pain   HPI Tina Moss Is a 47 year old female with a history of seizures (seizure - free for the last 5 years and is not currently on antiseizure medication), multiple sclerosis, L MCA CVA in 03/2017  who presents today for follow-up visit. She complains of bilateral leg pain worse at night, right greater than left With associated vibration sensation which feel like her legs "are going to sleep". Also complains of RLE swelling and feels knots on her right leg. She denies weakness of her extremities. Undergoing home PT and is also followed Rehab Medicine and Neurology. She does have numbness on the tips of her fingers. Her blood pressure is elevated and she endorses compliance with her antihypertensive.  Past Medical History:  Diagnosis Date  . Anemia   . Epilepsy ([place])   . Fibroids   . H/O bacterial infection   . H/O mumps   . Hyperlipidemia   . Hypertension   . Seizures ([place])     Past Surgical History:  Procedure Laterality Date  . CESAREAN SECTION     x2. at term  . TEE WITHOUT CARDIOVERSION N/A 04/06/2018   Procedure: TRANSESOPHAGEAL ECHOCARDIOGRAM (TEE) BUBBLE STUDY;  Surgeon: Lelon Perla, MD;  Location: [facility] ENDOSCOPY;  Service: Cardiovascular;  Laterality: N/A;  . TUBAL LIGATION    . WISDOM TOOTH EXTRACTION      No Known Allergies   Outpatient Medications Prior to Visit  Medication Sig Dispense Refill  . aspirin EC 81 MG EC tablet Take 1 tablet (81 mg total) by mouth daily. 30 tablet 0  . atorvastatin (LIPITOR) 20 MG tablet Take 1 tablet (20 mg total) by mouth daily at 6 PM. 30 tablet 0  . clopidogrel (PLAVIX) 75 MG tablet Take 1 tablet (75 mg total) by mouth daily. 30 tablet 0  . famotidine (PEPCID) 10 MG tablet Take 1 tablet (10 mg total) by mouth 2 (two) times daily. 60 tablet 0  . metoprolol tartrate (LOPRESSOR) 25 MG tablet Take 1 tablet (25 mg  total) by mouth 2 (two) times daily. 180 tablet 3   No facility-administered medications prior to visit.     ROS Review of Systems  Constitutional: Negative for activity change, appetite change and fatigue.  HENT: Negative for congestion, sinus pressure and sore throat.   Eyes: Negative for visual disturbance.  Respiratory: Negative for cough, chest tightness, shortness of breath and wheezing.   Cardiovascular: Negative for chest pain and palpitations.  Gastrointestinal: Negative for abdominal distention, abdominal pain and constipation.  Endocrine: Negative for polydipsia.  Genitourinary: Negative for dysuria and frequency.  Musculoskeletal:       See hpi  Skin: Negative for rash.  Neurological: Positive for numbness. Negative for tremors and light-headedness.  Hematological: Does not bruise/bleed easily.  Psychiatric/Behavioral: Negative for agitation and behavioral problems.    Objective:  BP (!) 144/91   Pulse 83   Temp 98 F (36.7 C) (Oral)   Ht 5' 6.5" (1.689 m)   Wt 189 lb (85.7 kg)   SpO2 98%   BMI 30.05 kg/m   BP/Weight 05/18/2018 04/28/2018 09/38/1829  Systolic BP 937 169 678  Diastolic BP 91 86 90  Wt. (Lbs) 189 188.4 186.4  BMI 30.05 29.95 29.64      Physical Exam  Constitutional: She is oriented to person, place, and time. She appears well-developed and well-nourished.  Cardiovascular:  Normal rate, normal heart sounds and intact distal pulses.  No murmur heard. Pulmonary/Chest: Effort normal and breath sounds normal. She has no wheezes. She has no rales. She exhibits no tenderness.  Abdominal: Soft. Bowel sounds are normal. She exhibits no distension and no mass. There is no tenderness.  Musculoskeletal: Normal range of motion. She exhibits edema (1+ RLE).  Negative Homan's sign  Neurological: She is alert and oriented to person, place, and time.  Skin: Skin is warm and dry.  Psychiatric: She has a normal mood and affect.    CMP Latest Ref Rng &  Units 04/10/2018 04/06/2018 04/04/2018  Glucose 70 - 99 mg/dL 109(H) 108(H) 84  BUN 6 - 20 mg/dL 10 12 10   Creatinine 0.44 - 1.00 mg/dL 0.61 0.62 0.40(L)  Sodium 135 - 145 mmol/L 137 139 144  Potassium 3.5 - 5.1 mmol/L 3.4(L) 3.6 4.1  Chloride 98 - 111 mmol/L 105 107 112(H)  CO2 22 - 32 mmol/L 26 26 -  Calcium 8.9 - 10.3 mg/dL 9.5 9.4 -  Total Protein 6.5 - 8.1 g/dL 7.4 - -  Total Bilirubin 0.3 - 1.2 mg/dL 0.5 - -  Alkaline Phos 38 - 126 U/L 89 - -  AST 15 - 41 U/L 23 - -  ALT 0 - 44 U/L 25 - -     Assessment & Plan:   1. Essential hypertension Slightly elevated Increase Metoprolol dose - metoprolol tartrate (LOPRESSOR) 50 MG tablet; Take 1 tablet (50 mg total) by mouth 2 (two) times daily.  Dispense: 60 tablet; Refill: 6  2. Left middle cerebral artery stroke (HCC) Slight residual aphasia Risk factor modification Continue home PT Risk factor modification - atorvastatin (LIPITOR) 20 MG tablet; Take 1 tablet (20 mg total) by mouth daily at 6 PM.  Dispense: 30 tablet; Refill: 6 - clopidogrel (PLAVIX) 75 MG tablet; Take 1 tablet (75 mg total) by mouth daily.  Dispense: 30 tablet; Refill: 6  3. Expressive aphasia Completed speech therapy  4. Pain in both lower extremities - gabapentin (NEURONTIN) 300 MG capsule; Take 1 capsule (300 mg total) by mouth 2 (two) times daily.  Dispense: 60 capsule; Refill: 3  5. Pedal edema Decrease sodium intake, elevate feet - furosemide (LASIX) 20 MG tablet; Take 1 tablet (20 mg total) by mouth daily.  Dispense: 5 tablet; Refill: 0   Meds ordered this encounter  Medications  . metoprolol tartrate (LOPRESSOR) 50 MG tablet    Sig: Take 1 tablet (50 mg total) by mouth 2 (two) times daily.    Dispense:  60 tablet    Refill:  6    Discontinue previous dose  . gabapentin (NEURONTIN) 300 MG capsule    Sig: Take 1 capsule (300 mg total) by mouth 2 (two) times daily.    Dispense:  60 capsule    Refill:  3  . atorvastatin (LIPITOR) 20 MG tablet      Sig: Take 1 tablet (20 mg total) by mouth daily at 6 PM.    Dispense:  30 tablet    Refill:  6  . clopidogrel (PLAVIX) 75 MG tablet    Sig: Take 1 tablet (75 mg total) by mouth daily.    Dispense:  30 tablet    Refill:  6  . furosemide (LASIX) 20 MG tablet    Sig: Take 1 tablet (20 mg total) by mouth daily.    Dispense:  5 tablet    Refill:  0  . famotidine (PEPCID) 10 MG tablet  Sig: Take 1 tablet (10 mg total) by mouth 2 (two) times daily.    Dispense:  60 tablet    Refill:  6    Follow-up: Return in about 3 months (around 08/17/2018) for Follow-up of chronic medical conditions.   Charlott Rakes MD

## 2018-05-22 NOTE — Telephone Encounter (Signed)
Patient was seen at [facility]

## 2018-05-25 ENCOUNTER — Ambulatory Visit: Payer: Self-pay | Admitting: Physical Medicine & Rehabilitation

## 2018-05-25 ENCOUNTER — Encounter: Payer: Self-pay | Attending: Registered Nurse

## 2018-05-25 DIAGNOSIS — Z822 Family history of deafness and hearing loss: Secondary | ICD-10-CM | POA: Insufficient documentation

## 2018-05-25 DIAGNOSIS — G40909 Epilepsy, unspecified, not intractable, without status epilepticus: Secondary | ICD-10-CM | POA: Insufficient documentation

## 2018-05-25 DIAGNOSIS — M25561 Pain in right knee: Secondary | ICD-10-CM | POA: Insufficient documentation

## 2018-05-25 DIAGNOSIS — M25562 Pain in left knee: Secondary | ICD-10-CM | POA: Insufficient documentation

## 2018-05-25 DIAGNOSIS — F17290 Nicotine dependence, other tobacco product, uncomplicated: Secondary | ICD-10-CM | POA: Insufficient documentation

## 2018-05-25 DIAGNOSIS — R200 Anesthesia of skin: Secondary | ICD-10-CM | POA: Insufficient documentation

## 2018-05-25 DIAGNOSIS — I69398 Other sequelae of cerebral infarction: Secondary | ICD-10-CM | POA: Insufficient documentation

## 2018-05-25 DIAGNOSIS — I10 Essential (primary) hypertension: Secondary | ICD-10-CM | POA: Insufficient documentation

## 2018-05-25 DIAGNOSIS — Z79899 Other long term (current) drug therapy: Secondary | ICD-10-CM | POA: Insufficient documentation

## 2018-05-25 DIAGNOSIS — R202 Paresthesia of skin: Secondary | ICD-10-CM | POA: Insufficient documentation

## 2018-05-25 DIAGNOSIS — Z8249 Family history of ischemic heart disease and other diseases of the circulatory system: Secondary | ICD-10-CM | POA: Insufficient documentation

## 2018-05-30 IMAGING — MR MR THORACIC SPINE WO/W CM
25 of 30 series · 31 of 48 positions shown · IV contrast (15 ml Multihance)
Comparison: Head CT 09/15/2016

CLINICAL DATA: Memory loss. History of seizure disorder. Bilateral
visual floaters. Numbness and weakness in the hands and legs.
Possible multiple sclerosis.

Creatinine was obtained on site at [HOSPITAL] at [HOSPITAL].
Results: Creatinine 0.8 mg/dL.
EXAM:
MRI HEAD WITHOUT AND WITH CONTRAST
MRI CERVICAL SPINE WITHOUT AND WITH CONTRAST
MRI THORACIC SPINE WITHOUT AND WITH CONTRAST
TECHNIQUE: Multiplanar, multiecho pulse sequences of the brain and surrounding
structures, cervical spine, to include the craniocervical junction
and cervicothoracic junction, and thoracic spine were obtained
without and with intravenous contrast.
CONTRAST:  15mL MULTIHANCE GADOBENATE DIMEGLUMINE 529 MG/ML IV SOLN

[Series 2: t1_se_sag · sagittal · 5.0mm · 0.45mm/px · 1 of 21 slices shown]
[im 1/21]
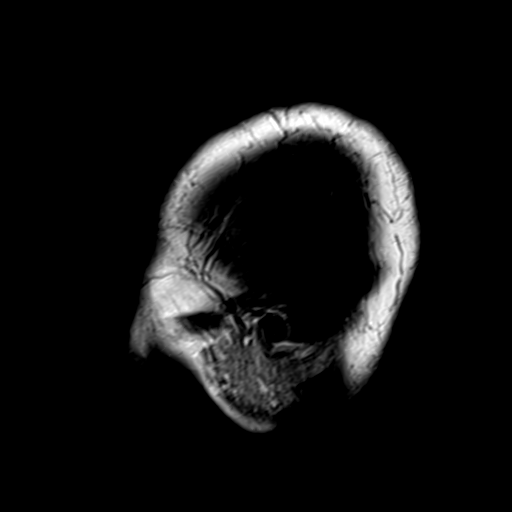

[Series 3: ep2d_diff_(id)_trace · axial · 3.0mm · 1.80mm/px · z∈[-50,+91]mm · 3 of 96 slices shown]
[im 1/96]
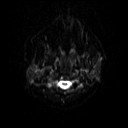
[im 48/96]
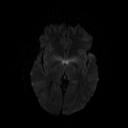
[im 96/96]
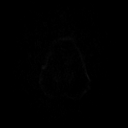

[Series 4: ep2d_diff_(id)_trace_adc · axial · 3.0mm · 1.80mm/px · z∈[-50,+91]mm · 2 of 48 slices shown]
[im 1/48]
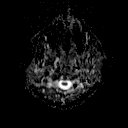
[im 48/48]
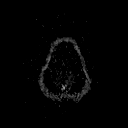

[Series 5: ep2d_diff_cor · coronal · 5.0mm · 1.77mm/px · 2 of 47 slices shown]
[im 1/47]
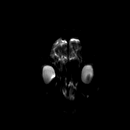
[im 47/47]
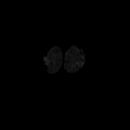

[Series 6: ep2d_diff_cor_adc · coronal · 5.0mm · 1.77mm/px · 1 of 24 slices shown]
[im 1/24]
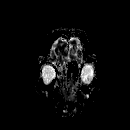

[Series 8: swi_images · axial · 2.0mm · 0.90mm/px · z∈[-58,+100]mm · 3 of 80 slices shown]
[im 1/80]
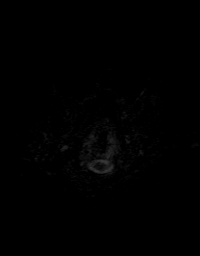
[im 40/80]
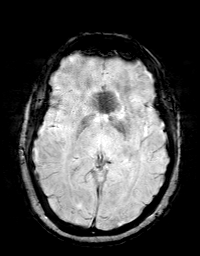
[im 80/80]
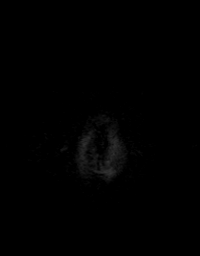

[Series 9: FLAIR · axial · 3.0mm · 0.43mm/px · 1 of 27 slices shown]
[im 1/27]
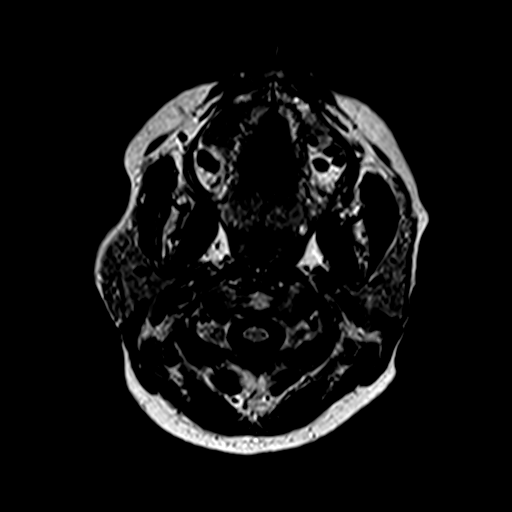

[Series 10: t2_tse_tra_512 · axial · 5.0mm · 0.60mm/px · 1 of 24 slices shown]
[im 1/24]
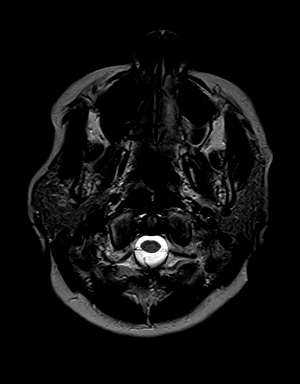

[Series 11: t1_mpr_tra · axial · 1.0mm · 0.72mm/px · 1 of 160 slices shown]
[im 1/160]
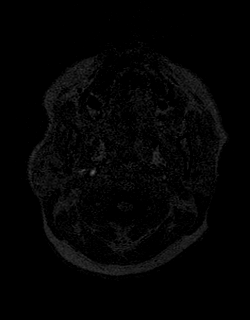

[Series 12: T2 · oblique · 3.0mm · 0.56mm/px · 1 of 27 slices shown (1 of 6)]
[im 1/27]
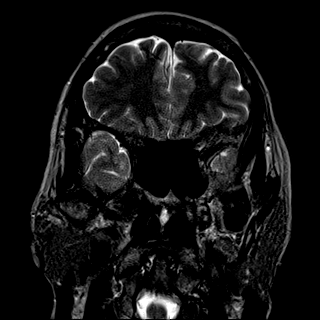

[Series 15: T1 · sagittal · 3.0mm · 0.41mm/px · 1 of 13 slices shown (1 of 5)]
[im 1/13]
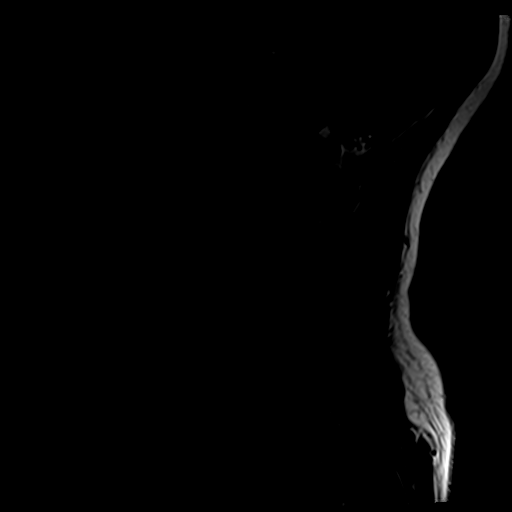

[Series 17: T2 · axial · 3.0mm · 0.70mm/px · 1 of 25 slices shown (2 of 6)]
[im 1/25]
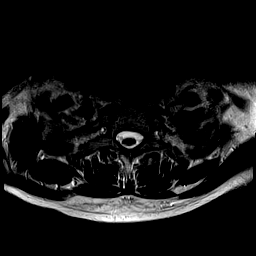

[Series 18: T1 · axial · 3.0mm · 0.35mm/px · 1 of 25 slices shown (2 of 5)]
[im 1/25]
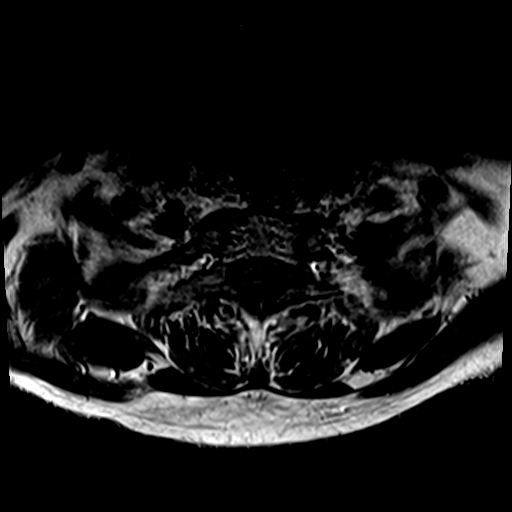

[Series 22: STIR · sagittal · 3.0mm · 1.06mm/px · 1 of 13 slices shown]
[im 1/13]
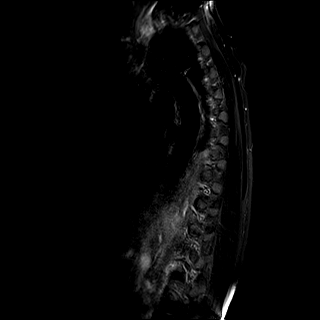

[Series 23: T1 · sagittal · 3.0mm · 0.53mm/px · 1 of 13 slices shown (3 of 5)]
[im 1/13]
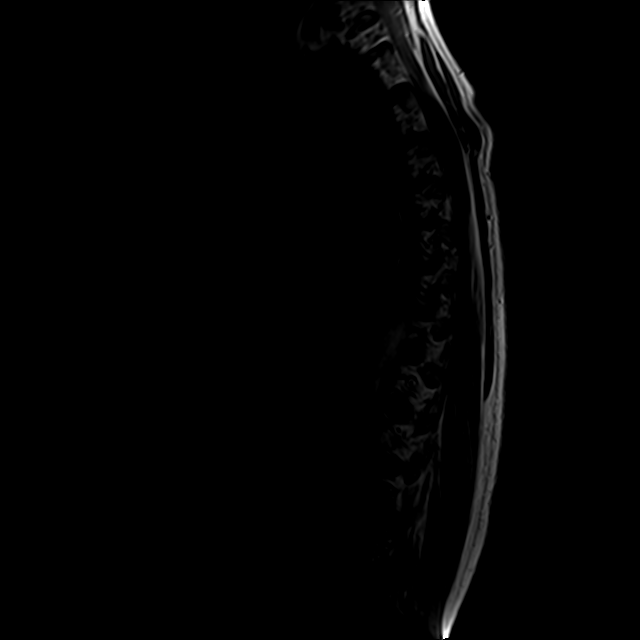

[Series 24: T2 · axial · 4.0mm · 0.39mm/px · 1 of 31 slices shown (3 of 6)]
[im 1/31]
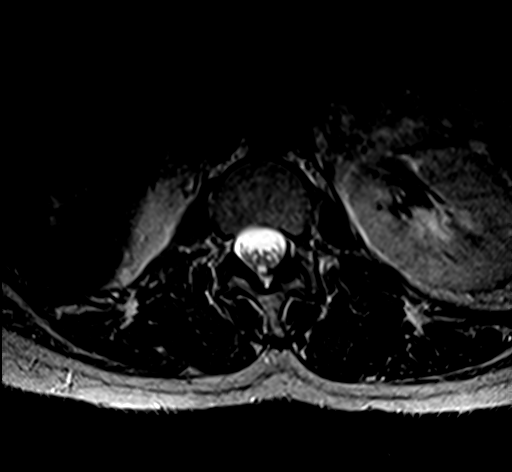

[Series 26: T1 · axial · 4.0mm · 0.39mm/px · 1 of 31 slices shown (4 of 5)]
[im 1/31]
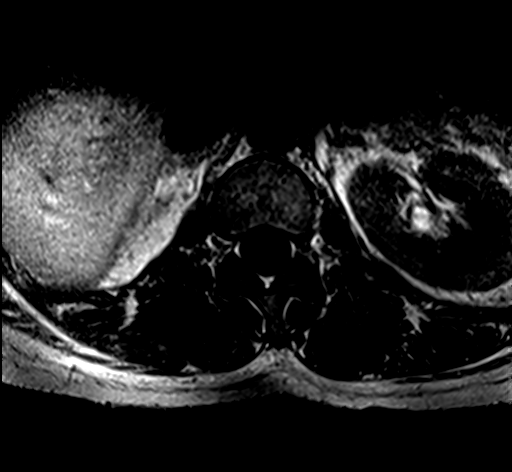

[Series 27: T2 · sagittal · 3.0mm · 0.53mm/px · 1 of 13 slices shown (4 of 6)]
[im 1/13]
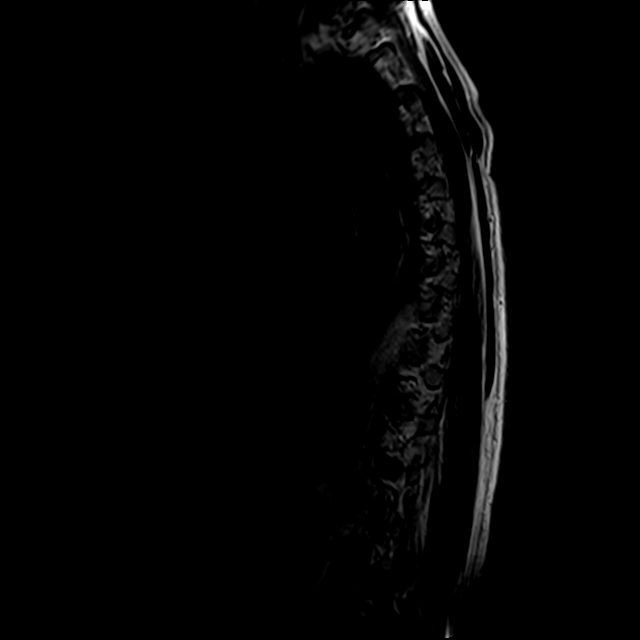

[Series 28: T1 post-contrast · sagittal · 3.0mm · 0.53mm/px · 1 of 13 slices shown (1 of 3)]
[im 1/13]
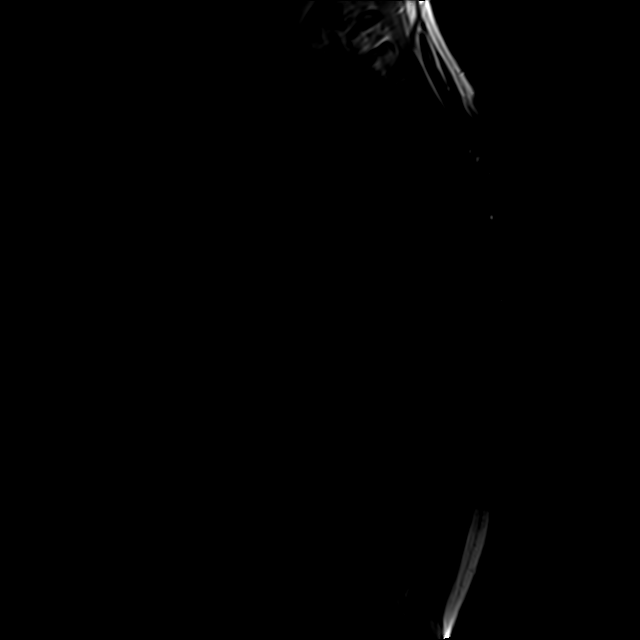

[Series 29: T1 · axial · 4.0mm · 0.39mm/px · 1 of 31 slices shown (5 of 5)]
[im 1/31]
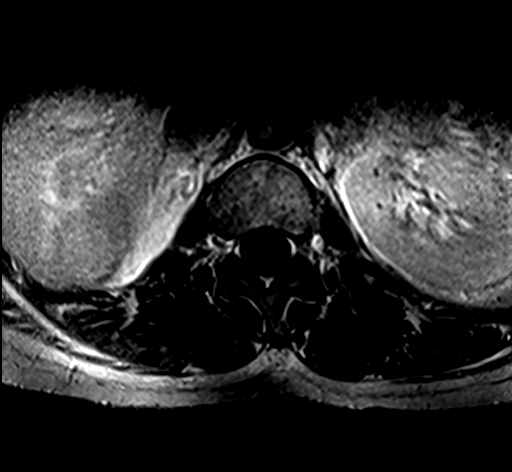

[Series 30: T2 · sagittal · 3.0mm · 0.66mm/px · 1 of 13 slices shown (5 of 6)]
[im 1/13]
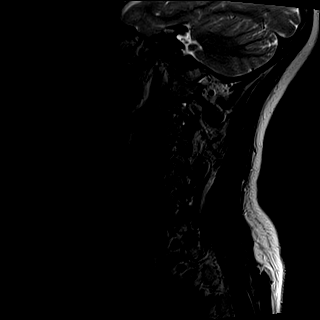

[Series 31: T1 fat-sat post-contrast · sagittal · 3.0mm · 0.82mm/px · 1 of 13 slices shown]
[im 1/13]
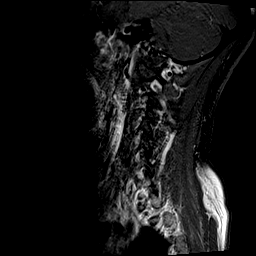

[Series 32: T1 post-contrast · axial · 3.0mm · 0.35mm/px · 1 of 25 slices shown (2 of 3)]
[im 1/25]
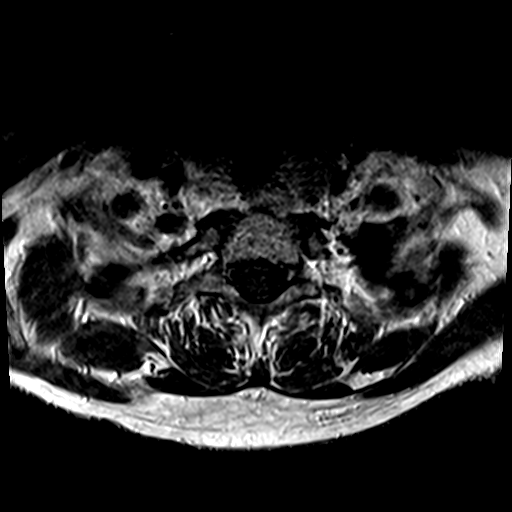

[Series 33: T2 · coronal · 5.0mm · 0.45mm/px · 1 of 26 slices shown (6 of 6)]
[im 1/26]
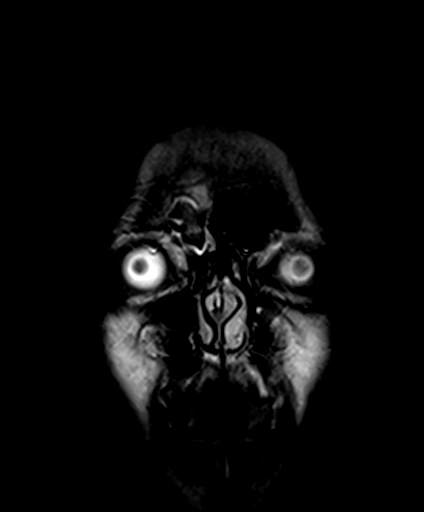

[Series 34: T1 post-contrast · coronal · 5.0mm · 0.72mm/px · 1 of 26 slices shown (3 of 3)]
[im 1/26]
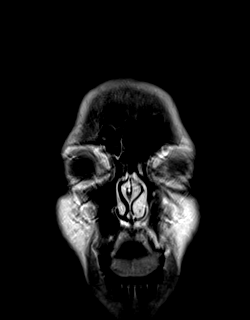

[31 of 48 positions shown; findings below may reference images not displayed]

FINDINGS: MRI HEAD FINDINGS

Brain: There is no evidence of acute infarct, intracranial
hemorrhage, mass, midline shift, or extra-axial fluid collection.
The ventricles and sulci are normal. There are 10-15 foci of T2
hyperintensity in the cerebral white matter. These are predominantly
arranged in a parasagittal orientation in the centrum semiovale
bilaterally. Multiple lesions demonstrate frank encephalomalacia.
There is no involvement of the corpus callosum, brainstem, or
cerebellum. No lesions demonstrate restricted diffusion or abnormal
enhancement.

Vascular: Major intracranial vascular flow voids are preserved.

Skull and upper cervical spine: No suspicious marrow lesion.

Sinuses/Orbits: Unremarkable orbits. Mild right frontal sinus
mucosal thickening. Clear mastoid air cells. Small Tornwaldt cyst.

Other: None.

MRI CERVICAL SPINE FINDINGS

Alignment: Cervical spine straightening.  No listhesis.

Vertebrae: No fracture or suspicious osseous lesion. Mild
degenerative endplate edema and enhancement at C4-5.

Cord: Normal signal and morphology.  No abnormal enhancement.

Posterior Fossa, vertebral arteries, paraspinal tissues:
Unremarkable.

Disc levels: Disc desiccation from C2-3 to C6-7. Moderate disc space
narrowing at C3-4 with mild narrowing from C4-5 to C6-7.

C2-3: Minimal disc bulging and minimal uncovertebral spurring
without stenosis.

C3-4: Disc bulging and uncovertebral spurring without significant
stenosis.

C4-5: Disc bulging and uncovertebral spurring without significant
stenosis.

C5-6: Disc bulging and uncovertebral spurring result in mild spinal
stenosis without neural foraminal stenosis or spinal cord mass
effect.

C6-7:  Disc bulging without significant stenosis.

C7-T1: Mild left facet arthrosis without disc herniation or
stenosis.

MRI THORACIC SPINE FINDINGS

Alignment: Minimal right convex curvature of the midthoracic spine.
No listhesis.

Vertebrae: No fracture, osseous lesion, or significant marrow edema.

Cord:  Normal signal and morphology.  No abnormal enhancement.

Paraspinal and other soft tissues: Unremarkable.

Disc levels: Tiny central disc protrusion at T5-6 and tiny left
paracentral disc protrusion at T7-8. Mild multilevel facet
arthrosis. No spinal cord mass effect, spinal stenosis, or neural
foraminal stenosis.
IMPRESSION: 1. Multiple foci of cerebral white matter T2 hyperintensity,
abnormal for age. This may reflect chronic small vessel ischemia
with multiple chronic lacunar infarcts (deep MCA watershed).
Demyelinating disease is possible, however there are no lesions
specific for multiple sclerosis.
2. Normal appearance of the cervical and thoracic spinal cord
without evidence of demyelinating disease.
3. Mild disc and facet degeneration in the cervical and thoracic
spine as above. No high-grade stenosis.

## 2018-05-31 ENCOUNTER — Ambulatory Visit: Payer: Self-pay | Attending: Family Medicine

## 2018-06-05 ENCOUNTER — Ambulatory Visit: Payer: Self-pay | Admitting: Family Medicine

## 2018-06-16 ENCOUNTER — Telehealth: Payer: Self-pay | Admitting: *Deleted

## 2018-06-16 ENCOUNTER — Telehealth: Payer: Self-pay | Admitting: Family Medicine

## 2018-06-16 NOTE — Telephone Encounter (Signed)
Will route to PCP for review. 

## 2018-06-16 NOTE — Telephone Encounter (Signed)
Patient left a message asking for a call back, reason unspecified.  I attempted to contact patient.  It went to voicemail.  Mail box full.

## 2018-06-16 NOTE — Telephone Encounter (Signed)
Patient called because she would like to be put back to work. Patient was put out of work by Dr.Kirstein due to having a stroke. She would like to know what she would need to do in order to go back to work. Please follow up.

## 2018-06-19 NOTE — Telephone Encounter (Signed)
Rehab Medicine - dr Letta Pate put her out of work, I would recommend she see him to determine return back to work.

## 2018-06-20 ENCOUNTER — Ambulatory Visit: Payer: Self-pay | Admitting: Physical Medicine & Rehabilitation

## 2018-06-20 ENCOUNTER — Encounter: Payer: Self-pay | Admitting: Physical Medicine & Rehabilitation

## 2018-06-20 ENCOUNTER — Ambulatory Visit: Payer: Self-pay

## 2018-06-20 ENCOUNTER — Telehealth: Payer: Self-pay

## 2018-06-20 NOTE — Telephone Encounter (Signed)
Patient called requesting a return to work note. Stated does have a follow up appointment here on 07-03-2018

## 2018-06-20 NOTE — Telephone Encounter (Signed)
Patient was called and informed to contact doctor that put her out of work.

## 2018-06-20 NOTE — Telephone Encounter (Signed)
Printed ok to be out until my eval

## 2018-06-21 NOTE — Telephone Encounter (Signed)
Note placed at front desk and patient notified to come pick up

## 2018-06-28 ENCOUNTER — Ambulatory Visit: Payer: Self-pay | Admitting: Family Medicine

## 2018-06-28 ENCOUNTER — Ambulatory Visit: Payer: Self-pay | Attending: Family Medicine | Admitting: Family Medicine

## 2018-06-28 ENCOUNTER — Encounter: Payer: Self-pay | Admitting: Family Medicine

## 2018-06-28 VITALS — BP 124/78 | HR 80 | Temp 98.4°F | Ht 66.5 in | Wt 192.0 lb

## 2018-06-28 DIAGNOSIS — I63512 Cerebral infarction due to unspecified occlusion or stenosis of left middle cerebral artery: Secondary | ICD-10-CM

## 2018-06-28 DIAGNOSIS — M79604 Pain in right leg: Secondary | ICD-10-CM

## 2018-06-28 DIAGNOSIS — M79605 Pain in left leg: Secondary | ICD-10-CM

## 2018-06-28 NOTE — Progress Notes (Signed)
Subjective:  Patient ID: Tina Moss, female    DOB: Jun 28, 1970  Age: 48 y.o. MRN: [id number]  CC: Hypertension   HPI CONCEPTION DOEBLER s a 48 year old female with a history of seizures (seizure - free for the last 5 years and is not currently on antiseizure medication), multiple sclerosis, L MCA CVA in 03/2017  who presents today for follow-up visit. She had complained of bilateral leg pain worse at night, right greater than left for which she was commenced on gabapentin at her last visit and reported improvement in symptoms. She sometimes has intermittent right knee pain. 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 She needs to return to work to be able to pay her bills.  She is unable to keep appointment with rehab as she is required to pay out-of-pocket as she has no insurance.  Past Medical History:  Diagnosis Date  . Anemia   . Epilepsy ([place])   . Fibroids   . H/O bacterial infection   . H/O mumps   . Hyperlipidemia   . Hypertension   . Seizures ([place])     Past Surgical History:  Procedure Laterality Date  . CESAREAN SECTION     x2. at term  . TEE WITHOUT CARDIOVERSION N/A 04/06/2018   Procedure: TRANSESOPHAGEAL ECHOCARDIOGRAM (TEE) BUBBLE STUDY;  Surgeon: Lelon Perla, MD;  Location: [facility] ENDOSCOPY;  Service: Cardiovascular;  Laterality: N/A;  . TUBAL LIGATION    . WISDOM TOOTH EXTRACTION      No Known Allergies    Outpatient Medications Prior to Visit  Medication Sig Dispense Refill  . atorvastatin (LIPITOR) 20 MG tablet Take 1 tablet (20 mg total) by mouth daily at 6 PM. 30 tablet 6  . clopidogrel (PLAVIX) 75 MG tablet Take 1 tablet (75 mg total) by mouth daily. 30 tablet 6  . famotidine (PEPCID) 10 MG tablet Take 1 tablet (10 mg total) by mouth 2 (two) times daily. 60 tablet 6  . furosemide (LASIX) 20 MG tablet Take 1 tablet (20 mg total) by mouth  daily. 5 tablet 0  . gabapentin (NEURONTIN) 300 MG capsule Take 1 capsule (300 mg total) by mouth 2 (two) times daily. 60 capsule 3  . metoprolol tartrate (LOPRESSOR) 50 MG tablet Take 1 tablet (50 mg total) by mouth 2 (two) times daily. 60 tablet 6  . aspirin EC 81 MG EC tablet Take 1 tablet (81 mg total) by mouth daily. (Patient not taking: Reported on 06/28/2018) 30 tablet 0   No facility-administered medications prior to visit.     ROS Review of Systems  Constitutional: Negative for activity change, appetite change and fatigue.  HENT: Negative for congestion, sinus pressure and sore throat.   Eyes: Negative for visual disturbance.  Respiratory: Negative for cough, chest tightness, shortness of breath and wheezing.   Cardiovascular: Negative for chest pain and palpitations.  Gastrointestinal: Negative for abdominal distention, abdominal pain and constipation.  Endocrine: Negative for polydipsia.  Genitourinary: Negative for dysuria and frequency.  Musculoskeletal:       See hpi  Skin: Negative for rash.  Neurological: Negative for tremors, light-headedness and numbness.  Hematological: Does not bruise/bleed easily.  Psychiatric/Behavioral: Negative for agitation and behavioral problems.    Objective:  BP 124/78   Pulse 80   Temp 98.4 F (36.9 C) (Oral)   Ht 5' 6.5" (1.689  m)   Wt 192 lb (87.1 kg)   SpO2 99%   BMI 30.53 kg/m   BP/Weight 06/28/2018 05/18/2018 57/06/7791  Systolic BP 903 009 233  Diastolic BP 78 91 86  Wt. (Lbs) 192 189 188.4  BMI 30.53 30.05 29.95      Physical Exam Constitutional:      Appearance: She is well-developed.  Cardiovascular:     Rate and Rhythm: Normal rate.     Heart sounds: Normal heart sounds. No murmur.  Pulmonary:     Effort: Pulmonary effort is normal.     Breath sounds: Normal breath sounds. No wheezing or rales.  Chest:     Chest wall: No tenderness.  Abdominal:     General: Bowel sounds are normal. There is no distension.       Palpations: Abdomen is soft. There is no mass.     Tenderness: There is no abdominal tenderness.  Musculoskeletal: Normal range of motion.  Neurological:     Mental Status: She is alert and oriented to person, place, and time.      Assessment & Plan:   1. 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 Pain of right lower extremity Improved on gabapentin   No orders of the defined types were placed in this encounter.   Follow-up: Return for Follow-up of chronic medical conditions, keep previously scheduled appointment.   Charlott Rakes MD

## 2018-07-03 ENCOUNTER — Ambulatory Visit: Payer: Self-pay | Admitting: Physical Medicine & Rehabilitation

## 2018-07-03 ENCOUNTER — Encounter

## 2018-08-03 ENCOUNTER — Ambulatory Visit: Payer: Self-pay

## 2018-08-09 ENCOUNTER — Ambulatory Visit: Payer: Self-pay | Attending: Family Medicine | Admitting: Physician Assistant

## 2018-08-09 VITALS — BP 133/88 | HR 100 | Temp 98.2°F | Resp 16 | Wt 192.6 lb

## 2018-08-09 DIAGNOSIS — F419 Anxiety disorder, unspecified: Secondary | ICD-10-CM

## 2018-08-09 DIAGNOSIS — D219 Benign neoplasm of connective and other soft tissue, unspecified: Secondary | ICD-10-CM

## 2018-08-09 DIAGNOSIS — D500 Iron deficiency anemia secondary to blood loss (chronic): Secondary | ICD-10-CM

## 2018-08-09 DIAGNOSIS — N921 Excessive and frequent menstruation with irregular cycle: Secondary | ICD-10-CM

## 2018-08-09 DIAGNOSIS — I10 Essential (primary) hypertension: Secondary | ICD-10-CM

## 2018-08-09 LAB — POCT URINE PREGNANCY: Preg Test, Ur: NEGATIVE

## 2018-08-09 MED ORDER — FERROUS SULFATE 325 (65 FE) MG PO TABS: 325.0000 mg | ORAL_TABLET | Freq: Two times a day (BID) | ORAL | 3 refills | Status: DC

## 2018-08-09 MED FILL — FERROUS SULFATE 325 MG TAB: 325 (65 FE) | 30 days supply | Qty: 60 | Fill #0

## 2018-08-09 NOTE — Progress Notes (Signed)
Patient ID: Tina Moss, female   DOB: 16-Apr-1971, 48 y.o.   MRN: [id number]   Tina Moss, is a 48 y.o. female  KVQ:[id number]  IEP:[id number]  DOB - [birth year]  Subjective:  Chief Complaint and HPI: Tina Moss is a 48 y.o. female here today with very heavy bleeding a couple of weeks ago that has stopped.  The vaginal bleeding was extremely heavy and saturating through thick pads and she was passing clots.  It lasted for 2 weeks.  She has a h/o known fibroids.  She did not have a period from about October to December 2019.  Then had a regular period(which are usu light and last a few days) in January and February then this very heavy episode of bleeding.  She has not been taking iron.  Does not drink a lot of water.  No SOB, CP, or tinnitus.  1 sex partner.  Some vaginal d/c since bleeding episode.  No abdominal pain or cramping.  No f/c.  No urinary s/sx.  Has had BTL  Some anxiety.  No acute safety issues.  No SI/HI.  Worries a lot.  Obsesses over problems.  Does have family support.  No regular exercise/self-care   ROS:   Constitutional:  No f/c, No night sweats, No unexplained weight loss. EENT:  No vision changes, No blurry vision, No hearing changes. No mouth, throat, or ear problems.  Respiratory: No cough, No SOB Cardiac: No CP, no palpitations GI:  No abd pain, No N/V/D. GU: No Urinary s/sx Musculoskeletal: No joint pain Neuro: No headache, no dizziness, no motor weakness.  Skin: No rash Endocrine:  No polydipsia. No polyuria.  Psych: Denies SI/HI  No problems updated.  ALLERGIES: No Known Allergies  PAST MEDICAL HISTORY: Past Medical History:  Diagnosis Date  . Anemia   . Epilepsy ([place])   . Fibroids   . H/O bacterial infection   . H/O mumps   . Hyperlipidemia   . Hypertension   . Seizures ([place])     MEDICATIONS AT HOME: Prior to Admission medications   Medication Sig Start Date End Date Taking? Authorizing Provider  aspirin EC 81 MG EC tablet Take 1  tablet (81 mg total) by mouth daily. Patient not taking: Reported on 06/28/2018 04/08/18   Aline August, MD  atorvastatin (LIPITOR) 20 MG tablet Take 1 tablet (20 mg total) by mouth daily at 6 PM. 05/18/18   Charlott Rakes, MD  clopidogrel (PLAVIX) 75 MG tablet Take 1 tablet (75 mg total) by mouth daily. 05/18/18   Charlott Rakes, MD  famotidine (PEPCID) 10 MG tablet Take 1 tablet (10 mg total) by mouth 2 (two) times daily. 05/18/18   Charlott Rakes, MD  ferrous sulfate 325 (65 FE) MG tablet Take 1 tablet (325 mg total) by mouth 2 (two) times daily with a meal. 08/09/18   Jacqueli Pangallo, Dionne Bucy, PA-C  furosemide (LASIX) 20 MG tablet Take 1 tablet (20 mg total) by mouth daily. 05/18/18   Charlott Rakes, MD  gabapentin (NEURONTIN) 300 MG capsule Take 1 capsule (300 mg total) by mouth 2 (two) times daily. 05/18/18   Charlott Rakes, MD  metoprolol tartrate (LOPRESSOR) 50 MG tablet Take 1 tablet (50 mg total) by mouth 2 (two) times daily. 05/18/18   Charlott Rakes, MD     Objective:  EXAM:   Vitals:   08/09/18 1053  BP: 133/88  Pulse: 100  Resp: 16  Temp: 98.2 F (36.8 C)  TempSrc: Oral  SpO2: 100%  Weight: 192  lb 9.6 oz (87.4 kg)    General appearance : A&OX3. NAD. Non-toxic-appearing HEENT: Atraumatic and Normocephalic.  PERRLA. EOM intact.  Neck: supple, no JVD. No cervical lymphadenopathy. No thyromegaly Chest/Lungs:  Breathing-non-labored, Good air entry bilaterally, breath sounds normal without rales, rhonchi, or wheezing  CVS: S1 S2 regular, no murmurs, gallops, rubs  Abdomen: Bowel sounds present, Non tender and not distended with no gaurding, rigidity or rebound. Extremities: Bilateral Lower Ext shows no edema, both legs are warm to touch with = pulse throughout Neurology:  CN II-XII grossly intact, Non focal.   Psych:  TP linear. J/I WNL. Normal speech. Appropriate eye contact and affect.  Skin:  No Rash  Data Review Lab Results  Component Value Date   HGBA1C 5.9 (H)  04/05/2018     Assessment & Plan   1. Menorrhagia with irregular cycle With known fibroids.  No bleeding now. Drink 80-100 ounces of water daily.  Start taking iron twice daily.  Do not take any NSAIDS (ibuprofen, aleve, advil)or products containing aspirin.  Go to the [facility] if you start bleeding heavily again.   - Urine cytology ancillary only - POCT urine pregnancy - CBC with Differential/Platelet - TSH - FSH/LH - Ambulatory referral to Gynecology - Iron, TIBC and Ferritin Panel - ferrous sulfate 325 (65 FE) MG tablet; Take 1 tablet (325 mg total) by mouth 2 (two) times daily with a meal.  Dispense: 60 tablet; Refill: 3 - Urinalysis, Complete  2. Fibroids Drink 80-100 ounces of water daily.  Start taking iron twice daily.  Do not take any NSAIDS (ibuprofen, aleve, advil)or products containing aspirin.  Go to the [facility] if you start bleeding heavily again.   - Ambulatory referral to Gynecology  3. Essential hypertension Controlled-continue current regimen  4. Iron deficiency anemia due to chronic blood loss resume iron - CBC with Differential/Platelet - Iron, TIBC and Ferritin Panel - ferrous sulfate 325 (65 FE) MG tablet; Take 1 tablet (325 mg total) by mouth 2 (two) times daily with a meal.  Dispense: 60 tablet; Refill: 3  5. Anxiety Self-care and stress management techniques reviewed and I will have Christa See f/up with her and she will be seeing her PCP in a few weeks.   - Vitamin D, 25-hydroxy  Patient have been counseled extensively about nutrition and exercise  Return in about 3 weeks (around 08/30/2018) for Dr Margarita Rana for anxiety and recheck abnormal bleeding.  The patient was given clear instructions to go to ER or return to medical center if symptoms don't improve, worsen or new problems develop. The patient verbalized understanding. The patient was told to call to get lab results if they haven't heard anything in the next week.     Freeman Caldron,  PA-C [facility] [place], [place]   08/09/2018, 11:19 AM

## 2018-08-09 NOTE — Progress Notes (Signed)
Pt states when she is driving and she gets on the high way her right leg goes numb   Pt states she can't feel the gas paddle

## 2018-08-09 NOTE — Patient Instructions (Addendum)
Drink 80-100 ounces of water daily.  Start taking iron twice daily.  Do not take any NSAIDS (ibuprofen, aleve, advil)or products containing aspirin.  Go to the [facility] if you start bleeding heavily again.       Stress Stress is a normal reaction to life events. Stress is what you feel when life demands more than you are used to, or more than you think you can handle. Some stress can be useful, such as studying for a test or meeting a deadline at work. Stress that occurs too often or for too long can cause problems. It can affect your emotional health and interfere with relationships and normal daily activities. Too much stress can weaken your body's defense system (immune system) and increase your risk for physical illness. If you already have a medical problem, stress can make it worse. What are the causes? All sorts of life events can cause stress. An event that causes stress for one person may not be stressful for another person. Major life events, whether positive or negative, commonly cause stress. Examples include:  Losing a job or starting a new job.  Losing a loved one.  Moving to a new town or home.  Getting married or divorced.  Having a baby.  Injury or illness. Less obvious life events can also cause stress, especially if they occur day after day or in combination with each other. Examples include:  Working long hours.  Driving in traffic.  Caring for children.  Being in debt.  Being in a difficult relationship. What are the signs or symptoms? Stress can cause emotional symptoms, including:  Anxiety. This is feeling worried, afraid, on edge, overwhelmed, or out of control.  Anger, including irritation or impatience.  Depression. This is feeling sad, down, helpless, or guilty.  Trouble focusing, remembering, or making decisions. Stress can cause physical symptoms, including:  Aches and pains. These may affect your head, neck, back, stomach, or other areas of  your body.  Tight muscles or a clenched jaw.  Low energy.  Trouble sleeping. Stress can cause unhealthy behaviors, including:  Eating to feel better (overeating) or skipping meals.  Working too much or putting off tasks.  Smoking, drinking alcohol, or using drugs to feel better. How is this diagnosed? Stress is diagnosed through an assessment by your health care provider. He or she may diagnose this condition based on:  Your symptoms and any stressful life events.  Your medical history.  Tests to rule out other causes of your symptoms. Depending on your condition, your health care provider may refer you to a specialist for further evaluation. How is this treated?  Stress management techniques are the recommended treatment for stress. Medicine is not typically recommended for the treatment of stress. Techniques to reduce your reaction to stressful life events include:  Stress identification. Monitor yourself for symptoms of stress and identify what causes stress for you. These skills may help you to avoid or prepare for stressful events.  Time management. Set your priorities, keep a calendar of events, and learn to say "no." Taking these actions can help you avoid making too many commitments. Techniques for coping with stress include:  Rethinking the problem. Try to think realistically about stressful events rather than ignoring them or overreacting. Try to find the positives in a stressful situation rather than focusing on the negatives.  Exercise. Physical exercise can release both physical and emotional tension. The key is to find a form of exercise that you enjoy and do it  regularly.  Relaxation techniques. These relax the body and mind. The key is to find one or more that you enjoy and use the technique(s) regularly. Examples include: ? Meditation, deep breathing, or progressive relaxation techniques. ? Yoga or tai chi. ? Biofeedback, mindfulness techniques, or  journaling. ? Listening to music, being out in nature, or participating in other hobbies.  Practicing a healthy lifestyle. Eat a balanced diet, drink plenty of water, limit or avoid caffeine, and get plenty of sleep.  Having a strong support network. Spend time with family, friends, or other people you enjoy being around. Express your feelings and talk things over with someone you trust. Counseling or talk therapy with a mental health professional may be helpful if you are having trouble managing stress on your own. Follow these instructions at home: Lifestyle   Avoid drugs.  Do not use any products that contain nicotine or tobacco, such as cigarettes and e-cigarettes. If you need help quitting, ask your health care provider.  Limit alcohol intake to no more than 1 drink a day for nonpregnant women and 2 drinks a day for men. One drink equals 12 oz of beer, 5 oz of wine, or 1 oz of hard liquor.  Do not use alcohol or drugs to relax.  Eat a balanced diet that includes fresh fruits and vegetables, whole grains, lean meats, fish, eggs, and beans, and low-fat dairy. Avoid processed foods and foods high in added fat, sugar, and salt.  Exercise at least 30 minutes on 5 or more days each week.  Get 7-8 hours of sleep each night. General instructions   Practice stress management techniques as discussed with your health care provider.  Drink enough fluid to keep your urine clear or pale yellow.  Take over-the-counter and prescription medicines only as told by your health care provider.  Keep all follow-up visits as told by your health care provider. This is important. Contact a health care provider if:  Your symptoms get worse.  You have new symptoms.  You feel overwhelmed by your problems and can no longer manage them on your own. Get help right away if:  You have thoughts of hurting yourself or others. If you ever feel like you may hurt yourself or others, or have thoughts about  taking your own life, get help right away. You can go to your nearest emergency department or call:  Your local emergency services (911 in the [place]  A suicide crisis helpline, such as the [facility] at (854)575-6701. This is open 24 hours a day. Summary  Stress is a normal reaction to life events. It can cause problems if it happens too often or for too long.  Practicing stress management techniques is the best way to treat stress.  Counseling or talk therapy with a mental health professional may be helpful if you are having trouble managing stress on your own. This information is not intended to replace advice given to you by your health care provider. Make sure you discuss any questions you have with your health care provider. Document Released: 11/24/2000 Document Revised: 07/21/2016 Document Reviewed: 07/21/2016 Elsevier Interactive Patient Education  2019 [employer].

## 2018-08-10 ENCOUNTER — Other Ambulatory Visit: Payer: Self-pay | Admitting: Physician Assistant

## 2018-08-10 ENCOUNTER — Telehealth: Payer: Self-pay

## 2018-08-10 LAB — CBC WITH DIFFERENTIAL/PLATELET
BASOS: 1 %
Basophils Absolute: 0.1 10*3/uL (ref 0.0–0.2)
EOS (ABSOLUTE): 0.1 10*3/uL (ref 0.0–0.4)
Eos: 1 %
HEMATOCRIT: 25.7 % — AB (ref 34.0–46.6)
HEMOGLOBIN: 7.8 g/dL — AB (ref 11.1–15.9)
IMMATURE GRANS (ABS): 0 10*3/uL (ref 0.0–0.1)
Immature Granulocytes: 0 %
Lymphocytes Absolute: 1.1 10*3/uL (ref 0.7–3.1)
Lymphs: 28 %
MCH: 23.6 pg — ABNORMAL LOW (ref 26.6–33.0)
MCHC: 30.4 g/dL — ABNORMAL LOW (ref 31.5–35.7)
MCV: 78 fL — ABNORMAL LOW (ref 79–97)
Monocytes Absolute: 0.4 10*3/uL (ref 0.1–0.9)
Monocytes: 11 %
NEUTROS ABS: 2.4 10*3/uL (ref 1.4–7.0)
NEUTROS PCT: 59 %
Platelets: 380 10*3/uL (ref 150–450)
RBC: 3.31 x10E6/uL — ABNORMAL LOW (ref 3.77–5.28)
RDW: 18 % — ABNORMAL HIGH (ref 11.7–15.4)
WBC: 4.1 10*3/uL (ref 3.4–10.8)

## 2018-08-10 LAB — URINE CYTOLOGY ANCILLARY ONLY
Chlamydia: NEGATIVE
Neisseria Gonorrhea: NEGATIVE
Trichomonas: NEGATIVE

## 2018-08-10 LAB — MICROSCOPIC EXAMINATION
Bacteria, UA: NONE SEEN
CASTS: NONE SEEN /LPF

## 2018-08-10 LAB — FSH/LH
FSH: 27.5 m[IU]/mL
LH: 20.5 m[IU]/mL

## 2018-08-10 LAB — URINALYSIS, COMPLETE
Bilirubin, UA: NEGATIVE
Glucose, UA: NEGATIVE
Ketones, UA: NEGATIVE
Leukocytes, UA: NEGATIVE
NITRITE UA: NEGATIVE
Protein, UA: NEGATIVE
RBC, UA: NEGATIVE
Specific Gravity, UA: 1.026 (ref 1.005–1.030)
UUROB: 0.2 mg/dL (ref 0.2–1.0)
pH, UA: 6 (ref 5.0–7.5)

## 2018-08-10 LAB — VITAMIN D 25 HYDROXY (VIT D DEFICIENCY, FRACTURES): VIT D 25 HYDROXY: 8.6 ng/mL — AB (ref 30.0–100.0)

## 2018-08-10 LAB — IRON,TIBC AND FERRITIN PANEL
Ferritin: 4 ng/mL — ABNORMAL LOW (ref 15–150)
Iron Saturation: 3 % — CL (ref 15–55)
Iron: 12 ug/dL — ABNORMAL LOW (ref 27–159)
TIBC: 442 ug/dL (ref 250–450)
UIBC: 430 ug/dL — AB (ref 131–425)

## 2018-08-10 LAB — TSH: TSH: 0.735 u[IU]/mL (ref 0.450–4.500)

## 2018-08-10 MED ORDER — VITAMIN D (ERGOCALCIFEROL) 1.25 MG (50000 UNIT) PO CAPS: 50000.0000 [IU] | ORAL_CAPSULE | ORAL | 0 refills | Status: DC

## 2018-08-10 MED FILL — VIT D2 1.25 MG (50,000 UNIT: 1.25 MG | 84 days supply | Qty: 12 | Fill #0

## 2018-08-10 NOTE — Telephone Encounter (Signed)
CMA spoke to patient to inform on results and advising.  Pt. Is aware to pick up Rx.  Pt. Verified DOB. Pt. Understood.

## 2018-08-10 NOTE — Telephone Encounter (Signed)
-----   Message from Argentina Donovan, [place] sent at 08/10/2018  2:02 PM EST ----- Please call patient.  Her Iron is very low from the heavy bleeding.  She needs to take iron 3 times daily for 1 week and can drop down to twice daily after that.  Drink a lot of water as we discussed.  Her vitamin D is VERY low.  This can contribute to muscle aches, anxiety, fatigue, and depression.  I have sent a prescription to the pharmacy for her to take once a week.  We will recheck this level in 3-4 months.  Have her follow-up in 1 month as planned.  Tell her to go to the emergency room if she starts bleeding heavily again.  Thanks, Freeman Caldron, PA-C

## 2018-08-11 LAB — URINE CYTOLOGY ANCILLARY ONLY
Bacterial vaginitis: NEGATIVE
CANDIDA VAGINITIS: NEGATIVE

## 2018-08-17 ENCOUNTER — Ambulatory Visit: Payer: Self-pay | Admitting: Family Medicine

## 2018-08-17 ENCOUNTER — Telehealth: Payer: Self-pay

## 2018-08-17 NOTE — Telephone Encounter (Signed)
CMA attempt to reach patient to inform on results.  No answer and unable to leave a VM due to mailbox is full.

## 2018-08-17 NOTE — Telephone Encounter (Signed)
-----   Message from Argentina Donovan, [place] sent at 08/16/2018 12:34 PM EST ----- Please call patient.  Urine test was negative for STD, bacterial vaginosis, and yeast infection.  Follow-up as planned.  Thanks, Freeman Caldron, PA-C

## 2018-08-29 NOTE — Progress Notes (Deleted)
NEUROLOGY FOLLOW UP OFFICE NOTE  Tina Moss [id number]  HISTORY OF PRESENT ILLNESS: Tina Moss is a 48 year old right-handed African-American woman with hypertension, tobacco use disorder, migraines, anxiety and seizure disorder who follows up for stroke.  UPDATE: ***  HISTORY: She was admitted to [facility] on 04/04/2018 for acute stroke.  She presented with expressive aphasia and left sided facial and extremity numbness.  MRI of the brain was personally reviewed and revealed an acute left frontal MCA cortical infarct.  CT of the head and neck revealed no significant intracranial or extracranial arterial stenosis or occlusion.  TTE and TEE revealed normal ejection fraction with no cardiac source of emboli.  Telemetry revealed no arrhythmia.  LDL was 121.  Hemoglobin A1c was 5.9.  Urine drug screen, HIV and RPR were negative.  ANA, sed rate, CRP, and hypercoagulable panel were unremarkable.  She underwent a lumbar puncture for further evaluation of multiple sclerosis.  CSF analysis revealed cell count of 1, protein of 34, glucose of 68, no oligoclonal bands, and negative VDRL and cryptococcal antigen.  She was discharged on aspirin 81 mg and Plavix 75 mg daily as well as atorvastatin 20 mg daily.  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  She cannot remember the last time she had a seizure, but it may have been 5 years ago.  She is currently not on an antiepileptic medication and hasn't been for some time (maybe for 2 years).  She was previously on Dilantin and a medication that started with a "Z".  Her maternal grandmother had seizures.. EEG from 03/02/17 was normal.    PAST MEDICAL HISTORY: Past Medical History:  Diagnosis Date  . Anemia   . Epilepsy ([place])   . Fibroids   . H/O bacterial infection   . H/O mumps   . Hyperlipidemia   . Hypertension   . Seizures ([place])     MEDICATIONS: Current Outpatient Medications on File Prior to  Visit  Medication Sig Dispense Refill  . atorvastatin (LIPITOR) 20 MG tablet Take 1 tablet (20 mg total) by mouth daily at 6 PM. 30 tablet 6  . clopidogrel (PLAVIX) 75 MG tablet Take 1 tablet (75 mg total) by mouth daily. 30 tablet 6  . famotidine (PEPCID) 10 MG tablet Take 1 tablet (10 mg total) by mouth 2 (two) times daily. 60 tablet 6  . ferrous sulfate 325 (65 FE) MG tablet Take 1 tablet (325 mg total) by mouth 2 (two) times daily with a meal. 60 tablet 3  . furosemide (LASIX) 20 MG tablet Take 1 tablet (20 mg total) by mouth daily. 5 tablet 0  . gabapentin (NEURONTIN) 300 MG capsule Take 1 capsule (300 mg total) by mouth 2 (two) times daily. 60 capsule 3  . metoprolol tartrate (LOPRESSOR) 50 MG tablet Take 1 tablet (50 mg total) by mouth 2 (two) times daily. 60 tablet 6  . Vitamin D, Ergocalciferol, (DRISDOL) 1.25 MG (50000 UT) CAPS capsule Take 1 capsule (50,000 Units total) by mouth every 7 (seven) days. 16 capsule 0   No current facility-administered medications on file prior to visit.     ALLERGIES: No Known Allergies  FAMILY HISTORY: Family History  Problem Relation Age of Onset  . Hypertension Mother   . Hypertension Sister   . Arthritis Sister        knees  . Hypertension Brother   . Deafness Brother   . Speech disorder Brother  mute  . Mental illness Brother        "he can just fly off"  . Mental illness Daughter        ? bipolar  . Scoliosis Son    SOCIAL HISTORY: Social History   Socioeconomic History  . Marital status: Divorced    Spouse name: n/a  . Number of children: 2  . Years of education: 11th grade  . Highest education level: Not on file  Occupational History  . Occupation: Housekeeper    Comment: [employer]  Social Needs  . Financial resource strain: Not on file  . Food insecurity:    Worry: Not on file    Inability: Not on file  . Transportation needs:    Medical: Not on file    Non-medical: Not on file  Tobacco Use  . Smoking status:  Current Some Day Smoker    Types: Cigars  . Smokeless tobacco: Never Used  . Tobacco comment: "I think I just keep so much on my mind."  Substance and Sexual Activity  . Alcohol use: Yes    Alcohol/week: 0.0 - 3.0 standard drinks    Comment: social  . Drug use: Not Currently    Types: Marijuana    Comment: seldom  . Sexual activity: Not Currently    Partners: Male  Lifestyle  . Physical activity:    Days per week: Not on file    Minutes per session: Not on file  . Stress: Not on file  Relationships  . Social connections:    Talks on phone: Not on file    Gets together: Not on file    Attends religious service: Not on file    Active member of club or organization: Not on file    Attends meetings of clubs or organizations: Not on file    Relationship status: Not on file  . Intimate partner violence:    Fear of current or ex partner: Not on file    Emotionally abused: Not on file    Physically abused: Not on file    Forced sexual activity: Not on file  Other Topics Concern  . Not on file  Social History Narrative   Lives with her daughter.   Son is currently incarcerated in [place].   She completed 11th grade, but was kicked out and never when back, as she had become pregnant and was raising her daughter.   Divorced x 2.    REVIEW OF SYSTEMS: Constitutional: No fevers, chills, or sweats, no generalized fatigue, change in appetite Eyes: No visual changes, double vision, eye pain Ear, nose and throat: No hearing loss, ear pain, nasal congestion, sore throat Cardiovascular: No chest pain, palpitations Respiratory:  No shortness of breath at rest or with exertion, wheezes GastrointestinaI: No nausea, vomiting, diarrhea, abdominal pain, fecal incontinence Genitourinary:  No dysuria, urinary retention or frequency Musculoskeletal:  No neck pain, back pain Integumentary: No rash, pruritus, skin lesions Neurological: as above Psychiatric: No depression, insomnia, anxiety  Endocrine: No palpitations, fatigue, diaphoresis, mood swings, change in appetite, change in weight, increased thirst Hematologic/Lymphatic:  No purpura, petechiae. Allergic/Immunologic: no itchy/runny eyes, nasal congestion, recent allergic reactions, rashes  PHYSICAL EXAM: *** General: No acute distress.  Patient appears well-groomed.   Head:  Normocephalic/atraumatic Eyes:  Fundi examined but not visualized Neck: supple, no paraspinal tenderness, full range of motion Heart:  Regular rate and rhythm Lungs:  Clear to auscultation bilaterally Back: No paraspinal tenderness Neurological Exam: alert and oriented to person, place,  and time. Attention span and concentration intact, recent and remote memory intact, fund of knowledge intact.  Speech fluent and not dysarthric, language intact.  CN II-XII intact. Bulk and tone normal, muscle strength 5/5 throughout.  Sensation to light touch, temperature and vibration intact.  Deep tendon reflexes 2+ throughout, toes downgoing.  Finger to nose and heel to shin testing intact.  Gait normal, Romberg negative.  IMPRESSION: 1.  Left MCA cortical infarct, embolic of unknown etiology 2.  Hypertension 3.  Hyperlipidemia 4.  Tobacco use disorder 5.  Seizure disorder.  Currently not on antiepileptic therapy.  Last seizure was approximately 4 to 5 years ago.  PLAN: ***  Metta Clines, DO  CC: Charlott Rakes, MD

## 2018-08-30 ENCOUNTER — Encounter: Payer: Self-pay | Admitting: Family Medicine

## 2018-08-30 ENCOUNTER — Ambulatory Visit: Payer: Self-pay | Attending: Family Medicine | Admitting: Family Medicine

## 2018-08-30 ENCOUNTER — Other Ambulatory Visit: Payer: Self-pay

## 2018-08-30 VITALS — BP 143/88 | HR 98 | Temp 98.0°F | Ht 66.5 in | Wt 191.0 lb

## 2018-08-30 DIAGNOSIS — N939 Abnormal uterine and vaginal bleeding, unspecified: Secondary | ICD-10-CM

## 2018-08-30 DIAGNOSIS — D251 Intramural leiomyoma of uterus: Secondary | ICD-10-CM

## 2018-08-30 DIAGNOSIS — Z720 Tobacco use: Secondary | ICD-10-CM

## 2018-08-30 MED ORDER — MEDROXYPROGESTERONE ACETATE 10 MG PO TABS: 10.0000 mg | ORAL_TABLET | Freq: Every day | ORAL | 0 refills | Status: DC

## 2018-08-30 MED FILL — MEDROXYPROGESTERONE ACETATE: 10 | 10 days supply | Qty: 10 | Fill #0

## 2018-08-30 NOTE — Progress Notes (Signed)
Subjective:  Patient ID: [name], female    DOB: 03-Dec-1970  Age: 48 y.o. MRN: [id number]  CC: Menorrhagia and Anxiety   HPI AMAZIN PINCOCK  is a 48 year old female with a history of seizures (seizure - free for the last 5 years and is not currently on antiseizure medication), multiple sclerosis, L MCA CVA in 03/2017  who presents today for follow-up visit. She was seen last month by the PA for menorrhagia which she describes as passage of clots with heavy bleeding for the last 3 weeks and prior to that her periods have been regular with normal bleed.  She states she has had an episode of abnormal uterine bleed several years ago but never this severe.  Denies abdominal pain. She has lost her job as a result. Pelvic ultrasound from 12/2016 revealed at least 2 fibroids within the uterus.  Labs performed at her last visit revealed a hemoglobin of 7.8 for which she had been referred to the ED however she never went.  She is currently on ferrous sulfate. Referred to GYN at her last visit and appointment is pending.  Past Medical History:  Diagnosis Date  . Anemia   . Epilepsy ([place])   . Fibroids   . H/O bacterial infection   . H/O mumps   . Hyperlipidemia   . Hypertension   . Seizures ([place])     Past Surgical History:  Procedure Laterality Date  . CESAREAN SECTION     x2. at term  . TEE WITHOUT CARDIOVERSION N/A 04/06/2018   Procedure: TRANSESOPHAGEAL ECHOCARDIOGRAM (TEE) BUBBLE STUDY;  Surgeon: Lelon Perla, MD;  Location: [facility] ENDOSCOPY;  Service: Cardiovascular;  Laterality: N/A;  . TUBAL LIGATION    . WISDOM TOOTH EXTRACTION      Family History  Problem Relation Age of Onset  . Hypertension Mother   . Hypertension Sister   . Arthritis Sister        knees  . Hypertension Brother   . Deafness Brother   . Speech disorder Brother        mute  . Mental illness Brother        "he can just fly off"  . Mental illness Daughter        ? bipolar  . Scoliosis Son     No Known Allergies  Outpatient Medications Prior to Visit  Medication Sig Dispense Refill  . ferrous sulfate 325 (65 FE) MG tablet Take 1 tablet (325 mg total) by mouth 2 (two) times daily with a meal. 60 tablet 3  . Vitamin D, Ergocalciferol, (DRISDOL) 1.25 MG (50000 UT) CAPS capsule Take 1 capsule (50,000 Units total) by mouth every 7 (seven) days. 16 capsule 0  . atorvastatin (LIPITOR) 20 MG tablet Take 1 tablet (20 mg total) by mouth daily at 6 PM. (Patient not taking: Reported on 08/30/2018) 30 tablet 6  . clopidogrel (PLAVIX) 75 MG tablet Take 1 tablet (75 mg total) by mouth daily. (Patient not taking: Reported on 08/30/2018) 30 tablet 6  . famotidine (PEPCID) 10 MG tablet Take 1 tablet (10 mg total) by mouth 2 (two) times daily. (Patient not taking: Reported on 08/30/2018) 60 tablet 6  . furosemide (LASIX) 20 MG tablet Take 1 tablet (20 mg total) by mouth daily. (Patient not taking: Reported on 08/30/2018) 5 tablet 0  . gabapentin (NEURONTIN) 300 MG capsule Take 1 capsule (300 mg total) by mouth 2 (two) times daily. (Patient not taking: Reported on 08/30/2018) 60 capsule 3  .  metoprolol tartrate (LOPRESSOR) 50 MG tablet Take 1 tablet (50 mg total) by mouth 2 (two) times daily. (Patient not taking: Reported on 08/30/2018) 60 tablet 6   No facility-administered medications prior to visit.      ROS Review of Systems  Constitutional: Negative for activity change, appetite change and fatigue.  HENT: Negative for congestion, sinus pressure and sore throat.   Eyes: Negative for visual disturbance.  Respiratory: Negative for cough, chest tightness, shortness of breath and wheezing.   Cardiovascular: Negative for chest pain and palpitations.  Gastrointestinal: Negative for abdominal distention, abdominal pain and constipation.  Endocrine: Negative for polydipsia.  Genitourinary: Positive for menstrual problem. Negative for dysuria and frequency.  Musculoskeletal: Negative for arthralgias and back  pain.  Skin: Negative for rash.  Neurological: Negative for tremors, light-headedness and numbness.  Hematological: Does not bruise/bleed easily.  Psychiatric/Behavioral: Negative for agitation and behavioral problems.    Objective:  BP (!) 143/88   Pulse 98   Temp 98 F (36.7 C) (Oral)   Ht 5' 6.5" (1.689 m)   Wt 191 lb (86.6 kg)   SpO2 99%   BMI 30.37 kg/m   BP/Weight 08/30/2018 08/09/2018 5/39/7673  Systolic BP 419 379 024  Diastolic BP 88 88 78  Wt. (Lbs) 191 192.6 192  BMI 30.37 30.62 30.53      Physical Exam Constitutional:      Appearance: She is well-developed.  Cardiovascular:     Rate and Rhythm: Normal rate.     Heart sounds: Normal heart sounds. No murmur.  Pulmonary:     Effort: Pulmonary effort is normal.     Breath sounds: Normal breath sounds. No wheezing or rales.  Chest:     Chest wall: No tenderness.  Abdominal:     General: Bowel sounds are normal. There is no distension.     Palpations: Abdomen is soft. There is no mass.     Tenderness: There is no abdominal tenderness.  Musculoskeletal: Normal range of motion.  Neurological:     Mental Status: She is alert and oriented to person, place, and time.     CMP Latest Ref Rng & Units 04/10/2018 04/06/2018 04/04/2018  Glucose 70 - 99 mg/dL 109(H) 108(H) 84  BUN 6 - 20 mg/dL 10 12 10   Creatinine 0.44 - 1.00 mg/dL 0.61 0.62 0.40(L)  Sodium 135 - 145 mmol/L 137 139 144  Potassium 3.5 - 5.1 mmol/L 3.4(L) 3.6 4.1  Chloride 98 - 111 mmol/L 105 107 112(H)  CO2 22 - 32 mmol/L 26 26 -  Calcium 8.9 - 10.3 mg/dL 9.5 9.4 -  Total Protein 6.5 - 8.1 g/dL 7.4 - -  Total Bilirubin 0.3 - 1.2 mg/dL 0.5 - -  Alkaline Phos 38 - 126 U/L 89 - -  AST 15 - 41 U/L 23 - -  ALT 0 - 44 U/L 25 - -    Lipid Panel     Component Value Date/Time   CHOL 206 (H) 04/05/2018 0535   TRIG 63 04/05/2018 0535   HDL 72 04/05/2018 0535   CHOLHDL 2.9 04/05/2018 0535   VLDL 13 04/05/2018 0535   LDLCALC 121 (H) 04/05/2018 0535     CBC    Component Value Date/Time   WBC 4.1 08/09/2018 1203   WBC 3.4 (L) 04/10/2018 0550   RBC 3.31 (L) 08/09/2018 1203   RBC 4.75 04/10/2018 0550   HGB 7.8 (L) 08/09/2018 1203   HCT 25.7 (L) 08/09/2018 1203   PLT 380 08/09/2018 1203  MCV 78 (L) 08/09/2018 1203   MCH 23.6 (L) 08/09/2018 1203   MCH 21.3 (L) 04/10/2018 0550   MCHC 30.4 (L) 08/09/2018 1203   MCHC 29.3 (L) 04/10/2018 0550   RDW 18.0 (H) 08/09/2018 1203   LYMPHSABS 1.1 08/09/2018 1203   MONOABS 0.5 04/10/2018 0550   EOSABS 0.1 08/09/2018 1203   BASOSABS 0.1 08/09/2018 1203    Lab Results  Component Value Date   HGBA1C 5.9 (H) 04/05/2018    Assessment & Plan:   1. Abnormal uterine bleeding History of fibroids GYN appointment pending Place time Provera and advised on smoking cessation May need IUD versus ablation long-term - CBC with Differential/Platelet  2. Intramural leiomyoma of uterus See #1 above  3. Tobacco use Advised on smoking cessation especially in the light of need for medication for abnormal uterine bleeding.  Discussed increased thrombotic risk in patients greater than 35 to smoking who after being on OCP and she will be working on quitting   Meds ordered this encounter  Medications  . medroxyPROGESTERone (PROVERA) 10 MG tablet    Sig: Take 1 tablet (10 mg total) by mouth daily.    Dispense:  10 tablet    Refill:  0    Follow-up: Return in about 1 month (around 09/30/2018) for Follow-up of abnormal uterine bleed.       Charlott Rakes, MD, FAAFP. [facility] [place], [place]   08/30/2018, 3:29 PM

## 2018-08-30 NOTE — Patient Instructions (Signed)
Abnormal Uterine Bleeding  Abnormal uterine bleeding means bleeding more than usual from your uterus. It can include:   Bleeding between periods.   Bleeding after sex.   Bleeding that is heavier than normal.   Periods that last longer than usual.   Bleeding after you have stopped having your period (menopause).  There are many problems that may cause this. You should see a doctor for any kind of bleeding that is not normal. Treatment depends on the cause of the bleeding.  Follow these instructions at home:   Watch your condition for any changes.   Do not use tampons, douche, or have sex, if your doctor tells you not to.   Change your pads often.   Get regular well-woman exams. Make sure they include a pelvic exam and cervical cancer screening.   Keep all follow-up visits as told by your doctor. This is important.  Contact a doctor if:   The bleeding lasts more than one week.   You feel dizzy at times.   You feel like you are going to throw up (nauseous).   You throw up.  Get help right away if:   You pass out.   You have to change pads every hour.   You have belly (abdominal) pain.   You have a fever.   You get sweaty.   You get weak.   You passing large blood clots from your vagina.  Summary   Abnormal uterine bleeding means bleeding more than usual from your uterus.   There are many problems that may cause this. You should see a doctor for any kind of bleeding that is not normal.   Treatment depends on the cause of the bleeding.  This information is not intended to replace advice given to you by your health care provider. Make sure you discuss any questions you have with your health care provider.  Document Released: 03/28/2009 Document Revised: 05/25/2016 Document Reviewed: 05/25/2016  Elsevier Interactive Patient Education  2019 [employer]vier Inc[employer]

## 2018-08-31 ENCOUNTER — Ambulatory Visit: Payer: Self-pay | Admitting: Neurology

## 2018-08-31 LAB — CBC WITH DIFFERENTIAL/PLATELET
Basophils Absolute: 0.1 10*3/uL (ref 0.0–0.2)
Basos: 2 %
EOS (ABSOLUTE): 0.1 10*3/uL (ref 0.0–0.4)
EOS: 1 %
HEMATOCRIT: 35.6 % (ref 34.0–46.6)
Hemoglobin: 10.3 g/dL — ABNORMAL LOW (ref 11.1–15.9)
Immature Grans (Abs): 0 10*3/uL (ref 0.0–0.1)
Immature Granulocytes: 0 %
Lymphocytes Absolute: 1.2 10*3/uL (ref 0.7–3.1)
Lymphs: 28 %
MCH: 24.5 pg — ABNORMAL LOW (ref 26.6–33.0)
MCHC: 28.9 g/dL — AB (ref 31.5–35.7)
MCV: 85 fL (ref 79–97)
Monocytes Absolute: 0.6 10*3/uL (ref 0.1–0.9)
Monocytes: 12 %
Neutrophils Absolute: 2.5 10*3/uL (ref 1.4–7.0)
Neutrophils: 57 %
Platelets: 298 10*3/uL (ref 150–450)
RBC: 4.21 x10E6/uL (ref 3.77–5.28)
RDW: 18.5 % — AB (ref 11.7–15.4)
WBC: 4.5 10*3/uL (ref 3.4–10.8)

## 2018-09-01 ENCOUNTER — Telehealth: Payer: Self-pay

## 2018-09-01 NOTE — Telephone Encounter (Signed)
-----   Message from Charlott Rakes, MD sent at 08/31/2018  8:53 PM EDT ----- She is still anemic but this has improved. Continue Ferrous sulfate

## 2018-09-01 NOTE — Telephone Encounter (Signed)
Patient name and DOB has been verified Patient was informed of lab results. Patient had no questions.  

## 2018-09-26 ENCOUNTER — Telehealth: Payer: Self-pay | Admitting: General Practice

## 2018-09-26 ENCOUNTER — Ambulatory Visit: Payer: Self-pay | Admitting: Obstetrics & Gynecology

## 2018-09-26 ENCOUNTER — Other Ambulatory Visit: Payer: Self-pay

## 2018-09-26 DIAGNOSIS — N920 Excessive and frequent menstruation with regular cycle: Secondary | ICD-10-CM

## 2018-09-26 MED ORDER — MISOPROSTOL 200 MCG PO TABS: ORAL_TABLET | ORAL | 0 refills | Status: DC

## 2018-09-26 NOTE — Telephone Encounter (Signed)
0904- called patient for office visit appt, no answer- unable to leave message as voicemail is full.   0914- called patient for office visit appt, no answer- unable to leave message as voicemail is full.   0931- called patient for office visit appt, no answer- unable to leave message as voicemail is full. Patient's appt was at [phone].

## 2018-09-26 NOTE — Progress Notes (Signed)
   TELEHEALTH VIRTUAL GYNECOLOGY VISIT ENCOUNTER NOTE  [id number]

## 2018-10-02 ENCOUNTER — Ambulatory Visit: Payer: Self-pay | Admitting: Family Medicine

## 2018-12-20 ENCOUNTER — Other Ambulatory Visit: Payer: Self-pay

## 2018-12-20 ENCOUNTER — Encounter (INDEPENDENT_AMBULATORY_CARE_PROVIDER_SITE_OTHER): Payer: Self-pay

## 2018-12-20 ENCOUNTER — Ambulatory Visit: Payer: Self-pay | Attending: Family Medicine | Admitting: Emergency Medicine

## 2018-12-20 DIAGNOSIS — Z111 Encounter for screening for respiratory tuberculosis: Secondary | ICD-10-CM

## 2018-12-22 ENCOUNTER — Ambulatory Visit: Payer: Self-pay | Attending: Family Medicine | Admitting: Emergency Medicine

## 2018-12-22 ENCOUNTER — Other Ambulatory Visit: Payer: Self-pay

## 2018-12-22 DIAGNOSIS — Z111 Encounter for screening for respiratory tuberculosis: Secondary | ICD-10-CM

## 2018-12-22 LAB — TB SKIN TEST
Induration: 0 mm
TB Skin Test: NEGATIVE

## 2018-12-22 NOTE — Progress Notes (Signed)
Patient arrived ambulatory, alert and orientated to clinic.  Patient is in clinic for PPD reading  PPD reading negative, patient given letter stating such.  Patient advised that if she had any reactions to go to [facility] or ED.  Patient acknowledged understanding of advice.

## 2019-01-04 ENCOUNTER — Telehealth: Payer: Self-pay | Admitting: Family Medicine

## 2019-01-04 DIAGNOSIS — M79605 Pain in left leg: Secondary | ICD-10-CM

## 2019-01-04 DIAGNOSIS — M79604 Pain in right leg: Secondary | ICD-10-CM

## 2019-01-04 DIAGNOSIS — N921 Excessive and frequent menstruation with irregular cycle: Secondary | ICD-10-CM

## 2019-01-04 DIAGNOSIS — R600 Localized edema: Secondary | ICD-10-CM

## 2019-01-04 DIAGNOSIS — D500 Iron deficiency anemia secondary to blood loss (chronic): Secondary | ICD-10-CM

## 2019-01-04 DIAGNOSIS — I63512 Cerebral infarction due to unspecified occlusion or stenosis of left middle cerebral artery: Secondary | ICD-10-CM

## 2019-01-04 DIAGNOSIS — I10 Essential (primary) hypertension: Secondary | ICD-10-CM

## 2019-01-04 NOTE — Telephone Encounter (Signed)
Pt called stating intermitten breakthrough heavy bleeding. Pt request another prescription for the medication that stops the bleeding.  Pt also request refills of all other medication she takes as well.  Pt uses [facility].

## 2019-01-04 NOTE — Telephone Encounter (Signed)
Will route to PCP for review. 

## 2019-01-05 MED ORDER — CLOPIDOGREL BISULFATE 75 MG PO TABS: 75.0000 mg | ORAL_TABLET | Freq: Every day | ORAL | 6 refills | Status: DC

## 2019-01-05 MED ORDER — FAMOTIDINE 10 MG PO TABS: 10.0000 mg | ORAL_TABLET | Freq: Two times a day (BID) | ORAL | 6 refills | Status: DC

## 2019-01-05 MED ORDER — METOPROLOL TARTRATE 50 MG PO TABS: 50.0000 mg | ORAL_TABLET | Freq: Two times a day (BID) | ORAL | 6 refills | Status: DC

## 2019-01-05 MED ORDER — MEDROXYPROGESTERONE ACETATE 10 MG PO TABS: 10.0000 mg | ORAL_TABLET | Freq: Every day | ORAL | 0 refills | Status: DC

## 2019-01-05 MED ORDER — GABAPENTIN 300 MG PO CAPS: 300.0000 mg | ORAL_CAPSULE | Freq: Two times a day (BID) | ORAL | 3 refills | Status: DC

## 2019-01-05 MED ORDER — FERROUS SULFATE 325 (65 FE) MG PO TABS: 325.0000 mg | ORAL_TABLET | Freq: Two times a day (BID) | ORAL | 3 refills | Status: DC

## 2019-01-05 MED ORDER — ATORVASTATIN CALCIUM 20 MG PO TABS: 20.0000 mg | ORAL_TABLET | Freq: Every day | ORAL | 6 refills | Status: DC

## 2019-01-05 MED FILL — METOPROLOL TARTRATE 50 MG T: 50 | 30 days supply | Qty: 60 | Fill #0

## 2019-01-05 MED FILL — FERROUS SULFATE 325 MG TAB: 325 (65 FE) | 30 days supply | Qty: 60 | Fill #0

## 2019-01-05 MED FILL — MEDROXYPROGESTERONE ACETATE: 10 | 10 days supply | Qty: 10 | Fill #0

## 2019-01-05 MED FILL — CLOPIDOGREL 75 MG TABLET: 75 | 30 days supply | Qty: 30 | Fill #0

## 2019-01-05 MED FILL — GABAPENTIN 300 MG CAPSULE: 300 | 30 days supply | Qty: 60 | Fill #0

## 2019-01-05 MED FILL — ATORVASTATIN 20 MG TABLET: 20 | 30 days supply | Qty: 30 | Fill #0

## 2019-01-05 NOTE — Telephone Encounter (Signed)
Refilled

## 2019-01-05 NOTE — Telephone Encounter (Signed)
Patient was called and informed of refills.

## 2019-01-15 ENCOUNTER — Emergency Department (HOSPITAL_COMMUNITY)
Admission: EM | Admit: 2019-01-15 | Discharge: 2019-01-15 | Disposition: A | Discharge status: Home / Self Care | Payer: Self-pay | Attending: Emergency Medicine | Admitting: Emergency Medicine

## 2019-01-15 ENCOUNTER — Other Ambulatory Visit: Payer: Self-pay

## 2019-01-15 ENCOUNTER — Encounter (HOSPITAL_COMMUNITY): Payer: Self-pay | Admitting: Emergency Medicine

## 2019-01-15 DIAGNOSIS — N939 Abnormal uterine and vaginal bleeding, unspecified: Secondary | ICD-10-CM | POA: Insufficient documentation

## 2019-01-15 DIAGNOSIS — E785 Hyperlipidemia, unspecified: Secondary | ICD-10-CM | POA: Insufficient documentation

## 2019-01-15 DIAGNOSIS — Z79899 Other long term (current) drug therapy: Secondary | ICD-10-CM | POA: Insufficient documentation

## 2019-01-15 DIAGNOSIS — F17210 Nicotine dependence, cigarettes, uncomplicated: Secondary | ICD-10-CM | POA: Insufficient documentation

## 2019-01-15 DIAGNOSIS — I10 Essential (primary) hypertension: Secondary | ICD-10-CM | POA: Insufficient documentation

## 2019-01-15 LAB — CBC WITH DIFFERENTIAL/PLATELET
Abs Immature Granulocytes: 0.01 10*3/uL (ref 0.00–0.07)
Basophils Absolute: 0 10*3/uL (ref 0.0–0.1)
Basophils Relative: 1 %
Eosinophils Absolute: 0.1 10*3/uL (ref 0.0–0.5)
Eosinophils Relative: 3 %
HCT: 34.2 % — ABNORMAL LOW (ref 36.0–46.0)
Hemoglobin: 11.1 g/dL — ABNORMAL LOW (ref 12.0–15.0)
Immature Granulocytes: 0 %
Lymphocytes Relative: 26 %
Lymphs Abs: 1 10*3/uL (ref 0.7–4.0)
MCH: 29.7 pg (ref 26.0–34.0)
MCHC: 32.5 g/dL (ref 30.0–36.0)
MCV: 91.4 fL (ref 80.0–100.0)
Monocytes Absolute: 0.3 10*3/uL (ref 0.1–1.0)
Monocytes Relative: 9 %
Neutro Abs: 2.3 10*3/uL (ref 1.7–7.7)
Neutrophils Relative %: 61 %
Platelets: 210 10*3/uL (ref 150–400)
RBC: 3.74 MIL/uL — ABNORMAL LOW (ref 3.87–5.11)
RDW: 14.8 % (ref 11.5–15.5)
WBC: 3.8 10*3/uL — ABNORMAL LOW (ref 4.0–10.5)
nRBC: 0 % (ref 0.0–0.2)

## 2019-01-15 LAB — BASIC METABOLIC PANEL
Anion gap: 7 (ref 5–15)
BUN: 9 mg/dL (ref 6–20)
CO2: 24 mmol/L (ref 22–32)
Calcium: 9 mg/dL (ref 8.9–10.3)
Chloride: 109 mmol/L (ref 98–111)
Creatinine, Ser: 0.65 mg/dL (ref 0.44–1.00)
GFR calc Af Amer: >60 mL/min (ref >60)
GFR calc non Af Amer: >60 mL/min (ref >60)
Glucose, Bld: 109 mg/dL — ABNORMAL HIGH (ref 70–99)
Potassium: 3.6 mmol/L (ref 3.5–5.1)
Sodium: 140 mmol/L (ref 135–145)

## 2019-01-15 LAB — I-STAT BETA HCG BLOOD, ED (MC, WL, AP ONLY): I-stat hCG, quantitative: <5 m[IU]/mL (ref <5)

## 2019-01-15 NOTE — Discharge Instructions (Signed)
Please read the attached information.  [facility], please contact your primary care and let her know today's visit no relevant data.  If you develop any new or worsening signs or symptoms please return the emergency room for repeat evaluation.

## 2019-01-15 NOTE — ED Provider Notes (Addendum)
[facility] EMERGENCY DEPARTMENT Provider Note   CSN: [id number] Arrival date & time: 01/15/19  1008    History   Chief Complaint Chief Complaint  Patient presents with  . Vaginal Bleeding    HPI MONTI VILLERS is a 48 y.o. female.     HPI    48 year old female presents today with complaints of vaginal bleeding.  She notes approximate 10 days ago she started having heavy vaginal bleeding.  She notes this is typical when she has been diagnosed with uterine fibroids.  She notes that sometimes she has bleeding once a month sometimes twice per month.  She notes this was heavy with clots.  She notes that got better yesterday but then again started bleeding again today.  She reports that she called her primary care provider who called in an unknown medication to help stop the bleeding (Provera).  She has been taking this medication but has not had improvement in symptoms.  She denies any dizziness lightheadedness, chest pain.  She notes this is typical of previous episodes.  She has not seen an OB/GYN for this, but has had discussion about having a hysterectomy with her primary care provider.  She notes today she is having some pelvic cramping associated with this which is typical.  She notes she still has her menstrual cycles, is uncertain if this is her regular menstruation given the frequent bleeding.     Past Medical History:  Diagnosis Date  . Anemia   . Epilepsy ([place])   . Fibroids   . H/O bacterial infection   . H/O mumps   . Hyperlipidemia   . Hypertension   . Seizures [facility])     Patient Active Problem List   Diagnosis Date Noted  . Expressive aphasia   . 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 Acute CVA (cerebrovascular accident) ([place]) 04/04/2018  . Tobacco use 04/04/2018  . Heart palpitations 12/14/2016  . Intermittent chest pain 12/14/2016  . LVH (left ventricular hypertrophy) 12/14/2016  . Depression 11/15/2016  . Essential  hypertension 11/15/2016  . Fibroids 01/18/2012  . Menorrhagia 01/18/2012  . Seizure disorder ([place]) 01/18/2012  . Anemia 01/18/2012    Past Surgical History:  Procedure Laterality Date  . CESAREAN SECTION     x2. at term  . TEE WITHOUT CARDIOVERSION N/A 04/06/2018   Procedure: TRANSESOPHAGEAL ECHOCARDIOGRAM (TEE) BUBBLE STUDY;  Surgeon: Lelon Perla, MD;  Location: [facility] ENDOSCOPY;  Service: Cardiovascular;  Laterality: N/A;  . TUBAL LIGATION    . WISDOM TOOTH EXTRACTION       OB History    Gravida  3   Para  2   Term  2   Preterm      AB  1   Living  2     SAB  1   TAB      Ectopic      Multiple      Live Births  2        Obstetric Comments  Term c/s x 2.          Home Medications    Prior to Admission medications   Medication Sig Start Date End Date Taking? Authorizing Provider  atorvastatin (LIPITOR) 20 MG tablet Take 1 tablet (20 mg total) by mouth daily at 6 PM. 01/05/19   Charlott Rakes, MD  clopidogrel (PLAVIX) 75 MG tablet Take 1 tablet (75 mg total) by mouth daily. 01/05/19   Charlott Rakes, MD  famotidine (PEPCID) 10  MG tablet Take 1 tablet (10 mg total) by mouth 2 (two) times daily. 01/05/19   Charlott Rakes, MD  ferrous sulfate 325 (65 FE) MG tablet Take 1 tablet (325 mg total) by mouth 2 (two) times daily with a meal. 01/05/19   Charlott Rakes, MD  furosemide (LASIX) 20 MG tablet Take 1 tablet (20 mg total) by mouth daily. Patient not taking: Reported on 08/30/2018 05/18/18   Charlott Rakes, MD  gabapentin (NEURONTIN) 300 MG capsule Take 1 capsule (300 mg total) by mouth 2 (two) times daily. 01/05/19   Charlott Rakes, MD  medroxyPROGESTERone (PROVERA) 10 MG tablet Take 1 tablet (10 mg total) by mouth daily. 01/05/19   Charlott Rakes, MD  metoprolol tartrate (LOPRESSOR) 50 MG tablet Take 1 tablet (50 mg total) by mouth 2 (two) times daily. 01/05/19   Charlott Rakes, MD  Vitamin D, Ergocalciferol, (DRISDOL) 1.25 MG (50000 UT) CAPS capsule Take  1 capsule (50,000 Units total) by mouth every 7 (seven) days. 08/10/18   Argentina Donovan, PA-C    Family History Family History  Problem Relation Age of Onset  . Hypertension Mother   . Hypertension Sister   . Arthritis Sister        knees  . Hypertension Brother   . Deafness Brother   . Speech disorder Brother        mute  . Mental illness Brother        "he can just fly off"  . Mental illness Daughter        ? bipolar  . Scoliosis Son     Social History Social History   Tobacco Use  . Smoking status: Current Some Day Smoker    Types: Cigars  . Smokeless tobacco: Never Used  . Tobacco comment: "I think I just keep so much on my mind."  Substance Use Topics  . Alcohol use: Yes    Alcohol/week: 0.0 - 3.0 standard drinks    Comment: social  . Drug use: Not Currently    Types: Marijuana    Comment: seldom     Allergies   Patient has no known allergies.   Review of Systems Review of Systems  All other systems reviewed and are negative.    Physical Exam Updated Vital Signs BP (!) 142/96 (BP Location: Right Arm)   Pulse 83   Temp 98 F (36.7 C) (Oral)   Resp 18   LMP 01/15/2019   SpO2 99%   Physical Exam Vitals signs and nursing note reviewed.  Constitutional:      Appearance: She is well-developed.  HENT:     Head: Normocephalic and atraumatic.  Eyes:     General: No scleral icterus.       Right eye: No discharge.        Left eye: No discharge.     Conjunctiva/sclera: Conjunctivae normal.     Pupils: Pupils are equal, round, and reactive to light.  Neck:     Musculoskeletal: Normal range of motion.     Vascular: No JVD.     Trachea: No tracheal deviation.  Pulmonary:     Effort: Pulmonary effort is normal.     Breath sounds: No stridor.  Abdominal:     Comments: Very minimal tenderness to the suprapubic region  Genitourinary:    Comments: Cervical os is closed, no vaginal discharge very minimal vaginal bleeding, no clots in vaginal vault  Neurological:     Mental Status: She is alert and oriented to person, place,  and time.     Coordination: Coordination normal.  Psychiatric:        Behavior: Behavior normal.        Thought Content: Thought content normal.        Judgment: Judgment normal.      ED Treatments / Results  Labs (all labs ordered are listed, but only abnormal results are displayed) Labs Reviewed  CBC WITH DIFFERENTIAL/PLATELET - Abnormal; Notable for the following components:      Result Value   WBC 3.8 (*)    RBC 3.74 (*)    Hemoglobin 11.1 (*)    HCT 34.2 (*)    All other components within normal limits  BASIC METABOLIC PANEL - Abnormal; Notable for the following components:   Glucose, Bld 109 (*)    All other components within normal limits  I-STAT BETA HCG BLOOD, ED ([facility], [facility], [facility] ONLY)    EKG None  Radiology No results found.  Procedures Procedures (including critical care time)  Medications Ordered in ED Medications - No data to display   Initial Impression / Assessment and Plan / ED Course  I have reviewed the triage vital signs and the nursing notes.  Pertinent labs & imaging results that were available during my care of the patient were reviewed by me and considered in my medical decision making (see chart for details).        48 year old female presents today with vaginal bleeding.  He does have a history of fibroids, her bleeding is not new for her.  Patient's hemoglobin is reassuring compared to previous, she has no significant brisk bleed, no clots in the vaginal vault.  Patient has taken 2 doses of her Provera, I would encourage her to continue using this medication, I have encouraged her to talk to her primary care about her visit today.  She does have a follow-up appointment this week.  I referred her to the women's clinic as well.  If she develops any new or worsening signs or symptoms she will return immediately to the emergency room.  She is currently on Plavix given that  she has a reassuring hemoglobin compared to previous, and no significant brisk bleed I do find it reasonable to continue the Plavix in the setting of previous CVA.  Patient will discuss this with her primary care provider.  She verbalized understanding and agreement to today's plan had no further questions or concerns.  Final Clinical Impressions(s) / ED Diagnoses   Final diagnoses:  Vaginal bleeding    ED Discharge Orders    None       Francee Gentile 01/15/19 1558    Okey Regal, PA-C 01/15/19 [place], DO 01/16/19 [phone]

## 2019-01-15 NOTE — Telephone Encounter (Signed)
Pt called states at hospital now. They are trying to assist in stopping vaginal bleeding and also trying to refer to OBGYN per pt.  Pt plans to keep appt this Friday 01/19/19 with Fulp.  FYI.

## 2019-01-15 NOTE — ED Triage Notes (Signed)
Pt states on Thursday she began having heavy vaginal with mid abd pain. Pt states for the last year she has had irregular and infrequent periods.

## 2019-01-15 NOTE — Telephone Encounter (Signed)
noted 

## 2019-01-17 ENCOUNTER — Ambulatory Visit: Payer: Self-pay | Admitting: Obstetrics & Gynecology

## 2019-01-19 ENCOUNTER — Ambulatory Visit: Payer: Self-pay | Attending: Family Medicine | Admitting: Family Medicine

## 2019-01-19 ENCOUNTER — Encounter: Payer: Self-pay | Admitting: Family Medicine

## 2019-01-19 ENCOUNTER — Other Ambulatory Visit: Payer: Self-pay

## 2019-01-19 VITALS — BP 134/86 | HR 94 | Temp 98.0°F | Ht 66.5 in | Wt 188.6 lb

## 2019-01-19 DIAGNOSIS — D500 Iron deficiency anemia secondary to blood loss (chronic): Secondary | ICD-10-CM

## 2019-01-19 DIAGNOSIS — R1030 Lower abdominal pain, unspecified: Secondary | ICD-10-CM

## 2019-01-19 DIAGNOSIS — Z09 Encounter for follow-up examination after completed treatment for conditions other than malignant neoplasm: Secondary | ICD-10-CM

## 2019-01-19 DIAGNOSIS — R21 Rash and other nonspecific skin eruption: Secondary | ICD-10-CM

## 2019-01-19 DIAGNOSIS — N938 Other specified abnormal uterine and vaginal bleeding: Secondary | ICD-10-CM

## 2019-01-19 DIAGNOSIS — M25512 Pain in left shoulder: Secondary | ICD-10-CM

## 2019-01-19 LAB — POCT URINALYSIS DIP (CLINITEK)
Glucose, UA: NEGATIVE mg/dL
Leukocytes, UA: NEGATIVE
Nitrite, UA: NEGATIVE
Spec Grav, UA: 1.025
Urobilinogen, UA: 0.2 U/dL
pH, UA: 6

## 2019-01-19 MED ORDER — FERROUS SULFATE 325 (65 FE) MG PO TABS: 325.0000 mg | ORAL_TABLET | Freq: Two times a day (BID) | ORAL | 3 refills | Status: DC

## 2019-01-19 MED ORDER — TRAMADOL HCL 50 MG PO TABS: 50.0000 mg | ORAL_TABLET | Freq: Three times a day (TID) | ORAL | 0 refills | Status: AC | PRN

## 2019-01-19 MED ORDER — MEDROXYPROGESTERONE ACETATE 10 MG PO TABS: 10.0000 mg | ORAL_TABLET | Freq: Every day | ORAL | 0 refills | Status: DC

## 2019-01-19 MED ORDER — PREDNISONE 20 MG PO TABS: ORAL_TABLET | ORAL | 0 refills | Status: DC

## 2019-01-19 MED FILL — predniSONE 20 MG TABS: 20 | 9 days supply | Qty: 11 | Fill #0

## 2019-01-19 MED FILL — traMADol HCL 50 MG TABS: 50 | 5 days supply | Qty: 15 | Fill #0

## 2019-01-19 MED FILL — MEDROXYPROGESTERONE ACETATE: 10 | 10 days supply | Qty: 10 | Fill #0

## 2019-01-19 NOTE — Progress Notes (Signed)
Established Patient Office Visit  Subjective:  Patient ID: Tina Moss, female    DOB: [birth year]  Age: 48 y.o. MRN: [id number]  CC:  Chief Complaint  Patient presents with  . Shoulder Pain    HPI Tina Moss presents for evaluation of left shoulder pain but Tina Moss states that Tina Moss also recently went to the emergency department secondary to prolonged menses and has a history of anemia but also needs refill of iron.  Patient states that Tina Moss was referred to follow-up with GYN at the emergency department.  Tina Moss does continue to have some mild lower abdominal discomfort, dull aching sensation and occasional cramping.  Tina Moss was told that Tina Moss has uterine fibroids and Tina Moss is not sure if the abdominal pain is related to fibroids/prolonged menses.  Patient has been taking Provera to help with the menses which have slowed down somewhat but patient is not sure how long it will be until Tina Moss has appointment with GYN and Tina Moss would like a refill of the Provera.      Tina Moss reports that her shoulder pain has been going on for a few weeks.  Tina Moss now has difficulty holding things in her left hand as this causes increased pain and patient has trouble trying to pick up, lift or carry any objects in her left hand due to increased pain.  Tina Moss has increased pain when lying on her left shoulder at night and has to sleep on her right shoulder.  Tina Moss has pain that ranges between a 7 and 8 on a 0-to-10 scale constantly but pain is greater than a 10 if Tina Moss is lying on her left arm or if Tina Moss tries to reach up, reach for something that is behind her with her left arm and Tina Moss can no longer fully lift her left arm without significant pain therefore Tina Moss does not try to reach anything higher than shoulder level and Tina Moss has difficulty combing her hair and dressing secondary to her shoulder pain.  Past Medical History:  Diagnosis Date  . Anemia   . Epilepsy ([place])   . Fibroids   . H/O bacterial infection   . H/O mumps   .  Hyperlipidemia   . Hypertension   . Seizures ([place])     Past Surgical History:  Procedure Laterality Date  . CESAREAN SECTION     x2. at term  . TEE WITHOUT CARDIOVERSION N/A 04/06/2018   Procedure: TRANSESOPHAGEAL ECHOCARDIOGRAM (TEE) BUBBLE STUDY;  Surgeon: Lelon Perla, MD;  Location: [facility] ENDOSCOPY;  Service: Cardiovascular;  Laterality: N/A;  . TUBAL LIGATION    . WISDOM TOOTH EXTRACTION      Family History  Problem Relation Age of Onset  . Hypertension Mother   . Hypertension Sister   . Arthritis Sister        knees  . Hypertension Brother   . Deafness Brother   . Speech disorder Brother        mute  . Mental illness Brother        "he can just fly off"  . Mental illness Daughter        ? bipolar  . Scoliosis Son     Social History   Socioeconomic History  . Marital status: Divorced    Spouse name: n/a  . Number of children: 2  . Years of education: 11th grade  . Highest education level: Not on file  Occupational History  . Occupation: Housekeeper    Comment: [employer]  Social Needs  .  Financial resource strain: Not on file  . Food insecurity    Worry: Not on file    Inability: Not on file  . Transportation needs    Medical: Not on file    Non-medical: Not on file  Tobacco Use  . Smoking status: Current Some Day Smoker    Types: Cigars  . Smokeless tobacco: Never Used  . Tobacco comment: "I think I just keep so much on my mind."  Substance and Sexual Activity  . Alcohol use: Yes    Alcohol/week: 0.0 - 3.0 standard drinks    Comment: social  . Drug use: Not Currently    Types: Marijuana    Comment: seldom  . Sexual activity: Not Currently    Partners: Male  Lifestyle  . Physical activity    Days per week: Not on file    Minutes per session: Not on file  . Stress: Not on file  Relationships  . Social Herbalist on phone: Not on file    Gets together: Not on file    Attends religious service: Not on file    Active member of club  or organization: Not on file    Attends meetings of clubs or organizations: Not on file    Relationship status: Not on file  . Intimate partner violence    Fear of current or ex partner: Not on file    Emotionally abused: Not on file    Physically abused: Not on file    Forced sexual activity: Not on file  Other Topics Concern  . Not on file  Social History Narrative   Lives with her daughter.   Son is currently incarcerated in [place].   Tina Moss completed 11th grade, but was kicked out and never when back, as Tina Moss had become pregnant and was raising her daughter.   Divorced x 2.    Outpatient Medications Prior to Visit  Medication Sig Dispense Refill  . atorvastatin (LIPITOR) 20 MG tablet Take 1 tablet (20 mg total) by mouth daily at 6 PM. 30 tablet 6  . clopidogrel (PLAVIX) 75 MG tablet Take 1 tablet (75 mg total) by mouth daily. 30 tablet 6  . famotidine (PEPCID) 10 MG tablet Take 1 tablet (10 mg total) by mouth 2 (two) times daily. 60 tablet 6  . ferrous sulfate 325 (65 FE) MG tablet Take 1 tablet (325 mg total) by mouth 2 (two) times daily with a meal. 60 tablet 3  . furosemide (LASIX) 20 MG tablet Take 1 tablet (20 mg total) by mouth daily. (Patient not taking: Reported on 08/30/2018) 5 tablet 0  . gabapentin (NEURONTIN) 300 MG capsule Take 1 capsule (300 mg total) by mouth 2 (two) times daily. 60 capsule 3  . medroxyPROGESTERone (PROVERA) 10 MG tablet Take 1 tablet (10 mg total) by mouth daily. 10 tablet 0  . metoprolol tartrate (LOPRESSOR) 50 MG tablet Take 1 tablet (50 mg total) by mouth 2 (two) times daily. 60 tablet 6  . Vitamin D, Ergocalciferol, (DRISDOL) 1.25 MG (50000 UT) CAPS capsule Take 1 capsule (50,000 Units total) by mouth every 7 (seven) days. 16 capsule 0   No facility-administered medications prior to visit.     No Known Allergies  ROS Review of Systems  Constitutional: Positive for fatigue. Negative for chills and fever.  HENT: Negative for sore throat and trouble  swallowing.   Respiratory: Negative for cough and shortness of breath.   Cardiovascular: Negative for chest pain and palpitations.  Gastrointestinal: Positive for abdominal pain. Negative for constipation, diarrhea and nausea.  Endocrine: Negative for cold intolerance, heat intolerance, polydipsia, polyphagia and polyuria.  Genitourinary: Negative for dysuria and frequency.  Musculoskeletal: Positive for arthralgias. Negative for gait problem and joint swelling.  Neurological: Negative for dizziness and light-headedness.      Objective:    Physical Exam  Constitutional: Tina Moss is oriented to person, place, and time. Tina Moss appears well-developed and well-nourished.  Neck: Normal range of motion. Neck supple. No JVD present.  Cardiovascular: Normal rate and regular rhythm.  Pulmonary/Chest: Effort normal and breath sounds normal.  Abdominal: Soft. There is abdominal tenderness (Mild suprapubic discomfort to palpation). There is no rebound and no guarding.  Musculoskeletal:        General: Tenderness present. No edema.     Comments: Positive empty can sign at the left shoulder as well as discomfort to palpation at the anterior lateral and posterior lateral shoulder and decreased ability to lift the right arm above shoulder level.  Lymphadenopathy:    Tina Moss has no cervical adenopathy.  Neurological: Tina Moss is alert and oriented to person, place, and time.  Skin: Skin is warm and dry.  Hyperpigmented macules on the plantar surface of the feet  Psychiatric: Tina Moss has a normal mood and affect. Her behavior is normal.  Nursing note and vitals reviewed.   LMP 01/15/2019  Wt Readings from Last 3 Encounters:  08/30/18 191 lb (86.6 kg)  08/09/18 192 lb 9.6 oz (87.4 kg)  06/28/18 192 lb (87.1 kg)     Health Maintenance Due  Topic Date Due  . INFLUENZA VACCINE  01/13/2019    Lab Results  Component Value Date   TSH 0.735 08/09/2018   Lab Results  Component Value Date   WBC 3.8 (L) 01/15/2019     HGB 11.1 (L) 01/15/2019   HCT 34.2 (L) 01/15/2019   MCV 91.4 01/15/2019   PLT 210 01/15/2019   Lab Results  Component Value Date   NA 140 01/15/2019   K 3.6 01/15/2019   CO2 24 01/15/2019   GLUCOSE 109 (H) 01/15/2019   BUN 9 01/15/2019   CREATININE 0.65 01/15/2019   BILITOT 0.5 04/10/2018   ALKPHOS 89 04/10/2018   AST 23 04/10/2018   ALT 25 04/10/2018   PROT 7.4 04/10/2018   ALBUMIN 3.7 04/10/2018   CALCIUM 9.0 01/15/2019   ANIONGAP 7 01/15/2019   Lab Results  Component Value Date   CHOL 206 (H) 04/05/2018   Lab Results  Component Value Date   HDL 72 04/05/2018   Lab Results  Component Value Date   LDLCALC 121 (H) 04/05/2018   Lab Results  Component Value Date   TRIG 63 04/05/2018   Lab Results  Component Value Date   CHOLHDL 2.9 04/05/2018   Lab Results  Component Value Date   HGBA1C 5.9 (H) 04/05/2018      Assessment & Plan:  1. Acute pain of left shoulder Patient with complaint of acute pain of the left shoulder and patient provided with a prescription for tramadol to take as needed for pain over the next 5 days as Tina Moss should avoid the use of nonsteroidal anti-inflammatories as Tina Moss is already on Plavix therapy and has had issues with heavy menses.  Patient will be referred to orthopedics for further evaluation and treatment.  Patient given prednisone taper to help with pain/inflammation. - AMB referral to orthopedics - predniSONE (DELTASONE) 20 MG tablet; 3 pills on first day then 2 pills x 2 days,  1 pill x 2 days then 1/2 pill x 4 days; take after eating  Dispense: 11 tablet; Refill: 0 - traMADol (ULTRAM) 50 MG tablet; Take 1 tablet (50 mg total) by mouth every 8 (eight) hours as needed for up to 5 days.  Dispense: 15 tablet; Refill: 0  2. DUB (dysfunctional uterine bleeding) Patient reports that Tina Moss has been told in the past that Tina Moss has fibroids and patient has issues with heavy menses that are not always regular.  Patient states that Tina Moss can sometimes  have more than 1 episode of menses during the same month.  Patient was referred to GYN/women's clinic as part of discharge planning from recent emergency department visit.  Patient will have CBC in follow-up of her dysfunctional uterine bleeding/prolonged menses.  Patient provided with refill of iron replacement therapy as well as refill of Provera per patient's request as Tina Moss is not sure how long it will take to receive her follow-up appointment with GYN. - CBC with Differential - ferrous sulfate 325 (65 FE) MG tablet; Take 1 tablet (325 mg total) by mouth 2 (two) times daily with a meal.  Dispense: 60 tablet; Refill: 3 - medroxyPROGESTERone (PROVERA) 10 MG tablet; Take 1 tablet (10 mg total) by mouth daily.  Dispense: 10 tablet; Refill: 0  3. Lower abdominal pain Patient with suprapubic discomfort on exam.  Patient does have history of uterine fibroids and is having current issues with prolonged menses.  Patient will have CBC to look for elevated white blood cell count as well as in follow-up of her anemia.  Patient will also have urinalysis and urine culture as patient also had evidence of suprapubic discomfort on exam during her recent ED visit.  Culture is being sent as patient's UA may not be as accurate due to patient having some continued bleeding but states that this has improved with use of Provera. - CBC with Differential - POCT URINALYSIS DIP (CLINITEK) - Urine Culture  4. Encounter for examination following treatment at hospital Patient status post emergency department visit on 01/15/2019 due to continued vaginal bleeding which have been going on for about 10 days.  Patient with history of uterine fibroids.  Patient is also on Plavix and per her emergency department notes that Tina Moss only had mild anemia of 11.1, Tina Moss was told to continue the Plavix.  Patient will have repeat CBC done in follow-up to make sure that Tina Moss has had no additional decline in hemoglobin  5. Iron deficiency anemia due to  chronic blood loss Patient provided with refill of ferrous sulfate for treatment of iron deficiency anemia due to chronic blood loss.  Patient with CBC of 11.1 during emergency department visit on 01/15/2019. - ferrous sulfate 325 (65 FE) MG tablet; Take 1 tablet (325 mg total) by mouth 2 (two) times daily with a meal.  Dispense: 60 tablet; Refill: 3  6. Skin rash After visit had ended, patient reported to the [place] that Tina Moss forgot to mention a rash that Tina Moss noted on the bottom of her feet.  Patient with some hyperpigmented macules on the soles of feet bilaterally left greater than right.  Tina Moss states that the areas are not painful and do not itch.  Patient will have RPR to check for syphilis and HIV testing.  Patient is to follow-up with her PCP regarding the rash and Tina Moss will be notified of the results of today's lab work. - RPR - HIV antibody (with reflex)  An After Visit Summary was printed and given  to the patient.   Follow-up: Return in about 2 weeks (around 02/02/2019) for bleeding/pain-in 2 weeks with Dr. Margarita Rana.   Antony Blackbird, MD

## 2019-01-19 NOTE — Progress Notes (Signed)
Left shoulder pain.

## 2019-01-20 LAB — CBC WITH DIFFERENTIAL/PLATELET
Basophils Absolute: 0.1 x10E3/uL (ref 0.0–0.2)
Basos: 1 %
EOS (ABSOLUTE): 0.1 x10E3/uL (ref 0.0–0.4)
Eos: 1 %
Hematocrit: 32.1 % — ABNORMAL LOW (ref 34.0–46.6)
Hemoglobin: 10.7 g/dL — ABNORMAL LOW (ref 11.1–15.9)
Immature Grans (Abs): 0 x10E3/uL (ref 0.0–0.1)
Immature Granulocytes: 0 %
Lymphocytes Absolute: 1.5 x10E3/uL (ref 0.7–3.1)
Lymphs: 31 %
MCH: 30.4 pg (ref 26.6–33.0)
MCHC: 33.3 g/dL (ref 31.5–35.7)
MCV: 91 fL (ref 79–97)
Monocytes Absolute: 0.4 x10E3/uL (ref 0.1–0.9)
Monocytes: 9 %
Neutrophils Absolute: 2.7 x10E3/uL (ref 1.4–7.0)
Neutrophils: 58 %
Platelets: 288 x10E3/uL (ref 150–450)
RBC: 3.52 x10E6/uL — ABNORMAL LOW (ref 3.77–5.28)
RDW: 15.3 % (ref 11.7–15.4)
WBC: 4.7 x10E3/uL (ref 3.4–10.8)

## 2019-01-20 LAB — HIV ANTIBODY (ROUTINE TESTING W REFLEX): HIV Screen 4th Generation wRfx: NONREACTIVE

## 2019-01-20 LAB — SYPHILIS: RPR W/REFLEX TO RPR TITER AND TREPONEMAL ANTIBODIES, TRADITIONAL SCREENING AND DIAGNOSIS ALGORITHM: RPR Ser Ql: NONREACTIVE

## 2019-01-21 LAB — URINE CULTURE

## 2019-02-01 ENCOUNTER — Encounter: Payer: Self-pay | Admitting: *Deleted

## 2019-02-07 ENCOUNTER — Ambulatory Visit: Payer: Self-pay | Admitting: Family Medicine

## 2019-02-13 ENCOUNTER — Other Ambulatory Visit: Payer: Self-pay

## 2019-02-13 ENCOUNTER — Encounter: Payer: Self-pay | Admitting: Obstetrics and Gynecology

## 2019-02-13 ENCOUNTER — Ambulatory Visit (INDEPENDENT_AMBULATORY_CARE_PROVIDER_SITE_OTHER): Payer: Self-pay | Admitting: Obstetrics and Gynecology

## 2019-02-13 VITALS — BP 149/90 | HR 80 | Wt 186.0 lb

## 2019-02-13 DIAGNOSIS — N939 Abnormal uterine and vaginal bleeding, unspecified: Secondary | ICD-10-CM

## 2019-02-13 DIAGNOSIS — N938 Other specified abnormal uterine and vaginal bleeding: Secondary | ICD-10-CM

## 2019-02-13 MED ORDER — MEDROXYPROGESTERONE ACETATE 10 MG PO TABS: 10.0000 mg | ORAL_TABLET | Freq: Two times a day (BID) | ORAL | 6 refills | Status: DC

## 2019-02-13 MED FILL — MEDROXYPROGESTERONE ACETATE: 10 | 30 days supply | Qty: 60 | Fill #0

## 2019-02-13 NOTE — Progress Notes (Signed)
Mammogram scholarship faxed.

## 2019-02-13 NOTE — Progress Notes (Signed)
Obstetrics and Gynecology Established Patient Evaluation  Appointment Date: 02/13/2019  OBGYN Clinic: [facility]  Primary Care Provider: Charlott Moss  Referring Provider: Charlott Rakes, MD  Chief Complaint: AUB  History of Present Illness: Tina Moss is a 48 y.o. African-American [id number] (Patient's last menstrual period was 01/15/2019.), seen for the above chief complaint. Her past medical history is significant for  H/o CVA, h/o  seizure d/o, h/o c-section x 2, h/o BTL, HTN, h/o MS, h/o fibroids, h/o THC use.  She was last seen by me in July 2018 for a new patient evaluation for bleeding. She had a negative embx and pap smear but an u/s did show a 3cm submucosal fibroid. Options d/w her and she elected for medical management with depo provera.  She was most recently seen on 01/15/2019 in the ED for VB. Beta hcg and bmp neg with H/H stable at 11.1/34.2  She states she never started the depo provera. She was started on po provera in march of this year. She states prior to starting the po qday provera she has irregular period and had a heavy period that brought her to the ED last month.   No current VB, pain  +hot flashes, night sweats for past few years  Review of Systems: as noted in the History of Present Illness.  Patient Active Problem List   Diagnosis Date Noted  . Expressive aphasia   . 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 Acute CVA (cerebrovascular accident) ([place]) 04/04/2018  . Tobacco use 04/04/2018  . Heart palpitations 12/14/2016  . Intermittent chest pain 12/14/2016  . LVH (left ventricular hypertrophy) 12/14/2016  . Depression 11/15/2016  . Essential hypertension 11/15/2016  . Fibroids 01/18/2012  . Menorrhagia 01/18/2012  . Seizure disorder ([place]) 01/18/2012  . Anemia 01/18/2012    Past Medical History:  Past Medical History:  Diagnosis Date  . Anemia   . Epilepsy ([place])   . Fibroids   . H/O bacterial infection    . H/O mumps   . Hyperlipidemia   . Hypertension   . Seizures ([place])     Past Surgical History:  Past Surgical History:  Procedure Laterality Date  . CESAREAN SECTION     x2. at term  . TEE WITHOUT CARDIOVERSION N/A 04/06/2018   Procedure: TRANSESOPHAGEAL ECHOCARDIOGRAM (TEE) BUBBLE STUDY;  Surgeon: Lelon Perla, MD;  Location: [facility] ENDOSCOPY;  Service: Cardiovascular;  Laterality: N/A;  . TUBAL LIGATION    . WISDOM TOOTH EXTRACTION      Past Obstetrical History:  OB History  Gravida Para Term Preterm AB Living  3 2 2   1 2   SAB TAB Ectopic Multiple Live Births  1       2    # Outcome Date GA Lbr Len/2nd Weight Sex Delivery Anes PTL Lv  3 Term     U    LIV  2 Term     U    LIV  1 SAB     U    DEC    Obstetric Comments  Term c/s x 2.     Past Gynecological History: As per HPI.  Social History:  Social History   Socioeconomic History  . Marital status: Divorced    Spouse name: n/a  . Number of children: 2  . Years of education: 11th grade  . Highest education level: Not on file  Occupational History  . Occupation: Housekeeper    Comment: [employer]  Social Needs  .  Financial resource strain: Not on file  . Food insecurity    Worry: Not on file    Inability: Not on file  . Transportation needs    Medical: Not on file    Non-medical: Not on file  Tobacco Use  . Smoking status: Current Some Day Smoker    Types: Cigars  . Smokeless tobacco: Never Used  . Tobacco comment: "I think I just keep so much on my mind."  Substance and Sexual Activity  . Alcohol use: Yes    Alcohol/week: 0.0 - 3.0 standard drinks    Comment: social  . Drug use: Not Currently    Types: Marijuana    Comment: seldom  . Sexual activity: Not Currently    Partners: Male  Lifestyle  . Physical activity    Days per week: Not on file    Minutes per session: Not on file  . Stress: Not on file  Relationships  . Social Herbalist on phone: Not on file    Gets together: Not  on file    Attends religious service: Not on file    Active member of club or organization: Not on file    Attends meetings of clubs or organizations: Not on file    Relationship status: Not on file  . Intimate partner violence    Fear of current or ex partner: Not on file    Emotionally abused: Not on file    Physically abused: Not on file    Forced sexual activity: Not on file  Other Topics Concern  . Not on file  Social History Narrative   Lives with her daughter.   Son is currently incarcerated in [place].   She completed 11th grade, but was kicked out and never when back, as she had become pregnant and was raising her daughter.   Divorced x 2.    Family History:  Family History  Problem Relation Age of Onset  . Hypertension Mother   . Hypertension Sister   . Arthritis Sister        knees  . Hypertension Brother   . Deafness Brother   . Speech disorder Brother        mute  . Mental illness Brother        "he can just fly off"  . Mental illness Daughter        ? bipolar  . Scoliosis Son     Medications Tina Moss had no medications administered during this visit. Current Outpatient Medications  Medication Sig Dispense Refill  . atorvastatin (LIPITOR) 20 MG tablet Take 1 tablet (20 mg total) by mouth daily at 6 PM. 30 tablet 6  . clopidogrel (PLAVIX) 75 MG tablet Take 1 tablet (75 mg total) by mouth daily. 30 tablet 6  . ferrous sulfate 325 (65 FE) MG tablet Take 1 tablet (325 mg total) by mouth 2 (two) times daily with a meal. 60 tablet 3  . gabapentin (NEURONTIN) 300 MG capsule Take 1 capsule (300 mg total) by mouth 2 (two) times daily. 60 capsule 3  . medroxyPROGESTERone (PROVERA) 10 MG tablet Take 1 tablet (10 mg total) by mouth daily. 10 tablet 0  . metoprolol tartrate (LOPRESSOR) 50 MG tablet Take 1 tablet (50 mg total) by mouth 2 (two) times daily. 60 tablet 6  . predniSONE (DELTASONE) 20 MG tablet 3 pills on first day then 2 pills x 2 days, 1 pill x 2 days  then 1/2 pill x 4  days; take after eating 11 tablet 0  . Vitamin D, Ergocalciferol, (DRISDOL) 1.25 MG (50000 UT) CAPS capsule Take 1 capsule (50,000 Units total) by mouth every 7 (seven) days. 16 capsule 0  . famotidine (PEPCID) 10 MG tablet Take 1 tablet (10 mg total) by mouth 2 (two) times daily. (Patient not taking: Reported on 02/13/2019) 60 tablet 6  . furosemide (LASIX) 20 MG tablet Take 1 tablet (20 mg total) by mouth daily. (Patient not taking: Reported on 08/30/2018) 5 tablet 0   No current facility-administered medications for this visit.     Allergies Patient has no known allergies.   Physical Exam:  BP (!) 149/90   Pulse 80   Wt 186 lb (84.4 kg)   LMP 01/15/2019   BMI 29.57 kg/m  Body mass index is 29.57 kg/m.  General appearance: Well nourished, well developed female in no acute distress.  Neuro/Psych:  Normal mood and affect.  Skin:  Warm and dry.   Laboratory:  CBC Latest Ref Rng & Units 01/19/2019 01/15/2019 08/30/2018  WBC 3.4 - 10.8 x10E3/uL 4.7 3.8(L) 4.5  Hemoglobin 11.1 - 15.9 g/dL 10.7(L) 11.1(L) 10.3(L)  Hematocrit 34.0 - 46.6 % 32.1(L) 34.2(L) 35.6  Platelets 150 - 450 x10E3/uL 288 210 298   Radiology:  CLINICAL DATA:  Initial evaluation for menorrhagia.  EXAM: TRANSABDOMINAL AND TRANSVAGINAL ULTRASOUND OF PELVIS  TECHNIQUE: Both transabdominal and transvaginal ultrasound examinations of the pelvis were performed. Transabdominal technique was performed for global imaging of the pelvis including uterus, ovaries, adnexal regions, and pelvic cul-de-sac. It was necessary to proceed with endovaginal exam following the transabdominal exam to visualize the uterus and ovaries.  COMPARISON:  Prior ultrasound from 09/25/2013.  FINDINGS: Uterus  Measurements: 9.6 x 5.5 x 6.8 cm. Submucosal fibroid measures 4.4 x 3.6 x 3.8 cm. Additional intramural fibroid at the right uterine body measures 2.3 x 1.8 x 2.0 cm  Endometrium  Not well assessed due to  shadowing from the overlying submucosal fibroid. Small amount of probable trapped fluid noted within the endometrial canal.  Right ovary  Measurements: 2.2 x 1.4 x 2.3 cm. Normal appearance/no adnexal mass.  Left ovary  Measurements: 2.7 x 1.6 x 1.9 cm. Normal appearance/no adnexal mass.  Other findings  No abnormal free fluid.  IMPRESSION: 1. At least 2 fibroids within the uterus, 1 of which is submucosal in location and obscures the endometrial stripe which is not well evaluated on this exam. Small amount of associated trapped fluid within the endocervical canal. Further evaluation with pelvic MRI could be considered to evaluate the endometrial stripe as clinically indicated. 2. Normal sonographic appearance of the ovaries.   Electronically Signed   By: Jeannine Boga M.D.   On: 12/30/2016 20:41  Assessment: pt stable  Plan:  1. Abnormal uterine bleeding (AUB) Patient currently doesn't have insurance and is applying for various programs. I told her that in the interims I recommend increasing her po provera to bid and hopefully that will keep her from bleeding.   If able to get insurance coverage, then can consider vaginal myomectomy and/or endometrial ablation, if bid provera not working well. Also could consider 6-27m course of depot lupron with add back given her age and menopausal s/s.   RTC 77m follow up  Durene Romans MD Attending Center for [facility])

## 2019-02-13 NOTE — Addendum Note (Signed)
Addended by: Michel Harrow on: 02/13/2019 03:01 PM   Modules accepted: Orders

## 2019-02-14 ENCOUNTER — Other Ambulatory Visit: Payer: Self-pay | Admitting: Family Medicine

## 2019-02-14 DIAGNOSIS — M25512 Pain in left shoulder: Secondary | ICD-10-CM

## 2019-02-20 ENCOUNTER — Encounter: Payer: Self-pay | Admitting: *Deleted

## 2019-02-28 ENCOUNTER — Ambulatory Visit: Payer: Self-pay | Attending: Family Medicine | Admitting: Family Medicine

## 2019-02-28 ENCOUNTER — Other Ambulatory Visit: Payer: Self-pay

## 2019-02-28 ENCOUNTER — Encounter: Payer: Self-pay | Admitting: Family Medicine

## 2019-02-28 DIAGNOSIS — G8929 Other chronic pain: Secondary | ICD-10-CM

## 2019-02-28 DIAGNOSIS — M25561 Pain in right knee: Secondary | ICD-10-CM

## 2019-02-28 DIAGNOSIS — N938 Other specified abnormal uterine and vaginal bleeding: Secondary | ICD-10-CM

## 2019-02-28 DIAGNOSIS — M25512 Pain in left shoulder: Secondary | ICD-10-CM

## 2019-02-28 MED ORDER — TRAMADOL HCL 50 MG PO TABS: 50.0000 mg | ORAL_TABLET | Freq: Two times a day (BID) | ORAL | 1 refills | Status: AC | PRN

## 2019-02-28 MED FILL — traMADol HCL 50 MG TABS: 50 | 20 days supply | Qty: 40 | Fill #0

## 2019-02-28 NOTE — Progress Notes (Signed)
Virtual Visit via Telephone Note  I connected with Tina Moss, on 02/28/2019 at 1:37 PM by telephone due to the COVID-19 pandemic and verified that I am speaking with the correct person using two identifiers.   Consent: I discussed the limitations, risks, security and privacy concerns of performing an evaluation and management service by telephone and the availability of in person appointments. I also discussed with the patient that there may be a patient responsible charge related to this service. The patient expressed understanding and agreed to proceed.   Location of Patient: Home  Location of Provider: Clinic   Persons participating in Telemedicine visit: Haddy K Lonia Blood [place]-CMA Dr. Felecia Shelling     History of Present Illness:  Tina Moss is a 48 year old female with a history of seizures (seizure - free for the last 5 years and is not currently on antiseizure medication), multiple sclerosis, L MCA CVA in 03/2017  who presents today for follow-up visit.   At her last visit she was seen for abnormal uterine bleed. Her abnormal uterine bleed has resolved; she is on Provera after a visit with GYN.  Complains of left shoulder pain for which she saw Dr Chapman Fitch last month. Describes it as unbearable, worse at night, worse when she tries to take off her clothes and it is more comfortable holding her arm in a flexed and in internal rotation. She also complains of right knee pain and is s/p arthrocentesis in the past; complains of having knots on her right knee with associated edema. Pain is 10/10 at night; ascending stairs aggraviates the pain. Uses THC for recreation; tramadol which she received from her last visit did provide some relief.  Past Medical History:  Diagnosis Date  . Anemia   . Epilepsy ([place])   . Fibroids   . H/O bacterial infection   . H/O mumps   . Hyperlipidemia   . Hypertension   . Seizures ([place])    No Known Allergies  Current Outpatient  Medications on File Prior to Visit  Medication Sig Dispense Refill  . atorvastatin (LIPITOR) 20 MG tablet Take 1 tablet (20 mg total) by mouth daily at 6 PM. 30 tablet 6  . clopidogrel (PLAVIX) 75 MG tablet Take 1 tablet (75 mg total) by mouth daily. 30 tablet 6  . ferrous sulfate 325 (65 FE) MG tablet Take 1 tablet (325 mg total) by mouth 2 (two) times daily with a meal. 60 tablet 3  . gabapentin (NEURONTIN) 300 MG capsule Take 1 capsule (300 mg total) by mouth 2 (two) times daily. 60 capsule 3  . medroxyPROGESTERone (PROVERA) 10 MG tablet Take 1 tablet (10 mg total) by mouth 2 (two) times daily. 60 tablet 6  . metoprolol tartrate (LOPRESSOR) 50 MG tablet Take 1 tablet (50 mg total) by mouth 2 (two) times daily. 60 tablet 6  . famotidine (PEPCID) 10 MG tablet Take 1 tablet (10 mg total) by mouth 2 (two) times daily. (Patient not taking: Reported on 02/13/2019) 60 tablet 6  . furosemide (LASIX) 20 MG tablet Take 1 tablet (20 mg total) by mouth daily. (Patient not taking: Reported on 08/30/2018) 5 tablet 0  . predniSONE (DELTASONE) 20 MG tablet 3 pills on first day then 2 pills x 2 days, 1 pill x 2 days then 1/2 pill x 4 days; take after eating (Patient not taking: Reported on 02/28/2019) 11 tablet 0  . Vitamin D, Ergocalciferol, (DRISDOL) 1.25 MG (50000 UT) CAPS capsule Take 1 capsule (50,000 Units total)  by mouth every 7 (seven) days. (Patient not taking: Reported on 02/28/2019) 16 capsule 0   No current facility-administered medications on file prior to visit.     Observations/Objective: Awake, alert, oriented x3 Not in acute distress  Assessment and Plan: 1. Chronic pain of right knee Unable to use NSAIDs due to increased risk of bleeding with Plavix Placed on tramadol Advised to quit cannabis use - Ambulatory referral to Physical Therapy - traMADol (ULTRAM) 50 MG tablet; Take 1 tablet (50 mg total) by mouth every 12 (twelve) hours as needed for up to 5 days.  Dispense: 40 tablet; Refill:  1  2. Acute pain of left shoulder See #1 above - Ambulatory referral to Physical Therapy - traMADol (ULTRAM) 50 MG tablet; Take 1 tablet (50 mg total) by mouth every 12 (twelve) hours as needed for up to 5 days.  Dispense: 40 tablet; Refill: 1  3. DUB (dysfunctional uterine bleeding) Controlled on Provera   Follow Up Instructions: Return in about 6 weeks (around 04/11/2019) for shoulder pain and knee pain - in person.    I discussed the assessment and treatment plan with the patient. The patient was provided an opportunity to ask questions and all were answered. The patient agreed with the plan and demonstrated an understanding of the instructions.   The patient was advised to call back or seek an in-person evaluation if the symptoms worsen or if the condition fails to improve as anticipated.     I provided 16 minutes total of non-face-to-face time during this encounter including median intraservice time, reviewing previous notes, labs, imaging, medications, management and patient verbalized understanding.     Charlott Rakes, MD, FAAFP. [facility] [place], [place]   02/28/2019, 1:37 PM

## 2019-02-28 NOTE — Progress Notes (Signed)
Patient has been called and DOB has been verified. Patient has been screened and transferred to PCP to start phone visit.  Patient is having pain in shoulders.  Patient states that her legs are swelling.

## 2019-03-21 ENCOUNTER — Other Ambulatory Visit: Payer: Self-pay | Admitting: Family Medicine

## 2019-03-21 DIAGNOSIS — R600 Localized edema: Secondary | ICD-10-CM

## 2019-03-21 MED FILL — MEDROXYPROGESTERONE ACETATE: 10 | 30 days supply | Qty: 60 | Fill #1

## 2019-03-21 MED FILL — VIT D2 1.25 MG (50,000 UNIT: 1.25 MG | 27 days supply | Qty: 4 | Fill #1

## 2019-03-21 MED FILL — FERROUS SULFATE 325 MG TAB: 325 (65 FE) | 30 days supply | Qty: 60 | Fill #1

## 2019-03-21 MED FILL — ATORVASTATIN CALCIUM 20 MG: 20 | 30 days supply | Qty: 30 | Fill #1

## 2019-03-21 MED FILL — GABAPENTIN 300 MG CAPSULE: 300 | 30 days supply | Qty: 60 | Fill #1

## 2019-03-21 MED FILL — METOPROLOL TARTRATE 50 MG T: 50 | 30 days supply | Qty: 60 | Fill #1

## 2019-03-23 ENCOUNTER — Telehealth: Payer: Self-pay | Admitting: Family Medicine

## 2019-03-23 MED ORDER — DICLOFENAC SODIUM 1 % TD GEL: 4.0000 g | Freq: Four times a day (QID) | TRANSDERMAL | 1 refills | Status: DC

## 2019-03-23 MED FILL — DICLOFENAC SODIUM 1% GEL: 1 | 6 days supply | Qty: 100 | Fill #0

## 2019-03-23 NOTE — Telephone Encounter (Signed)
Patient called stating that the pain medication that was given isnt strong enough and she would like something new.

## 2019-03-23 NOTE — Telephone Encounter (Signed)
I referred her to physical therapy and her chart indicates on 03/06/2019 physical therapy had called her to schedule an appointment but was unable to reach her.  Can you please provide her with the number to reschedule this appointment?  I have sent a prescription for Voltaren gel which she can use in addition to tramadol to her pharmacy.  Thank you.

## 2019-04-06 ENCOUNTER — Other Ambulatory Visit: Payer: Self-pay

## 2019-04-06 ENCOUNTER — Encounter: Payer: Self-pay | Admitting: Physical Therapy

## 2019-04-06 ENCOUNTER — Ambulatory Visit: Payer: Self-pay | Attending: Family Medicine | Admitting: Physical Therapy

## 2019-04-06 DIAGNOSIS — M25512 Pain in left shoulder: Secondary | ICD-10-CM | POA: Insufficient documentation

## 2019-04-06 DIAGNOSIS — R293 Abnormal posture: Secondary | ICD-10-CM

## 2019-04-06 DIAGNOSIS — G8929 Other chronic pain: Secondary | ICD-10-CM

## 2019-04-06 DIAGNOSIS — M25561 Pain in right knee: Secondary | ICD-10-CM | POA: Insufficient documentation

## 2019-04-06 DIAGNOSIS — M6281 Muscle weakness (generalized): Secondary | ICD-10-CM
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
## 2019-04-09 ENCOUNTER — Encounter: Payer: Self-pay | Admitting: Physical Therapy

## 2019-04-11 ENCOUNTER — Ambulatory Visit: Payer: Self-pay | Admitting: Family Medicine

## 2019-04-17 ENCOUNTER — Other Ambulatory Visit: Payer: Self-pay

## 2019-04-17 ENCOUNTER — Ambulatory Visit: Payer: Self-pay | Attending: Family Medicine | Admitting: Physical Therapy

## 2019-04-17 DIAGNOSIS — M25512 Pain in left shoulder: Secondary | ICD-10-CM | POA: Insufficient documentation

## 2019-04-17 DIAGNOSIS — M6281 Muscle weakness (generalized): Secondary | ICD-10-CM | POA: Insufficient documentation

## 2019-04-17 DIAGNOSIS — M25561 Pain in right knee: Secondary | ICD-10-CM | POA: Insufficient documentation

## 2019-04-17 DIAGNOSIS — G8929 Other chronic pain: Secondary | ICD-10-CM | POA: Insufficient documentation

## 2019-04-17 DIAGNOSIS — I63512 Cerebral infarction due to unspecified occlusion or stenosis of left middle cerebral artery: Secondary | ICD-10-CM | POA: Insufficient documentation

## 2019-04-17 DIAGNOSIS — R293 Abnormal posture: Secondary | ICD-10-CM | POA: Insufficient documentation

## 2019-04-17 NOTE — Therapy (Addendum)
[place], [place], [address] Phone: [phone]   Fax:  [phone]  Physical Therapy Treatment  Patient Details  Name: Tina Moss MRN: [id number] Date of Birth: 1970-12-08 Referring Provider (PT): Charlott Rakes, MD   Encounter Date: 04/17/2019  PT End of Session - 04/17/19 0836    Visit Number  2    Date for PT Re-Evaluation  06/15/19    Authorization Type  Self pay, trying to get CAFA    PT Start Time  0830    PT Stop Time  0915    PT Time Calculation (min)  45 min    Activity Tolerance  Patient tolerated treatment well    Behavior During Therapy  [facility] for tasks assessed/performed       Past Medical History:  Diagnosis Date  . Anemia   . Epilepsy ([place])   . Fibroids   . H/O bacterial infection   . H/O mumps   . Hyperlipidemia   . Hypertension   . Seizures ([place])     Past Surgical History:  Procedure Laterality Date  . CESAREAN SECTION     x2. at term  . TEE WITHOUT CARDIOVERSION N/A 04/06/2018   Procedure: TRANSESOPHAGEAL ECHOCARDIOGRAM (TEE) BUBBLE STUDY;  Surgeon: Lelon Perla, MD;  Location: [facility] ENDOSCOPY;  Service: Cardiovascular;  Laterality: N/A;  . TUBAL LIGATION    . WISDOM TOOTH EXTRACTION      There were no vitals filed for this visit.  Subjective Assessment - 04/17/19 0831    Subjective  Pt arriving to therapy reporting 7/10 pain in R knee and 5/10 pain in left shoulder. Pt reporting pain at times that makes her want to cry. Pt reporting pain radiating from knee to hip and low back.    How long can you walk comfortably?  no time comfortably    Patient Stated Goals  get better    Currently in Pain?  Yes    Pain Score  7     Pain Location  Knee    Pain Orientation  Right    Pain Descriptors / Indicators  Radiating;Tingling    Pain Type  Chronic pain    Pain Onset  More than a month ago    Pain Frequency  Constant    Aggravating Factors   walking, standing    Pain  Relieving Factors  sitting    Multiple Pain Sites  Yes    Pain Score  5    Pain Location  Shoulder    Pain Orientation  Left    Pain Descriptors / Indicators  Aching;Sore    Pain Type  Chronic pain    Pain Onset  More than a month ago    Pain Frequency  Constant    Aggravating Factors   reaching backward or lifting    Pain Relieving Factors  resting         [facility] PT Assessment - 04/17/19 0001      Assessment   Medical Diagnosis  Rt Knee, Lt shoulder pain    Referring Provider (PT)  Charlott Rakes, MD    Hand Dominance  Right      Precautions   Precautions  None      Home Environment   Living Environment  Private residence      Observation/Other Assessments   Skin Integrity  circumference: R calf: 44cm, L calf 38cm      Observation/Other Assessments-Edema    Edema  Circumferential  R calf: 44 cm, L calf 38 cm     ROM / Strength   AROM / PROM / Strength  AROM;Strength      AROM   AROM Assessment Site  Knee    Right/Left Knee  Right;Left    Right Knee Extension  4    Right Knee Flexion  82    Left Knee Extension  0    Left Knee Flexion  120      Strength   Overall Strength Comments  R hip grossly 3/5 in flexion, abd, and adduction    Strength Assessment Site  Knee    Right/Left Knee  Right;Left    Right Knee Flexion  3-/5   increased pain   Right Knee Extension  3-/5   increased pain   Left Knee Flexion  5/5    Left Knee Extension  5/5      Flexibility   Soft Tissue Assessment /Muscle Length  yes      Palpation   Palpation comment  pt with "knot" noted on lateral                    [facility] Adult PT Treatment/Exercise - 04/17/19 0001      Exercises   Exercises  Knee/Hip      Knee/Hip Exercises: Stretches   Passive Hamstring Stretch  Right;1 rep      Knee/Hip Exercises: Seated   Long Arc Quad  AROM;Right;1 set;5 reps      Knee/Hip Exercises: Supine   Quad Sets  AROM;Strengthening;Right;10 reps    Heel Slides  AROM;Right;1 set;10 reps       Modalities   Modalities  Cryotherapy;Moist Heat      Moist Heat Therapy   Number Minutes Moist Heat  10 Minutes    Moist Heat Location  Hip      Cryotherapy   Number Minutes Cryotherapy  10 Minutes    Cryotherapy Location  Knee    Type of Cryotherapy  Ice pack      Manual Therapy   Manual Therapy  Joint mobilization;Soft tissue mobilization;Passive ROM    Manual therapy comments  STW lumbar paraspinals, It band and lateral hip : pt with increased pain and could only tolerate gentle manual therapy today    Joint Mobilization  patella mobs    Passive ROM  R knee flexion/extension   more painful with flexion            PT Education - 04/17/19 0905    Education Details  POC adding knee exercises to HEP, how to access [employer]) Educated  Patient    Methods  Explanation;Demonstration;Other (comment)    Comprehension  Verbalized understanding;Returned demonstration       PT Short Term Goals - 04/17/19 0836      PT SHORT TERM GOAL #1   Title  demo proper heel-toe gait pattern    Baseline  flat foot, antalgic    Time  4    Period  Weeks    Status  On-going    Target Date  05/04/19      PT SHORT TERM GOAL #2   Title  Pt will demo proper resting posture without need for tactile/verbal cues    Baseline  began educating at eval    Time  4    Period  Weeks    Status  On-going    Target Date  05/04/19        PT Long Term  Goals - 04/17/19 0837      PT LONG TERM GOAL #1   Title  Pt will be able to complete work duties shoulder pain <=3/10    Baseline  severe and extreme pain    Time  10    Period  Weeks    Status  New      PT LONG TERM GOAL #2   Title  Pt will be able to walk comfortably for at least 20 min    Baseline  unable to walk comfortably at eval    Time  10    Period  Weeks    Status  New      PT LONG TERM GOAL #3   Title  GHJ strength to at least 4+/5    Baseline  3-/5 at eval    Time  10    Period  Weeks    Status  New       PT LONG TERM GOAL #4   Title  pt will be able to squat with proper form for lifting and assisting with home care    Baseline  unable dute to pain at eval    Time  10    Period  Weeks    Status  New      PT LONG TERM GOAL #5   Title  Pt will be able to improve her R LE knee stretgth to >/= 4/5 in order to improve gait and mobiltiy.    Baseline  3-/5 L knee    Time  10    Period  Weeks    Status  New      PT LONG TERM GOAL #6   Title  Pt will improve her R knee AROM: 0-115 degrees in order to improve functional mobility.    Baseline  AROM: 4-82    Time  10    Period  Weeks    Status  New            Plan - 04/17/19 [id number]    Clinical Impression Statement  Pt presenting today for further evaluation of R knee pain. Pt with limited AROM: 4-82 degrees with pain increasing to 10/10.Pain at rest was noted at 8/10. Pt with "knot" on posterior popliteal fossa which is extremely tender to palpation. Pt with 4 cm increased circumference in R calf compared to L with swelling noted from upper thigh to pt's ankles. Pt with tenderness noted in lateral hip and lumbar paraspinals with increased pain. Pt tolerating heat on latereal hip and ice on R knee  and reporting her pain decreased by end of session. New LTG's created for R knee strength improvement and AROM. Continue to assess L shoulder and R knee as needed. Also discussed folllow up with pt's MD to further evaluate knee pain.    Personal Factors and Comorbidities  Comorbidity 1;Fitness    Comorbidities  chronic pain, h/o CVA affecting Rt side    Examination-Activity Limitations  Bathing;Locomotion Level;Transfers;Bed Mobility;Reach Overhead;Bend;Sit;Caring for Others;Carry;Sleep;Squat;Stairs;Dressing;Hygiene/Grooming;Stand;Lift    Examination-Participation Restrictions  Cleaning;Community Activity;Driving    Stability/Clinical Decision Making  Evolving/Moderate complexity    PT Frequency  2x / week    PT Duration  8 weeks    PT  Treatment/Interventions  ADLs/Self Care Home Management;Cryotherapy;Electrical Stimulation;Ultrasound;Traction;Moist Heat;Iontophoresis 4mg /ml Dexamethasone;Gait training;Stair training;Functional mobility training;Therapeutic activities;Therapeutic exercise;Balance training;Patient/family education;Neuromuscular re-education;Manual techniques;Passive range of motion;Dry needling;Taping    PT Next Visit Plan  further evaluate knee pain and pelvis alignment, review posture, manual therapy to cervical region  PT Home Exercise Plan  scap retraction, shoulder rolls, upper trap stretch, diaphragmatic breathing, (Access Code: [email or web address], heel slides and quad sets)    Consulted and Agree with Plan of Care  Patient       Patient will benefit from skilled therapeutic intervention in order to improve the following deficits and impairments:  Abnormal gait, Decreased range of motion, Difficulty walking, Impaired UE functional use, Increased muscle spasms, Decreased activity tolerance, Pain, Improper body mechanics, Impaired flexibility, Decreased strength, Postural dysfunction, Impaired sensation  Visit Diagnosis: Chronic left shoulder pain  Chronic pain of right knee  Muscle weakness (generalized)  Abnormal posture     Problem List Patient Active Problem List   Diagnosis Date Noted  . Abnormal uterine bleeding (AUB) 02/13/2019  . Expressive aphasia   . 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 Acute CVA (cerebrovascular accident) ([place]) 04/04/2018  . Tobacco use 04/04/2018  . Heart palpitations 12/14/2016  . Intermittent chest pain 12/14/2016  . LVH (left ventricular hypertrophy) 12/14/2016  . Depression 11/15/2016  . Essential hypertension 11/15/2016  . Fibroids 01/18/2012  . Menorrhagia 01/18/2012  . Seizure disorder ([place]) 01/18/2012  . Anemia 01/18/2012    Oretha Caprice, PT 04/17/2019, 4:19 PM  [facility] [address] [place], [place], [address] Phone: [phone]   Fax:  [phone]  Name: Tina Moss MRN: [id number] Date of Birth: [birth year]

## 2019-04-17 NOTE — Patient Instructions (Signed)
   [email or web address]   Access Code: [email or web address]

## 2019-04-20 ENCOUNTER — Encounter: Payer: Self-pay | Admitting: Physical Therapy

## 2019-04-20 ENCOUNTER — Other Ambulatory Visit: Payer: Self-pay

## 2019-04-20 ENCOUNTER — Ambulatory Visit: Payer: Self-pay | Admitting: Physical Therapy

## 2019-04-20 DIAGNOSIS — G8929 Other chronic pain: Secondary | ICD-10-CM

## 2019-04-20 DIAGNOSIS — R293 Abnormal posture: Secondary | ICD-10-CM

## 2019-04-20 DIAGNOSIS — M6281 Muscle weakness (generalized): Secondary | ICD-10-CM

## 2019-04-20 DIAGNOSIS — M25561 Pain in right knee: Secondary | ICD-10-CM

## 2019-04-20 DIAGNOSIS — M25512 Pain in left shoulder: Secondary | ICD-10-CM

## 2019-04-20 NOTE — Therapy (Signed)
[place], [place], [address] Phone: [phone]   Fax:  [phone]  Physical Therapy Treatment  Patient Details  Name: Tina Moss MRN: [id number] Date of Birth: [birth year] Referring Provider (PT): Charlott Rakes, MD   Encounter Date: 04/20/2019  PT End of Session - 04/20/19 0810    Visit Number  3    Number of Visits  17    Date for PT Re-Evaluation  06/15/19    Authorization Type  Self pay, trying to get CAFA    PT Start Time  0745    PT Stop Time  0840    PT Time Calculation (min)  55 min    Activity Tolerance  Patient tolerated treatment well    Behavior During Therapy  [facility] for tasks assessed/performed       Past Medical History:  Diagnosis Date  . Anemia   . Epilepsy ([place])   . Fibroids   . H/O bacterial infection   . H/O mumps   . Hyperlipidemia   . Hypertension   . Seizures ([place])     Past Surgical History:  Procedure Laterality Date  . CESAREAN SECTION     x2. at term  . TEE WITHOUT CARDIOVERSION N/A 04/06/2018   Procedure: TRANSESOPHAGEAL ECHOCARDIOGRAM (TEE) BUBBLE STUDY;  Surgeon: Lelon Perla, MD;  Location: [facility] ENDOSCOPY;  Service: Cardiovascular;  Laterality: N/A;  . TUBAL LIGATION    . WISDOM TOOTH EXTRACTION      There were no vitals filed for this visit.  Subjective Assessment - 04/20/19 0755    Subjective  Pt arriving to therapy today reporting Left shoulder of 6/10 and R knee 8/10.  Pt is going to work after this and is concerned that helping lifting her patient has made her arm worse.    How long can you walk comfortably?  no time comfortably    Patient Stated Goals  get better    Currently in Pain?  Yes    Pain Score  8     Pain Location  Knee    Pain Orientation  Right    Pain Descriptors / Indicators  Shooting    Pain Type  Chronic pain    Pain Onset  More than a month ago    Pain Frequency  Constant    Pain Score  6    Pain Location  Shoulder     Pain Orientation  Left    Pain Descriptors / Indicators  Aching    Pain Type  Chronic pain    Pain Onset  More than a month ago                       [facility] Adult PT Treatment/Exercise - 04/20/19 0001      Exercises   Exercises  Knee/Hip      Knee/Hip Exercises: Stretches   Passive Hamstring Stretch  Right;1 rep      Knee/Hip Exercises: Aerobic   Nustep  L1 x 3 minutes, pt with limited tolerance due to pain in R knee      Knee/Hip Exercises: Seated   Long Arc Quad  --      Knee/Hip Exercises: Supine   Quad Sets  AROM;Strengthening;Right;10 reps    Heel Slides  AROM;Right;1 set;10 reps    Heel Slides Limitations  pt reporting increased pain today across patella and posterior knee    Other Supine Knee/Hip Exercises  glute sets x 10 holding 5  seconds    Other Supine Knee/Hip Exercises  ball squeezes x 10 holding 5 seconds each      Modalities   Modalities  Cryotherapy;Moist Heat      Moist Heat Therapy   Number Minutes Moist Heat  10 Minutes    Moist Heat Location  Hip      Cryotherapy   Number Minutes Cryotherapy  10 Minutes    Cryotherapy Location  Shoulder    Type of Cryotherapy  Ice pack      Manual Therapy   Manual Therapy  Joint mobilization;Soft tissue mobilization;Passive ROM    Manual therapy comments  STM to R knee, posterior hamstring tendons, pt with incresaed sensivitity    using Biofreeze   Joint Mobilization  patella mobs    Passive ROM  R knee flexion/extension, PROM L shoulder increased pain with all movements.    more painful with flexion            PT Education - 04/20/19 0809    Education Details  Reviewed verbally HEP    Person(s) Educated  Patient    Methods  Explanation    Comprehension  Verbalized understanding       PT Short Term Goals - 04/17/19 0836      PT SHORT TERM GOAL #1   Title  demo proper heel-toe gait pattern    Baseline  flat foot, antalgic    Time  4    Period  Weeks    Status  On-going    Target  Date  05/04/19      PT SHORT TERM GOAL #2   Title  Pt will demo proper resting posture without need for tactile/verbal cues    Baseline  began educating at eval    Time  4    Period  Weeks    Status  On-going    Target Date  05/04/19        PT Long Term Goals - 04/17/19 0837      PT LONG TERM GOAL #1   Title  Pt will be able to complete work duties shoulder pain <=3/10    Baseline  severe and extreme pain    Time  10    Period  Weeks    Status  New      PT LONG TERM GOAL #2   Title  Pt will be able to walk comfortably for at least 20 min    Baseline  unable to walk comfortably at eval    Time  10    Period  Weeks    Status  New      PT LONG TERM GOAL #3   Title  GHJ strength to at least 4+/5    Baseline  3-/5 at eval    Time  10    Period  Weeks    Status  New      PT LONG TERM GOAL #4   Title  pt will be able to squat with proper form for lifting and assisting with home care    Baseline  unable dute to pain at eval    Time  10    Period  Weeks    Status  New      PT LONG TERM GOAL #5   Title  Pt will be able to improve her R LE knee stretgth to >/= 4/5 in order to improve gait and mobiltiy.    Baseline  3-/5 L knee    Time  10  Period  Weeks    Status  New      PT LONG TERM GOAL #6   Title  Pt will improve her R knee AROM: 0-115 degrees in order to improve functional mobility.    Baseline  AROM: 4-82    Time  10    Period  Weeks    Status  New            Plan - 04/20/19 0849    Clinical Impression Statement  Pt arriving today repoting 8/10 pain in R knee and 6/10 pain in L shoulder. Pt with decrased tolerance to exercises due to incrased pain with movements in knee and shoulder. Pt also with increased sensitivity to touch tolerating only mild STM and PROM. Pt still with "knot" noted in posterior fossa of R knee and increased pain in anterior AC joint of Shoulder. Pt's swelling in R calf decreased by 1 centimeter to 41 centimeters. Pt reporting pain  decreased by 75% following biofreeze and ice and heat treatments. Continue skilled therapy to progress toward goals set.    Personal Factors and Comorbidities  Comorbidity 1;Fitness    Comorbidities  chronic pain, h/o CVA affecting Rt side    Examination-Activity Limitations  Bathing;Locomotion Level;Transfers;Bed Mobility;Reach Overhead;Bend;Sit;Caring for Others;Carry;Sleep;Squat;Stairs;Dressing;Hygiene/Grooming;Stand;Lift    Examination-Participation Restrictions  Cleaning;Community Activity;Driving    Stability/Clinical Decision Making  Evolving/Moderate complexity    Rehab Potential  Good    PT Frequency  2x / week    PT Duration  8 weeks    PT Treatment/Interventions  ADLs/Self Care Home Management;Cryotherapy;Electrical Stimulation;Ultrasound;Traction;Moist Heat;Iontophoresis 4mg /ml Dexamethasone;Gait training;Stair training;Functional mobility training;Therapeutic activities;Therapeutic exercise;Balance training;Patient/family education;Neuromuscular re-education;Manual techniques;Passive range of motion;Dry needling;Taping    PT Next Visit Plan  further evaluate knee pain and pelvis alignment, review posture, manual therapy to cervical region and L shoulder    PT Home Exercise Plan  scap retraction, shoulder rolls, upper trap stretch, diaphragmatic breathing, (Access Code: [email or web address], heel slides and quad sets)    Consulted and Agree with Plan of Care  Patient       Patient will benefit from skilled therapeutic intervention in order to improve the following deficits and impairments:  Abnormal gait, Decreased range of motion, Difficulty walking, Impaired UE functional use, Increased muscle spasms, Decreased activity tolerance, Pain, Improper body mechanics, Impaired flexibility, Decreased strength, Postural dysfunction, Impaired sensation  Visit Diagnosis: Chronic left shoulder pain  Chronic pain of right knee  Muscle weakness (generalized)  Abnormal posture     Problem  List Patient Active Problem List   Diagnosis Date Noted  . Abnormal uterine bleeding (AUB) 02/13/2019  . Expressive aphasia   . 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 Acute CVA (cerebrovascular accident) ([place]) 04/04/2018  . Tobacco use 04/04/2018  . Heart palpitations 12/14/2016  . Intermittent chest pain 12/14/2016  . LVH (left ventricular hypertrophy) 12/14/2016  . Depression 11/15/2016  . Essential hypertension 11/15/2016  . Fibroids 01/18/2012  . Menorrhagia 01/18/2012  . Seizure disorder ([place]) 01/18/2012  . Anemia 01/18/2012    Oretha Caprice, PT 04/20/2019, 9:02 AM  [facility] [address] [place], [place], [address] Phone: [phone]   Fax:  [phone]  Name: Tina Moss MRN: [id number] Date of Birth: 08/03/1970

## 2019-04-23 ENCOUNTER — Ambulatory Visit: Payer: Self-pay | Admitting: Physical Therapy

## 2019-04-23 ENCOUNTER — Other Ambulatory Visit: Payer: Self-pay

## 2019-04-23 ENCOUNTER — Encounter: Payer: Self-pay | Admitting: Physical Therapy

## 2019-04-23 DIAGNOSIS — R293 Abnormal posture: Secondary | ICD-10-CM

## 2019-04-23 DIAGNOSIS — G8929 Other chronic pain: Secondary | ICD-10-CM

## 2019-04-23 DIAGNOSIS — M25561 Pain in right knee: Secondary | ICD-10-CM

## 2019-04-23 DIAGNOSIS — M25512 Pain in left shoulder: Secondary | ICD-10-CM

## 2019-04-23 DIAGNOSIS — M6281 Muscle weakness (generalized): Secondary | ICD-10-CM

## 2019-04-23 NOTE — Therapy (Signed)
[place], [place], [address] Phone: [phone]   Fax:  [phone]  Physical Therapy Treatment  Patient Details  Name: Tina Moss MRN: [id number] Date of Birth: Jul 17, 1970 Referring Provider (PT): Charlott Rakes, MD   Encounter Date: 04/23/2019  PT End of Session - 04/23/19 0730    Visit Number  4    Number of Visits  17    Date for PT Re-Evaluation  06/15/19    Authorization Type  Self pay, trying to get CAFA    PT Start Time  0727   12 minutes late   PT Stop Time  0810    PT Time Calculation (min)  43 min       Past Medical History:  Diagnosis Date  . Anemia   . Epilepsy ([place])   . Fibroids   . H/O bacterial infection   . H/O mumps   . Hyperlipidemia   . Hypertension   . Seizures ([place])     Past Surgical History:  Procedure Laterality Date  . CESAREAN SECTION     x2. at term  . TEE WITHOUT CARDIOVERSION N/A 04/06/2018   Procedure: TRANSESOPHAGEAL ECHOCARDIOGRAM (TEE) BUBBLE STUDY;  Surgeon: Lelon Perla, MD;  Location: [facility] ENDOSCOPY;  Service: Cardiovascular;  Laterality: N/A;  . TUBAL LIGATION    . WISDOM TOOTH EXTRACTION      There were no vitals filed for this visit.  Subjective Assessment - 04/23/19 0729    Subjective  Back is hurting this morning. I am having tingling at my knee but no pain.  Shoulder is not too bad.    Pain Score  0-No pain    Pain Location  Knee    Multiple Pain Sites  Yes    Pain Score  0    Pain Location  Shoulder    Pain Score  8    Pain Location  Back    Pain Orientation  Lower    Pain Descriptors / Indicators  Aching         [facility] PT Assessment - 04/23/19 0001      AROM   Right Knee Extension  4    Right Knee Flexion  100                   [facility] Adult PT Treatment/Exercise - 04/23/19 0001      Knee/Hip Exercises: Stretches   Passive Hamstring Stretch  Right;2 reps      Knee/Hip Exercises: Seated   Long Arc Quad  20  reps      Knee/Hip Exercises: Supine   Quad Sets  AROM;Strengthening;Right;10 reps    Short Arc Quad Sets  20 reps    Heel Slides  20 reps    Heel Slides Limitations  1 set with shet for assist     Straight Leg Raises  10 reps    Straight Leg Raises Limitations  cues for abdominal draw in      Knee/Hip Exercises: Sidelying   Clams  10      Shoulder Exercises: Supine   Other Supine Exercises  Cane press ups, pullovers and ER AAROM x 10 each, cues to keep comfortable, Increased pain with shoulder flexion.       Moist Heat Therapy   Number Minutes Moist Heat  15 Minutes    Moist Heat Location  Hip   low back in chair  PT Short Term Goals - 04/17/19 0836      PT SHORT TERM GOAL #1   Title  demo proper heel-toe gait pattern    Baseline  flat foot, antalgic    Time  4    Period  Weeks    Status  On-going    Target Date  05/04/19      PT SHORT TERM GOAL #2   Title  Pt will demo proper resting posture without need for tactile/verbal cues    Baseline  began educating at eval    Time  4    Period  Weeks    Status  On-going    Target Date  05/04/19        PT Long Term Goals - 04/17/19 0837      PT LONG TERM GOAL #1   Title  Pt will be able to complete work duties shoulder pain <=3/10    Baseline  severe and extreme pain    Time  10    Period  Weeks    Status  New      PT LONG TERM GOAL #2   Title  Pt will be able to walk comfortably for at least 20 min    Baseline  unable to walk comfortably at eval    Time  10    Period  Weeks    Status  New      PT LONG TERM GOAL #3   Title  GHJ strength to at least 4+/5    Baseline  3-/5 at eval    Time  10    Period  Weeks    Status  New      PT LONG TERM GOAL #4   Title  pt will be able to squat with proper form for lifting and assisting with home care    Baseline  unable dute to pain at eval    Time  10    Period  Weeks    Status  New      PT LONG TERM GOAL #5   Title  Pt will be able to  improve her R LE knee stretgth to >/= 4/5 in order to improve gait and mobiltiy.    Baseline  3-/5 L knee    Time  10    Period  Weeks    Status  New      PT LONG TERM GOAL #6   Title  Pt will improve her R knee AROM: 0-115 degrees in order to improve functional mobility.    Baseline  AROM: 4-82    Time  10    Period  Weeks    Status  New            Plan - 04/23/19 0941    Clinical Impression Statement  Pt arrives reporting increased low back pain that is across lower lumbar. SHe reports she normally has right sided back pain only. She reports tingling in anterior knee today. She reports shoulder is not panful at rest. Shorter session today due to patient being late. Increased pain with supine shoulder flexion AAROM. Slow guarded motions with LE therex. HMP placed at lumbar spine end of session in seated.    PT Next Visit Plan  further evaluate knee pain and pelvis alignment, review posture, manual therapy to cervical region and L shoulder    PT Home Exercise Plan  scap retraction, shoulder rolls, upper trap stretch, diaphragmatic breathing, (Access Code: [email or web address], heel slides and quad sets)  Patient will benefit from skilled therapeutic intervention in order to improve the following deficits and impairments:  Abnormal gait, Decreased range of motion, Difficulty walking, Impaired UE functional use, Increased muscle spasms, Decreased activity tolerance, Pain, Improper body mechanics, Impaired flexibility, Decreased strength, Postural dysfunction, Impaired sensation  Visit Diagnosis: Chronic left shoulder pain  Chronic pain of right knee  Muscle weakness (generalized)  Abnormal posture     Problem List Patient Active Problem List   Diagnosis Date Noted  . Abnormal uterine bleeding (AUB) 02/13/2019  . Expressive aphasia   . 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 Acute CVA (cerebrovascular accident) ([place]) 04/04/2018  . Tobacco use 04/04/2018  . Heart  palpitations 12/14/2016  . Intermittent chest pain 12/14/2016  . LVH (left ventricular hypertrophy) 12/14/2016  . Depression 11/15/2016  . Essential hypertension 11/15/2016  . Fibroids 01/18/2012  . Menorrhagia 01/18/2012  . Seizure disorder ([place]) 01/18/2012  . Anemia 01/18/2012    Tina Moss, PTA 04/23/2019, 9:44 AM  [facility] [address] [place], [place], [address] Phone: [phone]   Fax:  [phone]  Name: Tina Moss MRN: [id number] Date of Birth: 08-06-1970

## 2019-04-26 ENCOUNTER — Ambulatory Visit: Payer: Self-pay | Admitting: Physical Therapy

## 2019-04-26 ENCOUNTER — Telehealth: Payer: Self-pay | Admitting: Physical Therapy

## 2019-04-26 NOTE — Telephone Encounter (Signed)
Spoke with pt about no show this morning, reports her alarm did not go off. We are full tomorrow but asked her to call back around lunch to see if we have any cancels this afternoon to RS. Betsi Crespi C. Isla Sabree PT, DPT 04/26/19 10:03 AM

## 2019-05-01 ENCOUNTER — Ambulatory Visit: Payer: Self-pay | Admitting: Physical Therapy

## 2019-05-03 ENCOUNTER — Ambulatory Visit: Payer: Self-pay | Admitting: Physical Therapy

## 2019-05-03 ENCOUNTER — Other Ambulatory Visit: Payer: Self-pay

## 2019-05-03 ENCOUNTER — Encounter: Payer: Self-pay | Admitting: Physical Therapy

## 2019-05-03 DIAGNOSIS — M25512 Pain in left shoulder: Secondary | ICD-10-CM

## 2019-05-03 DIAGNOSIS — M6281 Muscle weakness (generalized): Secondary | ICD-10-CM

## 2019-05-03 DIAGNOSIS — R293 Abnormal posture: Secondary | ICD-10-CM

## 2019-05-03 DIAGNOSIS — G8929 Other chronic pain: Secondary | ICD-10-CM

## 2019-05-03 NOTE — Therapy (Signed)
[place], [place], [address] Phone: [phone]   Fax:  [phone]  Physical Therapy Treatment  Patient Details  Name: Tina Moss MRN: [id number] Date of Birth: [birth year] Referring Provider (PT): Charlott Rakes, MD   Encounter Date: 05/03/2019  PT End of Session - 05/03/19 0811    Visit Number  5    Number of Visits  17    Date for PT Re-Evaluation  06/15/19    Authorization Type  Self pay, trying to get CAFA    PT Start Time  0803    PT Stop Time  [id number]    PT Time Calculation (min)  54 min    Activity Tolerance  Patient limited by pain    Behavior During Therapy  Anxious       Past Medical History:  Diagnosis Date  . Anemia   . Epilepsy ([place])   . Fibroids   . H/O bacterial infection   . H/O mumps   . Hyperlipidemia   . Hypertension   . Seizures ([place])     Past Surgical History:  Procedure Laterality Date  . CESAREAN SECTION     x2. at term  . TEE WITHOUT CARDIOVERSION N/A 04/06/2018   Procedure: TRANSESOPHAGEAL ECHOCARDIOGRAM (TEE) BUBBLE STUDY;  Surgeon: Lelon Perla, MD;  Location: [facility] ENDOSCOPY;  Service: Cardiovascular;  Laterality: N/A;  . TUBAL LIGATION    . WISDOM TOOTH EXTRACTION      There were no vitals filed for this visit.  Subjective Assessment - 05/03/19 0805    Subjective  If I walk a whole lot my knee hurts but otherwise it is minimal. Aches before I go to bed. Reaching for the seatbelt or putting shirt on makes my shoulder throb.                       [facility] Adult PT Treatment/Exercise - 05/03/19 0001      Knee/Hip Exercises: Aerobic   Nustep  6 min L4      Modalities   Modalities  Ultrasound      Moist Heat Therapy   Number Minutes Moist Heat  10 Minutes    Moist Heat Location  Shoulder      Ultrasound   Ultrasound Location  Lt upper trap    Ultrasound Parameters  .4 w/cm2 pulsed 8 min    Ultrasound Goals  Pain      Manual  Therapy   Manual Therapy  Soft tissue mobilization    Manual therapy comments  educated on use of theracane and tennis ball for desensitization    Soft tissue mobilization  attempted IASTM to Lt upper trap             PT Education - 05/03/19 0953    Education Details  see plan    Person(s) Educated  Patient    Methods  Explanation;Demonstration;Tactile cues;Verbal cues    Comprehension  Verbalized understanding;Returned demonstration;Verbal cues required;Tactile cues required;Need further instruction       PT Short Term Goals - 04/17/19 0836      PT SHORT TERM GOAL #1   Title  demo proper heel-toe gait pattern    Baseline  flat foot, antalgic    Time  4    Period  Weeks    Status  On-going    Target Date  05/04/19      PT SHORT TERM GOAL #2   Title  Pt will demo proper  resting posture without need for tactile/verbal cues    Baseline  began educating at eval    Time  4    Period  Weeks    Status  On-going    Target Date  05/04/19        PT Long Term Goals - 04/17/19 0837      PT LONG TERM GOAL #1   Title  Pt will be able to complete work duties shoulder pain <=3/10    Baseline  severe and extreme pain    Time  10    Period  Weeks    Status  New      PT LONG TERM GOAL #2   Title  Pt will be able to walk comfortably for at least 20 min    Baseline  unable to walk comfortably at eval    Time  10    Period  Weeks    Status  New      PT LONG TERM GOAL #3   Title  GHJ strength to at least 4+/5    Baseline  3-/5 at eval    Time  10    Period  Weeks    Status  New      PT LONG TERM GOAL #4   Title  pt will be able to squat with proper form for lifting and assisting with home care    Baseline  unable dute to pain at eval    Time  10    Period  Weeks    Status  New      PT LONG TERM GOAL #5   Title  Pt will be able to improve her R LE knee stretgth to >/= 4/5 in order to improve gait and mobiltiy.    Baseline  3-/5 L knee    Time  10    Period  Weeks     Status  New      PT LONG TERM GOAL #6   Title  Pt will improve her R knee AROM: 0-115 degrees in order to improve functional mobility.    Baseline  AROM: 4-82    Time  10    Period  Weeks    Status  New            Plan - 05/03/19 0948    Clinical Impression Statement  Pt reports her shoulder is what is bothering her the most. notable elevation with hypertrophy of upper trap on Left side. A good amount of our session was spent discussing pain sensitivity and POC as she was barely able to tolerate a gentle touch of [place] head on her upper trap. Asked her to use tennis ball for self STM and to look at posture in the mirror in attempt to decrease Lt GHJ elevation. We discussed that the knot in her muscle is not a nerve that goes to her hand and sensitivity is beyond damage to tissue. Pt verbalized understanding that this can improve but surgical intervention would not correct this particular issue.    PT Treatment/Interventions  ADLs/Self Care Home Management;Cryotherapy;Electrical Stimulation;Ultrasound;Traction;Moist Heat;Iontophoresis 4mg /ml Dexamethasone;Gait training;Stair training;Functional mobility training;Therapeutic activities;Therapeutic exercise;Balance training;Patient/family education;Neuromuscular re-education;Manual techniques;Passive range of motion;Dry needling;Taping    PT Next Visit Plan  continue to try STM to Lt shoulder, pulleys, AAROM    PT Home Exercise Plan  scap retraction, shoulder rolls, upper trap stretch, diaphragmatic breathing, (Access Code: [email or web address], heel slides and quad sets)    Consulted and Agree with Plan of Care  Patient       Patient will benefit from skilled therapeutic intervention in order to improve the following deficits and impairments:  Abnormal gait, Decreased range of motion, Difficulty walking, Impaired UE functional use, Increased muscle spasms, Decreased activity tolerance, Pain, Improper body mechanics, Impaired flexibility, Decreased  strength, Postural dysfunction, Impaired sensation  Visit Diagnosis: Chronic left shoulder pain  Chronic pain of right knee  Muscle weakness (generalized)  Abnormal posture     Problem List Patient Active Problem List   Diagnosis Date Noted  . Abnormal uterine bleeding (AUB) 02/13/2019  . Expressive aphasia   . 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 Acute CVA (cerebrovascular accident) ([place]) 04/04/2018  . Tobacco use 04/04/2018  . Heart palpitations 12/14/2016  . Intermittent chest pain 12/14/2016  . LVH (left ventricular hypertrophy) 12/14/2016  . Depression 11/15/2016  . Essential hypertension 11/15/2016  . Fibroids 01/18/2012  . Menorrhagia 01/18/2012  . Seizure disorder ([place]) 01/18/2012  . Anemia 01/18/2012    Rachelann Enloe C. Imraan Wendell PT, DPT 05/03/19 9:54 AM   [facility] [address] [place], [place], [address] Phone: [phone]   Fax:  [phone]  Name: Tina Moss MRN: [id number] Date of Birth: 06-12-1971

## 2019-05-07 ENCOUNTER — Other Ambulatory Visit: Payer: Self-pay

## 2019-05-07 ENCOUNTER — Encounter: Payer: Self-pay | Admitting: Physical Therapy

## 2019-05-07 ENCOUNTER — Ambulatory Visit: Payer: Self-pay | Admitting: Physical Therapy

## 2019-05-07 DIAGNOSIS — M25512 Pain in left shoulder: Secondary | ICD-10-CM

## 2019-05-07 DIAGNOSIS — M6281 Muscle weakness (generalized): Secondary | ICD-10-CM

## 2019-05-07 DIAGNOSIS — M25561 Pain in right knee: Secondary | ICD-10-CM

## 2019-05-07 DIAGNOSIS — I63512 Cerebral infarction due to unspecified occlusion or stenosis of left middle cerebral artery: Secondary | ICD-10-CM

## 2019-05-07 DIAGNOSIS — G8929 Other chronic pain: Secondary | ICD-10-CM

## 2019-05-07 DIAGNOSIS — R293 Abnormal posture: Secondary | ICD-10-CM

## 2019-05-07 NOTE — Therapy (Signed)
[place], [place], [address] Phone: [phone]   Fax:  [phone]  Physical Therapy Treatment  Patient Details  Name: Tina Moss MRN: [id number] Date of Birth: 1970-12-17 Referring Provider (PT): Charlott Rakes, MD   Encounter Date: 05/07/2019  PT End of Session - 05/07/19 0721    Visit Number  6    Number of Visits  17    Date for PT Re-Evaluation  06/15/19    Authorization Type  Self pay, trying to get CAFA    PT Start Time  0715    PT Stop Time  0810    PT Time Calculation (min)  55 min       Past Medical History:  Diagnosis Date  . Anemia   . Epilepsy ([place])   . Fibroids   . H/O bacterial infection   . H/O mumps   . Hyperlipidemia   . Hypertension   . Seizures ([place])     Past Surgical History:  Procedure Laterality Date  . CESAREAN SECTION     x2. at term  . TEE WITHOUT CARDIOVERSION N/A 04/06/2018   Procedure: TRANSESOPHAGEAL ECHOCARDIOGRAM (TEE) BUBBLE STUDY;  Surgeon: Lelon Perla, MD;  Location: [facility] ENDOSCOPY;  Service: Cardiovascular;  Laterality: N/A;  . TUBAL LIGATION    . WISDOM TOOTH EXTRACTION      There were no vitals filed for this visit.  Subjective Assessment - 05/07/19 0717    Subjective  The shoulder is achey when I try to lay down and it hurts when I rotate my arm out to the side.    Currently in Pain?  Yes    Pain Score  7    increased achiness with positions   Pain Location  Knee    Pain Orientation  Right    Pain Descriptors / Indicators  --   like something is sitting on it , top and under   Pain Type  Chronic pain    Aggravating Factors   walking    Pain Relieving Factors  sitting    Pain Score  0    Pain Location  Shoulder    Pain Orientation  Left    Pain Descriptors / Indicators  Aching   inreased achiness with certain positions   Aggravating Factors   laying on back, rotating arm out    Pain Relieving Factors  rest         [facility] PT  Assessment - 05/07/19 0001      AROM   Right Knee Flexion  108   using strap                   [facility] Adult PT Treatment/Exercise - 05/07/19 0001      Knee/Hip Exercises: Stretches   Other Knee/Hip Stretches  slant board x 30 sec       Knee/Hip Exercises: Aerobic   Nustep  L4 UE/LE x 5 minutes    discontinued using LUE due to pain     Knee/Hip Exercises: Standing   Other Standing Knee Exercises  standing 3 way hip x 10 bilateral at counter top       Knee/Hip Exercises: Seated   Long Arc Quad  20 reps      Knee/Hip Exercises: Supine   Quad Sets  10 reps    Straight Leg Raises  10 reps    Straight Leg Raises Limitations  cues for abdominal drace and extended knee.  Other Supine Knee/Hip Exercises  --      Shoulder Exercises: Supine   Other Supine Exercises  supine scap depresson and retraction x 10      Other Supine Exercises  Cane press ups, pullovers and ER AAROM x 10 each, cues to keep comfortable      Shoulder Exercises: Pulleys   Flexion  3 minutes      Moist Heat Therapy   Number Minutes Moist Heat  10 Minutes    Moist Heat Location  Shoulder      Manual Therapy   Soft tissue mobilization  light touch and desensitzation with towel , reports numb aching pain now her LUE with manual, unable to toerate.                PT Short Term Goals - 04/17/19 0836      PT SHORT TERM GOAL #1   Title  demo proper heel-toe gait pattern    Baseline  flat foot, antalgic    Time  4    Period  Weeks    Status  On-going    Target Date  05/04/19      PT SHORT TERM GOAL #2   Title  Pt will demo proper resting posture without need for tactile/verbal cues    Baseline  began educating at eval    Time  4    Period  Weeks    Status  On-going    Target Date  05/04/19        PT Long Term Goals - 04/17/19 0837      PT LONG TERM GOAL #1   Title  Pt will be able to complete work duties shoulder pain <=3/10    Baseline  severe and extreme pain    Time  10     Period  Weeks    Status  New      PT LONG TERM GOAL #2   Title  Pt will be able to walk comfortably for at least 20 min    Baseline  unable to walk comfortably at eval    Time  10    Period  Weeks    Status  New      PT LONG TERM GOAL #3   Title  GHJ strength to at least 4+/5    Baseline  3-/5 at eval    Time  10    Period  Weeks    Status  New      PT LONG TERM GOAL #4   Title  pt will be able to squat with proper form for lifting and assisting with home care    Baseline  unable dute to pain at eval    Time  10    Period  Weeks    Status  New      PT LONG TERM GOAL #5   Title  Pt will be able to improve her R LE knee stretgth to >/= 4/5 in order to improve gait and mobiltiy.    Baseline  3-/5 L knee    Time  10    Period  Weeks    Status  New      PT LONG TERM GOAL #6   Title  Pt will improve her R knee AROM: 0-115 degrees in order to improve functional mobility.    Baseline  AROM: 4-82    Time  10    Period  Weeks    Status  New  Plan - 05/07/19 0748    Clinical Impression Statement  Pt reports improvement in mobility of knee however has increased pain afterward. She demonstrates increased Knee AROM. She reports attempting to massage upper trap with tennis ball. She is unable to tolerate light touch and desensitization with towel which she reports a numbing ache goes down her LUE.    Examination-Activity Limitations  Bathing;Locomotion Level;Transfers;Bed Mobility;Reach Overhead;Bend;Sit;Caring for Others;Carry;Sleep;Squat;Stairs;Dressing;Hygiene/Grooming;Stand;Lift    PT Treatment/Interventions  ADLs/Self Care Home Management;Cryotherapy;Electrical Stimulation;Ultrasound;Traction;Moist Heat;Iontophoresis 4mg /ml Dexamethasone;Gait training;Stair training;Functional mobility training;Therapeutic activities;Therapeutic exercise;Balance training;Patient/family education;Neuromuscular re-education;Manual techniques;Passive range of motion;Dry needling;Taping     PT Next Visit Plan  continue to try STM to Lt shoulder, pulleys, AAROM    PT Home Exercise Plan  scap retraction, shoulder rolls, upper trap stretch, diaphragmatic breathing, (Access Code: [email or web address], heel slides and quad sets)       Patient will benefit from skilled therapeutic intervention in order to improve the following deficits and impairments:  Abnormal gait, Decreased range of motion, Difficulty walking, Impaired UE functional use, Increased muscle spasms, Decreased activity tolerance, Pain, Improper body mechanics, Impaired flexibility, Decreased strength, Postural dysfunction, Impaired sensation  Visit Diagnosis: Chronic left shoulder pain  Chronic pain of right knee  Muscle weakness (generalized)  Abnormal posture  Left middle cerebral artery stroke [facility])     Problem List Patient Active Problem List   Diagnosis Date Noted  . Abnormal uterine bleeding (AUB) 02/13/2019  . Expressive aphasia   . 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 Acute CVA (cerebrovascular accident) ([place]) 04/04/2018  . Tobacco use 04/04/2018  . Heart palpitations 12/14/2016  . Intermittent chest pain 12/14/2016  . LVH (left ventricular hypertrophy) 12/14/2016  . Depression 11/15/2016  . Essential hypertension 11/15/2016  . Fibroids 01/18/2012  . Menorrhagia 01/18/2012  . Seizure disorder ([place]) 01/18/2012  . Anemia 01/18/2012    Dorene Ar, PTA 05/07/2019, 8:30 AM  [place], [place], [address] Phone: [phone]   Fax:  [phone]  Name: Tina Moss MRN: [id number] Date of Birth: 11-21-1970

## 2019-05-09 ENCOUNTER — Ambulatory Visit: Payer: Self-pay | Admitting: Physical Therapy

## 2019-05-09 ENCOUNTER — Encounter: Payer: Self-pay | Admitting: Physical Therapy

## 2019-05-09 ENCOUNTER — Other Ambulatory Visit: Payer: Self-pay

## 2019-05-09 DIAGNOSIS — M25512 Pain in left shoulder: Secondary | ICD-10-CM

## 2019-05-09 DIAGNOSIS — G8929 Other chronic pain: Secondary | ICD-10-CM

## 2019-05-09 DIAGNOSIS — M6281 Muscle weakness (generalized): Secondary | ICD-10-CM

## 2019-05-09 DIAGNOSIS — I63512 Cerebral infarction due to unspecified occlusion or stenosis of left middle cerebral artery: Secondary | ICD-10-CM

## 2019-05-09 DIAGNOSIS — R293 Abnormal posture: Secondary | ICD-10-CM

## 2019-05-09 NOTE — Therapy (Addendum)
[place], [place], [address] Phone: [phone]   Fax:  [phone]  Physical Therapy Treatment/Discharge  Patient Details  Name: Tina Moss MRN: [id number] Date of Birth: [birth year] Referring Provider (PT): Charlott Rakes, MD   Encounter Date: 05/09/2019  PT End of Session - 05/09/19 0721    Visit Number  7    Number of Visits  17    Date for PT Re-Evaluation  06/15/19    Authorization Type  Self pay, trying to get CAFA    PT Start Time  0715    PT Stop Time  0805    PT Time Calculation (min)  50 min       Past Medical History:  Diagnosis Date  . Anemia   . Epilepsy ([place])   . Fibroids   . H/O bacterial infection   . H/O mumps   . Hyperlipidemia   . Hypertension   . Seizures ([place])     Past Surgical History:  Procedure Laterality Date  . CESAREAN SECTION     x2. at term  . TEE WITHOUT CARDIOVERSION N/A 04/06/2018   Procedure: TRANSESOPHAGEAL ECHOCARDIOGRAM (TEE) BUBBLE STUDY;  Surgeon: Lelon Perla, MD;  Location: [facility] ENDOSCOPY;  Service: Cardiovascular;  Laterality: N/A;  . TUBAL LIGATION    . WISDOM TOOTH EXTRACTION      There were no vitals filed for this visit.  Subjective Assessment - 05/09/19 0718    Subjective  My shoulder is not hurting now. I try not to use it when it is aching. I do alot with my right arm instead. My knee is hurting more today 8/10.    Currently in Pain?  Yes    Pain Score  8     Pain Location  Knee    Pain Orientation  Right    Pain Descriptors / Indicators  Aching    Pain Type  Chronic pain    Pain Score  0    Pain Location  Shoulder    Pain Orientation  Left    Pain Type  Chronic pain         [facility] PT Assessment - 05/09/19 0001      AROM   Left Shoulder Flexion  95 Degrees    Left Shoulder ABduction  --   scaption 105   Left Shoulder Internal Rotation  --   reach lower lumbar    Left Shoulder External Rotation  --   Reach T1     PROM   Overall PROM Comments  AAROM shoulder 120 at wall ladder                    [facility] Adult PT Treatment/Exercise - 05/09/19 0001      Knee/Hip Exercises: Stretches   Active Hamstring Stretch  3 reps;10 seconds      Knee/Hip Exercises: Aerobic   Nustep  5 minutes UE/LE x 5 min      Knee/Hip Exercises: Seated   Long Arc Quad  10 reps    Other Seated Knee/Hip Exercises  quad set with foot on floor x 10- mod cues able to return demo       Shoulder Exercises: Standing   External Rotation  10 reps    Theraband Level (Shoulder External Rotation)  Level 1 (Yellow)    Internal Rotation  10 reps    Theraband Level (Shoulder Internal Rotation)  Level 1 (Yellow)    Row  10 reps  Theraband Level (Shoulder Row)  Level 1 (Yellow)    Other Standing Exercises  standing cane flexion, scaption, IR,      Shoulder Exercises: Pulleys   Flexion  2 minutes      Shoulder Exercises: ROM/Strengthening   Other ROM/Strengthening Exercises  wall ladder flexion x 5 to around 125 degrees      Cryotherapy   Number Minutes Cryotherapy  5 Minutes   able to tolerate more than 5 minutes    Cryotherapy Location  Knee    Type of Cryotherapy  Ice pack      Manual Therapy   Soft tissue mobilization  IASTM though pants to VL reports sensation running thorugh body.                PT Short Term Goals - 05/09/19 0908      PT SHORT TERM GOAL #1   Title  demo proper heel-toe gait pattern    Baseline  antalgic    Time  4    Period  Weeks    Status  On-going      PT SHORT TERM GOAL #2   Title  Pt will demo proper resting posture without need for tactile/verbal cues    Baseline  needs cues    Time  4    Period  Weeks    Status  On-going        PT Long Term Goals - 05/09/19 0909      PT LONG TERM GOAL #1   Title  Pt will be able to complete work duties shoulder pain <=3/10    Time  10    Period  Weeks    Status  Unable to assess      PT LONG TERM GOAL #2   Title  Pt will  be able to walk comfortably for at least 20 min    Baseline  improved to 5-10 minutes max    Time  10    Period  Weeks    Status  On-going      PT LONG TERM GOAL #3   Title  GHJ strength to at least 4+/5    Baseline  3-/5 at eval    Time  10    Status  On-going      PT LONG TERM GOAL #4   Title  pt will be able to squat with proper form for lifting and assisting with home care    Baseline  unable dute to pain at eval    Time  10    Period  Weeks    Status  Unable to assess      PT LONG TERM GOAL #5   Title  Pt will be able to improve her R LE knee stretgth to >/= 4/5 in order to improve gait and mobiltiy.    Baseline  3-/5 L knee    Time  10    Period  Weeks    Status  Unable to assess      PT LONG TERM GOAL #6   Title  Pt will improve her R knee AROM: 0-115 degrees in order to improve functional mobility.    Baseline  108 flexion best    Time  10    Period  Weeks    Status  On-going            Plan - 05/09/19 3838    Clinical Impression Statement  Pt reports some improvement in knee. She can walk longer, around  5-10 minutes) and drive longer. She is using left shoulder as able for ADLS however does not lift or carry. Worked on [employer] of shoulder and gentle strengthening which she reported some increased pain but was able to perform. She began to have right sided low back pain with standing therex. In seated and supine worked on quad setting which she was able to do without pain. Heel slides supine cause increased pain and she reports "something is in there" and points to lateral knee. IASTM to VL with min tolerance and guarding. Education on use of ice for pain provided whch she has never used. She was only able to tolerate ice pack to right knee for 5 minutes. Afterward she did report decreased pain in knee.    PT Next Visit Plan  continue to try STM to Lt shoulder ( needs PNE, desensitization) , pulleys, AAROM (update HEP next visit) ; progress knee therex as tolerated.  FOTO, check goals    PT Home Exercise Plan  scap retraction, shoulder rolls, upper trap stretch, diaphragmatic breathing, (Access Code: [email or web address], heel slides and quad sets)       Patient will benefit from skilled therapeutic intervention in order to improve the following deficits and impairments:  Abnormal gait, Decreased range of motion, Difficulty walking, Impaired UE functional use, Increased muscle spasms, Decreased activity tolerance, Pain, Improper body mechanics, Impaired flexibility, Decreased strength, Postural dysfunction, Impaired sensation  Visit Diagnosis: Chronic left shoulder pain  Chronic pain of right knee  Muscle weakness (generalized)  Abnormal posture  Left middle cerebral artery stroke [facility])     Problem List Patient Active Problem List   Diagnosis Date Noted  . Abnormal uterine bleeding (AUB) 02/13/2019  . Expressive aphasia   . 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 Acute CVA (cerebrovascular accident) ([place]) 04/04/2018  . Tobacco use 04/04/2018  . Heart palpitations 12/14/2016  . Intermittent chest pain 12/14/2016  . LVH (left ventricular hypertrophy) 12/14/2016  . Depression 11/15/2016  . Essential hypertension 11/15/2016  . Fibroids 01/18/2012  . Menorrhagia 01/18/2012  . Seizure disorder ([place]) 01/18/2012  . Anemia 01/18/2012    Dorene Ar, PTA 05/09/2019, 9:15 AM  [facility] [address] [place], [place], [address] Phone: [phone]   Fax:  [phone]  Name: Tina Moss MRN: [id number] Date of Birth: 1970/06/19  PHYSICAL THERAPY DISCHARGE SUMMARY  Visits from Start of Care: 7  Current functional level related to goals / functional outcomes: See above   Remaining deficits: See above   Education / Equipment: Anatomy of condition, POC, HEP, exercise form/rationale  Plan: Patient agrees to discharge.  Patient goals were not met. Patient is being  discharged due to the patient's request.  ?????   Job schedule change.    Tina Moss PT, DPT 06/28/19 2:23 PM

## 2019-05-15 ENCOUNTER — Ambulatory Visit: Payer: Self-pay | Admitting: Physical Therapy

## 2019-05-16 ENCOUNTER — Ambulatory Visit: Payer: Self-pay | Admitting: Family Medicine

## 2019-05-17 ENCOUNTER — Ambulatory Visit: Payer: Self-pay | Admitting: Physical Therapy

## 2019-05-22 ENCOUNTER — Ambulatory Visit: Payer: Self-pay | Admitting: Physical Therapy

## 2019-05-24 ENCOUNTER — Ambulatory Visit: Payer: Self-pay | Admitting: Physical Therapy

## 2019-05-29 ENCOUNTER — Encounter: Payer: Self-pay | Admitting: Physical Therapy

## 2019-05-31 ENCOUNTER — Ambulatory Visit: Payer: Self-pay | Attending: Family Medicine | Admitting: Physical Therapy

## 2019-06-05 ENCOUNTER — Encounter: Payer: Self-pay | Admitting: Physical Therapy

## 2019-06-06 ENCOUNTER — Ambulatory Visit: Payer: Self-pay | Admitting: Physical Therapy

## 2019-06-07 ENCOUNTER — Encounter: Payer: Self-pay | Admitting: Physical Therapy

## 2019-06-13 ENCOUNTER — Encounter: Payer: Self-pay | Admitting: Obstetrics and Gynecology

## 2019-06-13 ENCOUNTER — Ambulatory Visit: Payer: Self-pay | Admitting: Obstetrics and Gynecology

## 2019-06-13 NOTE — Progress Notes (Unsigned)
Patient did not keep her GYN appointment for 06/13/2019.  Durene Romans MD Attending Center for [employer])

## 2019-06-14 ENCOUNTER — Encounter

## 2019-07-06 ENCOUNTER — Other Ambulatory Visit (HOSPITAL_COMMUNITY): Payer: Self-pay | Admitting: *Deleted

## 2019-07-06 DIAGNOSIS — N644 Mastodynia: Secondary | ICD-10-CM

## 2019-07-30 ENCOUNTER — Other Ambulatory Visit: Payer: Self-pay

## 2019-07-30 DIAGNOSIS — N644 Mastodynia: Secondary | ICD-10-CM

## 2019-07-31 ENCOUNTER — Other Ambulatory Visit: Payer: Self-pay

## 2019-07-31 ENCOUNTER — Ambulatory Visit: Payer: Self-pay

## 2019-08-28 ENCOUNTER — Ambulatory Visit: Payer: No Typology Code available for payment source | Admitting: Advanced Practice Midwife

## 2019-08-28 ENCOUNTER — Other Ambulatory Visit: Payer: Self-pay

## 2019-08-28 ENCOUNTER — Ambulatory Visit
Admission: RE | Admit: 2019-08-28 | Discharge: 2019-08-28 | Disposition: A | Discharge status: Home / Self Care | Payer: No Typology Code available for payment source | Source: Ambulatory Visit | Attending: Obstetrics and Gynecology | Admitting: Obstetrics and Gynecology

## 2019-08-28 VITALS — BP 150/100 | Temp 97.1°F | Wt 183.0 lb

## 2019-08-28 DIAGNOSIS — N644 Mastodynia: Secondary | ICD-10-CM

## 2019-08-28 DIAGNOSIS — Z1239 Encounter for other screening for malignant neoplasm of breast: Secondary | ICD-10-CM

## 2019-08-28 NOTE — Progress Notes (Signed)
[name] is a 49 y.o. female who presents to [facility] clinic today with complaint of pain and ? Lump on right side. Pain on left side near areola x 1 week.    Pap Smear: Pap not smear completed today. Last Pap smear was 12/22/2016 at [facility] clinic and was normal. Per patient has no history of an abnormal Pap smear. Last Pap smear result is available in Epic.   Physical exam: Breasts Breasts symmetrical. No skin abnormalities bilateral breasts. No nipple retraction bilateral breasts. No nipple discharge bilateral breasts. No lymphadenopathy. No lumps palpated bilateral breasts.       Pelvic/Bimanual Pap is not indicated today    Smoking History: Patient has is a current smoker at 2-3 cigars per day packs per day Patient referred to quit line.    Patient Navigation: Patient education provided. Access to services provided for patient through [facility] program. No interpreter provided. No transportation provided   Colorectal Cancer Screening: Per patient has never had colonoscopy completed No complaints today.    Breast and Cervical Cancer Risk Assessment: Patient does not have family history of breast cancer, known genetic mutations, or radiation treatment to the chest before age 60. Patient does not have history of cervical dysplasia, immunocompromised, or DES exposure in-utero.  Risk Assessment    Risk Scores      08/28/2019   Last edited by: Demetrius Revel, LPN   5-year risk: 1.1 %   Lifetime risk: 8.7 %          A: B[facility]CP exam without pap smear Complaint of breast pain  P: Referred patient to the [facility] for a diagnostic mammogram. Appointment scheduled //16/2021 at 9:40am.  [place], CNM  08/28/19  8:50 AM

## 2019-09-09 ENCOUNTER — Other Ambulatory Visit: Payer: Self-pay

## 2019-09-09 ENCOUNTER — Encounter (HOSPITAL_COMMUNITY): Payer: Self-pay | Admitting: *Deleted

## 2019-09-09 ENCOUNTER — Emergency Department (HOSPITAL_COMMUNITY)
Admission: EM | Admit: 2019-09-09 | Discharge: 2019-09-10 | Disposition: A | Discharge status: Home / Self Care | Payer: Self-pay | Attending: Emergency Medicine | Admitting: Emergency Medicine

## 2019-09-09 ENCOUNTER — Emergency Department (HOSPITAL_COMMUNITY): Payer: Self-pay

## 2019-09-09 DIAGNOSIS — F1210 Cannabis abuse, uncomplicated: Secondary | ICD-10-CM | POA: Insufficient documentation

## 2019-09-09 DIAGNOSIS — M25559 Pain in unspecified hip: Secondary | ICD-10-CM

## 2019-09-09 DIAGNOSIS — I10 Essential (primary) hypertension: Secondary | ICD-10-CM | POA: Insufficient documentation

## 2019-09-09 DIAGNOSIS — Z7901 Long term (current) use of anticoagulants: Secondary | ICD-10-CM | POA: Insufficient documentation

## 2019-09-09 DIAGNOSIS — M545 Low back pain: Secondary | ICD-10-CM | POA: Insufficient documentation

## 2019-09-09 DIAGNOSIS — M25551 Pain in right hip: Secondary | ICD-10-CM | POA: Insufficient documentation

## 2019-09-09 DIAGNOSIS — M79604 Pain in right leg: Secondary | ICD-10-CM | POA: Insufficient documentation

## 2019-09-09 DIAGNOSIS — Z79899 Other long term (current) drug therapy: Secondary | ICD-10-CM | POA: Insufficient documentation

## 2019-09-09 DIAGNOSIS — F17210 Nicotine dependence, cigarettes, uncomplicated: Secondary | ICD-10-CM | POA: Insufficient documentation

## 2019-09-09 MED ORDER — ONDANSETRON HCL 4 MG/2ML IJ SOLN
4.0000 mg | Freq: Once | INTRAMUSCULAR | Status: AC
  Administered 2019-09-10: 4 mg via INTRAVENOUS
  Filled 2019-09-09: qty 2

## 2019-09-09 MED ORDER — MORPHINE SULFATE (PF) 4 MG/ML IV SOLN
4.0000 mg | Freq: Once | INTRAVENOUS | Status: AC
  Administered 2019-09-09: 4 mg via INTRAVENOUS
  Filled 2019-09-09: qty 1

## 2019-09-09 MED ORDER — FENTANYL CITRATE (PF) 100 MCG/2ML IJ SOLN
50.0000 ug | Freq: Once | INTRAMUSCULAR | Status: AC
  Administered 2019-09-10: 50 ug via INTRAVENOUS
  Filled 2019-09-09: qty 2

## 2019-09-09 NOTE — ED Triage Notes (Signed)
The pt fell yesterday and has had pain in her rt hip and the palm of her rt hand sincethen  She has on a pain patch and has taken other meds that have not helped the pain  lmp now

## 2019-09-09 NOTE — ED Provider Notes (Signed)
[facility] EMERGENCY DEPARTMENT Provider Note   CSN: [id number] Arrival date & time: 09/09/19  1907     History Chief Complaint  Patient presents with  . Hip Pain    Tina Moss is a 49 y.o. female past medical [place] anemia, epilepsy, fibroids, hyperlipidemia, hypertension who presents for evaluation of right hip, right leg pain after mechanical fall that occurred yesterday. Patient reports that she was with her grandchildren and was playing with them and states that she twisted and fell landing on her hip.  No head injury, LOC.  She also reports landing on her right hand.  Since then, she has had pain to her right hip, right leg and lower back.  She reports she took Tylenol 3 and muscle relaxer with no improvement in symptoms.  She states that she has been able to ambulate and bear weight but with significant pain.  Pain is also worsened by movement.  She has not had any numbness/weakness of the leg.  The history is provided by the patient.       Past Medical History:  Diagnosis Date  . Anemia   . Epilepsy ([place])   . Fibroids   . H/O bacterial infection   . H/O mumps   . Hyperlipidemia   . Hypertension   . Seizures [facility])     Patient Active Problem List   Diagnosis Date Noted  . Abnormal uterine bleeding (AUB) 02/13/2019  . Expressive aphasia   . 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 Acute CVA (cerebrovascular accident) ([place]) 04/04/2018  . Tobacco use 04/04/2018  . Heart palpitations 12/14/2016  . Intermittent chest pain 12/14/2016  . LVH (left ventricular hypertrophy) 12/14/2016  . Depression 11/15/2016  . Essential hypertension 11/15/2016  . Fibroids 01/18/2012  . Menorrhagia 01/18/2012  . Seizure disorder ([place]) 01/18/2012  . Anemia 01/18/2012    Past Surgical History:  Procedure Laterality Date  . CESAREAN SECTION     x2. at term  . TEE WITHOUT CARDIOVERSION N/A 04/06/2018   Procedure: TRANSESOPHAGEAL ECHOCARDIOGRAM  (TEE) BUBBLE STUDY;  Surgeon: Lelon Perla, MD;  Location: [facility] ENDOSCOPY;  Service: Cardiovascular;  Laterality: N/A;  . TUBAL LIGATION    . WISDOM TOOTH EXTRACTION       OB History    Gravida  3   Para  2   Term  2   Preterm      AB  1   Living  2     SAB  1   TAB      Ectopic      Multiple      Live Births  2        Obstetric Comments  Term c/s x 2.         Family History  Problem Relation Age of Onset  . Hypertension Mother   . Hypertension Sister   . Arthritis Sister        knees  . Hypertension Brother   . Deafness Brother   . Speech disorder Brother        mute  . Mental illness Brother        "he can just fly off"  . Mental illness Daughter        ? bipolar  . Scoliosis Son     Social History   Tobacco Use  . Smoking status: Current Some Day Smoker    Types: Cigars  . Smokeless tobacco: Never Used  . Tobacco comment: "I think  I just keep so much on my mind."  Substance Use Topics  . Alcohol use: Yes    Alcohol/week: 0.0 - 3.0 standard drinks    Comment: social  . Drug use: Not Currently    Types: Marijuana    Comment: occasionally    Home Medications Prior to Admission medications   Medication Sig Start Date End Date Taking? Authorizing Provider  atorvastatin (LIPITOR) 20 MG tablet Take 1 tablet (20 mg total) by mouth daily at 6 PM. 01/05/19   Charlott Rakes, MD  clopidogrel (PLAVIX) 75 MG tablet Take 1 tablet (75 mg total) by mouth daily. Patient not taking: Reported on 04/06/2019 01/05/19   Charlott Rakes, MD  diclofenac sodium (VOLTAREN) 1 % GEL Apply 4 g topically 4 (four) times daily. 03/23/19   Charlott Rakes, MD  famotidine (PEPCID) 10 MG tablet Take 1 tablet (10 mg total) by mouth 2 (two) times daily. 01/05/19   Charlott Rakes, MD  ferrous sulfate 325 (65 FE) MG tablet Take 1 tablet (325 mg total) by mouth 2 (two) times daily with a meal. 01/19/19   Fulp, Cammie, MD  furosemide (LASIX) 20 MG tablet Take 1 tablet (20 mg  total) by mouth daily. 05/18/18   Charlott Rakes, MD  gabapentin (NEURONTIN) 300 MG capsule Take 1 capsule (300 mg total) by mouth 2 (two) times daily. 01/05/19   Charlott Rakes, MD  HYDROcodone-acetaminophen (NORCO) 5-325 MG tablet Take 1 tablet by mouth every 6 (six) hours as needed. 09/10/19   Jacqlyn Larsen, PA-C  medroxyPROGESTERone (PROVERA) 10 MG tablet Take 1 tablet (10 mg total) by mouth 2 (two) times daily. 02/13/19   Aletha Halim, MD  metoprolol tartrate (LOPRESSOR) 50 MG tablet Take 1 tablet (50 mg total) by mouth 2 (two) times daily. 01/05/19   Charlott Rakes, MD  predniSONE (DELTASONE) 20 MG tablet 3 pills on first day then 2 pills x 2 days, 1 pill x 2 days then 1/2 pill x 4 days; take after eating Patient not taking: Reported on 02/28/2019 01/19/19   Fulp, Ander Gaster, MD  Vitamin D, Ergocalciferol, (DRISDOL) 1.25 MG (50000 UT) CAPS capsule Take 1 capsule (50,000 Units total) by mouth every 7 (seven) days. Patient not taking: Reported on 02/28/2019 08/10/18   Argentina Donovan, PA-C    Allergies    Patient has no known allergies.  Review of Systems   Review of Systems  Musculoskeletal: Positive for back pain.       Hip pain Right hand pain   Neurological: Negative for weakness and numbness.  All other systems reviewed and are negative.   Physical Exam Updated Vital Signs BP (!) 168/93   Pulse 95   Temp 98.1 F (36.7 C) (Oral)   Resp 16   Ht 5\' 7"  (1.702 m)   Wt 83 kg   LMP 09/09/2019   SpO2 94%   BMI 28.66 kg/m   Physical Exam Vitals and nursing note reviewed.  Constitutional:      Appearance: She is well-developed.  HENT:     Head: Normocephalic and atraumatic.  Eyes:     General: No scleral icterus.       Right eye: No discharge.        Left eye: No discharge.     Conjunctiva/sclera: Conjunctivae normal.  Neck:     Comments: No midline C-spine tenderness. Cardiovascular:     Pulses:          Radial pulses are 2+ on the right side and 2+ on  the left side.         Dorsalis pedis pulses are 2+ on the right side and 2+ on the left side.  Pulmonary:     Effort: Pulmonary effort is normal.  Musculoskeletal:       Legs:     Comments: No midline T spine tenderness.  Denies palpation noted to the midline lumbar and sacral spine area.  No deformity or crepitus noted.  Tenderness overlying the right hip that goes into the right femur.  No deformity or crepitus noted.  Limited flexion secondary to pain.  Extension intact.  Unable to assess internal and external rotation.  No bony tenderness noted to right knee, right tib-fib, right ankle.  No tenderness palpation noted to the left lower extremity.  Full range of motion of left lower extremity intact light difficulty.  Tenderness palpation noted to the palmar aspect of her right hand at the thenar eminence with overlying soft tissue swelling.  Skin:    General: Skin is warm and dry.  Neurological:     Mental Status: She is alert.     Comments: Follows commands, Moves all extremities  5/5 strength to BUE and BLE  Sensation intact throughout all major nerve distributions  Psychiatric:        Speech: Speech normal.        Behavior: Behavior normal.     ED Results / Procedures / Treatments   Labs (all labs ordered are listed, but only abnormal results are displayed) Labs Reviewed - No data to display  EKG None  Radiology DG Lumbar Spine Complete  Result Date: 09/09/2019 CLINICAL DATA:  Initial evaluation for acute trauma, fall. EXAM: LUMBAR SPINE - COMPLETE 4+ VIEW COMPARISON:  None. FINDINGS: Vertebral bodies normally aligned with preservation of the normal lumbar lordosis. Vertebral body height maintained without evidence for acute or chronic fracture. Visualized sacrum and pelvis intact. Disc space height maintained. No visible soft tissue abnormality. IMPRESSION: No radiographic evidence for acute traumatic injury within the lumbar spine. Electronically Signed   By: Jeannine Boga M.D.   On:  09/09/2019 22:44   DG Sacrum/Coccyx  Result Date: 09/09/2019 CLINICAL DATA:  [place], pain EXAM: SACRUM AND COCCYX - 2+ VIEW COMPARISON:  05/25/2017 FINDINGS: Frontal and lateral views of the sacrum and coccyx are obtained. No acute displaced fracture. Sacroiliac joints are unremarkable. Incidental osteitis pubis. Remainder of the bony pelvis is unremarkable. IMPRESSION: 1. Unremarkable sacrum and coccyx. Electronically Signed   By: Randa Ngo M.D.   On: 09/09/2019 22:39   DG Knee Complete 4 Views Right  Result Date: 09/09/2019 CLINICAL DATA:  Right knee pain EXAM: RIGHT KNEE - COMPLETE 4+ VIEW COMPARISON:  11/17/2015 FINDINGS: Frontal, bilateral oblique, lateral views of the right knee are obtained. There is mild medial compartmental joint space narrowing and osteophyte formation. No fracture, subluxation, or dislocation. No joint effusion. IMPRESSION: 1. Mild right knee osteoarthritis.  No acute bony abnormality. Electronically Signed   By: Randa Ngo M.D.   On: 09/09/2019 23:45   DG Hand Complete Right  Result Date: 09/09/2019 CLINICAL DATA:  [place] yesterday, right hand pain EXAM: RIGHT HAND - COMPLETE 3+ VIEW COMPARISON:  None. FINDINGS: Frontal, oblique, lateral views of the right hand are obtained. No fracture, subluxation, or dislocation. Mild distal interphalangeal joint space narrowing and osteophyte formation consistent with osteoarthritis. Soft tissues are unremarkable. IMPRESSION: 1. Mild osteoarthritis.  No acute fracture. Electronically Signed   By: Randa Ngo M.D.   On: 09/09/2019 22:40  DG Hip Unilat  With Pelvis 2-3 Views Right  Result Date: 09/09/2019 CLINICAL DATA:  [place], right hip pain EXAM: DG HIP (WITH OR WITHOUT PELVIS) 2-3V RIGHT COMPARISON:  None. FINDINGS: Frontal view of the pelvis as well as frontal and frogleg lateral views of the right hip are obtained. No fracture, subluxation, or dislocation. Mild symmetrical hip osteoarthritis. Hypertrophic changes of the  pubic symphysis compatible with osteitis pubis. The remainder of the bony pelvis is unremarkable. IMPRESSION: 1. No acute displaced fracture. 2. Mild symmetrical bilateral hip osteoarthritis. Electronically Signed   By: Randa Ngo M.D.   On: 09/09/2019 22:41   DG Femur Min 2 Views Left  Result Date: 09/09/2019 CLINICAL DATA:  Initial evaluation for acute trauma, fall. Leg patent. EXAM: LEFT FEMUR 2 VIEWS COMPARISON:  None. FINDINGS: No acute fracture dislocation. Limited views of the hip and knee demonstrate no abnormality. Linear sclerotic lesion at the distal left femoral shaft suspected reflect a growth line. No discrete osseous lesions. No soft tissue abnormality. IMPRESSION: No acute osseous abnormality about the femur. Electronically Signed   By: Jeannine Boga M.D.   On: 09/09/2019 22:42    Procedures Procedures (including critical care time)  Medications Ordered in ED Medications  morphine 4 MG/ML injection 4 mg (4 mg Intravenous Given 09/09/19 2151)  fentaNYL (SUBLIMAZE) injection 50 mcg (50 mcg Intravenous Given 09/10/19 0004)  ondansetron (ZOFRAN) injection 4 mg (4 mg Intravenous Given 09/10/19 0004)    ED Course  I have reviewed the triage vital signs and the nursing notes.  Pertinent labs & imaging results that were available during my care of the patient were reviewed by me and considered in my medical decision making (see chart for details).    MDM Rules/Calculators/A&P                      49 year old female who presents for evaluation of right hip and leg pain that began yesterday after mechanical fall.  Reports that she was playing with grandchild and she twisted and landed on her right hip.  No head injury, LOC.  She reports pain to the right hip and upper right leg.  She states she has been able to ambulate and bear weight but it does hurt more so with doing so.  She took muscle relaxers and Tylenol 3 with no improvement in symptoms.  No numbness/weakness.  She  does report occasionally, she feels like the pain radiates down to her knee.  On initial arrival, she is afebrile, nontoxic-appearing.  Vital signs are stable.  She is neurovascularly intact.  She does have some limited range of motion.  Question if this is muscle related versus fracture.  History/physical exam concerning for ischemic limb.  Doubt dislocation.  X-rays reviewed.  Her x-rays of her hip, femur are negative.  Her x-rays of her back and her sacrum are negative for any acute bony abnormality.  X-ray of her knee negative for any acute abnormality.  Discussed results with patient.  She reports some minor improvement in pain after analgesics here in the ED.  She does still feel like she is having a lot of pain.  I was able to ambulate her in the room and she was able to ambulate and bear weight on the leg and take a few steps.  At this time, given that she has been ambulating on it since the incident, do not suspect occult fracture.  No indication for further imaging.  Will give short course  of pain medication for acute pain.  Instructed patient to follow-up with orthopedics. At this time, patient exhibits no emergent life-threatening condition that require further evaluation in ED or admission. Patient had ample opportunity for questions and discussion. All patient's questions were answered with full understanding. Strict return precautions discussed. Patient expresses understanding and agreement to plan.   Portions of this note were generated with [employer]. Dictation errors may occur despite best attempts at proofreading.   Final Clinical Impression(s) / ED Diagnoses Final diagnoses:  Hip pain    Rx / DC Orders ED Discharge Orders         Ordered    HYDROcodone-acetaminophen (NORCO/VICODIN) 5-325 MG tablet  Every 6 hours PRN,   Status:  Discontinued     09/10/19 0016           Volanda Napoleon, PA-C 09/10/19 2247    Virgel Manifold, MD 09/11/19 [phone]

## 2019-09-10 ENCOUNTER — Telehealth: Payer: Self-pay | Admitting: Emergency Medicine

## 2019-09-10 ENCOUNTER — Telehealth: Payer: Self-pay

## 2019-09-10 ENCOUNTER — Telehealth (HOSPITAL_COMMUNITY): Payer: Self-pay | Admitting: Student

## 2019-09-10 MED ORDER — HYDROCODONE-ACETAMINOPHEN 5-325 MG PO TABS: 1.0000 | ORAL_TABLET | Freq: Four times a day (QID) | ORAL | 0 refills | Status: DC | PRN

## 2019-09-10 NOTE — ED Notes (Signed)
Patient verbalizes understanding of discharge instructions. Opportunity for questioning and answers were provided. Armband removed by staff, pt discharged from ED. Pt. ambulatory and discharged home.  

## 2019-09-10 NOTE — Telephone Encounter (Signed)
Please send [place] to another pharmacy-we do not dispense narcotics at the [facility] and [facility]

## 2019-09-10 NOTE — Telephone Encounter (Signed)
Tina Moss is a 49 y.o. female was seen in the ED yesterday for hip and back pain, she was prescribed 6 tablets of Norco, but [facility] not fill narcotic pain prescriptions.  Prescription sent to pharmacy requested by patient.

## 2019-09-10 NOTE — Telephone Encounter (Signed)
Received a call from Tina Moss requesting assistance with her Norco prescription from the 09/09/2019 ED encounter.  CM advised that the medication was electronically sent and signed to [facility], which pt advised does not dispense this medication.  CM advised the pt speak with the [facility] pharmacy and have the medication transferred to her other preferred pharmacy.  Pt had no further questions or needs at this time.

## 2019-09-10 NOTE — Discharge Instructions (Signed)
You can take Tylenol or Ibuprofen as directed for pain. You can alternate Tylenol and Ibuprofen every 4 hours. If you take Tylenol at 1pm, then you can take Ibuprofen at 5pm. Then you can take Tylenol again at 9pm.   Take pain medications as directed for break through pain. Do not drive or operate machinery while taking this medication.   Follow the RICE (Rest, Ice, Compression, Elevation) protocol as directed.   Follow-up with referred orthopedic doctor for further evaluation.  Return the emergency department for any worsening pain, difficulty walking, numbness/weakness or any other worsening concerning symptoms.

## 2019-09-10 NOTE — Telephone Encounter (Signed)
Ms. Loges called again advising the [facility] pharmacy could not assist her.  CM contacted the [facility] pharmacy who advised they could not transfer the prescription to another pharmacy due to it being a narcotic.  CM spoke with Marijean Bravo, [place] who sent prescription to [facility] on [address].  CM contacted pt to advise that prescription was sent to requested pharmacy.  No further CM needs noted at this time.

## 2019-09-14 ENCOUNTER — Other Ambulatory Visit: Payer: Self-pay

## 2019-09-14 ENCOUNTER — Encounter (HOSPITAL_COMMUNITY): Payer: Self-pay | Admitting: Emergency Medicine

## 2019-09-14 ENCOUNTER — Emergency Department (HOSPITAL_COMMUNITY)
Admission: EM | Admit: 2019-09-14 | Discharge: 2019-09-14 | Disposition: A | Discharge status: Home / Self Care | Payer: Self-pay | Attending: Emergency Medicine | Admitting: Emergency Medicine

## 2019-09-14 ENCOUNTER — Emergency Department (HOSPITAL_COMMUNITY): Payer: Self-pay

## 2019-09-14 DIAGNOSIS — F17290 Nicotine dependence, other tobacco product, uncomplicated: Secondary | ICD-10-CM | POA: Insufficient documentation

## 2019-09-14 DIAGNOSIS — M25551 Pain in right hip: Secondary | ICD-10-CM | POA: Insufficient documentation

## 2019-09-14 DIAGNOSIS — Z7902 Long term (current) use of antithrombotics/antiplatelets: Secondary | ICD-10-CM | POA: Insufficient documentation

## 2019-09-14 DIAGNOSIS — I10 Essential (primary) hypertension: Secondary | ICD-10-CM | POA: Insufficient documentation

## 2019-09-14 DIAGNOSIS — W19XXXD Unspecified fall, subsequent encounter: Secondary | ICD-10-CM | POA: Insufficient documentation

## 2019-09-14 DIAGNOSIS — Z79899 Other long term (current) drug therapy: Secondary | ICD-10-CM | POA: Insufficient documentation

## 2019-09-14 MED ORDER — OXYCODONE-ACETAMINOPHEN 5-325 MG PO TABS
2.0000 | ORAL_TABLET | Freq: Once | ORAL | Status: AC
  Administered 2019-09-14: 12:00:00 2 via ORAL
  Filled 2019-09-14: qty 2

## 2019-09-14 MED ORDER — DICLOFENAC SODIUM 25 MG PO TBEC: 25.0000 mg | DELAYED_RELEASE_TABLET | Freq: Two times a day (BID) | ORAL | 0 refills | Status: DC

## 2019-09-14 MED ORDER — CYCLOBENZAPRINE HCL 10 MG PO TABS: 10.0000 mg | ORAL_TABLET | Freq: Two times a day (BID) | ORAL | 0 refills | Status: DC | PRN

## 2019-09-14 MED ORDER — ONDANSETRON HCL 4 MG PO TABS
4.0000 mg | ORAL_TABLET | Freq: Once | ORAL | Status: AC
  Administered 2019-09-14: 12:00:00 4 mg via ORAL
  Filled 2019-09-14: qty 1

## 2019-09-14 MED ORDER — HYDROCODONE-ACETAMINOPHEN 5-325 MG PO TABS: 2.0000 | ORAL_TABLET | ORAL | 0 refills | Status: DC | PRN

## 2019-09-14 MED ORDER — LIDOCAINE 5 % EX PTCH
1.0000 | MEDICATED_PATCH | Freq: Once | CUTANEOUS | Status: DC
  Administered 2019-09-14: 15:00:00 1 via TRANSDERMAL
  Filled 2019-09-14: qty 1

## 2019-09-14 NOTE — ED Provider Notes (Signed)
[facility] EMERGENCY DEPARTMENT Provider Note   CSN: [id number] Arrival date & time: 09/14/19  1007     History Chief Complaint  Patient presents with  . Leg Pain    Tina Moss is a 49 y.o. female past medical history significant for anemia, hypertension, seizures presents to emergency department today with chief complaint of progressively worsening right hip and leg pain x 5 days. Patient was seen in the emergency department on 09/09/2019 after mechanical fall.  She had x-rays of her hip, femur, back, sacrum, knee which were all negative for acute abnormalities.  She has not a follow-up appointment scheduled with orthopedics on 09/24/2019 however the pain has become so severe and she is having difficulty walking she came to the emergency department for evaluation.  She is describing the pain as sharp, burning, throbbing.  The pain is constant.  It starts in her right hip and radiates down her right leg.  She can bear minimal weight on her right foot only.  She denies any further injury, trauma or fall.  She rates the pain 10 of 10 in severity.  She took the Norco without any pain improvement.  Her sister also had a muscle relaxer that she tried taking Flexeril however had no pain improvement also.  She denies any fever, chills, numbness or weakness of right leg.  History provided by patient with additional history obtained from chart review.      Past Medical History:  Diagnosis Date  . Anemia   . Epilepsy ([place])   . Fibroids   . H/O bacterial infection   . H/O mumps   . Hyperlipidemia   . Hypertension   . Seizures [facility])     Patient Active Problem List   Diagnosis Date Noted  . Abnormal uterine bleeding (AUB) 02/13/2019  . Expressive aphasia   . 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 Acute CVA (cerebrovascular accident) ([place]) 04/04/2018  . Tobacco use 04/04/2018  . Heart palpitations 12/14/2016  . Intermittent chest pain 12/14/2016  .  LVH (left ventricular hypertrophy) 12/14/2016  . Depression 11/15/2016  . Essential hypertension 11/15/2016  . Fibroids 01/18/2012  . Menorrhagia 01/18/2012  . Seizure disorder ([place]) 01/18/2012  . Anemia 01/18/2012    Past Surgical History:  Procedure Laterality Date  . CESAREAN SECTION     x2. at term  . TEE WITHOUT CARDIOVERSION N/A 04/06/2018   Procedure: TRANSESOPHAGEAL ECHOCARDIOGRAM (TEE) BUBBLE STUDY;  Surgeon: Lelon Perla, MD;  Location: [facility] ENDOSCOPY;  Service: Cardiovascular;  Laterality: N/A;  . TUBAL LIGATION    . WISDOM TOOTH EXTRACTION       OB History    Gravida  3   Para  2   Term  2   Preterm      AB  1   Living  2     SAB  1   TAB      Ectopic      Multiple      Live Births  2        Obstetric Comments  Term c/s x 2.         Family History  Problem Relation Age of Onset  . Hypertension Mother   . Hypertension Sister   . Arthritis Sister        knees  . Hypertension Brother   . Deafness Brother   . Speech disorder Brother        mute  . Mental illness Brother        "  he can just fly off"  . Mental illness Daughter        ? bipolar  . Scoliosis Son     Social History   Tobacco Use  . Smoking status: Current Some Day Smoker    Types: Cigars  . Smokeless tobacco: Never Used  . Tobacco comment: "I think I just keep so much on my mind."  Substance Use Topics  . Alcohol use: Yes    Alcohol/week: 0.0 - 3.0 standard drinks    Comment: social  . Drug use: Not Currently    Types: Marijuana    Comment: occasionally    Home Medications Prior to Admission medications   Medication Sig Start Date End Date Taking? Authorizing Provider  atorvastatin (LIPITOR) 20 MG tablet Take 1 tablet (20 mg total) by mouth daily at 6 PM. 01/05/19   Charlott Rakes, MD  clopidogrel (PLAVIX) 75 MG tablet Take 1 tablet (75 mg total) by mouth daily. Patient not taking: Reported on 04/06/2019 01/05/19   Charlott Rakes, MD  cyclobenzaprine  (FLEXERIL) 10 MG tablet Take 1 tablet (10 mg total) by mouth 2 (two) times daily as needed for muscle spasms. 09/14/19   ,  E, PA-C  diclofenac (VOLTAREN) 25 MG EC tablet Take 1 tablet (25 mg total) by mouth 2 (two) times daily for 5 days. 09/14/19 09/19/19  ,  E, PA-C  diclofenac sodium (VOLTAREN) 1 % GEL Apply 4 g topically 4 (four) times daily. 03/23/19   Charlott Rakes, MD  famotidine (PEPCID) 10 MG tablet Take 1 tablet (10 mg total) by mouth 2 (two) times daily. 01/05/19   Charlott Rakes, MD  ferrous sulfate 325 (65 FE) MG tablet Take 1 tablet (325 mg total) by mouth 2 (two) times daily with a meal. 01/19/19   Fulp, Cammie, MD  furosemide (LASIX) 20 MG tablet Take 1 tablet (20 mg total) by mouth daily. 05/18/18   Charlott Rakes, MD  gabapentin (NEURONTIN) 300 MG capsule Take 1 capsule (300 mg total) by mouth 2 (two) times daily. 01/05/19   Charlott Rakes, MD  HYDROcodone-acetaminophen (NORCO/VICODIN) 5-325 MG tablet Take 2 tablets by mouth every 4 (four) hours as needed for severe pain. 09/14/19   ,  E, PA-C  medroxyPROGESTERone (PROVERA) 10 MG tablet Take 1 tablet (10 mg total) by mouth 2 (two) times daily. 02/13/19   Aletha Halim, MD  metoprolol tartrate (LOPRESSOR) 50 MG tablet Take 1 tablet (50 mg total) by mouth 2 (two) times daily. 01/05/19   Charlott Rakes, MD  predniSONE (DELTASONE) 20 MG tablet 3 pills on first day then 2 pills x 2 days, 1 pill x 2 days then 1/2 pill x 4 days; take after eating Patient not taking: Reported on 02/28/2019 01/19/19   Fulp, Ander Gaster, MD  Vitamin D, Ergocalciferol, (DRISDOL) 1.25 MG (50000 UT) CAPS capsule Take 1 capsule (50,000 Units total) by mouth every 7 (seven) days. Patient not taking: Reported on 02/28/2019 08/10/18   Argentina Donovan, PA-C    Allergies    Patient has no known allergies.  Review of Systems   Review of Systems  All other systems are reviewed and are negative for acute change except as noted in the  HPI.   Physical Exam Updated Vital Signs BP (!) 152/112   Pulse (!) 125   Temp 98.6 F (37 C) (Oral)   Resp (!) 22   Ht 5\' 7"  (1.702 m)   Wt 83 kg   LMP 09/09/2019   SpO2 100%  BMI 28.66 kg/m   Physical Exam Vitals and nursing note reviewed.  Constitutional:      Appearance: She is well-developed. She is not ill-appearing or toxic-appearing.  HENT:     Head: Normocephalic and atraumatic.     Nose: Nose normal.  Eyes:     General: No scleral icterus.       Right eye: No discharge.        Left eye: No discharge.     Conjunctiva/sclera: Conjunctivae normal.  Neck:     Vascular: No JVD.  Cardiovascular:     Rate and Rhythm: Regular rhythm. Tachycardia present.     Pulses: Normal pulses.     Heart sounds: Normal heart sounds.  Pulmonary:     Effort: Pulmonary effort is normal.     Breath sounds: Normal breath sounds.  Abdominal:     General: There is no distension.  Musculoskeletal:        General: Normal range of motion.     Cervical back: Normal range of motion.     Right lower leg: No edema.     Left lower leg: No edema.       Legs:     Comments: Tenderness to palpation as depicted in image above.   No deformity or crepitus noted.  Flexion limited 2/2 to pain.  Extension, internal and external rotation intact. No overlying skin changes. No midline thoracic or lumbar spine tenderness.   No deformity or crepitus noted. Unable to assess internal and external rotation.  No bony tenderness noted to right knee, right tib-fib, right ankle. Neurovascularly intact distally. Compartments soft above and below affected joint.   5/5 strength to BLE  Sensation intact throughout all major nerve distributions  Ambulates with mildly antalgic gait.   Skin:    General: Skin is warm and dry.  Neurological:     Mental Status: She is oriented to person, place, and time.     GCS: GCS eye subscore is 4. GCS verbal subscore is 5. GCS motor subscore is 6.     Comments: Fluent speech,  no facial droop.  Psychiatric:        Mood and Affect: Mood is anxious. Affect is tearful.        Behavior: Behavior normal.     ED Results / Procedures / Treatments   Labs (all labs ordered are listed, but only abnormal results are displayed) Labs Reviewed - No data to display  EKG None  Radiology CT EXTREMITY LOWER RIGHT WO CONTRAST  Result Date: 09/14/2019 CLINICAL DATA:  Patient fell 5 days ago. Persistent thigh pain. EXAM: CT OF THE LOWER RIGHT EXTREMITY WITHOUT CONTRAST TECHNIQUE: Multidetector CT imaging of the right thigh was performed according to the standard protocol. COMPARISON:  Radiographs 09/09/2019. FINDINGS: Bones/Joint/Cartilage No evidence of acute right femur fracture or dislocation. There is no significant arthropathy at the right hip. No evidence of femoral head avascular necrosis. Moderate osteitis pubis and mild degenerative changes at the right sacroiliac joint. Tricompartmental degenerative changes are present at the right knee with joint space narrowing and subchondral cyst formation anteriorly in the lateral femoral condyle. No significant knee joint effusion. Ligaments Suboptimally assessed by CT. The cruciate ligaments appear intact at the knee. Muscles and Tendons Unremarkable. The extensor mechanism is intact at the knee. Soft tissues There is a Baker's cyst at the right knee measuring up to 3.7 cm in diameter. The soft tissues otherwise appear normal. Specifically, no evidence of right thigh hematoma or foreign body.  IMPRESSION: 1. No evidence of acute right femur fracture or dislocation. 2. 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 Right knee Baker's cyst. 3. Osteitis pubis. Electronically Signed   By: Richardean Sale M.D.   On: 09/14/2019 13:01    Procedures Procedures (including critical care time)  Medications Ordered in ED Medications  lidocaine (LIDODERM) 5 % 1 patch (1 patch Transdermal Patch Applied 09/14/19 1431)   oxyCODONE-acetaminophen (PERCOCET/ROXICET) 5-325 MG per tablet 2 tablet (2 tablets Oral Given 09/14/19 1145)  ondansetron (ZOFRAN) tablet 4 mg (4 mg Oral Given 09/14/19 1145)    ED Course  I have reviewed the triage vital signs and the nursing notes.  Pertinent labs & imaging results that were available during my care of the patient were reviewed by me and considered in my medical decision making (see chart for details).    MDM Rules/Calculators/A&P                      Patient seen and examined. Patient presents awake, alert, hemodynamically stable, afebrile, non toxic.  Patient was noted to be tachycardic to 125 in triage.  On my exam she is very anxious and tearful.  Suspect tachycardia is related, will closely monitor.  She has tenderness to palpation of right hip without any overlying skin changes.  She has decreased range of motion secondary to pain however is neurovascularly intact.  Strength is 5 out of 5 in bilateral lower extremities.  She ambulates with mildly antalgic gait.  I reviewed her images from initial ED visit after this fall.  X-rays were all negative.  Given her severe and persistent pain despite p.o. medications at home will CT right lower extremity to look for fracture. CT is negative for any fracture or dislocation.  She is noted to have tricompartmental degenerative changes in her right knee.  She does have a Bakers cyst noted on the right knee, this is not where her pain is on exam.  Patient given p.o. Percocet and on reassessment is resting comfortably.  I updated her on CT findings.  She is able to ambulate better after pain controlled.  She already has an appointment scheduled with orthopedics for 09/24/2019, recommend she keep that appointment as scheduled.  Will attempt symptom control.  Will discharge home with prescription for Flexeril, diclofenac and short course of Norco. I have reviewed the PDMP during this encounter.  Patient had the recent course of Norco that has  been completed however no longstanding or chronic pain prescriptions.  Tachycardia improved prior to discharge. The patient appears reasonably screened and/or stabilized for discharge and I doubt any other medical condition or other [facility] requiring further screening, evaluation, or treatment in the ED at this time prior to discharge. The patient is safe for discharge with strict return precautions discussed.    Portions of this note were generated with [employer]. Dictation errors may occur despite best attempts at proofreading.   Final Clinical Impression(s) / ED Diagnoses Final diagnoses:  Right hip pain    Rx / DC Orders ED Discharge Orders         Ordered    cyclobenzaprine (FLEXERIL) 10 MG tablet  2 times daily PRN     09/14/19 1424    HYDROcodone-acetaminophen (NORCO/VICODIN) 5-325 MG tablet  Every 4 hours PRN     09/14/19 1424    diclofenac (VOLTAREN) 25 MG EC tablet  2 times daily     09/14/19 1424  Cherre Robins, PA-C 09/14/19 1452    Malvin Johns, MD 09/15/19 [phone]

## 2019-09-14 NOTE — Discharge Instructions (Addendum)
You have been seen today for right hip pain. Please read and follow all provided instructions. Return to the emergency room for worsening condition or new concerning symptoms.    1. Medications:  Prescription sent to your pharmacy for Flexeril.  This a muscle relaxer.  Please take as prescribed.  Make you drowsy so do not drive or work when taking. -Prescription also sent for Norco.  This is a strong pain medicine.  This has Tylenol in it so do not take any additional Tylenol.  This is for severe pain only.  Take with food so it does not cause upset stomach. -Prescription also sent for diclofenac.  This is used to treat arthritis.  Please take as prescribed.  Continue usual home medications Take medications as prescribed. Please review all of the medicines and only take them if you do not have an allergy to them.   2. Treatment: rest, drink plenty of fluids  3. Follow Up:  Please follow up with orthopedics at your upcoming scheduled appointment.   It is also a possibility that you have an allergic reaction to any of the medicines that you have been prescribed - Everybody reacts differently to medications and while MOST people have no trouble with most medicines, you may have a reaction such as nausea, vomiting, rash, swelling, shortness of breath. If this is the case, please stop taking the medicine immediately and contact your physician.  ?

## 2019-09-14 NOTE — ED Triage Notes (Signed)
Pt reports that she had a fall onto her R hip and was seen here on the 28th, states that she was given pain medications but they are not working. Continues to have pain to her R thigh. Supposed to follow up with ortho on 4/12.

## 2019-09-14 NOTE — ED Notes (Signed)
Taken to CT.

## 2019-09-17 ENCOUNTER — Other Ambulatory Visit: Payer: Self-pay

## 2019-09-17 ENCOUNTER — Ambulatory Visit: Payer: Self-pay | Attending: Critical Care Medicine | Admitting: Critical Care Medicine

## 2019-09-17 ENCOUNTER — Encounter: Payer: Self-pay | Admitting: Critical Care Medicine

## 2019-09-17 VITALS — BP 123/85 | HR 100 | Resp 16

## 2019-09-17 DIAGNOSIS — Z720 Tobacco use: Secondary | ICD-10-CM

## 2019-09-17 DIAGNOSIS — I10 Essential (primary) hypertension: Secondary | ICD-10-CM

## 2019-09-17 DIAGNOSIS — I63512 Cerebral infarction due to unspecified occlusion or stenosis of left middle cerebral artery: Secondary | ICD-10-CM

## 2019-09-17 DIAGNOSIS — N939 Abnormal uterine and vaginal bleeding, unspecified: Secondary | ICD-10-CM

## 2019-09-17 DIAGNOSIS — N938 Other specified abnormal uterine and vaginal bleeding: Secondary | ICD-10-CM

## 2019-09-17 DIAGNOSIS — Z8673 Personal history of transient ischemic attack (TIA), and cerebral infarction without residual deficits: Secondary | ICD-10-CM

## 2019-09-17 DIAGNOSIS — D500 Iron deficiency anemia secondary to blood loss (chronic): Secondary | ICD-10-CM

## 2019-09-17 DIAGNOSIS — M25561 Pain in right knee: Secondary | ICD-10-CM | POA: Insufficient documentation

## 2019-09-17 DIAGNOSIS — M25551 Pain in right hip: Secondary | ICD-10-CM

## 2019-09-17 MED ORDER — MELOXICAM 15 MG PO TABS: 15.0000 mg | ORAL_TABLET | Freq: Every day | ORAL | 0 refills | Status: DC

## 2019-09-17 MED ORDER — ATORVASTATIN CALCIUM 20 MG PO TABS: 20.0000 mg | ORAL_TABLET | Freq: Every day | ORAL | 6 refills | Status: DC

## 2019-09-17 MED ORDER — FERROUS SULFATE 325 (65 FE) MG PO TABS: 325.0000 mg | ORAL_TABLET | Freq: Two times a day (BID) | ORAL | 3 refills | Status: DC

## 2019-09-17 MED ORDER — OXYCODONE HCL 10 MG PO TABS: 10.0000 mg | ORAL_TABLET | Freq: Four times a day (QID) | ORAL | 0 refills | Status: AC | PRN

## 2019-09-17 MED ORDER — MEDROXYPROGESTERONE ACETATE 10 MG PO TABS: 10.0000 mg | ORAL_TABLET | Freq: Two times a day (BID) | ORAL | 6 refills | Status: DC

## 2019-09-17 MED ORDER — PREDNISONE 10 MG PO TABS: ORAL_TABLET | ORAL | 0 refills | Status: DC

## 2019-09-17 MED ORDER — METOPROLOL TARTRATE 50 MG PO TABS: 50.0000 mg | ORAL_TABLET | Freq: Two times a day (BID) | ORAL | 6 refills | Status: DC

## 2019-09-17 MED ORDER — CLOPIDOGREL BISULFATE 75 MG PO TABS: 75.0000 mg | ORAL_TABLET | Freq: Every day | ORAL | 6 refills | Status: DC

## 2019-09-17 MED FILL — FERROUS SULFATE 325 MG TAB: 325 (65 FE) | 30 days supply | Qty: 60 | Fill #0

## 2019-09-17 MED FILL — predniSONE 10 MG TABS: 10 | 5 days supply | Qty: 20 | Fill #0

## 2019-09-17 MED FILL — CLOPIDOGREL 75 MG TABLET: 75 | 30 days supply | Qty: 30 | Fill #0

## 2019-09-17 MED FILL — MELOXICAM 15 MG TABLET: 15 | 30 days supply | Qty: 30 | Fill #0

## 2019-09-17 MED FILL — METOPROLOL TARTRATE 50 MG T: 50 | 30 days supply | Qty: 60 | Fill #0

## 2019-09-17 MED FILL — MEDROXYPROGESTERONE 10 MG T: 10 | 30 days supply | Qty: 60 | Fill #0

## 2019-09-17 NOTE — Patient Instructions (Addendum)
Begin prednisone 10mg  4 tablets daily for 5 days then stop  Begin oxycodone 10mg  every 6 hours as needed for pain (only medication sent to [facility]  Stop diclofenac and begin Meloxicam 15mg  daily   All the above are for hip and knee pain  Plavix, metoprolol, provera, iron sent to our pharmacy for refills  Use the walker to ambulate  Keep April 12th orthopedic appointment  Return to Dr Margarita Rana in 6 weeks

## 2019-09-17 NOTE — Assessment & Plan Note (Signed)
Acute right hip pain and knee pain status post fall  CT of the hip and knee were unremarkable however I am concerned there could be inflammatory process occurring in the sacroiliac joint along as well as with the knee  Plan will be to begin pulse prednisone 40 mg a day for 5 days prescribed oxycodone 10 mg every 6 hours as needed for pain and meloxicam 15 mg daily  The patient has an orthopedic appointment on April 12 and has been encouraged to keep this appointment  We gave the patient a walker so that she could improve her ambulation and give support and reduce fall risk till she sees orthopedics

## 2019-09-17 NOTE — Assessment & Plan Note (Signed)
Refill iron supplement and repeat CBC

## 2019-09-17 NOTE — Progress Notes (Signed)
Pt states she fell on the 27th on March

## 2019-09-17 NOTE — Assessment & Plan Note (Addendum)
History of left middle cerebral artery stroke  The patient needs to resume anticoagulant Plavix along with lipid therapy

## 2019-09-17 NOTE — Assessment & Plan Note (Signed)
Ongoing tobacco use we again counseled this patient regards to need of smoking cessation

## 2019-09-17 NOTE — Assessment & Plan Note (Signed)
Still evident of abnormal uterine bleeding will refill the Provera 10 mg twice daily and repeat CBC

## 2019-09-17 NOTE — Progress Notes (Signed)
Subjective:    Patient ID: Tina Moss, female    DOB: 1970/07/26, 49 y.o.   MRN: [id number]  50 y.o.F here for post ED f/u hip pain.  Prior CVA , HTN, Sz D/O,  The patient has been in the emergency room twice since she initially felt March 27 while playing with her niece and other relatives.  She was attempting to kick her leg up high and raised her leg up twisted and fell over on her right hip and pelvis area.  She states since that time she has had intense pain in the right lower back area right hip area and right knee.  She has prior history of arthritis of the right knee.  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  Note she has been out of all of her medications and wishes refills.  She is not been in our clinic in some time.  She denies previous injury in the pelvic area.  When she went to the ER the second time a CT of the hip pelvis and right leg was performed no acute fracture seen but arthritis seen in the sacroiliac joints and also in the pubic bone area.  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 Anemia   . Epilepsy ([place])   . Fibroids   . H/O bacterial infection   . H/O mumps   . Hyperlipidemia   . Hypertension   . Seizures ([place])      Family History  Problem Relation Age of Onset  . Hypertension Mother   . Hypertension Sister   . Arthritis Sister        knees  . Hypertension Brother   . Deafness Brother   . Speech disorder Brother        mute  . Mental illness Brother        "he can just fly off"  . Mental illness Daughter        ? bipolar  . Scoliosis Son      Social History   Socioeconomic History  . Marital status: Divorced    Spouse name: n/a  . Number of children: 2  . Years of education: 11th grade  . Highest education level: 11th grade  Occupational History  . Occupation:  Housekeeper    Comment: [employer]  Tobacco Use  . Smoking status: Current Some Day Smoker    Types: Cigars  . Smokeless tobacco: Never Used  . Tobacco comment: "I think I just keep so much on my mind."  Substance and Sexual Activity  . Alcohol use: Yes    Alcohol/week: 0.0 - 3.0 standard drinks    Comment: social  . Drug use: Not Currently    Types: Marijuana    Comment: occasionally  . Sexual activity: Yes    Partners: Male    Birth control/protection: None  Other Topics Concern  . Not on file  Social History Narrative   Lives with her daughter.   Son is currently incarcerated in [place].   She completed 11th grade, but was kicked out and never when back, as she had become pregnant and was raising her daughter.   Divorced x 2.   Social Determinants of Health   Financial Resource Strain:   . Difficulty of Paying Living Expenses:   Food Insecurity:   . Worried About Estate manager/land agent  of Food in the Last Year:   . [place] in the Last Year:   Transportation Needs: Unmet Transportation Needs  . Lack of Transportation (Medical): Yes  . Lack of Transportation (Non-Medical): Yes  Physical Activity:   . Days of Exercise per Week:   . Minutes of Exercise per Session:   Stress:   . Feeling of Stress :   Social Connections:   . Frequency of Communication with Friends and Family:   . Frequency of Social Gatherings with Friends and Family:   . Attends Religious Services:   . Active Member of Clubs or Organizations:   . Attends Archivist Meetings:   Marland Kitchen Marital Status:   Intimate Partner Violence:   . Fear of Current or Ex-Partner:   . Emotionally Abused:   Marland Kitchen Physically Abused:   . Sexually Abused:      No Known Allergies   Outpatient Medications Prior to Visit  Medication Sig Dispense Refill  . atorvastatin (LIPITOR) 20 MG tablet Take 1 tablet (20 mg total) by mouth daily at 6 PM. 30 tablet 6  . clopidogrel (PLAVIX) 75 MG tablet Take 1 tablet (75 mg total) by mouth  daily. 30 tablet 6  . cyclobenzaprine (FLEXERIL) 10 MG tablet Take 1 tablet (10 mg total) by mouth 2 (two) times daily as needed for muscle spasms. 10 tablet 0  . diclofenac (VOLTAREN) 25 MG EC tablet Take 1 tablet (25 mg total) by mouth 2 (two) times daily for 5 days. 10 tablet 0  . diclofenac sodium (VOLTAREN) 1 % GEL Apply 4 g topically 4 (four) times daily. 100 g 1  . famotidine (PEPCID) 10 MG tablet Take 1 tablet (10 mg total) by mouth 2 (two) times daily. 60 tablet 6  . ferrous sulfate 325 (65 FE) MG tablet Take 1 tablet (325 mg total) by mouth 2 (two) times daily with a meal. 60 tablet 3  . furosemide (LASIX) 20 MG tablet Take 1 tablet (20 mg total) by mouth daily. 5 tablet 0  . gabapentin (NEURONTIN) 300 MG capsule Take 1 capsule (300 mg total) by mouth 2 (two) times daily. 60 capsule 3  . HYDROcodone-acetaminophen (NORCO/VICODIN) 5-325 MG tablet Take 2 tablets by mouth every 4 (four) hours as needed for severe pain. 6 tablet 0  . medroxyPROGESTERone (PROVERA) 10 MG tablet Take 1 tablet (10 mg total) by mouth 2 (two) times daily. 60 tablet 6  . metoprolol tartrate (LOPRESSOR) 50 MG tablet Take 1 tablet (50 mg total) by mouth 2 (two) times daily. 60 tablet 6  . predniSONE (DELTASONE) 20 MG tablet 3 pills on first day then 2 pills x 2 days, 1 pill x 2 days then 1/2 pill x 4 days; take after eating 11 tablet 0  . Vitamin D, Ergocalciferol, (DRISDOL) 1.25 MG (50000 UT) CAPS capsule Take 1 capsule (50,000 Units total) by mouth every 7 (seven) days. 16 capsule 0   No facility-administered medications prior to visit.      Review of Systems Constitutional:   No  weight loss, night sweats,  Fevers, chills, fatigue, lassitude. HEENT:   No headaches,  Difficulty swallowing,  Tooth/dental problems,  Sore throat,                No sneezing, itching, ear ache, nasal congestion, post nasal drip,   CV:  No chest pain,  Orthopnea, PND, swelling in lower extremities, anasarca, dizziness,  palpitations  GI  No heartburn, indigestion, abdominal pain, nausea, vomiting,  diarrhea, change in bowel habits, loss of appetite  Resp: No shortness of breath with exertion or at rest.  No excess mucus, no productive cough,  No non-productive cough,  No coughing up of blood.  No change in color of mucus.  No wheezing.  No chest wall deformity  Skin: no rash or lesions.  GU: no dysuria, change in color of urine, no urgency or frequency.  No flank pain.  MS:   joint pain  swelling.  No decreased range of motion.  back pain.  Psych:  No change in mood or affect. No depression or anxiety.  No memory loss.     Objective:   Physical Exam Vitals:   09/17/19 1101  BP: 123/85  Pulse: 100  Resp: 16  SpO2: 97%    Gen: Pleasant, well-nourished, in no distress, anxious affect  ENT: No lesions,  mouth clear,  oropharynx clear, no postnasal drip  Neck: No JVD, no TMG, no carotid bruits  Lungs: No use of accessory muscles, no dullness to percussion, clear without rales or rhonchi  Cardiovascular: RRR, heart sounds normal, no murmur or gallops, no peripheral edema  Abdomen: soft and NT, no HSM,  BS normal  Musculoskeletal: Severe point tenderness in the right lower back at the point of the iliac crest radiating laterally but the actual femoral hip is not affected also extreme tenderness in the lateral aspect of the right knee with decreased range of motion of the right knee  Neuro: alert, non focal  Skin: Warm, no lesions or rashes   All imaging studies done in the emergency room visits are reviewed and are in the [facility] system  No labs have been recently obtained     Assessment & Plan:  I personally reviewed all images and lab data in the [facility] system as well as any outside material available during this office visit and agree with the  radiology impressions.   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  The patient needs to resume  anticoagulant Plavix along with lipid therapy  Essential hypertension History of hypertension well-controlled on metoprolol 50 mg twice daily  We will continue same and refill  Abnormal uterine bleeding (AUB) Still evident of abnormal uterine bleeding will refill the Provera 10 mg twice daily and repeat CBC   Tobacco use Ongoing tobacco use we again counseled this patient regards to need of smoking cessation  Anemia Refill iron supplement and repeat CBC  Hip pain, acute, right Acute right hip pain and knee pain status post fall  CT of the hip and knee were unremarkable however I am concerned there could be inflammatory process occurring in the sacroiliac joint along as well as with the knee  Plan will be to begin pulse prednisone 40 mg a day for 5 days prescribed oxycodone 10 mg every 6 hours as needed for pain and meloxicam 15 mg daily  The patient has an orthopedic appointment on April 12 and has been encouraged to keep this appointment  We gave the patient a walker so that she could improve her ambulation and give support and reduce fall risk till she sees orthopedics   Tina Moss was seen today for hospitalization follow-up.  Diagnoses and all orders for this visit:  Acute pain of right knee  Left middle cerebral artery stroke (HCC) -     atorvastatin (LIPITOR) 20 MG tablet; Take 1 tablet (20 mg total) by mouth daily at 6 PM. -  clopidogrel (PLAVIX) 75 MG tablet; Take 1 tablet (75 mg total) by mouth daily. -     Lipid panel  Iron deficiency anemia due to chronic blood loss -     ferrous sulfate 325 (65 FE) MG tablet; Take 1 tablet (325 mg total) by mouth 2 (two) times daily with a meal. -     Cancel: CBC with Differential/Platelet; Future -     CBC with Differential/Platelet  DUB (dysfunctional uterine bleeding) -     ferrous sulfate 325 (65 FE) MG tablet; Take 1 tablet (325 mg total) by mouth 2 (two) times daily with a meal. -     Cancel: CBC with  Differential/Platelet; Future -     medroxyPROGESTERone (PROVERA) 10 MG tablet; Take 1 tablet (10 mg total) by mouth 2 (two) times daily. -     CBC with Differential/Platelet  Essential hypertension -     metoprolol tartrate (LOPRESSOR) 50 MG tablet; Take 1 tablet (50 mg total) by mouth in the morning and at bedtime. -     Comprehensive metabolic panel -     Lipid panel  History of stroke  Abnormal uterine bleeding (AUB)  Tobacco use  Hip pain, acute, right  Other orders -     meloxicam (MOBIC) 15 MG tablet; Take 1 tablet (15 mg total) by mouth daily. -     predniSONE (DELTASONE) 10 MG tablet; Take 4 tablets daily for 5 days then stop -     Oxycodone HCl 10 MG TABS; Take 1 tablet (10 mg total) by mouth every 6 (six) hours as needed for up to 7 days.   [place] controlled substance database was reviewed

## 2019-09-17 NOTE — Assessment & Plan Note (Signed)
History of hypertension well-controlled on metoprolol 50 mg twice daily  We will continue same and refill

## 2019-09-18 ENCOUNTER — Telehealth: Payer: Self-pay | Admitting: Critical Care Medicine

## 2019-09-18 DIAGNOSIS — I63512 Cerebral infarction due to unspecified occlusion or stenosis of left middle cerebral artery: Secondary | ICD-10-CM

## 2019-09-18 DIAGNOSIS — E785 Hyperlipidemia, unspecified: Secondary | ICD-10-CM | POA: Insufficient documentation

## 2019-09-18 LAB — COMPREHENSIVE METABOLIC PANEL
ALT: 10 IU/L (ref 0–32)
AST: 16 IU/L (ref 0–40)
Albumin/Globulin Ratio: 1.6 (ref 1.2–2.2)
Albumin: 4.2 g/dL (ref 3.8–4.8)
Alkaline Phosphatase: 100 IU/L (ref 39–117)
BUN/Creatinine Ratio: 17 (ref 9–23)
BUN: 14 mg/dL (ref 6–24)
Bilirubin Total: 0.3 mg/dL (ref 0.0–1.2)
CO2: 23 mmol/L (ref 20–29)
Calcium: 9.4 mg/dL (ref 8.7–10.2)
Chloride: 105 mmol/L (ref 96–106)
Creatinine, Ser: 0.84 mg/dL (ref 0.57–1.00)
GFR calc Af Amer: 94 mL/min/{1.73_m2} (ref >59)
GFR calc non Af Amer: 81 mL/min/{1.73_m2} (ref >59)
Globulin, Total: 2.7 g/dL (ref 1.5–4.5)
Glucose: 89 mg/dL (ref 65–99)
Potassium: 3.9 mmol/L (ref 3.5–5.2)
Sodium: 141 mmol/L (ref 134–144)
Total Protein: 6.9 g/dL (ref 6.0–8.5)

## 2019-09-18 LAB — CBC WITH DIFFERENTIAL/PLATELET
Basophils Absolute: 0.1 10*3/uL (ref 0.0–0.2)
Basos: 2 %
EOS (ABSOLUTE): 0.1 10*3/uL (ref 0.0–0.4)
Eos: 2 %
Hematocrit: 36.2 % (ref 34.0–46.6)
Hemoglobin: 12 g/dL (ref 11.1–15.9)
Immature Grans (Abs): 0 10*3/uL (ref 0.0–0.1)
Immature Granulocytes: 0 %
Lymphocytes Absolute: 1.8 10*3/uL (ref 0.7–3.1)
Lymphs: 38 %
MCH: 28.6 pg (ref 26.6–33.0)
MCHC: 33.1 g/dL (ref 31.5–35.7)
MCV: 86 fL (ref 79–97)
Monocytes Absolute: 0.6 10*3/uL (ref 0.1–0.9)
Monocytes: 13 %
Neutrophils Absolute: 2.1 10*3/uL (ref 1.4–7.0)
Neutrophils: 45 %
Platelets: 265 10*3/uL (ref 150–450)
RBC: 4.2 x10E6/uL (ref 3.77–5.28)
RDW: 17.3 % — ABNORMAL HIGH (ref 11.7–15.4)
WBC: 4.7 10*3/uL (ref 3.4–10.8)

## 2019-09-18 LAB — LIPID PANEL
Chol/HDL Ratio: 3.8 ratio (ref 0.0–4.4)
Cholesterol, Total: 205 mg/dL — ABNORMAL HIGH (ref 100–199)
HDL: 54 mg/dL (ref >39)
LDL Chol Calc (NIH): 143 mg/dL — ABNORMAL HIGH (ref 0–99)
Triglycerides: 47 mg/dL (ref 0–149)
VLDL Cholesterol Cal: 8 mg/dL (ref 5–40)

## 2019-09-18 MED ORDER — ATORVASTATIN CALCIUM 40 MG PO TABS: 40.0000 mg | ORAL_TABLET | Freq: Every day | ORAL | 1 refills | Status: DC

## 2019-09-18 MED FILL — ATORVASTATIN CALCIUM 40 MG: 40 | 30 days supply | Qty: 30 | Fill #0

## 2019-09-18 NOTE — Telephone Encounter (Signed)
LDL 143, increase Atorvastatin to 40mg  /d

## 2019-09-18 NOTE — Assessment & Plan Note (Signed)
LDL 143, will increase atovastatin to 40mg  daily

## 2019-10-01 ENCOUNTER — Other Ambulatory Visit: Payer: Self-pay | Admitting: Family Medicine

## 2019-10-01 ENCOUNTER — Telehealth: Payer: Self-pay | Admitting: Family Medicine

## 2019-10-01 MED ORDER — ACETAMINOPHEN-CODEINE #3 300-30 MG PO TABS: 1.0000 | ORAL_TABLET | Freq: Two times a day (BID) | ORAL | 0 refills | Status: DC | PRN

## 2019-10-01 NOTE — Telephone Encounter (Signed)
Halle, can you please inform her I have sent a prescription for Tylenol 3 to the pharmacy for her.  Unfortunately I am unable to prescribe oxycodone for her.  She should have seen her orthopedic on 09/24/2019.  If Tylenol #3 does not suffice, I am happy to refer her to pain management. Thank you.

## 2019-10-01 NOTE — Telephone Encounter (Signed)
Patent called and requested for a refill on the Oxycodone HCl 10 MG TABS [id number]  Until she goes back with the surgeon again. Please follow up at your earliest convenience.  [facility] [address] and [address].

## 2019-10-01 NOTE — Telephone Encounter (Signed)
Patient was called, verified by 2 patient identifiers. Patient was informed of tylenol 3 script being sent to pharmacy. Patient verbalized understanding and had no further questions or concerns at this time.

## 2019-10-01 NOTE — Telephone Encounter (Signed)
Eno,  I am not comfortable refilling this pts oxycodone.  I did see her when you were out.   She was to see the surgeon on 09/24/19

## 2019-10-02 MED FILL — ACETAMINOPHEN/COD #3 TABLET: 300-30 | 10 days supply | Qty: 20 | Fill #0

## 2019-10-05 ENCOUNTER — Telehealth: Payer: Self-pay | Admitting: Family Medicine

## 2019-10-05 ENCOUNTER — Other Ambulatory Visit: Payer: Self-pay | Admitting: Orthopedic Surgery

## 2019-10-05 DIAGNOSIS — M25561 Pain in right knee: Secondary | ICD-10-CM

## 2019-10-05 DIAGNOSIS — M7121 Synovial cyst of popliteal space [Baker], right knee: Secondary | ICD-10-CM

## 2019-10-05 DIAGNOSIS — W19XXXA Unspecified fall, initial encounter: Secondary | ICD-10-CM

## 2019-10-05 DIAGNOSIS — M25551 Pain in right hip: Secondary | ICD-10-CM

## 2019-10-05 NOTE — Telephone Encounter (Signed)
She was supposed to follow-up with her orthopedic on 4/12 and needs to do so.  Just like I informed her previously I will be referring her to pain management as Tylenol 3 and Tramadol are all we can prescribe here.

## 2019-10-05 NOTE — Telephone Encounter (Signed)
Patient is requesting stronger pain medication due to current medication not working.

## 2019-10-05 NOTE — Telephone Encounter (Signed)
Patient was rx Tylenol 3  But is not working and she is in a severe Right leg  pain  And want something stronger . [facility] use  [facility] [address] . Thank you

## 2019-10-05 NOTE — Telephone Encounter (Signed)
Patient was called and informed of referral being placed for her.

## 2019-10-10 ENCOUNTER — Encounter: Payer: Self-pay | Admitting: Physical Medicine and Rehabilitation

## 2019-10-23 ENCOUNTER — Telehealth: Payer: Self-pay | Admitting: Family Medicine

## 2019-10-23 NOTE — Telephone Encounter (Signed)
Patient called in and requested for listed medication to be refilled and sent to [facility] pharmacy.  meloxicam (MOBIC) 15 MG tablet [id number]

## 2019-10-24 ENCOUNTER — Ambulatory Visit: Payer: Self-pay | Admitting: Family Medicine

## 2019-10-24 MED ORDER — MELOXICAM 15 MG PO TABS: 15.0000 mg | ORAL_TABLET | Freq: Every day | ORAL | 0 refills | Status: DC

## 2019-10-25 ENCOUNTER — Ambulatory Visit: Payer: Self-pay | Admitting: Family Medicine

## 2019-10-29 ENCOUNTER — Encounter: Payer: Self-pay | Admitting: Physical Medicine and Rehabilitation

## 2019-11-01 ENCOUNTER — Ambulatory Visit (HOSPITAL_BASED_OUTPATIENT_CLINIC_OR_DEPARTMENT_OTHER): Payer: Self-pay | Admitting: Family

## 2019-11-01 DIAGNOSIS — Z5329 Procedure and treatment not carried out because of patient's decision for other reasons: Secondary | ICD-10-CM

## 2019-11-01 NOTE — Progress Notes (Signed)
Patient did not show for appointment.   

## 2019-12-02 ENCOUNTER — Other Ambulatory Visit: Payer: Self-pay

## 2019-12-02 ENCOUNTER — Emergency Department (HOSPITAL_COMMUNITY)
Admission: EM | Admit: 2019-12-02 | Discharge: 2019-12-02 | Disposition: A | Discharge status: Home / Self Care | Payer: Self-pay | Attending: Emergency Medicine | Admitting: Emergency Medicine

## 2019-12-02 ENCOUNTER — Encounter (HOSPITAL_COMMUNITY): Payer: Self-pay | Admitting: Emergency Medicine

## 2019-12-02 ENCOUNTER — Emergency Department (HOSPITAL_COMMUNITY): Payer: Self-pay

## 2019-12-02 DIAGNOSIS — I10 Essential (primary) hypertension: Secondary | ICD-10-CM | POA: Insufficient documentation

## 2019-12-02 DIAGNOSIS — R197 Diarrhea, unspecified: Secondary | ICD-10-CM | POA: Insufficient documentation

## 2019-12-02 DIAGNOSIS — R05 Cough: Secondary | ICD-10-CM | POA: Insufficient documentation

## 2019-12-02 DIAGNOSIS — F17290 Nicotine dependence, other tobacco product, uncomplicated: Secondary | ICD-10-CM | POA: Insufficient documentation

## 2019-12-02 DIAGNOSIS — Z20822 Contact with and (suspected) exposure to covid-19: Secondary | ICD-10-CM | POA: Insufficient documentation

## 2019-12-02 DIAGNOSIS — B349 Viral infection, unspecified: Secondary | ICD-10-CM

## 2019-12-02 DIAGNOSIS — Z7902 Long term (current) use of antithrombotics/antiplatelets: Secondary | ICD-10-CM | POA: Insufficient documentation

## 2019-12-02 DIAGNOSIS — Z79899 Other long term (current) drug therapy: Secondary | ICD-10-CM | POA: Insufficient documentation

## 2019-12-02 LAB — URINALYSIS, ROUTINE W REFLEX MICROSCOPIC
Bacteria, UA: NONE SEEN
Bilirubin Urine: NEGATIVE
Glucose, UA: NEGATIVE mg/dL
Hgb urine dipstick: NEGATIVE
Ketones, ur: 20 mg/dL — AB
Leukocytes,Ua: NEGATIVE
Nitrite: NEGATIVE
Protein, ur: 30 mg/dL — AB
Specific Gravity, Urine: 1.026 (ref 1.005–1.030)
pH: 5 (ref 5.0–8.0)

## 2019-12-02 LAB — SARS CORONAVIRUS 2 BY RT PCR (HOSPITAL ORDER, PERFORMED IN ~~LOC~~ HOSPITAL LAB): SARS Coronavirus 2: NEGATIVE

## 2019-12-02 MED ORDER — ACETAMINOPHEN 325 MG PO TABS
650.0000 mg | ORAL_TABLET | Freq: Once | ORAL | Status: AC
  Administered 2019-12-02: 650 mg via ORAL
  Filled 2019-12-02: qty 2

## 2019-12-02 MED ORDER — BENZONATATE 100 MG PO CAPS
200.0000 mg | ORAL_CAPSULE | Freq: Once | ORAL | Status: AC
  Administered 2019-12-02: 200 mg via ORAL
  Filled 2019-12-02: qty 2

## 2019-12-02 MED ORDER — BENZONATATE 100 MG PO CAPS
100.0000 mg | ORAL_CAPSULE | Freq: Three times a day (TID) | ORAL | 0 refills | Status: DC
Start: 2019-12-02 — End: 2020-07-15

## 2019-12-02 NOTE — ED Provider Notes (Signed)
[facility] EMERGENCY DEPARTMENT Provider Note   CSN: [id number] Arrival date & time: 12/02/19  1540     History Chief Complaint  Patient presents with  . ? COVID  . Diarrhea  . Cough    Tina Moss is a 49 y.o. female.  49 year old female who presents with URI symptoms along with diarrhea x3 days.  Patient concern for possible Covid exposure.  She denies any dyspnea on exertion.  Has had some urinary frequency but denies any abdominal or flank discomfort.  No sore throat.  No loss of smell or taste.  Symptoms have been progressive in nothing makes them better worse no treatment use prior to arrival.        Past Medical History:  Diagnosis Date  . Anemia   . Epilepsy ([place])   . Fibroids   . H/O bacterial infection   . H/O mumps   . Hyperlipidemia   . Hypertension   . Seizures [facility])     Patient Active Problem List   Diagnosis Date Noted  . Hyperlipidemia LDL goal <100 09/18/2019  . Acute pain of right knee 09/17/2019  . Hip pain, acute, right 09/17/2019  . Abnormal uterine bleeding (AUB) 02/13/2019  . 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  . Tobacco use 04/04/2018  . Heart palpitations 12/14/2016  . LVH (left ventricular hypertrophy) 12/14/2016  . Depression 11/15/2016  . Essential hypertension 11/15/2016  . Fibroids 01/18/2012  . Menorrhagia 01/18/2012  . Seizure disorder ([place]) 01/18/2012  . Anemia 01/18/2012    Past Surgical History:  Procedure Laterality Date  . CESAREAN SECTION     x2. at term  . TEE WITHOUT CARDIOVERSION N/A 04/06/2018   Procedure: TRANSESOPHAGEAL ECHOCARDIOGRAM (TEE) BUBBLE STUDY;  Surgeon: Lelon Perla, MD;  Location: [facility] ENDOSCOPY;  Service: Cardiovascular;  Laterality: N/A;  . TUBAL LIGATION    . WISDOM TOOTH EXTRACTION       OB History    Gravida  3   Para  2   Term  2   Preterm      AB  1   Living  2     SAB  1   TAB      Ectopic      Multiple      Live Births    2        Obstetric Comments  Term c/s x 2.         Family History  Problem Relation Age of Onset  . Hypertension Mother   . Hypertension Sister   . Arthritis Sister        knees  . Hypertension Brother   . Deafness Brother   . Speech disorder Brother        mute  . Mental illness Brother        "he can just fly off"  . Mental illness Daughter        ? bipolar  . Scoliosis Son     Social History   Tobacco Use  . Smoking status: Current Some Day Smoker    Types: Cigars  . Smokeless tobacco: Never Used  . Tobacco comment: "I think I just keep so much on my mind."  Vaping Use  . Vaping Use: Never used  Substance Use Topics  . Alcohol use: Yes    Alcohol/week: 0.0 - 3.0 standard drinks    Comment: social  . Drug use: Not Currently    Types: Marijuana    Comment:  occasionally    Home Medications Prior to Admission medications   Medication Sig Start Date End Date Taking? Authorizing Provider  acetaminophen-codeine (TYLENOL #3) 300-30 MG tablet Take 1 tablet by mouth every 12 (twelve) hours as needed for moderate pain. 10/01/19   Charlott Rakes, MD  atorvastatin (LIPITOR) 40 MG tablet Take 1 tablet (40 mg total) by mouth daily at 6 PM. 09/18/19   Elsie Stain, MD  clopidogrel (PLAVIX) 75 MG tablet Take 1 tablet (75 mg total) by mouth daily. 09/17/19   Elsie Stain, MD  ferrous sulfate 325 (65 FE) MG tablet Take 1 tablet (325 mg total) by mouth 2 (two) times daily with a meal. 09/17/19   Elsie Stain, MD  medroxyPROGESTERone (PROVERA) 10 MG tablet Take 1 tablet (10 mg total) by mouth 2 (two) times daily. 09/17/19   Elsie Stain, MD  meloxicam (MOBIC) 15 MG tablet Take 1 tablet (15 mg total) by mouth daily. 10/24/19   Charlott Rakes, MD  metoprolol tartrate (LOPRESSOR) 50 MG tablet Take 1 tablet (50 mg total) by mouth in the morning and at bedtime. 09/17/19   Elsie Stain, MD  predniSONE (DELTASONE) 10 MG tablet Take 4 tablets daily for 5 days then stop  09/17/19   Elsie Stain, MD    Allergies    Patient has no known allergies.  Review of Systems   Review of Systems  All other systems reviewed and are negative.   Physical Exam Updated Vital Signs BP (!) 159/104 (BP Location: Left Arm)   Pulse (!) 109   Temp 98.5 F (36.9 C) (Oral)   Resp 18   SpO2 97%   Physical Exam Vitals and nursing note reviewed.  Constitutional:      General: She is not in acute distress.    Appearance: Normal appearance. She is well-developed. She is not toxic-appearing.  HENT:     Head: Normocephalic and atraumatic.  Eyes:     General: Lids are normal.     Conjunctiva/sclera: Conjunctivae normal.     Pupils: Pupils are equal, round, and reactive to light.  Neck:     Thyroid: No thyroid mass.     Trachea: No tracheal deviation.  Cardiovascular:     Rate and Rhythm: Normal rate and regular rhythm.     Heart sounds: Normal heart sounds. No murmur heard.  No gallop.   Pulmonary:     Effort: Pulmonary effort is normal. No respiratory distress.     Breath sounds: Normal breath sounds. No stridor. No decreased breath sounds, wheezing, rhonchi or rales.  Abdominal:     General: Bowel sounds are normal. There is no distension.     Palpations: Abdomen is soft.     Tenderness: There is no abdominal tenderness. There is no rebound.  Musculoskeletal:        General: No tenderness. Normal range of motion.     Cervical back: Normal range of motion and neck supple.  Skin:    General: Skin is warm and dry.     Findings: No abrasion or rash.  Neurological:     Mental Status: She is alert and oriented to person, place, and time.     GCS: GCS eye subscore is 4. GCS verbal subscore is 5. GCS motor subscore is 6.     Cranial Nerves: No cranial nerve deficit.     Sensory: No sensory deficit.  Psychiatric:        Speech: Speech normal.  Behavior: Behavior normal.     ED Results / Procedures / Treatments   Labs (all labs ordered are listed, but  only abnormal results are displayed) Labs Reviewed  SARS CORONAVIRUS 2 BY RT PCR (HOSPITAL ORDER, [facility])  URINALYSIS, ROUTINE W REFLEX MICROSCOPIC    EKG None  Radiology No results found.  Procedures Procedures (including critical care time)  Medications Ordered in ED Medications  benzonatate (TESSALON) capsule 200 mg (has no administration in time range)    ED Course  I have reviewed the triage vital signs and the nursing notes.  Pertinent labs & imaging results that were available during my care of the patient were reviewed by me and considered in my medical decision making (see chart for details).    MDM Rules/Calculators/A&P                         Chest x-ray without acute findings. Urinalysis and Covid test negative.  Patient with likely viral illness. Final Clinical Impression(s) / ED Diagnoses Final diagnoses:  None    Rx / DC Orders ED Discharge Orders    None       Lacretia Leigh, MD 12/02/19 [phone]

## 2019-12-02 NOTE — ED Triage Notes (Addendum)
Requesting COVID testing.  Reports diarrhea, generalized body aches, unable to sleep, productive cough with brown phlegm, chills, and runny nose x 2 days.  No known COVID exposures.

## 2019-12-05 ENCOUNTER — Telehealth: Payer: Self-pay | Admitting: Family Medicine

## 2019-12-05 MED ORDER — TIZANIDINE HCL 4 MG PO TABS
4.0000 mg | ORAL_TABLET | Freq: Three times a day (TID) | ORAL | 0 refills | Status: DC | PRN
Start: 2019-12-05 — End: 2020-07-15

## 2019-12-05 NOTE — Telephone Encounter (Signed)
Patient called in and requested for a prescription for her bad cough and pain in her back. Patient stated that when she coughs now that she has a pain in her back. Patient stated that she has been having this cough for 2 weeks and went to the ED on 6.20.21. patient stated that she was informed that her cough was not from covid but it was contagious. Please follow up at your earliest convenience.  [facility] [address]

## 2019-12-05 NOTE — Telephone Encounter (Signed)
Prescription for tizanidine has been sent to the pharmacy.

## 2019-12-05 NOTE — Telephone Encounter (Signed)
Patient was called and informed of medication to pharmacy.

## 2019-12-05 NOTE — Telephone Encounter (Signed)
Patient was seen in ED for Cough, she was given tessalon pearls for her cough and nothing else. Patient states that she is having pin in her back from coughing so much.

## 2020-01-03 ENCOUNTER — Inpatient Hospital Stay: Payer: Self-pay | Admitting: Family Medicine

## 2020-05-13 ENCOUNTER — Ambulatory Visit: Payer: Self-pay | Admitting: *Deleted

## 2020-05-13 NOTE — Telephone Encounter (Signed)
FYI,  Patient was informed to go to ED and hen follow up with PCP

## 2020-05-13 NOTE — Telephone Encounter (Signed)
Patient is calling to report she is having numbness in her R hand and fingertips. Patient states she has been having this symptom for 2 weeks- but her symptom have become constant and she is having increasing symptoms to accompany. Anxious, dizzy, vision changes. Patient advised ED for evaluation and then follow up in the office. Patient states she has been without medications for at least 1 month- advised patient this could be why she has the symptoms she is having. Advised patient to call office for appointment after her ED visit- she states she will do that.  Reason for Disposition . [1] Numbness (i.e., loss of sensation) of the face, arm / hand, or leg / foot on one side of the body AND [2] sudden onset AND [3] brief (now gone)  Answer Assessment - Initial Assessment Questions 1. SYMPTOM: "What is the main symptom you are concerned about?" (e.g., weakness, numbness)     Patient is experiencing numbness in her hand and fingertips- right 2. ONSET: "When did this start?" (minutes, hours, days; while sleeping)     2 weeks- has become constant 3. LAST NORMAL: "When was the last time you were normal (no symptoms)?"     Over 2 weeks ago 4. PATTERN "Does this come and go, or has it been constant since it started?"  "Is it present now?"     Constant- not going away 5. CARDIAC SYMPTOMS: "Have you had any of the following symptoms: chest pain, difficulty breathing, palpitations?"     palpitations crossing bridges makes her nervous 6. NEUROLOGIC SYMPTOMS: "Have you had any of the following symptoms: headache, dizziness, vision loss, double vision, changes in speech, unsteady on your feet?"     Dizziness, speech changes, eye blurred- come and goes 7. OTHER SYMPTOMS: "Do you have any other symptoms?"     Knee pain 8. PREGNANCY: "Is there any chance you are pregnant?" "When was your last menstrual period?"     n/a  Protocols used: NEUROLOGIC DEFICIT-A-AH

## 2020-07-15 ENCOUNTER — Other Ambulatory Visit: Payer: Self-pay | Admitting: Physician Assistant

## 2020-07-15 ENCOUNTER — Encounter: Payer: Self-pay | Admitting: Physician Assistant

## 2020-07-15 ENCOUNTER — Other Ambulatory Visit: Payer: Self-pay

## 2020-07-15 ENCOUNTER — Other Ambulatory Visit (HOSPITAL_COMMUNITY)
Admission: RE | Admit: 2020-07-15 | Discharge: 2020-07-15 | Disposition: A | Discharge status: Home / Self Care | Payer: Self-pay | Source: Ambulatory Visit | Attending: Physician Assistant | Admitting: Physician Assistant

## 2020-07-15 ENCOUNTER — Ambulatory Visit (INDEPENDENT_AMBULATORY_CARE_PROVIDER_SITE_OTHER): Payer: Self-pay | Admitting: Physician Assistant

## 2020-07-15 VITALS — BP 180/105 | HR 84 | Temp 97.2°F | Resp 18 | Ht 67.0 in | Wt 178.0 lb

## 2020-07-15 DIAGNOSIS — B353 Tinea pedis: Secondary | ICD-10-CM

## 2020-07-15 DIAGNOSIS — G8929 Other chronic pain: Secondary | ICD-10-CM

## 2020-07-15 DIAGNOSIS — N938 Other specified abnormal uterine and vaginal bleeding: Secondary | ICD-10-CM

## 2020-07-15 DIAGNOSIS — E559 Vitamin D deficiency, unspecified: Secondary | ICD-10-CM

## 2020-07-15 DIAGNOSIS — I63512 Cerebral infarction due to unspecified occlusion or stenosis of left middle cerebral artery: Secondary | ICD-10-CM

## 2020-07-15 DIAGNOSIS — D500 Iron deficiency anemia secondary to blood loss (chronic): Secondary | ICD-10-CM

## 2020-07-15 DIAGNOSIS — F411 Generalized anxiety disorder: Secondary | ICD-10-CM

## 2020-07-15 DIAGNOSIS — I10 Essential (primary) hypertension: Secondary | ICD-10-CM

## 2020-07-15 DIAGNOSIS — N760 Acute vaginitis: Secondary | ICD-10-CM

## 2020-07-15 DIAGNOSIS — E785 Hyperlipidemia, unspecified: Secondary | ICD-10-CM

## 2020-07-15 DIAGNOSIS — B9689 Other specified bacterial agents as the cause of diseases classified elsewhere: Secondary | ICD-10-CM

## 2020-07-15 DIAGNOSIS — D72819 Decreased white blood cell count, unspecified: Secondary | ICD-10-CM

## 2020-07-15 LAB — POCT URINALYSIS DIP (CLINITEK)
Bilirubin, UA: NEGATIVE
Glucose, UA: NEGATIVE mg/dL
Ketones, POC UA: NEGATIVE mg/dL
Leukocytes, UA: NEGATIVE
Nitrite, UA: NEGATIVE
POC PROTEIN,UA: NEGATIVE
Spec Grav, UA: 1.015 (ref 1.010–1.025)
Urobilinogen, UA: 1 E.U./dL
pH, UA: 7 (ref 5.0–8.0)

## 2020-07-15 MED ORDER — HYDROXYZINE HCL 10 MG PO TABS: 10.0000 mg | ORAL_TABLET | Freq: Three times a day (TID) | ORAL | 0 refills | Status: DC | PRN

## 2020-07-15 MED ORDER — HYDROXYZINE HCL 50 MG PO TABS: ORAL_TABLET | ORAL | 0 refills | Status: DC

## 2020-07-15 MED ORDER — KETOCONAZOLE 2 % EX CREA: 1.0000 "application " | TOPICAL_CREAM | Freq: Every day | CUTANEOUS | 0 refills | Status: DC

## 2020-07-15 MED ORDER — MEDROXYPROGESTERONE ACETATE 10 MG PO TABS: 10.0000 mg | ORAL_TABLET | Freq: Two times a day (BID) | ORAL | 6 refills | Status: DC

## 2020-07-15 MED ORDER — ATORVASTATIN CALCIUM 40 MG PO TABS: 40.0000 mg | ORAL_TABLET | Freq: Every day | ORAL | 1 refills | Status: DC

## 2020-07-15 MED ORDER — FERROUS SULFATE 325 (65 FE) MG PO TABS: 325.0000 mg | ORAL_TABLET | Freq: Two times a day (BID) | ORAL | 3 refills | Status: DC

## 2020-07-15 MED ORDER — METOPROLOL TARTRATE 50 MG PO TABS: 50.0000 mg | ORAL_TABLET | Freq: Two times a day (BID) | ORAL | 6 refills | Status: DC

## 2020-07-15 MED ORDER — CLOPIDOGREL BISULFATE 75 MG PO TABS: 75.0000 mg | ORAL_TABLET | Freq: Every day | ORAL | 6 refills | Status: DC

## 2020-07-15 MED ORDER — DULOXETINE HCL 30 MG PO CPEP: 30.0000 mg | ORAL_CAPSULE | Freq: Every day | ORAL | 1 refills | Status: DC

## 2020-07-15 MED ORDER — TIZANIDINE HCL 4 MG PO TABS: 4.0000 mg | ORAL_TABLET | Freq: Three times a day (TID) | ORAL | 0 refills | Status: DC | PRN

## 2020-07-15 MED FILL — ATORVASTATIN CALCIUM 40 MG: 40 | 30 days supply | Qty: 30 | Fill #0

## 2020-07-15 MED FILL — FERROUS SULFATE 325 MG TAB: 325 (65 FE) | 30 days supply | Qty: 60 | Fill #0

## 2020-07-15 MED FILL — DULoxetine HCL 30 MG CPEP: 30 | 30 days supply | Qty: 30 | Fill #0

## 2020-07-15 MED FILL — hydrOXYzine HCL 10 MG TABS: 10 | 10 days supply | Qty: 30 | Fill #0

## 2020-07-15 MED FILL — KETOCONAZOLE 2% CREAM: 2 | 7 days supply | Qty: 15 | Fill #0

## 2020-07-15 MED FILL — CLOPIDOGREL 75 MG TABLET: 75 | 30 days supply | Qty: 30 | Fill #0

## 2020-07-15 MED FILL — tiZANidine HCL 4 MG TABS: 4 | 10 days supply | Qty: 30 | Fill #0

## 2020-07-15 MED FILL — MEDROXYPROGESTERONE ACETATE: 10 | 30 days supply | Qty: 60 | Fill #0

## 2020-07-15 MED FILL — hydrOXYzine HCL 50 MG TABS: 50 | 30 days supply | Qty: 30 | Fill #0

## 2020-07-15 MED FILL — METOPROLOL TARTRATE 50 MG T: 50 | 30 days supply | Qty: 60 | Fill #0

## 2020-07-15 NOTE — Patient Instructions (Signed)
I refilled your medications for you, make sure that you are taking your blood pressure medication on a regular basis, please check your blood pressure at home, keep a written log and bring that with you to your next office visit so we can evaluate if we need to make any changes to your blood pressure medication.  For your anxiety, you can use 10 mg of hydroxyzine 3 times a day as needed to help you with elevated anxiety and panic attacks.  You can use 25 to 50 mg of hydroxyzine at bedtime to help you with insomnia.  You were going to start 30 mg of Cymbalta once daily, to help alleviate depression and anxiety.  It may take 3 to 4 weeks before you notice a difference with the Cymbalta.  For the spots on your foot, you will use the cream that I sent over to the pharmacy, it may take several weeks for this to resolve.  We will call you with your lab results.  Make sure your next appointment that you are fasting so we can check your cholesterol.  Please let us know if there is anything else we can do for you  Tina Rad, PA-C Physician Assistant [place] [email or web address]    Managing Anxiety, Adult After being diagnosed with an anxiety disorder, you may be relieved to know why you have felt or behaved a certain way. You may also feel overwhelmed about the treatment ahead and what it will mean for your life. With care and support, you can manage this condition and recover from it. How to manage lifestyle changes Managing stress and anxiety Stress is your body's reaction to life changes and events, both good and bad. Most stress will last just a few hours, but stress can be ongoing and can lead to more than just stress. Although stress can play a major role in anxiety, it is not the same as anxiety. Stress is usually caused by something external, such as a deadline, test, or competition. Stress normally passes after the triggering event  has ended.  Anxiety is caused by something internal, such as imagining a terrible outcome or worrying that something will go wrong that will devastate you. Anxiety often does not go away even after the triggering event is over, and it can become long-term (chronic) worry. It is important to understand the differences between stress and anxiety and to manage your stress effectively so that it does not lead to an anxious response. Talk with your health care provider or a counselor to learn more about reducing anxiety and stress. He or she may suggest tension reduction techniques, such as:  Music therapy. This can include creating or listening to music that you enjoy and that inspires you.  Mindfulness-based meditation. This involves being aware of your normal breaths while not trying to control your breathing. It can be done while sitting or walking.  Centering prayer. This involves focusing on a word, phrase, or sacred image that means something to you and brings you peace.  Deep breathing. To do this, expand your stomach and inhale slowly through your nose. Hold your breath for 3-5 seconds. Then exhale slowly, letting your stomach muscles relax.  Self-talk. This involves identifying thought patterns that lead to anxiety reactions and changing those patterns.  Muscle relaxation. This involves tensing muscles and then relaxing them. Choose a tension reduction technique that suits your lifestyle and personality. These techniques take time and practice. Set aside 5-15 minutes a day to  do them. Therapists can offer counseling and training in these techniques. The training to help with anxiety may be covered by some insurance plans. Other things you can do to manage stress and anxiety include:  Keeping a stress/anxiety diary. This can help you learn what triggers your reaction and then learn ways to manage your response.  Thinking about how you react to certain situations. You may not be able to control  everything, but you can control your response.  Making time for activities that help you relax and not feeling guilty about spending your time in this way.  Visual imagery and yoga can help you stay calm and relax.   Medicines Medicines can help ease symptoms. Medicines for anxiety include:  Anti-anxiety drugs.  Antidepressants. Medicines are often used as a primary treatment for anxiety disorder. Medicines will be prescribed by a health care provider. When used together, medicines, psychotherapy, and tension reduction techniques may be the most effective treatment. Relationships Relationships can play a big part in helping you recover. Try to spend more time connecting with trusted friends and family members. Consider going to couples counseling, taking family education classes, or going to family therapy. Therapy can help you and others better understand your condition. How to recognize changes in your anxiety Everyone responds differently to treatment for anxiety. Recovery from anxiety happens when symptoms decrease and stop interfering with your daily activities at home or work. This may mean that you will start to:  Have better concentration and focus. Worry will interfere less in your daily thinking.  Sleep better.  Be less irritable.  Have more energy.  Have improved memory. It is important to recognize when your condition is getting worse. Contact your health care provider if your symptoms interfere with home or work and you feel like your condition is not improving. Follow these instructions at home: Activity  Exercise. Most adults should do the following: ? Exercise for at least 150 minutes each week. The exercise should increase your heart rate and make you sweat (moderate-intensity exercise). ? Strengthening exercises at least twice a week.  Get the right amount and quality of sleep. Most adults need 7-9 hours of sleep each night. Lifestyle  Eat a healthy diet that  includes plenty of vegetables, fruits, whole grains, low-fat dairy products, and lean protein. Do not eat a lot of foods that are high in solid fats, added sugars, or salt.  Make choices that simplify your life.  Do not use any products that contain nicotine or tobacco, such as cigarettes, e-cigarettes, and chewing tobacco. If you need help quitting, ask your health care provider.  Avoid caffeine, alcohol, and certain over-the-counter cold medicines. These may make you feel worse. Ask your pharmacist which medicines to avoid.   General instructions  Take over-the-counter and prescription medicines only as told by your health care provider.  Keep all follow-up visits as told by your health care provider. This is important. Where to find support You can get help and support from these sources:  Self-help groups.  Online and [employer].  A trusted spiritual leader.  Couples counseling.  Family education classes.  Family therapy. Where to find more information You may find that joining a support group helps you deal with your anxiety. The following sources can help you locate counselors or support groups near you:  [place]: www.mentalhealthamerica.net  Anxiety and Depression Association of [place] (ADAA): https://www.clark.net/  National Alliance on Mental Illness (NAMI): www.nami.org Contact a health care provider if  you:  Have a hard time staying focused or finishing daily tasks.  Spend many hours a day feeling worried about everyday life.  Become exhausted by worry.  Start to have headaches, feel tense, or have nausea.  Urinate more than normal.  Have diarrhea. Get help right away if you have:  A racing heart and shortness of breath.  Thoughts of hurting yourself or others. If you ever feel like you may hurt yourself or others, or have thoughts about taking your own life, get help right away. You can go to your nearest emergency department or  call:  Your local emergency services (911 in the [place]  A suicide crisis helpline, such as the [place] at (712) 295-3438. This is open 24 hours a day. Summary  Taking steps to learn and use tension reduction techniques can help calm you and help prevent triggering an anxiety reaction.  When used together, medicines, psychotherapy, and tension reduction techniques may be the most effective treatment.  Family, friends, and partners can play a big part in helping you recover from an anxiety disorder. This information is not intended to replace advice given to you by your health care provider. Make sure you discuss any questions you have with your health care provider. Document Revised: 10/31/2018 Document Reviewed: 10/31/2018 Elsevier Patient Education  [place].

## 2020-07-15 NOTE — Progress Notes (Signed)
Patient has not taken medication today. Patient has eaten a sandwich today. Patient complains of numbing in her finger tips and right leg and foot. Patient shares ever since her stroke she has a fear of driving when it is two lanes, cars are close to her and night time. Patient reports a daily discharge.

## 2020-07-15 NOTE — Progress Notes (Signed)
Established Patient Office Visit  Subjective:  Patient ID: Tina Moss, female    DOB: 01-Jun-1971  Age: 50 y.o. MRN: [id number]  CC:  Chief Complaint  Patient presents with   SEXUALLY TRANSMITTED DISEASE   Numbness    Finger Tips and Right Toes    HPI [name] reports that she continues to have right hand and right leg and foot numbness due to sequela from her stroke.  Reports that she has been feeling very anxious about this especially when she is driving.  Reports that she has come to the point of anxiety and panic attacks while driving and has to pull over and have someone else drive.  Worried that she will not be able to feel the gas or the brake.  Reports anxiety has worsened over the last month.  Reports that she also has increased stressors, her son is incarcerated and she is unable to speak with him.  Reports that she is not sleeping well, has a hard time falling asleep and staying asleep, endorses racing thoughts.  Reports that she has been having vaginal discharge on an almost daily basis, states it occurs randomly, describes the discharge as yellow, denies odor.  Reports that she will have suprapubic discomfort a few times a week before urination.  Reports discharge present for the past year, suprapubic discomfort for past few weeks.   Past Medical History:  Diagnosis Date   Anemia    Epilepsy ([place])    Fibroids    H/O bacterial infection    H/O mumps    Hyperlipidemia    Hypertension    Seizures (H[facility])     Past Surgical History:  Procedure Laterality Date   CESAREAN SECTION     x2. at term   TEE WITHOUT CARDIOVERSION N/A 04/06/2018   Procedure: TRANSESOPHAGEAL ECHOCARDIOGRAM (TEE) BUBBLE STUDY;  Surgeon: Lelon Perla, MD;  Location: [facility] ENDOSCOPY;  Service: Cardiovascular;  Laterality: N/A;   TUBAL LIGATION     WISDOM TOOTH EXTRACTION      Family History  Problem Relation Age of Onset   Hypertension Mother    Hypertension  Sister    Arthritis Sister        knees   Hypertension Brother    Deafness Brother    Speech disorder Brother        mute   Mental illness Brother        "he can just fly off"   Mental illness Daughter        ? bipolar   Scoliosis Son     Social History   Socioeconomic History   Marital status: Divorced    Spouse name: n/a   Number of children: 2   Years of education: 11th grade   Highest education level: 11th grade  Occupational History   Occupation: Housekeeper    Comment: [employer]  Tobacco Use   Smoking status: Current Some Day Smoker    Types: Cigars   Smokeless tobacco: Never Used   Tobacco comment: "I think I just keep so much on my mind."  Vaping Use   Vaping Use: Never used  Substance and Sexual Activity   Alcohol use: Yes    Alcohol/week: 0.0 - 3.0 standard drinks    Comment: social   Drug use: Not Currently    Types: Marijuana    Comment: occasionally   Sexual activity: Yes    Partners: Male    Birth control/protection: None  Other Topics Concern   Not  on file  Social History Narrative   Lives with her daughter.   Son is currently incarcerated in [place].   She completed 11th grade, but was kicked out and never when back, as she had become pregnant and was raising her daughter.   Divorced x 2.   Social Determinants of Health   Financial Resource Strain: Not on file  Food Insecurity: Not on file  Transportation Needs: Unmet Transportation Needs   Lack of Transportation (Medical): Yes   Lack of Transportation (Non-Medical): Yes  Physical Activity: Not on file  Stress: Not on file  Social Connections: Not on file  Intimate Partner Violence: Not on file    Outpatient Medications Prior to Visit  Medication Sig Dispense Refill   meloxicam (MOBIC) 15 MG tablet Take 1 tablet (15 mg total) by mouth daily. 30 tablet 0   atorvastatin (LIPITOR) 40 MG tablet Take 1 tablet (40 mg total) by mouth daily at 6 PM. 90 tablet 1   clopidogrel  (PLAVIX) 75 MG tablet Take 1 tablet (75 mg total) by mouth daily. 30 tablet 6   ferrous sulfate 325 (65 FE) MG tablet Take 1 tablet (325 mg total) by mouth 2 (two) times daily with a meal. 60 tablet 3   medroxyPROGESTERone (PROVERA) 10 MG tablet Take 1 tablet (10 mg total) by mouth 2 (two) times daily. 60 tablet 6   metoprolol tartrate (LOPRESSOR) 50 MG tablet Take 1 tablet (50 mg total) by mouth in the morning and at bedtime. 60 tablet 6   tiZANidine (ZANAFLEX) 4 MG tablet Take 1 tablet (4 mg total) by mouth every 8 (eight) hours as needed for muscle spasms. 30 tablet 0   acetaminophen-codeine (TYLENOL #3) 300-30 MG tablet Take 1 tablet by mouth every 12 (twelve) hours as needed for moderate pain. (Patient not taking: Reported on 07/15/2020) 20 tablet 0   benzonatate (TESSALON) 100 MG capsule Take 1 capsule (100 mg total) by mouth every 8 (eight) hours. 21 capsule 0   predniSONE (DELTASONE) 10 MG tablet Take 4 tablets daily for 5 days then stop 20 tablet 0   No facility-administered medications prior to visit.    No Known Allergies  ROS Review of Systems  Constitutional: Negative for chills and fever.  HENT: Negative.   Eyes: Negative.   Respiratory: Negative.   Cardiovascular: Positive for palpitations.  Gastrointestinal: Negative.   Endocrine: Negative.   Genitourinary: Positive for vaginal discharge. Negative for dysuria and genital sores.  Musculoskeletal: Negative.   Skin: Negative.   Allergic/Immunologic: Negative.   Neurological: Positive for numbness.  Hematological: Negative.   Psychiatric/Behavioral: Positive for sleep disturbance. Negative for self-injury and suicidal ideas. The patient is nervous/anxious.       Objective:    Physical Exam Vitals and nursing note reviewed.  Constitutional:      General: She is not in acute distress.    Appearance: Normal appearance. She is not ill-appearing.  HENT:     Head: Normocephalic and atraumatic.     Right Ear:  External ear normal.     Left Ear: External ear normal.     Mouth/Throat:     Mouth: Mucous membranes are moist.     Pharynx: Oropharynx is clear.  Cardiovascular:     Rate and Rhythm: Normal rate and regular rhythm.     Pulses: Normal pulses.     Heart sounds: Normal heart sounds.  Pulmonary:     Effort: Pulmonary effort is normal.     Breath sounds: Normal  breath sounds.  Musculoskeletal:        General: Normal range of motion.     Cervical back: Normal range of motion and neck supple.  Skin:    General: Skin is warm and dry.       Neurological:     General: No focal deficit present.     Mental Status: She is alert and oriented to person, place, and time. Mental status is at baseline.     Motor: Weakness present.     Comments: Right side arm and leg weakness  Psychiatric:        Mood and Affect: Mood normal.        Behavior: Behavior normal.        Thought Content: Thought content normal.        Judgment: Judgment normal.     BP (!) 180/105 (BP Location: Left Arm, Patient Position: Sitting, Cuff Size: Normal)    Pulse 84    Temp (!) 97.2 F (36.2 C) (Oral)    Resp 18    Ht 5\' 7"  (1.702 m)    Wt 178 lb (80.7 kg)    LMP 04/28/2020    SpO2 98%    BMI 27.88 kg/m  Wt Readings from Last 3 Encounters:  07/15/20 178 lb (80.7 kg)  09/14/19 183 lb (83 kg)  09/09/19 183 lb (83 kg)     Health Maintenance Due  Topic Date Due   Hepatitis C Screening  Never done   COLONOSCOPY (Pts 45-61yrs Insurance coverage will need to be confirmed)  Never done   MAMMOGRAM  06/15/2019   PAP SMEAR-Modifier  12/23/2019    There are no preventive care reminders to display for this patient.  Lab Results  Component Value Date   TSH 0.797 07/15/2020   Lab Results  Component Value Date   WBC 2.9 (L) 07/15/2020   HGB 13.3 07/15/2020   HCT 40.3 07/15/2020   MCV 89 07/15/2020   PLT 219 07/15/2020   Lab Results  Component Value Date   NA 140 07/15/2020   K 3.7 07/15/2020   CO2 23  09/17/2019   GLUCOSE 102 (H) 07/15/2020   BUN 10 07/15/2020   CREATININE 0.57 07/15/2020   BILITOT 0.5 07/15/2020   ALKPHOS 128 (H) 07/15/2020   AST 18 07/15/2020   ALT 10 09/17/2019   PROT 7.3 07/15/2020   ALBUMIN 4.4 07/15/2020   CALCIUM 9.5 07/15/2020   ANIONGAP 7 01/15/2019   Lab Results  Component Value Date   CHOL 205 (H) 09/17/2019   Lab Results  Component Value Date   HDL 54 09/17/2019   Lab Results  Component Value Date   LDLCALC 143 (H) 09/17/2019   Lab Results  Component Value Date   TRIG 47 09/17/2019   Lab Results  Component Value Date   CHOLHDL 3.8 09/17/2019   Lab Results  Component Value Date   HGBA1C 5.9 (H) 04/05/2018      Assessment & Plan:   Problem List Items Addressed This Visit      Cardiovascular and Mediastinum   Essential hypertension   Relevant Medications   atorvastatin (LIPITOR) 40 MG tablet   metoprolol tartrate (LOPRESSOR) 50 MG tablet   Other Relevant Orders   Comp. Metabolic Panel (12) (Completed)     Other   Anemia   Relevant Medications   ferrous sulfate 325 (65 FE) MG tablet   Other Relevant Orders   CBC with Differential/Platelet (Completed)   Hyperlipidemia LDL goal <100 -  Primary   Relevant Medications   atorvastatin (LIPITOR) 40 MG tablet   metoprolol tartrate (LOPRESSOR) 50 MG tablet    Other Visit Diagnoses    Left middle cerebral artery stroke (HCC)       Relevant Medications   atorvastatin (LIPITOR) 40 MG tablet   clopidogrel (PLAVIX) 75 MG tablet   metoprolol tartrate (LOPRESSOR) 50 MG tablet   DUB (dysfunctional uterine bleeding)       Relevant Medications   ferrous sulfate 325 (65 FE) MG tablet   medroxyPROGESTERone (PROVERA) 10 MG tablet   Other Relevant Orders   POCT URINALYSIS DIP (CLINITEK) (Completed)   Cervicovaginal ancillary only (Completed)   Tinea pedis of right foot       Relevant Medications   ketoconazole (NIZORAL) 2 % cream   Anxiety state       Relevant Medications    DULoxetine (CYMBALTA) 30 MG capsule   hydrOXYzine (ATARAX/VISTARIL) 10 MG tablet   hydrOXYzine (ATARAX/VISTARIL) 50 MG tablet   Other Relevant Orders   TSH (Completed)   Vitamin D deficiency       Relevant Orders   Vitamin D, 25-hydroxy (Completed)   Other chronic pain       Relevant Medications   DULoxetine (CYMBALTA) 30 MG capsule   tiZANidine (ZANAFLEX) 4 MG tablet      Meds ordered this encounter  Medications   atorvastatin (LIPITOR) 40 MG tablet    Sig: Take 1 tablet (40 mg total) by mouth daily at 6 PM.    Dispense:  90 tablet    Refill:  1    Order Specific Question:   Supervising Provider    Answer:   Elsie Stain [[id number]]   clopidogrel (PLAVIX) 75 MG tablet    Sig: Take 1 tablet (75 mg total) by mouth daily.    Dispense:  30 tablet    Refill:  6    Order Specific Question:   Supervising Provider    Answer:   Asencion Noble E [[id number]]   ferrous sulfate 325 (65 FE) MG tablet    Sig: Take 1 tablet (325 mg total) by mouth 2 (two) times daily with a meal.    Dispense:  60 tablet    Refill:  3    Order Specific Question:   Supervising Provider    Answer:   Asencion Noble E [[id number]]   metoprolol tartrate (LOPRESSOR) 50 MG tablet    Sig: Take 1 tablet (50 mg total) by mouth in the morning and at bedtime.    Dispense:  60 tablet    Refill:  6    Order Specific Question:   Supervising Provider    Answer:   Asencion Noble E [[id number]]   DULoxetine (CYMBALTA) 30 MG capsule    Sig: Take 1 capsule (30 mg total) by mouth daily.    Dispense:  30 capsule    Refill:  1    Order Specific Question:   Supervising Provider    Answer:   Asencion Noble E [[id number]]   hydrOXYzine (ATARAX/VISTARIL) 10 MG tablet    Sig: Take 1 tablet (10 mg total) by mouth 3 (three) times daily as needed.    Dispense:  30 tablet    Refill:  0    Order Specific Question:   Supervising Provider    Answer:   Asencion Noble E [[id number]]   hydrOXYzine (ATARAX/VISTARIL) 50 MG tablet    Sig: Take 1/2 - 1  full tab PO QHS PRN for insomnia  Dispense:  30 tablet    Refill:  0    Order Specific Question:   Supervising Provider    Answer:   Elsie Stain [[id number]]   ketoconazole (NIZORAL) 2 % cream    Sig: Apply 1 application topically daily.    Dispense:  15 g    Refill:  0    Order Specific Question:   Supervising Provider    Answer:   Elsie Stain [[id number]]   medroxyPROGESTERone (PROVERA) 10 MG tablet    Sig: Take 1 tablet (10 mg total) by mouth 2 (two) times daily.    Dispense:  60 tablet    Refill:  6    Order Specific Question:   Supervising Provider    Answer:   Asencion Noble E [[id number]]   tiZANidine (ZANAFLEX) 4 MG tablet    Sig: Take 1 tablet (4 mg total) by mouth every 8 (eight) hours as needed for muscle spasms.    Dispense:  30 tablet    Refill:  0    Order Specific Question:   Supervising Provider    Answer:   Joya Gaskins, PATRICK E [[id number]]  1. Hyperlipidemia LDL goal <100   2. Essential hypertension Blood pressure elevated in office today, encourage patient to check blood pressure at home, keep a written log and bring to next office visit for further review.  Patient to follow-up with mobile medicine unit in 3 weeks - Comp. Metabolic Panel (12) - metoprolol tartrate (LOPRESSOR) 50 MG tablet; Take 1 tablet (50 mg total) by mouth in the morning and at bedtime.  Dispense: 60 tablet; Refill: 6  3. Iron deficiency anemia due to chronic blood loss Continue current regimen - CBC with Differential/Platelet - ferrous sulfate 325 (65 FE) MG tablet; Take 1 tablet (325 mg total) by mouth 2 (two) times daily with a meal.  Dispense: 60 tablet; Refill: 3  4. Left middle cerebral artery stroke (HCC) Continue current regimen - atorvastatin (LIPITOR) 40 MG tablet; Take 1 tablet (40 mg total) by mouth daily at 6 PM.  Dispense: 90 tablet; Refill: 1 - clopidogrel (PLAVIX) 75 MG tablet; Take 1 tablet (75 mg total) by mouth daily.  Dispense: 30 tablet; Refill: 6  5. DUB (dysfunctional  uterine bleeding) Continue current regimen - ferrous sulfate 325 (65 FE) MG tablet; Take 1 tablet (325 mg total) by mouth 2 (two) times daily with a meal.  Dispense: 60 tablet; Refill: 3 - medroxyPROGESTERone (PROVERA) 10 MG tablet; Take 1 tablet (10 mg total) by mouth 2 (two) times daily.  Dispense: 60 tablet; Refill: 6 - POCT URINALYSIS DIP (CLINITEK) - Cervicovaginal ancillary only  6. Tinea pedis of right foot Continue current regimen - ketoconazole (NIZORAL) 2 % cream; Apply 1 application topically daily.  Dispense: 15 g; Refill: 0  7. Anxiety state Trial Cymbalta, trial hydroxyzine 3 times a day as needed, increase dose at bedtime for insomnia. - TSH - DULoxetine (CYMBALTA) 30 MG capsule; Take 1 capsule (30 mg total) by mouth daily.  Dispense: 30 capsule; Refill: 1 - hydrOXYzine (ATARAX/VISTARIL) 10 MG tablet; Take 1 tablet (10 mg total) by mouth 3 (three) times daily as needed.  Dispense: 30 tablet; Refill: 0 - hydrOXYzine (ATARAX/VISTARIL) 50 MG tablet; Take 1/2 - 1 full tab PO QHS PRN for insomnia  Dispense: 30 tablet; Refill: 0  8. Vitamin D deficiency  - Vitamin D, 25-hydroxy  9. Other chronic pain Continue current regimen - tiZANidine (ZANAFLEX) 4 MG tablet; Take 1 tablet (4 mg total)  by mouth every 8 (eight) hours as needed for muscle spasms.  Dispense: 30 tablet; Refill: 0   I have reviewed the patient's medical history (PMH, PSH, Social History, Family History, Medications, and allergies) , and have been updated if relevant. I spent 30 minutes reviewing chart and  face to face time with patient.     Follow-up: Return in about 3 weeks (around 08/05/2020).    Loraine Grip Mayers, PA-C

## 2020-07-16 LAB — CBC WITH DIFFERENTIAL/PLATELET
Basophils Absolute: 0 10*3/uL (ref 0.0–0.2)
Basos: 1 %
EOS (ABSOLUTE): 0 10*3/uL (ref 0.0–0.4)
Eos: 1 %
Hematocrit: 40.3 % (ref 34.0–46.6)
Hemoglobin: 13.3 g/dL (ref 11.1–15.9)
Immature Grans (Abs): 0 10*3/uL (ref 0.0–0.1)
Immature Granulocytes: 0 %
Lymphocytes Absolute: 0.8 10*3/uL (ref 0.7–3.1)
Lymphs: 29 %
MCH: 29.4 pg (ref 26.6–33.0)
MCHC: 33 g/dL (ref 31.5–35.7)
MCV: 89 fL (ref 79–97)
Monocytes Absolute: 0.3 10*3/uL (ref 0.1–0.9)
Monocytes: 10 %
Neutrophils Absolute: 1.7 10*3/uL (ref 1.4–7.0)
Neutrophils: 59 %
Platelets: 219 10*3/uL (ref 150–450)
RBC: 4.53 x10E6/uL (ref 3.77–5.28)
RDW: 14.7 % (ref 11.7–15.4)
WBC: 2.9 10*3/uL — ABNORMAL LOW (ref 3.4–10.8)

## 2020-07-16 LAB — CERVICOVAGINAL ANCILLARY ONLY
Bacterial Vaginitis (gardnerella): POSITIVE — AB
Candida Glabrata: NEGATIVE
Candida Vaginitis: NEGATIVE
Chlamydia: NEGATIVE
Comment: NEGATIVE
Comment: NEGATIVE
Comment: NEGATIVE
Comment: NEGATIVE
Comment: NEGATIVE
Comment: NORMAL
Neisseria Gonorrhea: NEGATIVE
Trichomonas: NEGATIVE

## 2020-07-16 LAB — COMP. METABOLIC PANEL (12)
AST: 18 IU/L (ref 0–40)
Albumin/Globulin Ratio: 1.5 (ref 1.2–2.2)
Albumin: 4.4 g/dL (ref 3.8–4.9)
Alkaline Phosphatase: 128 IU/L — ABNORMAL HIGH (ref 44–121)
BUN/Creatinine Ratio: 18 (ref 9–23)
BUN: 10 mg/dL (ref 6–24)
Bilirubin Total: 0.5 mg/dL (ref 0.0–1.2)
Calcium: 9.5 mg/dL (ref 8.7–10.2)
Chloride: 105 mmol/L (ref 96–106)
Creatinine, Ser: 0.57 mg/dL (ref 0.57–1.00)
GFR calc Af Amer: 124 mL/min/{1.73_m2} (ref >59)
GFR calc non Af Amer: 108 mL/min/{1.73_m2} (ref >59)
Globulin, Total: 2.9 g/dL (ref 1.5–4.5)
Glucose: 102 mg/dL — ABNORMAL HIGH (ref 65–99)
Potassium: 3.7 mmol/L (ref 3.5–5.2)
Sodium: 140 mmol/L (ref 134–144)
Total Protein: 7.3 g/dL (ref 6.0–8.5)

## 2020-07-16 LAB — VITAMIN D 25 HYDROXY (VIT D DEFICIENCY, FRACTURES): Vit D, 25-Hydroxy: 14.9 ng/mL — ABNORMAL LOW (ref 30.0–100.0)

## 2020-07-16 LAB — TSH: TSH: 0.797 u[IU]/mL (ref 0.450–4.500)

## 2020-07-17 ENCOUNTER — Telehealth: Payer: Self-pay | Admitting: *Deleted

## 2020-07-17 ENCOUNTER — Other Ambulatory Visit: Payer: Self-pay | Admitting: Physician Assistant

## 2020-07-17 DIAGNOSIS — F411 Generalized anxiety disorder: Secondary | ICD-10-CM | POA: Insufficient documentation

## 2020-07-17 DIAGNOSIS — D72819 Decreased white blood cell count, unspecified: Secondary | ICD-10-CM | POA: Insufficient documentation

## 2020-07-17 DIAGNOSIS — E559 Vitamin D deficiency, unspecified: Secondary | ICD-10-CM | POA: Insufficient documentation

## 2020-07-17 DIAGNOSIS — I63512 Cerebral infarction due to unspecified occlusion or stenosis of left middle cerebral artery: Secondary | ICD-10-CM | POA: Insufficient documentation

## 2020-07-17 MED ORDER — METRONIDAZOLE 500 MG PO TABS: 500.0000 mg | ORAL_TABLET | Freq: Two times a day (BID) | ORAL | 0 refills | Status: AC

## 2020-07-17 MED ORDER — VITAMIN D (ERGOCALCIFEROL) 1.25 MG (50000 UNIT) PO CAPS: 50000.0000 [IU] | ORAL_CAPSULE | ORAL | 2 refills | Status: DC

## 2020-07-17 MED FILL — metroNIDAZOLE 500 MG TABS: 500 | 7 days supply | Qty: 14 | Fill #0

## 2020-07-17 MED FILL — VIT D2 1.25 MG (50,000 UNIT: 1.25 MG | 84 days supply | Qty: 12 | Fill #0

## 2020-07-17 NOTE — Telephone Encounter (Signed)
Patient verified DOB Patient is aware of BV being present and needing to complete a week of flagyl. Patient is also aware of WBC being decreased along with 1 slightly elevated liver enzyme. Patient is aware of kidney and thyroid being normal. Patient is aware of taking vitamin d level once a week and a rechecked completed in 3 months.

## 2020-07-17 NOTE — Telephone Encounter (Signed)
-----   Message from Kennieth Rad, [place] sent at 07/17/2020  8:34 AM EST ----- Call patient and let her know that her bacterial swab was positive for bacterial vaginitis, she will take Flagyl twice a day for 7 days prescription sent to her pharmacy.  Her white blood cells were decreased, we will recheck this at her next office visit.  She did have 1 slightly elevated liver enzyme, this is more than likely due to her cholesterol medication, not concerning at this time.  Kidney function and thyroid function within normal limits.  Her vitamin D levels are very low, she will take 50,000 units once a week for the next 12 weeks.

## 2020-07-17 NOTE — Addendum Note (Signed)
Addended by: Kennieth Rad on: 07/17/2020 08:34 AM   Modules accepted: Orders

## 2020-07-22 MED FILL — ATORVASTATIN CALCIUM 40 MG: 40 | 30 days supply | Qty: 30 | Fill #0

## 2020-07-22 MED FILL — hydrOXYzine HCL 50 MG TABS: 50 | 30 days supply | Qty: 30 | Fill #0

## 2020-07-22 MED FILL — hydrOXYzine HCL 10 MG TABS: 10 | 10 days supply | Qty: 30 | Fill #0

## 2020-07-22 MED FILL — VIT D2 1.25 MG (50,000 UNIT: 1.25 MG | 84 days supply | Qty: 12 | Fill #0

## 2020-07-22 MED FILL — tiZANidine HCL 4 MG TABS: 4 | 10 days supply | Qty: 30 | Fill #0

## 2020-07-22 MED FILL — MEDROXYPROGESTERONE ACETATE: 10 | 30 days supply | Qty: 60 | Fill #0

## 2020-07-22 MED FILL — metroNIDAZOLE 500 MG TABS: 500 | 7 days supply | Qty: 14 | Fill #0

## 2020-07-22 MED FILL — DULoxetine HCL 30 MG CPEP: 30 | 30 days supply | Qty: 30 | Fill #0

## 2020-07-22 MED FILL — FERROUS SULFATE 325 MG TAB: 325 (65 FE) | 30 days supply | Qty: 60 | Fill #0

## 2020-07-22 MED FILL — CLOPIDOGREL 75 MG TABLET: 75 | 30 days supply | Qty: 30 | Fill #0

## 2020-07-22 MED FILL — METOPROLOL TARTRATE 50 MG T: 50 | 30 days supply | Qty: 60 | Fill #0

## 2020-07-31 ENCOUNTER — Telehealth: Payer: Self-pay

## 2020-07-31 NOTE — Telephone Encounter (Signed)
Contacted patient by phone to share location for upcoming visit with [facility]. LVM for patient to contact the unit for questions

## 2020-08-05 ENCOUNTER — Ambulatory Visit: Payer: Self-pay

## 2020-12-26 ENCOUNTER — Ambulatory Visit: Payer: Self-pay | Admitting: Nurse Practitioner

## 2021-01-28 ENCOUNTER — Telehealth: Payer: Self-pay

## 2021-01-28 NOTE — Telephone Encounter (Signed)
Patient will need an appointment to discuss this. She was last seen in our office 11/01/2019.

## 2021-01-28 NOTE — Telephone Encounter (Signed)
Copied from [place] [id number]. Topic: General - Other >> Jan 23, 2021  9:19 AM Tina Moss wrote: Reason for CRM: Pt stated she is currently unemployed and she would like to know if she could receive samples of her medication or if she could receive financial assistance with getting them. Pt also stated that she needs a printout of all the medications that she currently takes. Pt requests call back

## 2021-02-09 ENCOUNTER — Encounter: Payer: Self-pay | Admitting: Nurse Practitioner

## 2021-02-09 ENCOUNTER — Ambulatory Visit: Payer: Self-pay | Attending: Nurse Practitioner | Admitting: Nurse Practitioner

## 2021-02-09 ENCOUNTER — Other Ambulatory Visit: Payer: Self-pay

## 2021-02-09 VITALS — BP 138/91 | HR 99 | Ht 67.0 in | Wt 191.4 lb

## 2021-02-09 DIAGNOSIS — F32A Depression, unspecified: Secondary | ICD-10-CM

## 2021-02-09 DIAGNOSIS — F419 Anxiety disorder, unspecified: Secondary | ICD-10-CM

## 2021-02-09 DIAGNOSIS — Z8673 Personal history of transient ischemic attack (TIA), and cerebral infarction without residual deficits: Secondary | ICD-10-CM

## 2021-02-09 DIAGNOSIS — Z1211 Encounter for screening for malignant neoplasm of colon: Secondary | ICD-10-CM

## 2021-02-09 DIAGNOSIS — D72819 Decreased white blood cell count, unspecified: Secondary | ICD-10-CM

## 2021-02-09 DIAGNOSIS — E785 Hyperlipidemia, unspecified: Secondary | ICD-10-CM

## 2021-02-09 DIAGNOSIS — D500 Iron deficiency anemia secondary to blood loss (chronic): Secondary | ICD-10-CM

## 2021-02-09 DIAGNOSIS — M2550 Pain in unspecified joint: Secondary | ICD-10-CM

## 2021-02-09 DIAGNOSIS — Z1159 Encounter for screening for other viral diseases: Secondary | ICD-10-CM

## 2021-02-09 DIAGNOSIS — I10 Essential (primary) hypertension: Secondary | ICD-10-CM

## 2021-02-09 MED ORDER — TRAMADOL HCL 50 MG PO TABS
50.0000 mg | ORAL_TABLET | Freq: Three times a day (TID) | ORAL | 0 refills | Status: AC | PRN
  Filled 2021-02-09: qty 30, 10d supply, fill #0

## 2021-02-09 MED ORDER — TIZANIDINE HCL 4 MG PO TABS
4.0000 mg | ORAL_TABLET | Freq: Three times a day (TID) | ORAL | 1 refills | Status: AC | PRN
  Filled 2021-02-09: qty 30, 10d supply, fill #0

## 2021-02-09 MED ORDER — HYDROXYZINE HCL 10 MG PO TABS
10.0000 mg | ORAL_TABLET | Freq: Three times a day (TID) | ORAL | 1 refills | Status: DC | PRN
  Filled 2021-02-09: qty 60, 20d supply, fill #0

## 2021-02-09 MED ORDER — DULOXETINE HCL 60 MG PO CPEP
60.0000 mg | ORAL_CAPSULE | Freq: Every day | ORAL | 1 refills | Status: DC
  Filled 2021-02-09: qty 30, 30d supply, fill #0

## 2021-02-09 MED ORDER — METOPROLOL TARTRATE 50 MG PO TABS
50.0000 mg | ORAL_TABLET | Freq: Two times a day (BID) | ORAL | 3 refills | Status: DC
  Filled 2021-02-09: qty 60, 30d supply, fill #0

## 2021-02-09 MED ORDER — ATORVASTATIN CALCIUM 40 MG PO TABS
40.0000 mg | ORAL_TABLET | Freq: Every day | ORAL | 1 refills | Status: DC
  Filled 2021-02-09: qty 30, 30d supply, fill #0

## 2021-02-09 MED ORDER — FERROUS SULFATE 325 (65 FE) MG PO TABS
ORAL_TABLET | ORAL | 3 refills | Status: DC
  Filled 2021-02-09: qty 60, 30d supply, fill #0

## 2021-02-09 MED ORDER — CLOPIDOGREL BISULFATE 75 MG PO TABS
75.0000 mg | ORAL_TABLET | Freq: Every day | ORAL | 0 refills | Status: AC
  Filled 2021-02-09 – 2021-12-28 (×3): qty 30, 30d supply, fill #0

## 2021-02-09 NOTE — Progress Notes (Signed)
Right leg swelling  Headache everyday for 2 weeks  Sore on both feet

## 2021-02-09 NOTE — Progress Notes (Signed)
Assessment & Plan:  Tina Moss was seen today for leg pain and headache.  Diagnoses and all orders for this visit:  Primary hypertension -     metoprolol tartrate (LOPRESSOR) 50 MG tablet; Take 1 tablet (50 mg total) by mouth 2 (two) times daily. -     CMP14+EGFR Continue all antihypertensives as prescribed.  Remember to bring in your blood pressure log with you for your follow up appointment.  DASH/Mediterranean Diets are healthier choices for HTN.    Colon cancer screening -     Fecal occult blood, imunochemical([facility])  Anxiety state -     DULoxetine (CYMBALTA) 60 MG capsule; Take 1 capsule (60 mg total) by mouth daily. -     hydrOXYzine (ATARAX/VISTARIL) 10 MG tablet; Take 1 tablet (10 mg total) by mouth 3 (three) times daily as needed.  Dyslipidemia, goal LDL below 100 -     Lipid panel INSTRUCTIONS: Work on a low fat, heart healthy diet and participate in regular aerobic exercise program by working out at least 150 minutes per week; 5 days a week-30 minutes per day. Avoid red meat/beef/steak,  fried foods. junk foods, sodas, sugary drinks, unhealthy snacking, alcohol and smoking.  Drink at least 80 oz of water per day and monitor your carbohydrate intake daily.    Leukopenia, unspecified type -     CBC with Differential  Need for hepatitis C screening test -     HCV Ab w Reflex to Quant PCR  Arthralgia of multiple joints -     DULoxetine (CYMBALTA) 60 MG capsule; Take 1 capsule (60 mg total) by mouth daily. -     tiZANidine (ZANAFLEX) 4 MG tablet; Take 1 tablet (4 mg total) by mouth every 8 (eight) hours as needed for muscle spasms. -     traMADol (ULTRAM) 50 MG tablet; Take 1 tablet (50 mg total) by mouth every 8 (eight) hours as needed. Work on losing weight to help reduce joint pain. May alternate with heat and ice application for pain relief. May also alternate with acetaminophen and  as prescribed pain relief. Other alternatives include massage, acupuncture and  water aerobics.  You must stay active and avoid a sedentary lifestyle.   Iron deficiency anemia due to chronic blood loss -     ferrous sulfate 325 (65 FE) MG tablet; TAKE 1 TABLET (325 MG TOTAL) BY MOUTH 2 (TWO) TIMES DAILY WITH A MEAL. -     CBC with Differential  Hyperlipidemia LDL goal <100 -     atorvastatin (LIPITOR) 40 MG tablet; Take 1 tablet (40 mg total) by mouth daily. at 6pm. NEEDS PASS -     Lipid panel INSTRUCTIONS: Work on a low fat, heart healthy diet and participate in regular aerobic exercise program by working out at least 150 minutes per week; 5 days a week-30 minutes per day. Avoid red meat/beef/steak,  fried foods. junk foods, sodas, sugary drinks, unhealthy snacking, alcohol and smoking.  Drink at least 80 oz of water per day and monitor your carbohydrate intake daily.    History of Left middle cerebral artery stroke -     clopidogrel (PLAVIX) 75 MG tablet; Take 1 tablet (75 mg total) by mouth daily   Patient has been counseled on age-appropriate routine health concerns for screening and prevention. These are reviewed and up-to-date. Referrals have been placed accordingly. Immunizations are up-to-date or declined.    Subjective:   Chief Complaint  Patient presents with   Leg Pain   Headache  HPI Tina Moss 50 y.o. female presents to office today for follow up to HTN and with complaints of sores on bilateral feet and chronic arthralgias of the hips and knees R>L. She is not currently taking any of her medications as she states can not afford them.  She has not taken her medications in a month. Patient has been advised to apply for financial assistance and schedule to see our financial counselor.     She has a past medical history of Anemia, Epilepsy ( no seizures in 7 years and does not take anti seizure medication), Fibroids, Hyperlipidemia, Hypertension, Stroke, Anxiety and Depression, and Seizures ([place]).   On physical exam she has no visible sores on  bilateral feet. Feet do appear very dry with plantar fissures observed. Recommended vaseline, eucerin,  or aquaphor applied to feet BID.    Arthralgias She has pain in the bilateral hips and knees. Aggravating factors: stair climbing,  prolonged sitting or standing or lying down "the wrong way". She has been referred to pain management and physical therapy in the past  (2020) however she requested to stop physical therapy. Can not recall why. She had arthrocentesis in the past as well. Denies any injury or trauma inciting events.    Neuropathy Feels intermittent pin and needles sensation in her bilateral legs and arms. She has been prescribed cymbalta 66m in the past however she has not been taking this.    Depression and Anxiety She does endorse worsening anxiety when she is driving. She is not taking hydroxyzine as prescribed.    Review of Systems  Constitutional:  Negative for fever, malaise/fatigue and weight loss.  HENT: Negative.  Negative for nosebleeds.   Eyes: Negative.  Negative for blurred vision, double vision and photophobia.  Respiratory: Negative.  Negative for cough and shortness of breath.   Cardiovascular: Negative.  Negative for chest pain, palpitations and leg swelling.  Gastrointestinal: Negative.  Negative for heartburn, nausea and vomiting.  Musculoskeletal:  Positive for joint pain and myalgias. Negative for falls.  Skin:        SEE HPI  Neurological:  Positive for sensory change. Negative for dizziness, focal weakness, seizures and headaches.  Psychiatric/Behavioral:  Positive for depression. Negative for suicidal ideas. The patient is nervous/anxious and has insomnia.    Past Medical History:  Diagnosis Date   Anemia    Epilepsy ([place]    Fibroids    H/O bacterial infection    H/O mumps    Hyperlipidemia    Hypertension    Seizures ([place]     Past Surgical History:  Procedure Laterality Date   CESAREAN SECTION     x2. at term   TEE WITHOUT  CARDIOVERSION N/A 04/06/2018   Procedure: TRANSESOPHAGEAL ECHOCARDIOGRAM (TEE) BUBBLE STUDY;  Surgeon: CLelon Perla MD;  [facility];  Service: Cardiovascular;  Laterality: N/A;   TUBAL LIGATION     WISDOM TOOTH EXTRACTION      Family History  Problem Relation Age of Onset   Hypertension Mother    Hypertension Sister    Arthritis Sister        knees   Hypertension Brother    Deafness Brother    Speech disorder Brother        mute   Mental illness Brother        "he can just fly off"   Mental illness Daughter        ? bipolar   Scoliosis Son     Social  History Reviewed with no changes to be made today.   Outpatient Medications Prior to Visit  Medication Sig Dispense Refill   ketoconazole (NIZORAL) 2 % cream APPLY 1 APPLICATION TOPICALLY DAILY. 15 g 0   medroxyPROGESTERone (PROVERA) 10 MG tablet TAKE 1 TABLET (10 MG TOTAL) BY MOUTH 2 (TWO) TIMES DAILY. 60 tablet 6   meloxicam (MOBIC) 15 MG tablet Take 1 tablet (15 mg total) by mouth daily. 30 tablet 0   Vitamin D, Ergocalciferol, (DRISDOL) 1.25 MG (50000 UNIT) CAPS capsule TAKE 1 CAPSULE (50,000 UNITS TOTAL) BY MOUTH EVERY 7 (SEVEN) DAYS. 4 capsule 2   atorvastatin (LIPITOR) 40 MG tablet Take 1 tablet (40 mg total) by mouth daily at 6 PM. 90 tablet 1   atorvastatin (LIPITOR) 40 MG tablet TAKE 1 TABLET (40 MG TOTAL) BY MOUTH DAILY AT 6 PM. 90 tablet 1   clopidogrel (PLAVIX) 75 MG tablet Take 1 tablet (75 mg total) by mouth daily. 30 tablet 6   clopidogrel (PLAVIX) 75 MG tablet TAKE 1 TABLET (75 MG TOTAL) BY MOUTH DAILY. 30 tablet 6   DULoxetine (CYMBALTA) 30 MG capsule Take 1 capsule (30 mg total) by mouth daily. 30 capsule 1   DULoxetine (CYMBALTA) 30 MG capsule TAKE 1 CAPSULE (30 MG TOTAL) BY MOUTH DAILY. 30 capsule 1   ferrous sulfate 325 (65 FE) MG tablet Take 1 tablet (325 mg total) by mouth 2 (two) times daily with a meal. 60 tablet 3   ferrous sulfate 325 (65 FE) MG tablet TAKE 1 TABLET (325 MG TOTAL) BY  MOUTH 2 (TWO) TIMES DAILY WITH A MEAL. 60 tablet 3   hydrOXYzine (ATARAX/VISTARIL) 10 MG tablet Take 1 tablet (10 mg total) by mouth 3 (three) times daily as needed. 30 tablet 0   hydrOXYzine (ATARAX/VISTARIL) 10 MG tablet TAKE 1 TABLET (10 MG TOTAL) BY MOUTH 3 (THREE) TIMES DAILY AS NEEDED. 30 tablet 0   hydrOXYzine (ATARAX/VISTARIL) 50 MG tablet Take 1/2 - 1 full tab PO QHS PRN for insomnia 30 tablet 0   hydrOXYzine (ATARAX/VISTARIL) 50 MG tablet TAKE 1/2-1 TABLET BY MOUTH AT BEDTIME AS NEEDED FOR INSOMNIA 30 tablet 0   ketoconazole (NIZORAL) 2 % cream Apply 1 application topically daily. 15 g 0   medroxyPROGESTERone (PROVERA) 10 MG tablet Take 1 tablet (10 mg total) by mouth 2 (two) times daily. 60 tablet 6   metoprolol tartrate (LOPRESSOR) 50 MG tablet Take 1 tablet (50 mg total) by mouth in the morning and at bedtime. 60 tablet 6   metoprolol tartrate (LOPRESSOR) 50 MG tablet TAKE 1 TABLET (50 MG TOTAL) BY MOUTH IN THE MORNING AND AT BEDTIME. 60 tablet 6   metroNIDAZOLE (FLAGYL) 500 MG tablet TAKE 1 TABLET (500 MG TOTAL) BY MOUTH 2 (TWO) TIMES DAILY FOR 7 DAYS. 14 tablet 0   tiZANidine (ZANAFLEX) 4 MG tablet Take 1 tablet (4 mg total) by mouth every 8 (eight) hours as needed for muscle spasms. 30 tablet 0   tiZANidine (ZANAFLEX) 4 MG tablet TAKE 1 TABLET (4 MG TOTAL) BY MOUTH EVERY 8 (EIGHT) HOURS AS NEEDED FOR MUSCLE SPASMS. 30 tablet 0   Vitamin D, Ergocalciferol, (DRISDOL) 1.25 MG (50000 UNIT) CAPS capsule Take 1 capsule (50,000 Units total) by mouth every 7 (seven) days. 4 capsule 2   No facility-administered medications prior to visit.    No Known Allergies     Objective:    BP (!) 138/91   Pulse 99   Ht 5' 7"  (1.702 m)  Wt 191 lb 6 oz (86.8 kg)   SpO2 96%   BMI 29.97 kg/m  Wt Readings from Last 3 Encounters:  02/09/21 191 lb 6 oz (86.8 kg)  07/15/20 178 lb (80.7 kg)  09/14/19 183 lb (83 kg)    Physical Exam Vitals and nursing note reviewed.  Constitutional:       Appearance: She is well-developed.  HENT:     Head: Normocephalic and atraumatic.  Cardiovascular:     Rate and Rhythm: Normal rate and regular rhythm.     Heart sounds: Normal heart sounds. No murmur heard.   No friction rub. No gallop.  Pulmonary:     Effort: Pulmonary effort is normal. No tachypnea or respiratory distress.     Breath sounds: Normal breath sounds. No decreased breath sounds, wheezing, rhonchi or rales.  Chest:     Chest wall: No tenderness.  Abdominal:     General: Bowel sounds are normal.     Palpations: Abdomen is soft.  Musculoskeletal:        General: Normal range of motion.     Cervical back: Normal range of motion.     Right knee: Swelling present. Normal range of motion.  Skin:    General: Skin is warm and dry.  Neurological:     Mental Status: She is alert and oriented to person, place, and time.     Coordination: Coordination normal.  Psychiatric:        Behavior: Behavior normal. Behavior is cooperative.        Thought Content: Thought content normal.        Judgment: Judgment normal.         Patient has been counseled extensively about nutrition and exercise as well as the importance of adherence with medications and regular follow-up. The patient was given clear instructions to go to ER or return to medical center if symptoms don't improve, worsen or new problems develop. The patient verbalized understanding.   Follow-up: Return in about 3 months (around 05/12/2021) for FOLLOW UP WITH PCP , HTN.   Gildardo Pounds, FNP-BC [facility] [place], [place]   02/09/2021, 12:33 PM

## 2021-02-10 ENCOUNTER — Other Ambulatory Visit: Payer: Self-pay

## 2021-02-10 LAB — CBC WITH DIFFERENTIAL/PLATELET
Basophils Absolute: 0 10*3/uL (ref 0.0–0.2)
Basos: 1 %
EOS (ABSOLUTE): 0.1 10*3/uL (ref 0.0–0.4)
Eos: 1 %
Hematocrit: 44 % (ref 34.0–46.6)
Hemoglobin: 14.7 g/dL (ref 11.1–15.9)
Immature Grans (Abs): 0 10*3/uL (ref 0.0–0.1)
Immature Granulocytes: 0 %
Lymphocytes Absolute: 1.3 10*3/uL (ref 0.7–3.1)
Lymphs: 33 %
MCH: 31.1 pg (ref 26.6–33.0)
MCHC: 33.4 g/dL (ref 31.5–35.7)
MCV: 93 fL (ref 79–97)
Monocytes Absolute: 0.4 10*3/uL (ref 0.1–0.9)
Monocytes: 10 %
Neutrophils Absolute: 2.1 10*3/uL (ref 1.4–7.0)
Neutrophils: 55 %
Platelets: 263 10*3/uL (ref 150–450)
RBC: 4.72 x10E6/uL (ref 3.77–5.28)
RDW: 13.4 % (ref 11.7–15.4)
WBC: 3.9 10*3/uL (ref 3.4–10.8)

## 2021-02-10 LAB — LIPID PANEL
Chol/HDL Ratio: 3.5 ratio (ref 0.0–4.4)
Cholesterol, Total: 216 mg/dL — ABNORMAL HIGH (ref 100–199)
HDL: 61 mg/dL
LDL Chol Calc (NIH): 137 mg/dL — ABNORMAL HIGH (ref 0–99)
Triglycerides: 101 mg/dL (ref 0–149)
VLDL Cholesterol Cal: 18 mg/dL (ref 5–40)

## 2021-02-10 LAB — CMP14+EGFR
ALT: 16 [IU]/L (ref 0–32)
AST: 13 [IU]/L (ref 0–40)
Albumin/Globulin Ratio: 1.5 (ref 1.2–2.2)
Albumin: 4.5 g/dL (ref 3.8–4.9)
Alkaline Phosphatase: 135 [IU]/L — ABNORMAL HIGH (ref 44–121)
BUN/Creatinine Ratio: 15 (ref 9–23)
BUN: 11 mg/dL (ref 6–24)
Bilirubin Total: 0.3 mg/dL (ref 0.0–1.2)
CO2: 24 mmol/L (ref 20–29)
Calcium: 9.5 mg/dL (ref 8.7–10.2)
Chloride: 105 mmol/L (ref 96–106)
Creatinine, Ser: 0.73 mg/dL (ref 0.57–1.00)
Globulin, Total: 3.1 g/dL (ref 1.5–4.5)
Glucose: 97 mg/dL (ref 65–99)
Potassium: 4.6 mmol/L (ref 3.5–5.2)
Sodium: 142 mmol/L (ref 134–144)
Total Protein: 7.6 g/dL (ref 6.0–8.5)
eGFR: 100 mL/min/{1.73_m2}

## 2021-02-10 LAB — HCV AB W REFLEX TO QUANT PCR: HCV Ab: <0.1 s/co ratio (ref 0.0–0.9)

## 2021-02-10 LAB — HCV INTERPRETATION

## 2021-02-11 ENCOUNTER — Other Ambulatory Visit: Payer: Self-pay

## 2021-02-13 ENCOUNTER — Telehealth: Payer: Self-pay

## 2021-02-13 NOTE — Telephone Encounter (Signed)
-----   Message from Gildardo Pounds, NP sent at 02/10/2021  8:18 AM EDT ----- Hepatitis C is negative. CBC does not indicate any anemia or bleeding disorders. Kidney function and electrolytes are normal. Cholesterol elevated. Try to limit foods high in fat and cholesterol: fried foods, beef, processed foods, take out and unhealthy snacks such as chips, cookies, cakes

## 2021-02-13 NOTE — Telephone Encounter (Signed)
Patient name and DOB has been verified Patient was informed of lab results. Patient had no questions.  

## 2021-04-01 ENCOUNTER — Ambulatory Visit: Payer: Self-pay

## 2021-04-01 NOTE — Telephone Encounter (Signed)
Patient called in to say that [facility] and at 10.15 am  her BP was 172/113 she was there for a while and before leaving at about 11.45 Am its up to 180/22 per patient she feel fine but is still very concerned. Can be reached at Ph# [phone]   Pt. [facility] and her BP was 172/113, 180/112. Has "a very small headache." No other symptoms. Has not taken her medication today. Currently not at home. Practice currently closed for lunch. Instructed pt. To go to ED if symptoms occur. Please advise pt.   Reason for Disposition  [8] Systolic BP  >= 921 OR Diastolic >= 194 AND [1] missed most recent dose of blood pressure medication  Answer Assessment - Initial Assessment Questions 1. BLOOD PRESSURE: "What is the blood pressure?" "Did you take at least two measurements 5 minutes apart?"     172/113 , 180/122 2. ONSET: "When did you take your blood pressure?"     This morning 3. HOW: "How did you obtain the blood pressure?" (e.g., visiting nurse, automatic home BP monitor)     Nurse 4. HISTORY: "Do you have a history of high blood pressure?"     Yes 5. MEDICATIONS: "Are you taking any medications for blood pressure?" "Have you missed any doses recently?"     Has not had medication today 6. OTHER SYMPTOMS: "Do you have any symptoms?" (e.g., headache, chest pain, blurred vision, difficulty breathing, weakness)     Mild headache 7. PREGNANCY: "Is there any chance you are pregnant?" "When was your last menstrual period?"     No  Protocols used: Blood Pressure - High-A-AH

## 2021-04-02 ENCOUNTER — Other Ambulatory Visit: Payer: Self-pay

## 2021-04-02 ENCOUNTER — Telehealth: Payer: Self-pay | Admitting: Family Medicine

## 2021-04-02 ENCOUNTER — Emergency Department (HOSPITAL_COMMUNITY)
Admission: EM | Admit: 2021-04-02 | Discharge: 2021-04-03 | Disposition: A | Discharge status: Home / Self Care | Payer: Self-pay | Attending: Emergency Medicine | Admitting: Emergency Medicine

## 2021-04-02 DIAGNOSIS — I10 Essential (primary) hypertension: Secondary | ICD-10-CM | POA: Insufficient documentation

## 2021-04-02 DIAGNOSIS — J321 Chronic frontal sinusitis: Secondary | ICD-10-CM | POA: Insufficient documentation

## 2021-04-02 DIAGNOSIS — J329 Chronic sinusitis, unspecified: Secondary | ICD-10-CM

## 2021-04-02 DIAGNOSIS — R42 Dizziness and giddiness: Secondary | ICD-10-CM

## 2021-04-02 LAB — CBC WITH DIFFERENTIAL/PLATELET
Abs Immature Granulocytes: 0 10*3/uL (ref 0.00–0.07)
Basophils Absolute: 0 10*3/uL (ref 0.0–0.1)
Basophils Relative: 1 %
Eosinophils Absolute: 0.1 10*3/uL (ref 0.0–0.5)
Eosinophils Relative: 3 %
HCT: 41.3 % (ref 36.0–46.0)
Hemoglobin: 13.7 g/dL (ref 12.0–15.0)
Immature Granulocytes: 0 %
Lymphocytes Relative: 38 %
Lymphs Abs: 1.4 10*3/uL (ref 0.7–4.0)
MCH: 31.2 pg (ref 26.0–34.0)
MCHC: 33.2 g/dL (ref 30.0–36.0)
MCV: 94.1 fL (ref 80.0–100.0)
Monocytes Absolute: 0.4 10*3/uL (ref 0.1–1.0)
Monocytes Relative: 10 %
Neutro Abs: 1.8 10*3/uL (ref 1.7–7.7)
Neutrophils Relative %: 48 %
Platelets: 228 10*3/uL (ref 150–400)
RBC: 4.39 MIL/uL (ref 3.87–5.11)
RDW: 12 % (ref 11.5–15.5)
WBC: 3.8 10*3/uL — ABNORMAL LOW (ref 4.0–10.5)
nRBC: 0 % (ref 0.0–0.2)

## 2021-04-02 LAB — BASIC METABOLIC PANEL
Anion gap: 7 (ref 5–15)
BUN: 11 mg/dL (ref 6–20)
CO2: 24 mmol/L (ref 22–32)
Calcium: 9.1 mg/dL (ref 8.9–10.3)
Chloride: 105 mmol/L (ref 98–111)
Creatinine, Ser: 0.5 mg/dL (ref 0.44–1.00)
GFR, Estimated: >60 mL/min (ref >60)
Glucose, Bld: 100 mg/dL — ABNORMAL HIGH (ref 70–99)
Potassium: 3.7 mmol/L (ref 3.5–5.1)
Sodium: 136 mmol/L (ref 135–145)

## 2021-04-02 LAB — URINALYSIS, ROUTINE W REFLEX MICROSCOPIC
Bilirubin Urine: NEGATIVE
Glucose, UA: NEGATIVE mg/dL
Hgb urine dipstick: NEGATIVE
Ketones, ur: NEGATIVE mg/dL
Leukocytes,Ua: NEGATIVE
Nitrite: NEGATIVE
Protein, ur: NEGATIVE mg/dL
Specific Gravity, Urine: 1.014 (ref 1.005–1.030)
pH: 6 (ref 5.0–8.0)

## 2021-04-02 NOTE — Telephone Encounter (Signed)
Patient called back and I scheduled an appt. With Geryl Rankins for 04/03/21 at 10:50

## 2021-04-02 NOTE — ED Provider Notes (Signed)
Emergency Medicine Provider Triage Evaluation Note  Tina Moss , a 50 y.o. female  was evaluated in triage.  Pt complains of hypertension.  Has been off her blood pressure medicine for a while because she does not like how it makes her feel.  Was seen in her primary care doctor office yesterday and they were concerned about the level of hypertension.  Wanted her to go to the ED then, patient has been feeling weak and dizzy.  Denies any chest pain or shortness of breath.  She started taking her blood pressure medicine yesterday and took it today.  She has not having any headaches..  Review of Systems  Positive: High blood pressure, weakness, dizziness Negative: Chest pain, headache  Physical Exam  BP (!) 195/111 (BP Location: Right Arm)   Pulse 80   Temp 97.8 F (36.6 C) (Oral)   Resp 18   Ht 5\' 7"  (1.702 m)   Wt 90.3 kg   SpO2 98%   BMI 31.17 kg/m  Gen:   Awake, no distress   Resp:  Normal effort  MSK:   Moves extremities without difficulty  Other:    Medical Decision Making  Medically screening exam initiated at 8:28 PM.  Appropriate orders placed.  Tina Moss was informed that the remainder of the evaluation will be completed by another provider, this initial triage assessment does not replace that evaluation, and the importance of remaining in the ED until their evaluation is complete.  Patient significantly hypertensive despite taking her medicine.  We will check for signs of hypertensive emergency   Sherrill Raring, PA-C 04/02/21 2029    Lorelle Gibbs, DO 04/02/21 2356

## 2021-04-02 NOTE — ED Triage Notes (Signed)
Pt reports high blood pressure for the past 2 days. Pt called her doctor yesterday and they advised her to come to the hospital but went home and took her blood pressure meds. Pt has a history of stroke.

## 2021-04-02 NOTE — Telephone Encounter (Signed)
Left message on voicemail to return call.   May put in 10:50 slot with Raul Del if she calls back and appt still available.

## 2021-04-03 ENCOUNTER — Other Ambulatory Visit: Payer: Self-pay

## 2021-04-03 ENCOUNTER — Emergency Department (HOSPITAL_COMMUNITY): Payer: Self-pay

## 2021-04-03 ENCOUNTER — Encounter: Payer: Self-pay | Admitting: Nurse Practitioner

## 2021-04-03 ENCOUNTER — Ambulatory Visit: Payer: Self-pay | Attending: Nurse Practitioner | Admitting: Nurse Practitioner

## 2021-04-03 VITALS — BP 163/105 | HR 88 | Ht 67.0 in | Wt 199.4 lb

## 2021-04-03 DIAGNOSIS — I10 Essential (primary) hypertension: Secondary | ICD-10-CM

## 2021-04-03 DIAGNOSIS — Z1231 Encounter for screening mammogram for malignant neoplasm of breast: Secondary | ICD-10-CM

## 2021-04-03 DIAGNOSIS — B353 Tinea pedis: Secondary | ICD-10-CM

## 2021-04-03 DIAGNOSIS — E559 Vitamin D deficiency, unspecified: Secondary | ICD-10-CM

## 2021-04-03 DIAGNOSIS — K219 Gastro-esophageal reflux disease without esophagitis: Secondary | ICD-10-CM

## 2021-04-03 MED ORDER — KETOCONAZOLE 2 % EX CREA
TOPICAL_CREAM | Freq: Every day | CUTANEOUS | 1 refills | Status: AC
  Filled 2021-04-03: qty 30, 14d supply, fill #0

## 2021-04-03 MED ORDER — FAMOTIDINE 20 MG PO TABS
40.0000 mg | ORAL_TABLET | Freq: Every day | ORAL | 0 refills | Status: DC
  Filled 2021-04-03: qty 60, 30d supply, fill #0

## 2021-04-03 MED ORDER — LOSARTAN POTASSIUM 25 MG PO TABS
25.0000 mg | ORAL_TABLET | Freq: Every day | ORAL | 0 refills | Status: DC
Start: 2021-04-03 — End: 2021-06-03
  Filled 2021-04-03: qty 30, 30d supply, fill #0

## 2021-04-03 MED ORDER — AMOXICILLIN 500 MG PO CAPS
500.0000 mg | ORAL_CAPSULE | Freq: Three times a day (TID) | ORAL | 0 refills | Status: DC
  Filled 2021-04-03: qty 21, 7d supply, fill #0

## 2021-04-03 MED ORDER — METOPROLOL TARTRATE 50 MG PO TABS
50.0000 mg | ORAL_TABLET | Freq: Two times a day (BID) | ORAL | 3 refills | Status: DC
  Filled 2021-04-03: qty 60, 30d supply, fill #0

## 2021-04-03 MED ORDER — LORAZEPAM 1 MG PO TABS
1.0000 mg | ORAL_TABLET | Freq: Once | ORAL | Status: AC
  Administered 2021-04-03: 1 mg via ORAL
  Filled 2021-04-03: qty 1

## 2021-04-03 MED ORDER — VITAMIN D (ERGOCALCIFEROL) 1.25 MG (50000 UNIT) PO CAPS
ORAL_CAPSULE | ORAL | 2 refills | Status: DC
  Filled 2021-04-03: qty 4, 28d supply, fill #0

## 2021-04-03 NOTE — Telephone Encounter (Signed)
Patient has an appointment today

## 2021-04-03 NOTE — ED Provider Notes (Signed)
[facility] EMERGENCY DEPARTMENT Provider Note   CSN: [id number] Arrival date & time: 04/02/21  1950     History Chief Complaint  Patient presents with   Hypertension    Tina Moss is a 51 y.o. female.  The history is provided by the patient.  Hypertension This is a chronic problem. The problem occurs constantly. The problem has been gradually worsening. Pertinent negatives include no chest pain and no abdominal pain. Nothing aggravates the symptoms. Nothing relieves the symptoms.  Patient with history of hypertension, stroke presents with dizziness and high blood pressure.  Patient reports she is on blood pressure medications but does not always take it due to side effects.  Over the past day she has noted increasing blood pressure and has  felt dizzy and her balance was off.  Family member who was at the bedside reports patient's gait has been distorted as if she might fall.  She is also had some mild slurred speech that is improved. No syncope or seizures.  No new pain complaints. 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 at term   TEE WITHOUT CARDIOVERSION N/A 04/06/2018   Procedure: TRANSESOPHAGEAL ECHOCARDIOGRAM (TEE) BUBBLE STUDY;  Surgeon: Lelon Perla, MD;  [facility] ENDOSCOPY;  Service: Cardiovascular;  Laterality: N/A;   TUBAL LIGATION     WISDOM TOOTH EXTRACTION       OB History     Gravida  3   Para  2   Term  2   Preterm      AB  1   Living  2      SAB  1   IAB      Ectopic      Multiple      Live Births  2        Obstetric Comments  Term c/s x 2.          Family History  Problem Relation Age of Onset   Hypertension Mother    Hypertension Sister    Arthritis Sister        knees   Hypertension Brother    Deafness Brother    Speech disorder Brother        mute   Mental illness Brother        "he can just fly off"   Mental illness Daughter        ? bipolar   Scoliosis  Son     Social History   Tobacco Use   Smoking status: Some Days    Types: Cigars   Smokeless tobacco: Never   Tobacco comments:    "I think I just keep so much on my mind."  Vaping Use   Vaping Use: Never used  Substance Use Topics   Alcohol use: Yes    Alcohol/week: 0.0 - 3.0 standard drinks    Comment: social   Drug use: Not Currently    Types: Marijuana    Comment: occasionally    Home Medications Prior to Admission medications   Medication Sig Start Date End Date Taking? Authorizing Provider  amoxicillin (AMOXIL) 500 MG capsule Take 1 capsule (500 mg total) by mouth 3 (three) times daily. 04/03/21  Yes Ripley Fraise, MD  atorvastatin (LIPITOR) 40 MG tablet Take 1 tablet (40 mg total) by mouth daily. at 6pm. NEEDS PASS 02/09/21 05/10/21  Gildardo Pounds, NP  clopidogrel (PLAVIX) 75 MG tablet Take 1 tablet (75 mg total) by mouth daily 02/09/21 05/10/21  Gildardo Pounds, NP  DULoxetine (CYMBALTA) 60 MG capsule Take 1 capsule (60 mg total) by mouth daily. 02/09/21  03/11/21  Gildardo Pounds, NP  ferrous sulfate 325 (65 FE) MG tablet TAKE 1 TABLET (325 MG TOTAL) BY MOUTH 2 (TWO) TIMES DAILY WITH A MEAL. 02/09/21 05/10/21  Gildardo Pounds, NP  hydrOXYzine (ATARAX/VISTARIL) 10 MG tablet Take 1 tablet (10 mg total) by mouth 3 (three) times daily as needed. 02/09/21   Gildardo Pounds, NP  ketoconazole (NIZORAL) 2 % cream APPLY 1 APPLICATION TOPICALLY DAILY. 07/15/20 07/15/21  Mayers, Cari S, PA-C  medroxyPROGESTERone (PROVERA) 10 MG tablet TAKE 1 TABLET (10 MG TOTAL) BY MOUTH 2 (TWO) TIMES DAILY. 07/15/20 07/15/21  Mayers, Cari S, PA-C  meloxicam (MOBIC) 15 MG tablet Take 1 tablet (15 mg total) by mouth daily. 10/24/19   Charlott Rakes, MD  metoprolol tartrate (LOPRESSOR) 50 MG tablet Take 1 tablet (50 mg total) by mouth 2 (two) times daily. 02/09/21 03/11/21  Gildardo Pounds, NP  Vitamin D, Ergocalciferol, (DRISDOL) 1.25 MG (50000 UNIT) CAPS capsule TAKE 1 CAPSULE (50,000 UNITS TOTAL) BY MOUTH EVERY 7 (SEVEN) DAYS. 07/17/20 07/17/21  Mayers, Loraine Grip, PA-C    Allergies    Patient has no known allergies.  Review of Systems   Review of Systems  Constitutional:  Negative for fever.  Eyes:  Negative for visual disturbance.  Cardiovascular:  Negative for chest pain.  Gastrointestinal:  Negative for abdominal pain.  Neurological:  Positive for speech difficulty.  All other systems reviewed and are negative.  Physical Exam Updated Vital Signs BP (!) 154/108   Pulse 96   Temp 98.7 F (37.1 C) (Oral)   Resp 18   Ht 1.702 m (5\' 7" )   Wt 90.3 kg   SpO2 99%   BMI 31.17 kg/m   Physical Exam CONSTITUTIONAL: Well developed/well nourished HEAD: Normocephalic/atraumatic EYES: EOMI/PERRL, no nystagmus, no ptosis ENMT: Mucous membranes moist NECK: supple no meningeal signs, no bruits CV: S1/S2 noted, no murmurs/rubs/gallops noted LUNGS: Lungs are clear to auscultation bilaterally, no apparent distress ABDOMEN: soft, nontender, no rebound or guarding GU:no cva  tenderness NEURO:Awake/alert, face symmetric, no arm or leg drift is noted Speech is slow but no dysarthria Equal 5/5 strength with shoulder abduction, elbow flex/extension Patient with 4 out of 5 strength right hip flexion, no other weakness noted in lower extremities flex/extension, foot dorsi/plantar flexion Cranial nerves 3/4/5/6/12/20/08/11/12 tested and intact Mild ataxia  noted with leaning to the right No past pointing Sensation to light touch intact in all extremities EXTREMITIES: pulses normal, full ROM SKIN: warm, color normal PSYCH: no abnormalities of mood noted   ED Results / Procedures / Treatments   Labs (all labs ordered are listed, but only abnormal results are displayed) Labs Reviewed  BASIC METABOLIC PANEL - Abnormal; Notable for the following components:      Result Value   Glucose, Bld 100 (*)    All other components within normal limits  CBC WITH DIFFERENTIAL/PLATELET - Abnormal; Notable for the following components:   WBC 3.8 (*)    All other components within normal limits  URINALYSIS, ROUTINE W REFLEX MICROSCOPIC    EKG EKG Interpretation  Date/Time:  Thursday April 02 2021 20:42:44 EDT Ventricular Rate:  79 PR Interval:  128 QRS Duration: 84 QT Interval:  400 QTC Calculation: 458 R Axis:   52 Text Interpretation: Normal sinus rhythm Right atrial enlargement Left ventricular hypertrophy with repolarization abnormality ( Sokolow-Lyon ) Abnormal ECG Confirmed by Ripley Fraise [phone]) on 04/03/2021 12:51:20 AM  Radiology MR BRAIN WO CONTRAST  Result Date: 04/03/2021 CLINICAL DATA:  50 year old female with dizziness.  Hypertension. EXAM: MRI HEAD WITHOUT CONTRAST TECHNIQUE: Multiplanar, multiecho pulse sequences of the brain and surrounding structures were obtained without intravenous contrast. COMPARISON:  Brain MRI 04/04/2018 and earlier. FINDINGS: Brain: No restricted diffusion to suggest acute infarction. No midline shift, mass effect, evidence  of mass lesion, ventriculomegaly, extra-axial collection or acute intracranial hemorrhage. Cervicomedullary junction and pituitary are within normal limits. Cerebral volume remains normal. Nodular chronic cerebral white matter disease, with small areas of cystic encephalomalacia most pronounced in the bilateral corona radiata. But in 2019 there was a superimposed left MCA cortical infarct, now with subtle associated cortical encephalomalacia on series 11, image 21. Mild associated hemosiderin there. No other chronic cerebral blood products or cortical encephalomalacia identified. Chronic encephalomalacia in the posterior right corona radiata, but the deep gray matter nuclei, brainstem and cerebellum remain within normal limits. Vascular: Major intracranial vascular flow voids are stable since 2019. Skull and upper cervical spine: Negative for age visible cervical spine. Normal bone marrow signal. Sinuses/Orbits: Disconjugate gaze but otherwise negative orbits. New right frontal sinus mucosal thickening and opacification but other Visualized paranasal sinuses and mastoids are stable and well aerated. Other: Stable and grossly normal visible internal auditory structures. Normal stylomastoid foramina. Negative visible scalp and face. IMPRESSION: 1. No acute intracranial abnormality. 2. 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 3. Mild right frontal paranasal sinus inflammation. Electronically Signed   By: Genevie Ann M.D.   On: 04/03/2021 06:36    Procedures Procedures   Medications Ordered in ED Medications  LORazepam (ATIVAN) tablet 1 mg (1 mg Oral Given 04/03/21 0453)    ED Course  I have reviewed the triage vital signs and the nursing notes.  Pertinent labs & imaging results that were available during my care of the patient were reviewed by me and considered in my medical decision making  (see chart for details).    MDM Rules/Calculators/A&P               NIH Stroke Scale: 0           PT w/History of hypertension and previous stroke with right-sided deficits presents with dizziness and slurred speech and elevated blood pressure. On my exam patient has some weakness  in the right leg that she reports is chronic.  She appeared mildly ataxic.  It is unclear when the symptoms began.  Patient is a poor historian and has questionable compliance with her medications. Patient is high risk for recurrent stroke. Plan to obtain MRI brain while in the Emergency Department.  Apparently patient does have follow-up with PCP later in the day Patient and family member agreeable with plan 7:15 AM MRI negative for stroke Patient has been resting comfortably. Did have evidence of sinus disease, will start antibiotics.  She has PCP follow-up today and will follow-up at that time.  She will need further management of her blood pressure medications.  Discussed this with her daughter via phone who will pick her up Final Clinical Impression(s) / ED Diagnoses Final diagnoses:  Primary hypertension  Dizziness  Sinusitis, unspecified chronicity, unspecified location    Rx / DC Orders ED Discharge Orders          Ordered    amoxicillin (AMOXIL) 500 MG capsule  3 times daily        04/03/21 4008             Ripley Fraise, MD 04/03/21 [id number]

## 2021-04-03 NOTE — Progress Notes (Signed)
Assessment & Plan:  Tina Moss was seen today for hypertension.  Diagnoses and all orders for this visit:  Primary hypertension -     losartan (COZAAR) 25 MG tablet; Take 1 tablet (25 mg total) by mouth daily. NEEDS PASS BP check in 4 weeks with PCP and BMP Continue all antihypertensives as prescribed.  Remember to bring in your blood pressure log with you for your follow up appointment.  DASH/Mediterranean Diets are healthier choices for HTN.    Vitamin D deficiency -     Vitamin D, Ergocalciferol, (DRISDOL) 1.25 MG (50000 UNIT) CAPS capsule; TAKE 1 CAPSULE (50,000 UNITS TOTAL) BY MOUTH EVERY 7 (SEVEN) DAYS.  GERD without esophagitis -     famotidine (PEPCID) 20 MG tablet; Take 2 tablets (40 mg total) by mouth daily. INSTRUCTIONS: Avoid GERD Triggers: acidic, spicy or fried foods, caffeine, coffee, sodas,  alcohol and chocolate.    Tinea pedis of right foot -     ketoconazole (NIZORAL) 2 % cream; APPLY 1 APPLICATION TOPICALLY DAILY.   Patient has been counseled on age-appropriate routine health concerns for screening and prevention. These are reviewed and up-to-date. Referrals have been placed accordingly. Immunizations are up-to-date or declined.    Subjective:   Chief Complaint  Patient presents with   Hypertension  She has a past medical history of Anemia, Epilepsy ( no seizures in 7 years and does not take anti seizure medication), Fibroids, Hyperlipidemia, Hypertension, Stroke, Anxiety and Depression, and Seizures ([place]).     Hypertension Pertinent negatives include no blurred vision, chest pain, headaches, malaise/fatigue, palpitations or shortness of breath.  Tina Moss 50 y.o. female presents to office today for BP check. She was seen in the ED in the past 24 hours and noted for HTN however no changes were made with her blood pressure medication.  She endorses medication adherence today however she does have a history of medication noncompliance and her current  prescription for metoprolol has expired at the pharmacy.  Currently prescribed metoprolol 50 mg twice daily.  We will add losartan 25 mg daily today. BP Readings from Last 3 Encounters:  04/03/21 (!) 163/105  04/03/21 (!) 154/108  02/09/21 (!) 138/91     Requesting refill of ketoconazole cream due to tinea pedis of the right foot.  She also has onychomycosis and is requesting medication for that however I will defer that to her PCP for next office visit as I do not prescribe p.o. Lamisil.  GERD She has been taking her sisters acid reducing medication and reports relief of epigastric pain with taking this however she cannot recall the name of the medication Paitent complains of heartburn. This has been associated with chest pain, deep pressure at base of neck, and midespigastric pain.  She denies choking on food, hematemesis, melena, nausea, regurgitation of undigested food, and shortness of breath. Symptoms have been present for several weeks. She denies dysphagia.  She has not lost weight. She denies melena, hematochezia, hematemesis, and coffee ground emesis. Medical therapy in the past has included OTC medication.   Review of Systems  Constitutional:  Negative for fever, malaise/fatigue and weight loss.  HENT: Negative.  Negative for nosebleeds.   Eyes: Negative.  Negative for blurred vision, double vision and photophobia.  Respiratory: Negative.  Negative for cough and shortness of breath.   Cardiovascular: Negative.  Negative for chest pain, palpitations and leg swelling.  Gastrointestinal:  Positive for heartburn. Negative for abdominal pain, blood in stool, constipation, diarrhea, melena, nausea and vomiting.  Musculoskeletal: Negative.  Negative for myalgias.  Neurological: Negative.  Negative for dizziness, focal weakness, seizures and headaches.  Psychiatric/Behavioral: Negative.  Negative for suicidal ideas.    Past Medical History:  Diagnosis Date   Anemia    Epilepsy ([place])     Fibroids    H/O bacterial infection    H/O mumps    Hyperlipidemia    Hypertension    Seizures ([place])     Past Surgical History:  Procedure Laterality Date   CESAREAN SECTION     x2. at term   TEE WITHOUT CARDIOVERSION N/A 04/06/2018   Procedure: TRANSESOPHAGEAL ECHOCARDIOGRAM (TEE) BUBBLE STUDY;  Surgeon: Lelon Perla, MD;  Location: [facility] ENDOSCOPY;  Service: Cardiovascular;  Laterality: N/A;   TUBAL LIGATION     WISDOM TOOTH EXTRACTION      Family History  Problem Relation Age of Onset   Hypertension Mother    Hypertension Sister    Arthritis Sister        knees   Hypertension Brother    Deafness Brother    Speech disorder Brother        mute   Mental illness Brother        "he can just fly off"   Mental illness Daughter        ? bipolar   Scoliosis Son     Social History Reviewed with no changes to be made today.   Outpatient Medications Prior to Visit  Medication Sig Dispense Refill   amoxicillin (AMOXIL) 500 MG capsule Take 1 capsule (500 mg total) by mouth 3 (three) times daily. 21 capsule 0   atorvastatin (LIPITOR) 40 MG tablet Take 1 tablet (40 mg total) by mouth daily. at 6pm. NEEDS PASS 90 tablet 1   clopidogrel (PLAVIX) 75 MG tablet Take 1 tablet (75 mg total) by mouth daily 90 tablet 0   ferrous sulfate 325 (65 FE) MG tablet TAKE 1 TABLET (325 MG TOTAL) BY MOUTH 2 (TWO) TIMES DAILY WITH A MEAL. 180 tablet 3   hydrOXYzine (ATARAX/VISTARIL) 10 MG tablet Take 1 tablet (10 mg total) by mouth 3 (three) times daily as needed. 60 tablet 1   medroxyPROGESTERone (PROVERA) 10 MG tablet TAKE 1 TABLET (10 MG TOTAL) BY MOUTH 2 (TWO) TIMES DAILY. 60 tablet 6   meloxicam (MOBIC) 15 MG tablet Take 1 tablet (15 mg total) by mouth daily. 30 tablet 0   ketoconazole (NIZORAL) 2 % cream APPLY 1 APPLICATION TOPICALLY DAILY. 15 g 0   Vitamin D, Ergocalciferol, (DRISDOL) 1.25 MG (50000 UNIT) CAPS capsule TAKE 1 CAPSULE (50,000 UNITS TOTAL) BY MOUTH EVERY 7 (SEVEN) DAYS. 4 capsule  2   DULoxetine (CYMBALTA) 60 MG capsule Take 1 capsule (60 mg total) by mouth daily. 30 capsule 1   metoprolol tartrate (LOPRESSOR) 50 MG tablet Take 1 tablet (50 mg total) by mouth 2 (two) times daily. 60 tablet 3   No facility-administered medications prior to visit.    No Known Allergies     Objective:    BP (!) 163/105   Pulse 88   Ht 5\' 7"  (1.702 m)   Wt 199 lb 6 oz (90.4 kg)   SpO2 97%   BMI 31.23 kg/m  Wt Readings from Last 3 Encounters:  04/03/21 199 lb 6 oz (90.4 kg)  04/02/21 199 lb (90.3 kg)  02/09/21 191 lb 6 oz (86.8 kg)    Physical Exam Vitals and nursing note reviewed.  Constitutional:      Appearance: She is well-developed.  HENT:     Head: Normocephalic and atraumatic.  Cardiovascular:     Rate and Rhythm: Normal rate and regular rhythm.     Heart sounds: Normal heart sounds. No murmur heard.   No friction rub. No gallop.  Pulmonary:     Effort: Pulmonary effort is normal. No tachypnea or respiratory distress.     Breath sounds: Normal breath sounds. No decreased breath sounds, wheezing, rhonchi or rales.  Chest:     Chest wall: No tenderness.  Abdominal:     General: Bowel sounds are normal.     Palpations: Abdomen is soft.  Musculoskeletal:        General: Normal range of motion.     Cervical back: Normal range of motion.  Skin:    General: Skin is warm and dry.  Neurological:     Mental Status: She is alert and oriented to person, place, and time.     Coordination: Coordination normal.  Psychiatric:        Behavior: Behavior normal. Behavior is cooperative.        Thought Content: Thought content normal.        Judgment: Judgment normal.         Patient has been counseled extensively about nutrition and exercise as well as the importance of adherence with medications and regular follow-up. The patient was given clear instructions to go to ER or return to medical center if symptoms don't improve, worsen or new problems develop. The  patient verbalized understanding.   Follow-up: No follow-ups on file.   Gildardo Pounds, FNP-BC [facility] [place], [place]   04/03/2021, 11:52 AM

## 2021-04-03 NOTE — ED Notes (Signed)
Pt provided with ginger ale and crackers. Pt passed swallow screen.

## 2021-04-06 ENCOUNTER — Other Ambulatory Visit: Payer: Self-pay

## 2021-04-07 ENCOUNTER — Other Ambulatory Visit: Payer: Self-pay

## 2021-05-01 ENCOUNTER — Telehealth: Payer: Self-pay | Admitting: Family Medicine

## 2021-05-01 NOTE — Telephone Encounter (Signed)
Can you please reach out to her and refer? Thank you.

## 2021-05-01 NOTE — Telephone Encounter (Signed)
Copied from [place] [id number]. Topic: Referral - Request for Referral >> Apr 29, 2021 11:30 AM Tessa Lerner A wrote: Has patient seen PCP for this complaint? Yes.   *If NO, is insurance requiring patient see PCP for this issue before PCP can refer them? Referral for which specialty: Behavioral Health  Preferred provider/office: [facility]  Reason for referral: Patient would like counseling

## 2021-05-01 NOTE — Telephone Encounter (Signed)
Routing to PCP for review.

## 2021-05-12 ENCOUNTER — Ambulatory Visit: Payer: Self-pay | Admitting: Family Medicine

## 2021-05-13 NOTE — Telephone Encounter (Signed)
Pt called to see if referral was placed / please advised

## 2021-05-18 NOTE — Telephone Encounter (Signed)
Please follow up with patient.

## 2021-06-03 ENCOUNTER — Ambulatory Visit: Payer: Self-pay | Attending: Family Medicine | Admitting: Physician Assistant

## 2021-06-03 ENCOUNTER — Other Ambulatory Visit: Payer: Self-pay

## 2021-06-03 VITALS — BP 143/86 | HR 100 | Ht 66.5 in | Wt 197.4 lb

## 2021-06-03 DIAGNOSIS — R252 Cramp and spasm: Secondary | ICD-10-CM

## 2021-06-03 DIAGNOSIS — M2550 Pain in unspecified joint: Secondary | ICD-10-CM

## 2021-06-03 DIAGNOSIS — M25561 Pain in right knee: Secondary | ICD-10-CM

## 2021-06-03 DIAGNOSIS — I10 Essential (primary) hypertension: Secondary | ICD-10-CM

## 2021-06-03 MED ORDER — MELOXICAM 15 MG PO TABS
15.0000 mg | ORAL_TABLET | Freq: Every day | ORAL | 2 refills | Status: DC
Start: 2021-06-03 — End: 2021-07-01
  Filled 2021-06-03 – 2021-06-20 (×2): qty 30, 30d supply, fill #0

## 2021-06-03 MED ORDER — METHOCARBAMOL 500 MG PO TABS
500.0000 mg | ORAL_TABLET | Freq: Four times a day (QID) | ORAL | 0 refills | Status: DC | PRN
  Filled 2021-06-03 – 2021-06-20 (×2): qty 90, 23d supply, fill #0

## 2021-06-03 MED ORDER — LOSARTAN POTASSIUM 25 MG PO TABS
25.0000 mg | ORAL_TABLET | Freq: Every day | ORAL | 2 refills | Status: DC
Start: 2021-06-03 — End: 2021-07-01
  Filled 2021-06-03 – 2021-06-20 (×2): qty 30, 30d supply, fill #0

## 2021-06-03 MED ORDER — HYDROCHLOROTHIAZIDE 12.5 MG PO CAPS
12.5000 mg | ORAL_CAPSULE | Freq: Every day | ORAL | 3 refills | Status: DC
  Filled 2021-06-03 – 2021-06-20 (×2): qty 30, 30d supply, fill #0

## 2021-06-03 NOTE — Progress Notes (Signed)
Pus bumps coming on left ankle right leg in pain. Pt thinks it's fluid.

## 2021-06-03 NOTE — Progress Notes (Signed)
Patient ID: Tina Moss, female   DOB: 1970/06/16, 50 y.o.   MRN: [id number]   Tina Moss, is a 50 y.o. female  [id number]  [id number]  DOB - March 12, 1971  Chief Complaint  Patient presents with   Hypertension       Subjective:   Tina Moss is a 50 y.o. female here today for R leg and knee swelling that occurs intermittently post stroke.  It has been worse over the last few weeks bc she got a part-time job and has to stand more.  C/o muscle spasms in her R leg and sometimes in her lower back.    She also c/o pus filled sores on her feet, but she does not have any active ones right now.  She says she gets them, pops them, and the skin turns darker than before.  This occurs sporadically.    No SOB/CP/HA/dizziness  No problems updated.  ALLERGIES: No Known Allergies  PAST MEDICAL HISTORY: Past Medical History:  Diagnosis Date   Anemia    Epilepsy ([place])    Fibroids    H/O bacterial infection    H/O mumps    Hyperlipidemia    Hypertension    Seizures ([place])     MEDICATIONS AT HOME: Prior to Admission medications   Medication Sig Start Date End Date Taking? Authorizing Provider  amoxicillin (AMOXIL) 500 MG capsule Take 1 capsule (500 mg total) by mouth 3 (three) times daily. 04/03/21  Yes Ripley Fraise, MD  famotidine (PEPCID) 20 MG tablet Take 2 tablets (40 mg total) by mouth daily. 04/03/21 07/02/21 Yes Gildardo Pounds, NP  hydrochlorothiazide (MICROZIDE) 12.5 MG capsule Take 1 capsule (12.5 mg total) by mouth daily. 06/03/21  Yes Argentina Donovan, PA-C  hydrOXYzine (ATARAX/VISTARIL) 10 MG tablet Take 1 tablet (10 mg total) by mouth 3 (three) times daily as needed. 02/09/21  Yes Gildardo Pounds, NP  medroxyPROGESTERone (PROVERA) 10 MG tablet TAKE 1 TABLET (10 MG TOTAL) BY MOUTH 2 (TWO) TIMES DAILY. 07/15/20 07/15/21 Yes Mayers, Cari S, PA-C  methocarbamol (ROBAXIN) 500 MG tablet Take 1 tablet (500 mg total) by mouth every 6 (six) hours as needed for muscle  spasms. 06/03/21  Yes Deaundra Kutzer M, PA-C  Vitamin D, Ergocalciferol, (DRISDOL) 1.25 MG (50000 UNIT) CAPS capsule TAKE 1 CAPSULE (50,000 UNITS TOTAL) BY MOUTH EVERY 7 (SEVEN) DAYS. 04/03/21 04/03/22 Yes Gildardo Pounds, NP  atorvastatin (LIPITOR) 40 MG tablet Take 1 tablet (40 mg total) by mouth daily. at 6pm. NEEDS PASS 02/09/21 05/10/21  Gildardo Pounds, NP  DULoxetine (CYMBALTA) 60 MG capsule Take 1 capsule (60 mg total) by mouth daily. 02/09/21 03/11/21  Gildardo Pounds, NP  ferrous sulfate 325 (65 FE) MG tablet TAKE 1 TABLET (325 MG TOTAL) BY MOUTH 2 (TWO) TIMES DAILY WITH A MEAL. 02/09/21 05/10/21  Gildardo Pounds, NP  losartan (COZAAR) 25 MG tablet Take 1 tablet (25 mg total) by mouth daily. NEEDS PASS 06/03/21   Argentina Donovan, PA-C  meloxicam (MOBIC) 15 MG tablet Take 1 tablet (15 mg total) by mouth daily. Prn pain 06/03/21   Argentina Donovan, PA-C  metoprolol tartrate (LOPRESSOR) 50 MG tablet Take 1 tablet (50 mg total) by mouth 2 (two) times daily. 04/03/21 05/06/21  Gildardo Pounds, NP    ROS: Neg HEENT Neg resp Neg cardiac Neg GI Neg GU Neg psych Neg neuro  Objective:   Vitals:   06/03/21 1109  BP: (!) 143/86  Pulse: 100  SpO2: 98%  Weight: 197 lb 6.4 oz (89.5 kg)  Height: 5' 6.5" (1.689 m)   Exam General appearance : Awake, alert, not in any distress. Speech Clear. Not toxic looking HEENT: Atraumatic and Normocephalic Neck: Supple, no JVD. No cervical lymphadenopathy.  Chest: Good air entry bilaterally, CTAB.  No rales/rhonchi/wheezing CVS: S1 S2 regular, no murmurs.  R leg has minimal edema.  No erythema of calf and neg Homan's sign.  R knee slightly swollen without ballotment.  No erythema of joint B feet examined and only a few small typical pressure point callouses are.  The areas where she had the blisters have resolved and there are small, hyperpigmented skin is there where the areas have healed.  No active lesions Extremities: both legs are warm to  touch, skin is warm, dry,normal. Neurology: Awake alert, and oriented X 3, CN II-XII intact, Non focal Skin: No Rash  Data Review Lab Results  Component Value Date   HGBA1C 5.9 (H) 04/05/2018    Assessment & Plan   1. Primary hypertension Add HCTZ and this should help with swelling and BP.  Check BP OOO and record and bring to visit with Tina Moss in 3 weeks and check BMP at that time - hydrochlorothiazide (MICROZIDE) 12.5 MG capsule; Take 1 capsule (12.5 mg total) by mouth daily.  Dispense: 30 capsule; Refill: 3 - losartan (COZAAR) 25 MG tablet; Take 1 tablet (25 mg total) by mouth daily. NEEDS PASS  Dispense: 90 tablet; Refill: 2 - Basic metabolic panel; Future  2. Acute pain of right knee Patient does not want ortho referral at this time.  If the knee becomes more swollen, painful, erythematous UC or ED, patient verbalizes understanding - meloxicam (MOBIC) 15 MG tablet; Take 1 tablet (15 mg total) by mouth daily. Prn pain  Dispense: 30 tablet; Refill: 2  3. Arthralgia of multiple joints - meloxicam (MOBIC) 15 MG tablet; Take 1 tablet (15 mg total) by mouth daily. Prn pain  Dispense: 30 tablet; Refill: 2  4. Muscle cramps - methocarbamol (ROBAXIN) 500 MG tablet; Take 1 tablet (500 mg total) by mouth every 6 (six) hours as needed for muscle spasms.  Dispense: 90 tablet; Refill: 0 - meloxicam (MOBIC) 15 MG tablet; Take 1 tablet (15 mg total) by mouth daily. Prn pain  Dispense: 30 tablet; Refill: 2  5.  No active issues, a few small callouses, no ulcerations or blisters-she does not want podiatry referral    Patient have been counseled extensively about nutrition and exercise. Other issues discussed during this visit include: low cholesterol diet, weight control and daily exercise, foot care, annual eye examinations at Ophthalmology, importance of adherence with medications and regular follow-up. We also discussed long term complications of uncontrolled diabetes and hypertension.    Return in about 3 weeks (around 06/24/2021) for BP check with Tina Moss and BMP.  The patient was given clear instructions to go to ER or return to medical center if symptoms don't improve, worsen or new problems develop. The patient verbalized understanding. The patient was told to call to get lab results if they haven't heard anything in the next week.      Freeman Caldron, PA-C [facility] [place], [place]   06/03/2021, 11:38 AM

## 2021-06-03 NOTE — Patient Instructions (Signed)
Edema ?Edema is when you have too much fluid in your body or under your skin. Edema may make your legs, feet, and ankles swell. Swelling often happens in looser tissues, such as around your eyes. This is a common condition. It gets more common as you get older. ?There are many possible causes of edema. These include: ?Eating too much salt (sodium). ?Being on your feet or sitting for a long time. ?Certain medical conditions, such as: ?Pregnancy. ?Heart failure. ?Liver disease. ?Kidney disease. ?Cancer. ?Hot weather may make edema worse. Edema is usually painless. Your skin may look swollen or shiny. ?Follow these instructions at home: ?Medicines ?Take over-the-counter and prescription medicines only as told by your doctor. ?Your doctor may prescribe a medicine to help your body get rid of extra water (diuretic). Take this medicine if you are told to take it. ?Eating and drinking ?Eat a low-salt (low-sodium) diet as told by your doctor. Sometimes, eating less salt may reduce swelling. ?Depending on the cause of your swelling, you may need to limit how much fluid you drink (fluid restriction). ?General instructions ?Raise the injured area above the level of your heart while you are sitting or lying down. ?Do not sit still or stand for a long time. ?Do not wear tight clothes. Do not wear garters on your upper legs. ?Exercise your legs. This can help the swelling go down. ?Wear compression stockings as told by your doctor. It is important that these are the right size. These should be prescribed by your doctor to prevent possible injuries. ?If elastic bandages or wraps are recommended, use them as told by your doctor. ?Contact a doctor if: ?Treatment is not working. ?You have heart, liver, or kidney disease and have symptoms of edema. ?You have sudden and unexplained weight gain. ?Get help right away if: ?You have shortness of breath or chest pain. ?You cannot breathe when you lie down. ?You have pain, redness, or warmth  in the swollen areas. ?You have heart, liver, or kidney disease and get edema all of a sudden. ?You have a fever and your symptoms get worse all of a sudden. ?These symptoms may be an emergency. Get help right away. Call 911. ?Do not wait to see if the symptoms will go away. ?Do not drive yourself to the hospital. ?Summary ?Edema is when you have too much fluid in your body or under your skin. ?Edema may make your legs, feet, and ankles swell. Swelling often happens in looser tissues, such as around your eyes. ?Raise the injured area above the level of your heart while you are sitting or lying down. ?Follow your doctor's instructions about diet and how much fluid you can drink. ?This information is not intended to replace advice given to you by your health care provider. Make sure you discuss any questions you have with your health care provider. ?Document Revised: 02/02/2021 Document Reviewed: 02/02/2021 ?Elsevier Patient Education ? 2022 Elsevier Inc. ? ?

## 2021-06-12 ENCOUNTER — Other Ambulatory Visit: Payer: Self-pay

## 2021-06-17 ENCOUNTER — Other Ambulatory Visit: Payer: Self-pay

## 2021-06-20 ENCOUNTER — Other Ambulatory Visit: Payer: Self-pay

## 2021-06-26 ENCOUNTER — Other Ambulatory Visit: Payer: Self-pay

## 2021-06-29 ENCOUNTER — Other Ambulatory Visit: Payer: Self-pay

## 2021-06-29 ENCOUNTER — Ambulatory Visit: Payer: Self-pay | Attending: Family Medicine | Admitting: Clinical

## 2021-06-29 DIAGNOSIS — F331 Major depressive disorder, recurrent, moderate: Secondary | ICD-10-CM

## 2021-06-29 DIAGNOSIS — F333 Major depressive disorder, recurrent, severe with psychotic symptoms: Secondary | ICD-10-CM

## 2021-07-01 ENCOUNTER — Other Ambulatory Visit: Payer: Self-pay

## 2021-07-01 ENCOUNTER — Ambulatory Visit: Payer: Self-pay | Attending: Family Medicine

## 2021-07-01 ENCOUNTER — Ambulatory Visit: Payer: Self-pay | Attending: Family Medicine | Admitting: Pharmacist

## 2021-07-01 DIAGNOSIS — K219 Gastro-esophageal reflux disease without esophagitis: Secondary | ICD-10-CM

## 2021-07-01 DIAGNOSIS — E785 Hyperlipidemia, unspecified: Secondary | ICD-10-CM

## 2021-07-01 DIAGNOSIS — E559 Vitamin D deficiency, unspecified: Secondary | ICD-10-CM

## 2021-07-01 DIAGNOSIS — F419 Anxiety disorder, unspecified: Secondary | ICD-10-CM

## 2021-07-01 DIAGNOSIS — F32A Depression, unspecified: Secondary | ICD-10-CM

## 2021-07-01 DIAGNOSIS — I10 Essential (primary) hypertension: Secondary | ICD-10-CM

## 2021-07-01 DIAGNOSIS — M2550 Pain in unspecified joint: Secondary | ICD-10-CM

## 2021-07-01 DIAGNOSIS — D500 Iron deficiency anemia secondary to blood loss (chronic): Secondary | ICD-10-CM

## 2021-07-01 MED ORDER — LOSARTAN POTASSIUM 25 MG PO TABS
25.0000 mg | ORAL_TABLET | Freq: Every day | ORAL | 2 refills | Status: DC
  Filled 2021-07-01: qty 30, 30d supply, fill #0
  Filled 2021-09-09: qty 30, 30d supply, fill #1
  Filled 2021-10-23: qty 30, 30d supply, fill #2
  Filled 2022-03-08: qty 30, 30d supply, fill #3
  Filled 2022-04-05: qty 30, 30d supply, fill #4

## 2021-07-01 MED ORDER — HYDROXYZINE HCL 10 MG PO TABS
10.0000 mg | ORAL_TABLET | Freq: Three times a day (TID) | ORAL | 1 refills | Status: DC | PRN
  Filled 2021-07-01: qty 60, 20d supply, fill #0
  Filled 2021-09-09: qty 60, 20d supply, fill #1

## 2021-07-01 MED ORDER — HYDROCHLOROTHIAZIDE 12.5 MG PO CAPS
12.5000 mg | ORAL_CAPSULE | Freq: Every day | ORAL | 3 refills | Status: DC
  Filled 2021-07-01: qty 30, 30d supply, fill #0

## 2021-07-01 MED ORDER — DULOXETINE HCL 60 MG PO CPEP
60.0000 mg | ORAL_CAPSULE | Freq: Every day | ORAL | 1 refills | Status: DC
  Filled 2021-07-01: qty 30, 30d supply, fill #0

## 2021-07-01 MED ORDER — VITAMIN D (ERGOCALCIFEROL) 1.25 MG (50000 UNIT) PO CAPS
ORAL_CAPSULE | ORAL | 2 refills | Status: DC
  Filled 2021-07-01: qty 4, 28d supply, fill #0
  Filled 2021-08-12 – 2021-09-09 (×2): qty 4, 28d supply, fill #1

## 2021-07-01 MED ORDER — FAMOTIDINE 20 MG PO TABS
40.0000 mg | ORAL_TABLET | Freq: Every day | ORAL | 2 refills | Status: DC
  Filled 2021-07-01: qty 60, 30d supply, fill #0
  Filled 2021-12-28: qty 60, 30d supply, fill #1
  Filled 2022-03-08: qty 60, 30d supply, fill #2

## 2021-07-01 MED ORDER — CLOPIDOGREL BISULFATE 75 MG PO TABS
75.0000 mg | ORAL_TABLET | Freq: Every day | ORAL | 2 refills | Status: DC
  Filled 2021-07-01: qty 30, 30d supply, fill #0
  Filled 2021-09-09: qty 30, 30d supply, fill #1
  Filled 2021-10-23: qty 30, 30d supply, fill #2

## 2021-07-01 MED ORDER — METOPROLOL TARTRATE 50 MG PO TABS
50.0000 mg | ORAL_TABLET | Freq: Two times a day (BID) | ORAL | 3 refills | Status: DC
  Filled 2021-07-01: qty 60, 30d supply, fill #0
  Filled 2021-09-09: qty 60, 30d supply, fill #1
  Filled 2021-10-23: qty 60, 30d supply, fill #2
  Filled 2021-12-28: qty 60, 30d supply, fill #3

## 2021-07-01 MED ORDER — FERROUS SULFATE 325 (65 FE) MG PO TABS
ORAL_TABLET | ORAL | 2 refills | Status: DC
  Filled 2021-07-01: qty 60, 30d supply, fill #0
  Filled 2021-09-09: qty 60, 30d supply, fill #1
  Filled 2021-10-23: qty 60, 30d supply, fill #2

## 2021-07-01 MED ORDER — ATORVASTATIN CALCIUM 40 MG PO TABS
40.0000 mg | ORAL_TABLET | Freq: Every day | ORAL | 2 refills | Status: DC
  Filled 2021-07-01: qty 30, 30d supply, fill #0
  Filled 2021-09-09: qty 30, 30d supply, fill #1
  Filled 2021-10-23: qty 30, 30d supply, fill #2

## 2021-07-01 NOTE — Progress Notes (Signed)
° °  S:    PCP: Dr. Margarita Rana   Patient arrives in good spirits. Presents to the clinic for hypertension evaluation, counseling, and management. Patient was referred and last seen by Freeman Caldron on 06/03/2021. Her blood pressure was elevated at that visit. HCTZ was added to her regimen.   Medication adherence: pt wasn't able to pick up her rxs after last visit. Never started the HCTZ.   Current BP Medications include:  losartan 25 mg daily, HCTZ 12.5 mg daily, metoprolol tartrate 50 mg BID  Dietary habits include: does not add salt at the table. Does not eat out at fast food restaurants or take out.  Exercise habits include: not able to exercise due to pain. Family / Social history:  Fhx: HTN Tobacco: some day smoker  Alcohol: none   O:  Today's Vitals   07/01/21 1000  BP: (!) 161/114  Pulse: 82   There is no height or weight on file to calculate BMI.  Home BP readings: none   Last 3 Office BP readings: BP Readings from Last 3 Encounters:  07/01/21 (!) 161/114  06/03/21 (!) 143/86  04/03/21 (!) 163/105    BMET    Component Value Date/Time   NA 136 04/02/2021 2034   NA 142 02/09/2021 1203   K 3.7 04/02/2021 2034   CL 105 04/02/2021 2034   CO2 24 04/02/2021 2034   GLUCOSE 100 (H) 04/02/2021 2034   BUN 11 04/02/2021 2034   BUN 11 02/09/2021 1203   CREATININE 0.50 04/02/2021 2034   CALCIUM 9.1 04/02/2021 2034   GFRNONAA >60 04/02/2021 2034   GFRAA 124 07/15/2020 1146    Renal function: CrCl cannot be calculated (Patient's most recent lab result is older than the maximum 21 days allowed.).  Clinical ASCVD: Yes  The ASCVD Risk score (Arnett DK, et al., 2019) failed to calculate for the following reasons:   The patient has a prior MI or stroke diagnosis   A/P: Hypertension diagnosed currently uncontrolled on current medications. BP Goal = < 130/80 mmHg. Medication adherence suboptimal. Will work with our pharmacy to get the patient the medication she needs.    -Continued current regimen. Refills sent for all medications.   -Counseled on lifestyle modifications for blood pressure control including reduced dietary sodium, increased exercise, adequate sleep.  Results reviewed and written information provided.   Total time in face-to-face counseling 30 minutes.   F/U Clinic Visit in 1 month.    Benard Halsted, PharmD, [facility] [place], [place] [phone]
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
## 2021-07-13 ENCOUNTER — Ambulatory Visit: Payer: Self-pay | Attending: Family Medicine

## 2021-07-20 ENCOUNTER — Ambulatory Visit: Payer: Self-pay | Admitting: Clinical

## 2021-07-20 ENCOUNTER — Ambulatory Visit: Payer: Self-pay | Admitting: *Deleted

## 2021-07-20 NOTE — Telephone Encounter (Signed)
Reason for Disposition  [1] Pain radiates into the thigh or further down the leg AND [2] one leg  Answer Assessment - Initial Assessment Questions 1. ONSET: "When did the pain begin?"      C/o back pain and pain in both logs.   Going on a long time.   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   My lower back hurting and my legs hurting rea bad.     2. LOCATION: "Where does it hurt?" (upper, mid or lower back)     Lower back and mainly right leg.     I'm having swelling mainly in right knee.  It has fluid on it.   My ankles are swollen too.   Been going on for a while the swelling in my ankles.   I'm on fluid pills but they don't seem to be helping. I was taking Aleve and Tramadol for the back pain.   The pain is worse.    3. SEVERITY: "How bad is the pain?"  (e.g., Scale 1-10; mild, moderate, or severe)   - MILD (1-3): doesn't interfere with normal activities    - MODERATE (4-7): interferes with normal activities or awakens from sleep    - SEVERE (8-10): excruciating pain, unable to do any normal activities      *No Answer* 4. PATTERN: "Is the pain constant?" (e.g., yes, no; constant, intermittent)      *No Answer* 5. RADIATION: "Does the pain shoot into your legs or elsewhere?"     *No Answer* 6. CAUSE:  "What do you think is causing the back pain?"      Work  I stand and make biscuits too. 7. BACK OVERUSE:  "Any recent lifting of heavy objects, strenuous work or exercise?"     *No Answer* 8. MEDICATIONS: "What have you taken so far for the pain?" (e.g., nothing, acetaminophen, NSAIDS)     *No Answer* 9. NEUROLOGIC SYMPTOMS: "Do you have any weakness, numbness, or problems with bowel/bladder control?"     *No Answer* 10. OTHER SYMPTOMS: "Do you have any other symptoms?" (e.g., fever, abdominal pain, burning with urination, blood in urine)       *No Answer* 11. 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   Having worsening lower back pain with bilateral leg pain more so in right leg and right knee. Frequency: "For a while now" but it's getting worse Pertinent Negatives: Patient denies not being able to walk.   Using Aleve and Tramadol with some relief. Disposition: [] ED /[] Urgent Care (no appt availability in office) / [] Appointment(In office/virtual)/ []  [facility]/ [] Home Care/ [] Refused Recommended Disposition /[] [facility]/ [x]  Follow-up with PCP Additional Notes: There are no appts available with [facility].  Pt was agreeable to someone calling her back to see if she can be worked in.     I returned pt's call.    She called in c/o back pain and pain in  both legs along with swelling in her knees from standing at work.

## 2021-07-20 NOTE — Telephone Encounter (Signed)
Scheduled apt with PCP on 07/28/2021 at 1110 Patient aware to go to UC if pain progresses.

## 2021-07-28 ENCOUNTER — Other Ambulatory Visit: Payer: Self-pay

## 2021-07-28 ENCOUNTER — Ambulatory Visit: Payer: Self-pay | Attending: Family Medicine | Admitting: Family Medicine

## 2021-07-28 ENCOUNTER — Encounter: Payer: Self-pay | Admitting: Family Medicine

## 2021-07-28 VITALS — BP 160/110 | HR 97 | Ht 67.0 in | Wt 204.0 lb

## 2021-07-28 DIAGNOSIS — I10 Essential (primary) hypertension: Secondary | ICD-10-CM

## 2021-07-28 DIAGNOSIS — F32A Depression, unspecified: Secondary | ICD-10-CM

## 2021-07-28 DIAGNOSIS — M170 Bilateral primary osteoarthritis of knee: Secondary | ICD-10-CM

## 2021-07-28 DIAGNOSIS — M545 Low back pain, unspecified: Secondary | ICD-10-CM

## 2021-07-28 DIAGNOSIS — Z8673 Personal history of transient ischemic attack (TIA), and cerebral infarction without residual deficits: Secondary | ICD-10-CM

## 2021-07-28 DIAGNOSIS — G8929 Other chronic pain: Secondary | ICD-10-CM

## 2021-07-28 DIAGNOSIS — F419 Anxiety disorder, unspecified: Secondary | ICD-10-CM

## 2021-07-28 MED ORDER — BACLOFEN 10 MG PO TABS
10.0000 mg | ORAL_TABLET | Freq: Two times a day (BID) | ORAL | 3 refills | Status: DC
  Filled 2021-07-28: qty 60, 30d supply, fill #0
  Filled 2021-09-09: qty 60, 30d supply, fill #1
  Filled 2021-10-23: qty 60, 30d supply, fill #2
  Filled 2021-12-28: qty 60, 30d supply, fill #3

## 2021-07-28 MED ORDER — MELOXICAM 7.5 MG PO TABS
7.5000 mg | ORAL_TABLET | Freq: Every day | ORAL | 3 refills | Status: DC
Start: 2021-07-28 — End: 2022-03-16
  Filled 2021-07-28: qty 30, 30d supply, fill #0
  Filled 2021-09-09: qty 30, 30d supply, fill #1
  Filled 2021-10-23: qty 30, 30d supply, fill #2
  Filled 2021-12-28: qty 30, 30d supply, fill #3

## 2021-07-28 MED ORDER — PREDNISONE 20 MG PO TABS
20.0000 mg | ORAL_TABLET | Freq: Every day | ORAL | 0 refills | Status: DC
Start: 2021-07-28 — End: 2021-10-06
  Filled 2021-07-28: qty 5, 5d supply, fill #0

## 2021-07-28 MED ORDER — LIDOCAINE 5 % EX PTCH
1.0000 | MEDICATED_PATCH | CUTANEOUS | 1 refills | Status: DC
Start: 2021-07-28 — End: 2022-03-16
  Filled 2021-07-28: qty 30, 30d supply, fill #0
  Filled 2022-03-08: qty 30, 30d supply, fill #1

## 2021-07-28 MED ORDER — CHLORTHALIDONE 25 MG PO TABS
25.0000 mg | ORAL_TABLET | Freq: Every day | ORAL | 3 refills | Status: DC
  Filled 2021-07-28: qty 30, 30d supply, fill #0
  Filled 2021-09-09: qty 30, 30d supply, fill #1
  Filled 2021-10-23: qty 30, 30d supply, fill #2
  Filled 2021-12-28: qty 30, 30d supply, fill #3

## 2021-07-28 NOTE — Progress Notes (Signed)
Lower back and leg pain.

## 2021-07-28 NOTE — Progress Notes (Signed)
Subjective:  Patient ID: Tina Moss, female    DOB: [birth year]  Age: 51 y.o. MRN: [id number]  CC: Back Pain   HPI Tina Moss is a 51 y.o. year old female with a history of seizures (seizure - free for the last 5 years and is not currently on antiseizure medication), multiple sclerosis, L MCA CVA in 03/2017  who presents today for follow-up visit.  Interval History: She has a lot of pain in her low back, b/l hips, has swelling in her knees. She also has ankle and knee pain. Complains of inability to stand too long due to pain She complains of an episode where she was driving to work at night and had numbness in her right hand and right leg which resolved spontaneously.  She is currently on Cymbalta but tells me she was on tramadol in the past but this does not appear on her med list.  Symptoms have been present for a while and she has been using Tramadol and Aleve. Pain is 8/10 and at work she stands on cement to make biscuits, do dishes. Pain is like a circle around her her lower back. She feels like she is about to fall sometimes after standing for too long at work. Right knee x-ray from 08/2019 revealed mild right knee osteoarthritis. Sacral x-ray from 08/2019 was unremarkable Lumbar spine x-ray from 08/2019 revealed no radiographic evidence for acute traumatic injury CT right lower extremity from 09/2019 revealed: IMPRESSION: 1. No evidence of acute right femur fracture or dislocation. 2. 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 Right knee Baker's cyst. 3. Osteitis pubis.  Her blood pressure is elevated and she endorses taking her antihypertensive today. Past Medical History:  Diagnosis Date   Anemia    Epilepsy ([place])    Fibroids    H/O bacterial infection    H/O mumps    Hyperlipidemia    Hypertension    Seizures ([place])     Past Surgical History:  Procedure Laterality Date   CESAREAN SECTION     x2. at term   TEE WITHOUT CARDIOVERSION N/A 04/06/2018    Procedure: TRANSESOPHAGEAL ECHOCARDIOGRAM (TEE) BUBBLE STUDY;  Surgeon: Lelon Perla, MD;  Location: [facility] ENDOSCOPY;  Service: Cardiovascular;  Laterality: N/A;   TUBAL LIGATION     WISDOM TOOTH EXTRACTION      Family History  Problem Relation Age of Onset   Hypertension Mother    Hypertension Sister    Arthritis Sister        knees   Hypertension Brother    Deafness Brother    Speech disorder Brother        mute   Mental illness Brother        "he can just fly off"   Mental illness Daughter        ? bipolar   Scoliosis Son     No Known Allergies  Outpatient Medications Prior to Visit  Medication Sig Dispense Refill   atorvastatin (LIPITOR) 40 MG tablet Take 1 tablet (40 mg total) by mouth daily. at 6pm. NEEDS PASS 30 tablet 2   clopidogrel (PLAVIX) 75 MG tablet Take 1 tablet (75 mg total) by mouth daily. 30 tablet 2   DULoxetine (CYMBALTA) 60 MG capsule Take 1 capsule (60 mg total) by mouth daily. 30 capsule 1   famotidine (PEPCID) 20 MG tablet Take 2 tablets (40 mg total) by mouth daily. 60 tablet 2   ferrous sulfate 325 (65 FE) MG tablet TAKE 1  TABLET (325 MG TOTAL) BY MOUTH 2 (TWO) TIMES DAILY WITH A MEAL. 60 tablet 2   hydrOXYzine (ATARAX) 10 MG tablet Take 1 tablet (10 mg total) by mouth 3 (three) times daily as needed. 60 tablet 1   losartan (COZAAR) 25 MG tablet Take 1 tablet (25 mg total) by mouth daily 90 tablet 2   metoprolol tartrate (LOPRESSOR) 50 MG tablet Take 1 tablet (50 mg total) by mouth 2 (two) times daily. 60 tablet 3   Vitamin D, Ergocalciferol, (DRISDOL) 1.25 MG (50000 UNIT) CAPS capsule TAKE 1 CAPSULE (50,000 UNITS TOTAL) BY MOUTH EVERY 7 (SEVEN) DAYS. 4 capsule 2   hydrochlorothiazide (MICROZIDE) 12.5 MG capsule Take 1 capsule (12.5 mg total) by mouth daily. 30 capsule 3   No facility-administered medications prior to visit.     ROS Review of Systems  Constitutional:  Negative for activity change, appetite change and fatigue.  HENT:  Negative  for congestion, sinus pressure and sore throat.   Eyes:  Negative for visual disturbance.  Respiratory:  Negative for cough, chest tightness, shortness of breath and wheezing.   Cardiovascular:  Negative for chest pain and palpitations.  Gastrointestinal:  Negative for abdominal distention, abdominal pain and constipation.  Endocrine: Negative for polydipsia.  Genitourinary:  Negative for dysuria and frequency.  Musculoskeletal:        See HPI  Skin:  Negative for rash.  Neurological:  Positive for numbness. Negative for tremors and light-headedness.  Hematological:  Does not bruise/bleed easily.  Psychiatric/Behavioral:  Negative for agitation and behavioral problems.    Objective:  BP (!) 160/110    Pulse 97    Ht 5\' 7"  (1.702 m)    Wt 204 lb (92.5 kg)    SpO2 97%    BMI 31.95 kg/m   BP/Weight 07/28/2021 07/01/2021 63/14/9702  Systolic BP 637 858 850  Diastolic BP 277 412 86  Wt. (Lbs) 204 - 197.4  BMI 31.95 - 31.38      Physical Exam Constitutional:      Appearance: She is well-developed.  Cardiovascular:     Rate and Rhythm: Normal rate.     Heart sounds: Normal heart sounds. No murmur heard. Pulmonary:     Effort: Pulmonary effort is normal.     Breath sounds: Normal breath sounds. No wheezing or rales.  Chest:     Chest wall: No tenderness.  Abdominal:     General: Bowel sounds are normal. There is no distension.     Palpations: Abdomen is soft. There is no mass.     Tenderness: There is no abdominal tenderness.  Musculoskeletal:     Right lower leg: No edema.     Left lower leg: No edema.     Comments: Tenderness on palpation across lumbar spine Unable to perform straight leg raise due to difficulty maintaining supine position Tenderness on flexion and extension of right knee but absent on range of motion of left knee  Neurological:     Mental Status: She is alert and oriented to person, place, and time.  Psychiatric:        Mood and Affect: Mood normal.     CMP Latest Ref Rng & Units 04/02/2021 02/09/2021 07/15/2020  Glucose 70 - 99 mg/dL 100(H) 97 102(H)  BUN 6 - 20 mg/dL 11 11 10   Creatinine 0.44 - 1.00 mg/dL 0.50 0.73 0.57  Sodium 135 - 145 mmol/L 136 142 140  Potassium 3.5 - 5.1 mmol/L 3.7 4.6 3.7  Chloride 98 - 111  mmol/L 105 105 105  CO2 22 - 32 mmol/L 24 24 -  Calcium 8.9 - 10.3 mg/dL 9.1 9.5 9.5  Total Protein 6.0 - 8.5 g/dL - 7.6 7.3  Total Bilirubin 0.0 - 1.2 mg/dL - 0.3 0.5  Alkaline Phos 44 - 121 IU/L - 135(H) 128(H)  AST 0 - 40 IU/L - 13 18  ALT 0 - 32 IU/L - 16 -    Lipid Panel     Component Value Date/Time   CHOL 216 (H) 02/09/2021 1203   TRIG 101 02/09/2021 1203   HDL 61 02/09/2021 1203   CHOLHDL 3.5 02/09/2021 1203   CHOLHDL 2.9 04/05/2018 0535   VLDL 13 04/05/2018 0535   LDLCALC 137 (H) 02/09/2021 1203    CBC    Component Value Date/Time   WBC 3.8 (L) 04/02/2021 2034   RBC 4.39 04/02/2021 2034   HGB 13.7 04/02/2021 2034   HGB 14.7 02/09/2021 1203   HCT 41.3 04/02/2021 2034   HCT 44.0 02/09/2021 1203   PLT 228 04/02/2021 2034   PLT 263 02/09/2021 1203   MCV 94.1 04/02/2021 2034   MCV 93 02/09/2021 1203   MCH 31.2 04/02/2021 2034   MCHC 33.2 04/02/2021 2034   RDW 12.0 04/02/2021 2034   RDW 13.4 02/09/2021 1203   LYMPHSABS 1.4 04/02/2021 2034   LYMPHSABS 1.3 02/09/2021 1203   MONOABS 0.4 04/02/2021 2034   EOSABS 0.1 04/02/2021 2034   EOSABS 0.1 02/09/2021 1203   BASOSABS 0.0 04/02/2021 2034   BASOSABS 0.0 02/09/2021 1203    Lab Results  Component Value Date   HGBA1C 5.9 (H) 04/05/2018    Assessment & Plan:  1. Essential hypertension Uncontrolled Switch from HCTZ 12.5 mg to chlorthalidone She has an upcoming visit with the clinical pharmacist for BP evaluation Counseled on blood pressure goal of less than 130/80, low-sodium, DASH diet, medication compliance, 150 minutes of moderate intensity exercise per week. Discussed medication compliance, adverse effects. - chlorthalidone (HYGROTON)  25 MG tablet; Take 1 tablet (25 mg total) by mouth daily.  Dispense: 30 tablet; Refill: 3 - Basic Metabolic Panel  2. Primary osteoarthritis of both knees Uncontrolled Secondary to osteoarthritis She will benefit from cortisone injections Advised to obtain paperwork for the [facility] discount at the front desk so referral for orthopedic services and further work-up can be performed - meloxicam (MOBIC) 7.5 MG tablet; Take 1 tablet (7.5 mg total) by mouth daily.  Dispense: 30 tablet; Refill: 3 - predniSONE (DELTASONE) 20 MG tablet; Take 1 tablet (20 mg total) by mouth daily with breakfast.  Dispense: 5 tablet; Refill: 0  3. Chronic midline low back pain without sciatica Uncontrolled Chart reveals a previous history of multiple sclerosis Would love to offer order MRI and refer to neurology after she has obtained her current financial discount given she has no medical coverage. Advised to apply heat or ice whichever is tolerated to painful areas. Counseled on evidence of improvement in pain control with regards to yoga, water aerobics, massage, home physical therapy, exercise as tolerated. - baclofen (LIORESAL) 10 MG tablet; Take 1 tablet (10 mg total) by mouth 2 (two) times daily.  Dispense: 60 each; Refill: 3 - predniSONE (DELTASONE) 20 MG tablet; Take 1 tablet (20 mg total) by mouth daily with breakfast.  Dispense: 5 tablet; Refill: 0 - lidocaine (LIDODERM) 5 %; Place 1 patch onto the skin daily. Remove & Discard patch within 12 hours or as directed by MD  Dispense: 30 patch; Refill: 1  4. History of Left middle cerebral artery stroke No residual deficits Currently on Plavix Continue statin Risk factor modification  5. Anxiety and depression Worsened by underlying pain Currently on Cymbalta   Meds ordered this encounter  Medications   meloxicam (MOBIC) 7.5 MG tablet    Sig: Take 1 tablet (7.5 mg total) by mouth daily.    Dispense:  30 tablet    Refill:  3   baclofen  (LIORESAL) 10 MG tablet    Sig: Take 1 tablet (10 mg total) by mouth 2 (two) times daily.    Dispense:  60 each    Refill:  3   chlorthalidone (HYGROTON) 25 MG tablet    Sig: Take 1 tablet (25 mg total) by mouth daily.    Dispense:  30 tablet    Refill:  3    Discontinue HCTZ   predniSONE (DELTASONE) 20 MG tablet    Sig: Take 1 tablet (20 mg total) by mouth daily with breakfast.    Dispense:  5 tablet    Refill:  0   lidocaine (LIDODERM) 5 %    Sig: Place 1 patch onto the skin daily. Remove & Discard patch within 12 hours or as directed by MD    Dispense:  30 patch    Refill:  1    Follow-up: Return in about 6 weeks (around 09/08/2021) for Complete physical exam.       Charlott Rakes, MD, FAAFP. [facility], [place]   07/28/2021, 12:17 PM

## 2021-07-28 NOTE — Patient Instructions (Signed)
Chronic Knee Pain, Adult °Chronic knee pain is pain in one or both knees that lasts longer than 3 months. Symptoms of chronic knee pain may include swelling, stiffness, and discomfort. Age-related wear and tear (osteoarthritis) of the knee joint is the most common cause of chronic knee pain. Other possible causes include: °A long-term immune-related disease that causes inflammation of the knee (rheumatoid arthritis). This usually affects both knees. °Inflammatory arthritis, such as gout or pseudogout. °An injury to the knee that causes arthritis. °An injury to the knee that damages the ligaments. Ligaments are strong tissues that connect bones to each other. °Runner's knee or pain behind the kneecap. °Treatment for chronic knee pain depends on the cause. The main treatments for chronic knee pain are physical therapy and weight loss. This condition may also be treated with medicines, injections, a knee sleeve or brace, and by using crutches. Rest, ice, pressure (compression), and elevation, also known as RICE therapy, may also be recommended. °Follow these instructions at home: °If you have a knee sleeve or brace: ° °Wear the knee sleeve or brace as told by your health care provider. Remove it only as told by your health care provider. °Loosen it if your toes tingle, become numb, or turn cold and blue. °Keep it clean. °If the sleeve or brace is not waterproof: °Do not let it get wet. °Remove it if allowed by your health care provider, or cover it with a watertight covering when you take a bath or a shower. °Managing pain, stiffness, and swelling °  °If directed, apply heat to the affected area as often as told by your health care provider. Use the heat source that your health care provider recommends, such as a moist heat pack or a heating pad. °If you have a removable knee sleeve or brace, remove it as told by your health care provider. °Place a towel between your skin and the heat source. °Leave the heat on for  20-30 minutes. °Remove the heat if your skin turns bright red. This is especially important if you are unable to feel pain, heat, or cold. You may have a greater risk of getting burned. °If directed, put ice on the affected area. To do this: °If you have a removable knee sleeve or brace, remove it as told by your health care provider. °Put ice in a plastic bag. °Place a towel between your skin and the bag. °Leave the ice on for 20 minutes, 2-3 times a day. °Remove the ice if your skin turns bright red. This is very important. If you cannot feel pain, heat, or cold, you have a greater risk of damage to the area. °Move your toes often to reduce stiffness and swelling. °Raise (elevate) the injured area above the level of your heart while you are sitting or lying down. °Activity °Avoid high-impact activities or exercises, such as running, jumping rope, or doing jumping jacks. °Follow the exercise plan that your health care provider designed for you. Your health care provider may suggest that you: °Avoid activities that make knee pain worse. This may require you to change your exercise routines, sport participation, or job duties. °Wear shoes with cushioned soles. °Avoid sports that require running and sudden changes in direction. °Do physical therapy. Physical therapy is planned to match your needs and abilities. It may include exercises for strength, flexibility, stability, and endurance. °Do exercises that increase balance and strength, such as tai chi and yoga. °Do not use the injured limb to support your body weight   until your health care provider says that you can. Use crutches as told by your health care provider. °Return to your normal activities as told by your health care provider. Ask your health care provider what activities are safe for you. °General instructions °Take over-the-counter and prescription medicines only as told by your health care provider. °Lose weight if you are overweight. Losing even a  little weight can reduce knee pain. Ask your health care provider what your ideal weight is, and how to safely lose extra weight. A dietitian may be able to help you plan your meals. °Do not use any products that contain nicotine or tobacco, such as cigarettes, e-cigarettes, and chewing tobacco. These can delay healing. If you need help quitting, ask your health care provider. °Keep all follow-up visits. This is important. °Contact a health care provider if: °You have knee pain that is not getting better or gets worse. °You are unable to do your physical therapy exercises due to knee pain. °Get help right away if: °Your knee swells and the swelling becomes worse. °You cannot move your knee. °You have severe knee pain. °Summary °Knee pain that lasts more than 3 months is considered chronic knee pain. °The main treatments for chronic knee pain are physical therapy and weight loss. You may also need to take medicines, wear a knee sleeve or brace, use crutches, and apply ice or heat. °Losing even a little weight can reduce knee pain. Ask your health care provider what your ideal weight is, and how to safely lose extra weight. A dietitian may be able to help you plan your meals. °Follow the exercise plan that your health care provider designed for you. °This information is not intended to replace advice given to you by your health care provider. Make sure you discuss any questions you have with your health care provider. °Document Revised: 11/14/2019 Document Reviewed: 11/14/2019 °Elsevier Patient Education © 2022 [employer] ° °

## 2021-07-29 LAB — BASIC METABOLIC PANEL
BUN/Creatinine Ratio: 18 (ref 9–23)
BUN: 13 mg/dL (ref 6–24)
CO2: 21 mmol/L (ref 20–29)
Calcium: 9.1 mg/dL (ref 8.7–10.2)
Chloride: 104 mmol/L (ref 96–106)
Creatinine, Ser: 0.71 mg/dL (ref 0.57–1.00)
Glucose: 90 mg/dL (ref 70–99)
Potassium: 4.2 mmol/L (ref 3.5–5.2)
Sodium: 141 mmol/L (ref 134–144)
eGFR: 102 mL/min/{1.73_m2} (ref >59)

## 2021-07-30 ENCOUNTER — Telehealth: Payer: Self-pay

## 2021-07-30 NOTE — Telephone Encounter (Signed)
Pt was called and Vm was left informing patient of normal lab results.  Pt has been sent a mychart message as well.

## 2021-07-30 NOTE — Telephone Encounter (Signed)
Pt requesting a call back in regards to her labs work.

## 2021-07-30 NOTE — Telephone Encounter (Signed)
Called pt unable to reach left VM to call back

## 2021-07-30 NOTE — Telephone Encounter (Signed)
-----   Message from Charlott Rakes, MD sent at 07/29/2021 12:09 PM EST ----- Please inform the patient that labs are normal. Thank you.

## 2021-07-31 ENCOUNTER — Other Ambulatory Visit: Payer: Self-pay

## 2021-08-03 ENCOUNTER — Ambulatory Visit: Payer: Self-pay | Admitting: Pharmacist

## 2021-08-05 ENCOUNTER — Ambulatory Visit: Payer: Self-pay | Attending: Family Medicine | Admitting: Clinical

## 2021-08-05 ENCOUNTER — Encounter: Payer: Self-pay | Admitting: Family Medicine

## 2021-08-05 ENCOUNTER — Other Ambulatory Visit: Payer: Self-pay

## 2021-08-05 DIAGNOSIS — F331 Major depressive disorder, recurrent, moderate: Secondary | ICD-10-CM

## 2021-08-10 NOTE — BH Specialist Note (Signed)
Integrated Behavioral Health Follow Up In-Person Visit  MRN: [id number] Name: Tina Moss  Number of [place] Clinician visits: 2- Second Visit  Session Start time: 1440   Session End time: 2119  Total time in minutes: 30   Types of Service: Individual psychotherapy  Interpretor:No. Interpretor Name and Language: N/A  Subjective: Tina Moss is a 51 y.o. female accompanied by  self Patient was referred by Charlott Rakes, MD for depression and anxiety. Patient reports the following symptoms/concerns: Reports feeling depressed, decreased motivation, trouble sleeping, worrying, anxiousness, and irritability. Reports that she experiences pain and difficulty standing for extending periods of time. Reports that she wants a new job as she is experiencing problems at work. Reports wanting a job where she can sit down but not knowing much about computers. Duration of problem: 1 year; Severity of problem: moderate  Objective: Mood: Anxious and Depressed and Affect: Appropriate Risk of harm to self or others: No plan to harm self or others  Life Context: Family and Social: Pt stays with sister. Pt receives support from family. School/Work: Pt is employed part time. Self-Care: Denies substance use. Pt currently prescribed cymbalta.  Life Changes: Pt has experienced significant trauma. Pt is experiencing pain and problems at work. Pt wants new employment.   Patient and/or Family's Strengths/Protective Factors: Concrete supports in place (healthy food, safe environments, etc.)  Goals Addressed: Patient will:  Reduce symptoms of: anxiety and depression   Increase knowledge and/or ability of: coping skills   Demonstrate ability to: Increase healthy adjustment to current life circumstances  Progress towards Goals: Ongoing  Interventions: Interventions utilized:  Mindfulness or Psychologist, educational, CBT Cognitive Behavioral Therapy, Supportive Counseling, and  Problem Solving Skills Standardized Assessments completed: Not Needed  Patient and/or Family Response: Pt receptive to tx. Pt receptive to psychoeducation provided on depression and anxiety. Pt receptive to assistance with problem solving in order to help pt with concerns at work. Pt receptive to deep breathing exercises and resources on computer assistance.   Patient Centered Plan: Patient is on the following Treatment Plan(s): Depression and anxiety  Assessment: Denies SI/HI. Patient currently experiencing depression and anxiety. Pt appears to be experiencing problems at work which is contributing to pt's anxiety. Pt desires a new job but is experiencing physical health problems with pain while standing. Pt has informed her PCP of these concerns.   Patient may benefit from resources to assist with understanding computers. LCSW provided resource on free computer classes to assist pt with searching for a new job and having a job where she is standing less. LCSW provided psychoeducation on depression and anxiety. LCSW utilized Adult nurse. LCSW encouraged pt to utilize deep breathing. LCSW will fu with pt.  Plan: Follow up with behavioral health clinician on : 09/02/21 Behavioral recommendations: Utilize deep breathing. Referral(s): [place] (In Clinic) "From scale of 1-10, how likely are you to follow plan?": 10  Briyan Kleven C Dai Mcadams, LCSW

## 2021-08-12 ENCOUNTER — Ambulatory Visit: Payer: Self-pay | Admitting: Pharmacist

## 2021-08-12 ENCOUNTER — Other Ambulatory Visit: Payer: Self-pay

## 2021-08-19 ENCOUNTER — Other Ambulatory Visit: Payer: Self-pay

## 2021-09-02 ENCOUNTER — Ambulatory Visit: Payer: Self-pay | Attending: Family Medicine | Admitting: Clinical

## 2021-09-02 ENCOUNTER — Other Ambulatory Visit: Payer: Self-pay

## 2021-09-02 DIAGNOSIS — F331 Major depressive disorder, recurrent, moderate: Secondary | ICD-10-CM

## 2021-09-04 NOTE — BH Specialist Note (Signed)
Integrated Behavioral Health Follow Up In-Person Visit ? ?MRN: [id number] ?Name: Tina Moss ? ?Number of [place] Clinician visits: 3- Third Visit ? ?Session Start time: 1430 ?  ?Session End time: 1500 ? ?Total time in minutes: 30 ? ? ?Types of Service: Individual psychotherapy ? ?Interpretor:No. Interpretor Name and Language: N/A ? ?Subjective: ?ANDRETTA ERGLE is a 51 y.o. female accompanied by  self ?Patient was referred by Charlott Rakes, MD for depression and anxiety. ?Patient reports the following symptoms/concerns: Reports feeling depressed. Pt is currently worried about her mother who is in the hospital. Reports that she has not been sleeping. Reports her family has not been supportive.  ?Duration of problem: 1 year; Severity of problem: moderate ? ?Objective: ?Mood: Anxious and Depressed and Affect: Appropriate ?Risk of harm to self or others: No plan to harm self or others ? ?Life Context: ?Family and Social: Pt stays with sister. Pt receives support from family. ?School/Work: Pt is employed part time. ?Self-Care: Denies substance use. Pt currently prescribed cymbalta.  ?Life Changes: Pt has experienced significant trauma. Pt is experiencing pain and problems at work. Pt wants new employment. ?(No changes to life context)  ? ?Patient and/or Family's Strengths/Protective Factors: ?Concrete supports in place (healthy food, safe environments, etc.) ? ?Goals Addressed: ?Patient will: ? Reduce symptoms of: anxiety and depression  ? Increase knowledge and/or ability of: coping skills  ? Demonstrate ability to: Increase healthy adjustment to current life circumstances ? ?Progress towards Goals: ?Ongoing ? ?Interventions: ?Interventions utilized:  Optician, dispensing, CBT Cognitive Behavioral Therapy, and Supportive Counseling ?Standardized Assessments completed: Not Needed ? ?Patient and/or Family Response: Pt receptive to tx. Pt receptive to cognitive restructuring. Pt  receptive to incorporating self-care and deep breathing.  ? ?Patient Centered Plan: ?Patient is on the following Treatment Plan(s): Depression and anxiety ? ?Assessment: ?Denies SI/HI. Patient currently experiencing depression and anxiety. Pt experiencing worries related to her mother's health and has not been sleeping. .  ? ?Patient may benefit from incorporating self-care. LCSW utilized Adult nurse. LCSW encouraged pt to incorporate self-care and deep breathing. ? ?Plan: ?Follow up with behavioral health clinician on : 09/23/21 ?Behavioral recommendations: Incorporate self-care and deep breathing ?Referral(s): [place] (In Clinic) ?"From scale of 1-10, how likely are you to follow plan?": 10 ? ?Muriah Harsha C Fielding [place], LCSW ? ? ?

## 2021-09-09 ENCOUNTER — Ambulatory Visit: Payer: Managed Care, Other (non HMO) | Attending: Family Medicine | Admitting: Family Medicine

## 2021-09-09 ENCOUNTER — Encounter: Payer: Self-pay | Admitting: Family Medicine

## 2021-09-09 ENCOUNTER — Other Ambulatory Visit: Payer: Self-pay

## 2021-09-09 ENCOUNTER — Other Ambulatory Visit (HOSPITAL_COMMUNITY)
Admission: RE | Admit: 2021-09-09 | Discharge: 2021-09-09 | Disposition: A | Discharge status: Home / Self Care | Payer: Managed Care, Other (non HMO) | Source: Ambulatory Visit | Attending: Family Medicine | Admitting: Family Medicine

## 2021-09-09 ENCOUNTER — Other Ambulatory Visit: Payer: Self-pay | Admitting: Family Medicine

## 2021-09-09 VITALS — BP 156/101 | HR 107 | Ht 67.0 in | Wt 197.4 lb

## 2021-09-09 DIAGNOSIS — I10 Essential (primary) hypertension: Secondary | ICD-10-CM

## 2021-09-09 DIAGNOSIS — S7011XA Contusion of right thigh, initial encounter: Secondary | ICD-10-CM

## 2021-09-09 DIAGNOSIS — Z124 Encounter for screening for malignant neoplasm of cervix: Secondary | ICD-10-CM | POA: Insufficient documentation

## 2021-09-09 DIAGNOSIS — Z113 Encounter for screening for infections with a predominantly sexual mode of transmission: Secondary | ICD-10-CM | POA: Diagnosis not present

## 2021-09-09 DIAGNOSIS — Z0001 Encounter for general adult medical examination with abnormal findings: Secondary | ICD-10-CM

## 2021-09-09 DIAGNOSIS — R413 Other amnesia: Secondary | ICD-10-CM | POA: Diagnosis not present

## 2021-09-09 DIAGNOSIS — Z0000 Encounter for general adult medical examination without abnormal findings: Secondary | ICD-10-CM

## 2021-09-09 DIAGNOSIS — M170 Bilateral primary osteoarthritis of knee: Secondary | ICD-10-CM

## 2021-09-09 DIAGNOSIS — G8929 Other chronic pain: Secondary | ICD-10-CM

## 2021-09-09 NOTE — Progress Notes (Signed)
? ?Subjective:  ?Patient ID: Tina Moss, female    DOB: [birth year]  Age: 51 y.o. MRN: [id number] ? ?CC: Annual Exam and Gynecologic Exam ? ? ?HPI ?MYEISHA KRUSER is a 51 y.o. year old female with a history of  seizures (seizure free for the last 5 years and is not currently on antiseizure medication), multiple sclerosis, L MCA CVA in 03/2017  who presents today for an annual physical exam ? ?Interval History: ?BP is elevated and she is out of her antihypertensives even though she has refills at the pharmacy. ? ?Placed on duloxetine at her last office visit due to chronic low back pain. ?Duloxetine 'messes her up'. States it makes her walk sideways and makes her 'feel weird'. ?She states she has had some memory problems.  She forgot that she had a doctor's appointment a couple minutes after she was reminded. ?MRI from 03/2021 revealed: ?IMPRESSION: ?1. No acute intracranial abnormality. ?2. Chronic white matter disease most compatible with advanced small ?vessel ischemia, with expected evolution of the small left MCA ?territory infarct seen in 2019. ?3. Mild right frontal paranasal sinus inflammation. ?  ?She complains of noticing some discoloration on her right thigh and denies history of trauma. ?Past Medical History:  ?Diagnosis Date  ? Anemia   ? Epilepsy ([place])   ? Fibroids   ? H/O bacterial infection   ? H/O mumps   ? Hyperlipidemia   ? Hypertension   ? Seizures ([place])   ? ? ?Past Surgical History:  ?Procedure Laterality Date  ? CESAREAN SECTION    ? x2. at term  ? TEE WITHOUT CARDIOVERSION N/A 04/06/2018  ? Procedure: TRANSESOPHAGEAL ECHOCARDIOGRAM (TEE) BUBBLE STUDY;  Surgeon: Lelon Perla, MD;  Location: [facility] ENDOSCOPY;  Service: Cardiovascular;  Laterality: N/A;  ? TUBAL LIGATION    ? WISDOM TOOTH EXTRACTION    ? ? ?Family History  ?Problem Relation Age of Onset  ? Hypertension Mother   ? Hypertension Sister   ? Arthritis Sister   ?     knees  ? Hypertension Brother   ? Deafness Brother   ? Speech  disorder Brother   ?     mute  ? Mental illness Brother   ?     "he can just fly off"  ? Mental illness Daughter   ?     ? bipolar  ? Scoliosis Son   ? ? ?Social History  ? ?Socioeconomic History  ? Marital status: Divorced  ?  Spouse name: n/a  ? Number of children: 2  ? Years of education: 11th grade  ? Highest education level: 11th grade  ?Occupational History  ? Occupation: Housekeeper  ?  Comment: [employer]  ?Tobacco Use  ? Smoking status: Some Days  ?  Types: Cigars  ? Smokeless tobacco: Never  ? Tobacco comments:  ?  "I think I just keep so much on my mind."  ?Vaping Use  ? Vaping Use: Never used  ?Substance and Sexual Activity  ? Alcohol use: Yes  ?  Alcohol/week: 0.0 - 3.0 standard drinks  ?  Comment: social  ? Drug use: Not Currently  ?  Types: Marijuana  ?  Comment: occasionally  ? Sexual activity: Yes  ?  Partners: Male  ?  Birth control/protection: None  ?Other Topics Concern  ? Not on file  ?Social History Narrative  ? Lives with her daughter.  ? Son is currently incarcerated in [place].  ? She completed 11th grade, but was kicked  out and never when back, as she had become pregnant and was raising her daughter.  ? Divorced x 2.  ? ?Social Determinants of Health  ? ?Financial Resource Strain: Not on file  ?Food Insecurity: Not on file  ?Transportation Needs: Not on file  ?Physical Activity: Not on file  ?Stress: Not on file  ?Social Connections: Not on file  ? ? ?No Known Allergies ? ?Outpatient Medications Prior to Visit  ?Medication Sig Dispense Refill  ? atorvastatin (LIPITOR) 40 MG tablet Take 1 tablet (40 mg total) by mouth daily. at 6pm. NEEDS PASS 30 tablet 2  ? baclofen (LIORESAL) 10 MG tablet Take 1 tablet (10 mg total) by mouth 2 (two) times daily. 60 each 3  ? chlorthalidone (HYGROTON) 25 MG tablet Take 1 tablet (25 mg total) by mouth daily. 30 tablet 3  ? clopidogrel (PLAVIX) 75 MG tablet Take 1 tablet (75 mg total) by mouth daily. 30 tablet 2  ? famotidine (PEPCID) 20 MG tablet Take 2 tablets (40  mg total) by mouth daily. 60 tablet 2  ? ferrous sulfate 325 (65 FE) MG tablet TAKE 1 TABLET (325 MG TOTAL) BY MOUTH 2 (TWO) TIMES DAILY WITH A MEAL. 60 tablet 2  ? hydrOXYzine (ATARAX) 10 MG tablet Take 1 tablet (10 mg total) by mouth 3 (three) times daily as needed. 60 tablet 1  ? lidocaine (LIDODERM) 5 % Place 1 patch onto the skin daily. Remove & Discard patch within 12 hours or as directed by MD 30 patch 1  ? losartan (COZAAR) 25 MG tablet Take 1 tablet (25 mg total) by mouth daily 90 tablet 2  ? meloxicam (MOBIC) 7.5 MG tablet Take 1 tablet (7.5 mg total) by mouth daily. 30 tablet 3  ? predniSONE (DELTASONE) 20 MG tablet Take 1 tablet (20 mg total) by mouth daily with breakfast. 5 tablet 0  ? Vitamin D, Ergocalciferol, (DRISDOL) 1.25 MG (50000 UNIT) CAPS capsule TAKE 1 CAPSULE (50,000 UNITS TOTAL) BY MOUTH EVERY 7 (SEVEN) DAYS. 4 capsule 2  ? metoprolol tartrate (LOPRESSOR) 50 MG tablet Take 1 tablet (50 mg total) by mouth 2 (two) times daily. 60 tablet 3  ? DULoxetine (CYMBALTA) 60 MG capsule Take 1 capsule (60 mg total) by mouth daily. 30 capsule 1  ? ?No facility-administered medications prior to visit.  ? ? ? ?ROS ?Review of Systems  ?Constitutional:  Negative for activity change, appetite change and fatigue.  ?HENT:  Negative for congestion, sinus pressure and sore throat.   ?Eyes:  Negative for visual disturbance.  ?Respiratory:  Negative for cough, chest tightness, shortness of breath and wheezing.   ?Cardiovascular:  Negative for chest pain and palpitations.  ?Gastrointestinal:  Negative for abdominal distention, abdominal pain and constipation.  ?Endocrine: Negative for polydipsia.  ?Genitourinary:  Negative for dysuria and frequency.  ?Musculoskeletal:  Negative for arthralgias and back pain.  ?Skin:  Positive for color change. Negative for rash.  ?Neurological:  Negative for tremors, light-headedness and numbness.  ?Hematological:  Does not bruise/bleed easily.  ?Psychiatric/Behavioral:  Negative  for agitation and behavioral problems.   ? ?Objective:  ?BP (!) 156/101   Pulse (!) 107   Ht '5\' 7"'$  (1.702 m)   Wt 197 lb 6.4 oz (89.5 kg)   SpO2 98%   BMI 30.92 kg/m?  ? ? ?  09/09/2021  ? 10:21 AM 07/28/2021  ? 11:41 AM 07/28/2021  ? 11:21 AM  ?BP/Weight  ?Systolic BP 102 585 277  ?Diastolic BP 824 235 361  ?  Wt. (Lbs) 197.4  204  ?BMI 30.92 kg/m2  31.95 kg/m2  ? ? ? ? ?Physical Exam ?Exam conducted with a chaperone present.  ?Constitutional:   ?   General: She is not in acute distress. ?   Appearance: She is well-developed. She is not diaphoretic.  ?HENT:  ?   Head: Normocephalic.  ?   Right Ear: External ear normal.  ?   Left Ear: External ear normal.  ?   Nose: Nose normal.  ?Eyes:  ?   Conjunctiva/sclera: Conjunctivae normal.  ?   Pupils: Pupils are equal, round, and reactive to light.  ?Neck:  ?   Vascular: No JVD.  ?Cardiovascular:  ?   Rate and Rhythm: Normal rate and regular rhythm.  ?   Heart sounds: Normal heart sounds. No murmur heard. ?  No gallop.  ?Pulmonary:  ?   Effort: Pulmonary effort is normal. No respiratory distress.  ?   Breath sounds: Normal breath sounds. No wheezing or rales.  ?Chest:  ?   Chest wall: No tenderness.  ?Breasts: ?   Right: Normal. No mass, nipple discharge or tenderness.  ?   Left: Normal. No mass, nipple discharge or tenderness.  ?Abdominal:  ?   General: Bowel sounds are normal. There is no distension.  ?   Palpations: Abdomen is soft. There is no mass.  ?   Tenderness: There is no abdominal tenderness.  ?   Hernia: There is no hernia in the left inguinal area or right inguinal area.  ?Genitourinary: ?   General: Normal vulva.  ?   Pubic Area: No rash.   ?   Labia:     ?   Right: No rash.     ?   Left: No rash.   ?   Vagina: Vaginal discharge (yellowish) present.  ?   Cervix: Normal.  ?   Uterus: Normal.   ?   Adnexa: Right adnexa normal and left adnexa normal.    ?   Right: No tenderness.      ?   Left: No tenderness.    ?Musculoskeletal:     ?   General: No tenderness.  Normal range of motion.  ?   Cervical back: Normal range of motion. No tenderness.  ?Lymphadenopathy:  ?   Upper Body:  ?   Right upper body: No supraclavicular or axillary adenopathy.  ?   Left upper body: No sup

## 2021-09-09 NOTE — Patient Instructions (Signed)

## 2021-09-10 ENCOUNTER — Encounter: Payer: Self-pay | Admitting: Physician Assistant

## 2021-09-10 ENCOUNTER — Ambulatory Visit: Payer: Self-pay | Admitting: Pharmacist

## 2021-09-10 ENCOUNTER — Other Ambulatory Visit: Payer: Self-pay

## 2021-09-10 ENCOUNTER — Ambulatory Visit (INDEPENDENT_AMBULATORY_CARE_PROVIDER_SITE_OTHER): Payer: Managed Care, Other (non HMO) | Admitting: Physician Assistant

## 2021-09-10 ENCOUNTER — Other Ambulatory Visit (INDEPENDENT_AMBULATORY_CARE_PROVIDER_SITE_OTHER): Payer: Managed Care, Other (non HMO)

## 2021-09-10 VITALS — BP 159/98 | HR 110 | Resp 18 | Ht 67.0 in | Wt 199.0 lb

## 2021-09-10 DIAGNOSIS — R413 Other amnesia: Secondary | ICD-10-CM

## 2021-09-10 DIAGNOSIS — I63512 Cerebral infarction due to unspecified occlusion or stenosis of left middle cerebral artery: Secondary | ICD-10-CM | POA: Diagnosis not present

## 2021-09-10 DIAGNOSIS — Z8669 Personal history of other diseases of the nervous system and sense organs: Secondary | ICD-10-CM | POA: Insufficient documentation

## 2021-09-10 DIAGNOSIS — R4182 Altered mental status, unspecified: Secondary | ICD-10-CM

## 2021-09-10 LAB — CERVICOVAGINAL ANCILLARY ONLY
Bacterial Vaginitis (gardnerella): POSITIVE — AB
Candida Glabrata: NEGATIVE
Candida Vaginitis: NEGATIVE
Chlamydia: NEGATIVE
Comment: NEGATIVE
Comment: NEGATIVE
Comment: NEGATIVE
Comment: NEGATIVE
Comment: NEGATIVE
Comment: NORMAL
Neisseria Gonorrhea: NEGATIVE
Trichomonas: NEGATIVE

## 2021-09-10 LAB — COMPREHENSIVE METABOLIC PANEL
ALT: 22 U/L (ref 0–35)
AST: 19 U/L (ref 0–37)
Albumin: 4.3 g/dL (ref 3.5–5.2)
Alkaline Phosphatase: 123 U/L — ABNORMAL HIGH (ref 39–117)
BUN: 17 mg/dL (ref 6–23)
CO2: 28 mEq/L (ref 19–32)
Calcium: 9.6 mg/dL (ref 8.4–10.5)
Chloride: 106 mEq/L (ref 96–112)
Creatinine, Ser: 0.65 mg/dL (ref 0.40–1.20)
GFR: 101.36 mL/min (ref >60.00)
Glucose, Bld: 91 mg/dL (ref 70–99)
Potassium: 3.7 mEq/L (ref 3.5–5.1)
Sodium: 142 mEq/L (ref 135–145)
Total Bilirubin: 0.3 mg/dL (ref 0.2–1.2)
Total Protein: 7.3 g/dL (ref 6.0–8.3)

## 2021-09-10 LAB — CBC
HCT: 42.3 % (ref 36.0–46.0)
Hemoglobin: 13.8 g/dL (ref 12.0–15.0)
MCHC: 32.7 g/dL (ref 30.0–36.0)
MCV: 93.4 fl (ref 78.0–100.0)
Platelets: 220 10*3/uL (ref 150.0–400.0)
RBC: 4.53 Mil/uL (ref 3.87–5.11)
RDW: 13.6 % (ref 11.5–15.5)
WBC: 3.2 10*3/uL — ABNORMAL LOW (ref 4.0–10.5)

## 2021-09-10 LAB — HEMOGLOBIN A1C
Est. average glucose Bld gHb Est-mCnc: 126 mg/dL
Hgb A1c MFr Bld: 6 % — ABNORMAL HIGH (ref 4.8–5.6)

## 2021-09-10 LAB — SEDIMENTATION RATE: Sed Rate: 27 mm/hr (ref 0–30)

## 2021-09-10 LAB — AMMONIA: Ammonia: 44 umol/L — ABNORMAL HIGH (ref 11–35)

## 2021-09-10 LAB — C-REACTIVE PROTEIN: CRP: <1 mg/dL (ref 0.5–20.0)

## 2021-09-10 LAB — VITAMIN B12: Vitamin B-12: 1022 pg/mL (ref 232–1245)

## 2021-09-10 LAB — TSH: TSH: 1.04 u[IU]/mL (ref 0.450–4.500)

## 2021-09-10 LAB — T4, FREE: Free T4: 1.69 ng/dL (ref 0.82–1.77)

## 2021-09-10 NOTE — Progress Notes (Signed)
? ? ?Assessment/Plan:  ? ?Tina Moss is a very pleasant 51 y.o. year old RH female with  a history of hypertension, hyperlipidemia, LM cerebral artery stroke, anemia, Vit D deficiency,  tobacco use,  anxiety, depression  followed by [facility], insomnia, chronic pain due to arthritis, epilepsy,  seen today for evaluation of memory loss. MoCA today is 18/30, with several deficiencies throughout the whole test, delayed recall 2/5.  She needed an extreme amount of time to come up with the right answer, but at times was not able to do so. ? ?Memory Difficulties ? ?MRI brain with/without contrast to assess for underlying structural abnormality and assess vascular load  ?EEG  ?BEtter control of her BP by PCP ?Check B12, TSH, ESR, CRP, ANA, HIV, CBC,CMP, RPR ?Discontinue tobacco use. ?Monitor driving.  ?Discussed safety both in and out of the home.  ?Discussed the importance of regular daily schedule with inclusion of crossword puzzles to maintain brain function.  ?Continue to monitor mood with PCP.  ?Stay active at least 30 minutes at least 3 times a week.  ?Naps should be scheduled and should be no longer than 60 minutes and should not occur after 2 PM.  ?Control cardiovascular risk factors  ?Mediterranean diet is recommended  ?Folllow up once results above are available  ? ?Subjective:  ? ? ?The patient is seen in neurologic consultation at the request of Charlott Rakes, MD for the evaluation of memory.  The patient is here alone. ?This is a 51 y.o. year old RH  female who has no recalling when her memory issues began, "I will send my by Dr. Here".  She reports that she is very distracted, forgets appointments or things to do, forgets the time and she is unable to comprehend well.  She states that when she was a child, she was diagnosed with epilepsy, but she is not aware when she stopped taking medications for it.  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  Of note, during  this visit, the patient does have difficulty comprehending, expressing as well as being incoherent at times.  She works at [employer], and has noted that lately she has been burning some of the biscuits.  When she drives, sometimes she does not know where she goes with the car and has to stop, and she reports that occasionally she has a "blank ".  She leaves objects "everywhere, the other day I left my keys in the drawer, and papers near the mattress ".  She ambulates without difficulty, although in the past she had right-sided weakness without residual due to the stroke.  Her mood is depressed, denies any intermittent irritability.  She sleeps fairly well, denies vivid dreams or sleepwalking.  She does have some hallucinations seeing "floating blocks, little colors dancing ".  Sometimes, she reports being paranoid.  She denies any hygiene concerns, she is independent of bathing and dressing.  The medications are in a pillbox, but she forgets sometimes to take them.  When asked about who is in charge of the finances, incoherent answer is reported, stating "there are calls that the insurance lady takes care of ".  She cannot give a straight answer regarding finances.  Appetite is good, denies trouble swallowing.  She denies any headaches, "sometimes I have double vision" denies any dizziness, focal numbness or tingling, or unilateral weakness.  She denies tremors or anosmia.  Denies urine incontinence, sometimes she has intermittent constipation  and diarrhea.  Denies sleep apnea, or alcohol.  She smokes less than a pack a day "I used to smoke more ". ? ? MRI brain 04/03/21 No acute intracranial abnormality. 2. 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 3. Mild right frontal paranasal sinus inflammation. ? ?LAbs 09/09/21 TSH 1.040, T4 1.69, A1C 6, B12 1022  ? ?Current Outpatient  Medications  ?Medication Instructions  ? atorvastatin (LIPITOR) 40 mg, Oral, Daily, at 6pm. NEEDS PASS  ? baclofen (LIORESAL) 10 mg, Oral, 2 times daily  ? chlorthalidone (HYGROTON) 25 mg, Oral, Daily  ? clopidogrel (PLAVIX) 75 mg, Oral, Daily  ? famotidine (PEPCID) 40 mg, Oral, Daily  ? ferrous sulfate 325 (65 FE) MG tablet TAKE 1 TABLET (325 MG TOTAL) BY MOUTH 2 (TWO) TIMES DAILY WITH A MEAL.  ? hydrOXYzine (ATARAX) 10 mg, Oral, 3 times daily PRN  ? lidocaine (LIDODERM) 5 % 1 patch, Transdermal, Every 24 hours, Remove & Discard patch within 12 hours or as directed by MD  ? losartan (COZAAR) 25 MG tablet Take 1 tablet (25 mg total) by mouth daily  ? meloxicam (MOBIC) 7.5 mg, Oral, Daily  ? metoprolol tartrate (LOPRESSOR) 50 mg, Oral, 2 times daily  ? predniSONE (DELTASONE) 20 mg, Oral, Daily with breakfast  ? Vitamin D, Ergocalciferol, (DRISDOL) 1.25 MG (50000 UNIT) CAPS capsule TAKE 1 CAPSULE (50,000 UNITS TOTAL) BY MOUTH EVERY 7 (SEVEN) DAYS.  ? ? ? ?VITALS:   ?Vitals:  ? 09/10/21 0816  ?BP: (!) 159/98  ?Pulse: (!) 110  ?Resp: 18  ?SpO2: 97%  ?Weight: 199 lb (90.3 kg)  ?Height: 5' 7"  (1.702 m)  ? ? ?  09/09/2021  ? 10:23 AM 09/02/2021  ?  2:39 PM 07/28/2021  ? 11:22 AM 06/29/2021  ?  8:52 AM 06/03/2021  ? 11:45 AM  ?Depression screen PHQ 2/9  ?Decreased Interest 3 3 3 3 3   ?Down, Depressed, Hopeless 3 2 2 3 2   ?PHQ - 2 Score 6 5 5 6 5   ?Altered sleeping 3 3 3 3 3   ?Tired, decreased energy 3 3 3 3 3   ?Change in appetite 2 2 2 2 3   ?Feeling bad or failure about yourself  2 2 2 3 3   ?Trouble concentrating 2 3 2 2 3   ?Moving slowly or fidgety/restless 3 3 2  0 3  ?Suicidal thoughts 0 0 0 1 1  ?PHQ-9 Score 21 21 19 20 24   ? ? ?PHYSICAL EXAM  ? ?HEENT:  Normocephalic, atraumatic. The mucous membranes are moist. The superficial temporal arteries are without ropiness or tenderness. ?Cardiovascular: Regular rate and rhythm. ?Lungs: Clear to auscultation bilaterally. ?Neck: There are no carotid bruits noted  bilaterally. ? ?NEUROLOGICAL: ? ?  09/10/2021  ?  8:00 AM  ?Montreal Cognitive Assessment   ?Visuospatial/ Executive (0/5) 3  ?Naming (0/3) 2  ?Attention: Read list of digits (0/2) 2  ?Attention: Read list of letters (0/1) 1  ?Attention: Serial 7 subtraction starting at 100 (0/3) 0  ?Language: Repeat phrase (0/2) 0  ?Language : Fluency (0/1) 1  ?Abstraction (0/2) 0  ?Delayed Recall (0/5) 2  ?Orientation (0/6) 6  ?Total 17  ?Adjusted Score (based on education) 18  ?  ? ?  09/09/2021  ? 10:56 AM 02/16/2017  ? 12:00 PM  ?MMSE - Mini Mental State Exam  ?Not completed:  Unable to complete  ?Orientation to time 4   ?  Orientation to Place 5   ?Registration 3   ?Attention/ Calculation 5   ?Recall 2   ?Language- name 2 objects 2   ?Language- repeat 1   ?Language- follow 3 step command 3   ?Language- read & follow direction 1   ?Write a sentence 1   ?Copy design 1   ?Total score 28   ?  ? ?Orientation:  Alert and oriented to person, place and time.  Patient did show difficulty producing words, suspecting dysphasia, no dysarthria. Fund of knowledge is reduced.  Recent and remote memory impaired.  Attention and concentration are reduced.  Able to name objects 2/3 and repeat phrases 1/2. Delayed recall 2/5 ?Cranial nerves: There is good facial symmetry. Extraocular muscles are intact and visual fields are full to confrontational testing. Speech is fluent and clear. Soft palate rises symmetrically and there is no tongue deviation. Hearing is intact to conversational tone. ?Tone: Tone is good throughout. ?Sensation: Sensation is intact to light touch and pinprick throughout. Vibration is intact at the bilateral big toe.There is no extinction with double simultaneous stimulation. There is no sensory dermatomal level identified. ?Coordination: The patient has no difficulty with RAM's or FNF bilaterally. Normal finger to nose  ?Motor: Strength is 5/5 in the bilateral upper and lower extremities. There is no pronator drift. There are no  fasciculations noted. ?DTR's: Deep tendon reflexes are 2/4 at the bilateral biceps, triceps, brachioradialis, patella and achilles.  Plantar responses are downgoing bilaterally. ?Gait and Station: The patient is able to ambulate wi

## 2021-09-10 NOTE — Patient Instructions (Signed)
We have sent a referral to [facility] for your MRI and they will call you directly to schedule your appointment. They are located at [address] If you need to contact them directly please call [phone].  Your provider has requested that you have labwork completed today. Please go to [facility] ([address]) on the second floor of this building before leaving the office today. You do not need to check in. If you are not called within 15 minutes please check with the front desk.  

## 2021-09-11 ENCOUNTER — Encounter: Payer: Self-pay | Admitting: Physician Assistant

## 2021-09-11 ENCOUNTER — Other Ambulatory Visit: Payer: Self-pay

## 2021-09-11 LAB — HIV ANTIBODY (ROUTINE TESTING W REFLEX): HIV 1&2 Ab, 4th Generation: NONREACTIVE

## 2021-09-11 LAB — ANA W/REFLEX: Anti Nuclear Antibody (ANA): NEGATIVE

## 2021-09-11 LAB — HEPATITIS PANEL, ACUTE
Hep A IgM: NONREACTIVE
Hep B C IgM: NONREACTIVE
Hepatitis B Surface Ag: NONREACTIVE
Hepatitis C Ab: NONREACTIVE
SIGNAL TO CUT-OFF: <0.02 (ref <1.00)

## 2021-09-11 LAB — RPR: RPR Ser Ql: NONREACTIVE

## 2021-09-11 NOTE — Progress Notes (Signed)
Contacted Dr.Newlin regarding abnormal ammonia levels 44. Dr. Margarita Rana graciously is to follow-up.  We appreciate her involvement in this nice patient's care ? ?

## 2021-09-13 LAB — CYTOLOGY - PAP
Comment: NEGATIVE
Diagnosis: NEGATIVE
High risk HPV: NEGATIVE

## 2021-09-14 ENCOUNTER — Other Ambulatory Visit: Payer: Self-pay | Admitting: Family Medicine

## 2021-09-14 ENCOUNTER — Other Ambulatory Visit: Payer: Self-pay

## 2021-09-14 MED ORDER — METRONIDAZOLE 0.75 % VA GEL
1.0000 | Freq: Every day | VAGINAL | 0 refills | Status: DC
  Filled 2021-09-14: qty 70, 7d supply, fill #0

## 2021-09-15 ENCOUNTER — Other Ambulatory Visit: Payer: Self-pay

## 2021-09-23 ENCOUNTER — Ambulatory Visit: Payer: Managed Care, Other (non HMO) | Attending: Family Medicine | Admitting: Clinical

## 2021-09-23 DIAGNOSIS — F331 Major depressive disorder, recurrent, moderate: Secondary | ICD-10-CM | POA: Diagnosis not present

## 2021-09-24 ENCOUNTER — Other Ambulatory Visit: Payer: Managed Care, Other (non HMO)

## 2021-09-24 NOTE — BH Specialist Note (Signed)
Integrated Behavioral Health Follow Up In-Person Visit ? ?MRN: [id number] ?Name: Tina Moss ? ?Number of [facility] Clinician visits: 4- Fourth Visit ? ?Session Start time: 9935 ?  ?Session End time: 7017 ? ?Total time in minutes: 35 ? ? ?Types of Service: Individual psychotherapy ? ?Interpretor:No. Interpretor Name and Language: N/A ? ?Subjective: ?Tina Moss is a 51 y.o. female accompanied by  self ?Patient was referred by Charlott Rakes, MD for depression and anxiety. ?Patient reports the following symptoms/concerns: Reports feeling depressed. Reports that she has been stressed at her job. Reports that she has been applying for new jobs. Reports that she continues to be stressed and overwhelmed at home. Reports that she does not like living with her family due to them always needing something. Reports that she is still waiting on her disability.  ?Duration of problem: 1 year; Severity of problem: moderate ? ?Objective: ?Mood: Anxious and Depressed and Affect: Appropriate ?Risk of harm to self or others: Thoughts of violence towards others Reports thoughts of harming her supervisor whom she recently got into an argument with. 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 Denies plan/intent. ? ?Life Context: ?Family and Social: Pt stays with sister. Pt receives support from family. ?School/Work: Pt is employed part time and is searching for employment. Pt also has applied for disability.  ?Self-Care: Denies substance use. Pt currently prescribed cymbalta.  ?Life Changes: Pt has experienced significant trauma. Pt is experiencing pain and problems at work. Pt wants new employment. ? ?Patient and/or Family's Strengths/Protective Factors: ?Concrete supports in place (healthy food, safe environments, etc.) ? ?Goals Addressed: ?Patient will: ? Reduce symptoms of: anxiety and depression  ? Increase knowledge and/or ability of: coping skills  ?  Demonstrate ability to: Increase healthy adjustment to current life circumstances ? ?Progress towards Goals: ?Ongoing ? ?Interventions: ?Interventions utilized:  Optician, dispensing and CBT Cognitive Behavioral Therapy ?Standardized Assessments completed: Not Needed ? ?Patient and/or Family Response: Pt receptive to tx. Pt receptive to cognitive restructuring. Pt receptive to establishing boundaries with family. Pt will utilize crisis resources if SI/HI arises with plan, means, and intent.  ? ?Patient Centered Plan: ?Patient is on the following Treatment Plan(s): Depression and anxiety ? ?Assessment: ?Denies SI/HI. Endorses thoughts of harming her supervisor however denies plan/intent. Reports that her work schedule is being changed so that she does not work with this Librarian, academic and she is searching for a new employment. Verbal safety plan completed and pt provided with crisis resources. Patient currently experiencing depression and anxiety. Pt appears to experience problems at home. Pt has difficulty with boundaries with family.  ? ?Patient may benefit from continuing self-care and establishing boundaries. LCSW utilized Adult nurse. LCSW encouraged pt to establish healthy boundaries with family. LCSW discussed with pt that she is leaving. LCSW provided pt with resource to [facility] and encouraged pt to attend during walk-in hours.  ? ?Plan: ?Follow up with behavioral health clinician on : Attend [facility] during walkin hours. ?Behavioral recommendations: Establish healthy boundaries ?Referral(s): Armed forces logistics/support/administrative officer (LME/Outside Clinic) ?"From scale of 1-10, how likely are you to follow plan?": 10 ? ?Molly Savarino C Jelena Malicoat, LCSW ? ?[facility]  ?[address] [place], [place] [address] [phone] or [phone] ?WALK-IN URGENT CARE 24/7 FOR ANYONE ?[address], [place], [place] ?Fax: [phone] [email or web address] ?*Interpreters  available ?*Accepts all insurance and uninsured for Urgent Care needs ?*Accepts Medicaid and uninsured for outpatient  treatment  ?  ? ?ONLY FOR [facility] ? ?New patient assessment and therapy walk-ins ?Mondays and Wednesdays 8am-11am ?First and second Fridays 1pm-5pm ? ?New patient psychiatry and medication management walk-ins:  ?Mondays, Wednesdays, Thursdays, Fridays 8am -11am ?NO PSYCHIATRY WALK-INS on TUESDAYS ?  ?

## 2021-09-24 NOTE — Patient Instructions (Signed)
[facility]  ?[address], [place], [place] [address] [phone] or [phone] ?WALK-IN URGENT CARE 24/7 FOR ANYONE ?[address], [place],   [phone] ?Fax: [phone] [email or web address] ?*Interpreters available ?*Accepts all insurance and uninsured for Urgent Care needs ?*Accepts Medicaid and uninsured for outpatient treatment  ?  ? ?ONLY FOR [place] Residents ? ?New patient assessment and therapy walk-ins ?Mondays and Wednesdays 8am-11am ?First and second Fridays 1pm-5pm ? ?New patient psychiatry and medication management walk-ins:  ?Mondays, Wednesdays, Thursdays, Fridays 8am -11am ?NO PSYCHIATRY WALK-INS on TUESDAYS ?  ?

## 2021-09-29 ENCOUNTER — Ambulatory Visit: Payer: Managed Care, Other (non HMO) | Admitting: Pharmacist

## 2021-10-05 ENCOUNTER — Ambulatory Visit (INDEPENDENT_AMBULATORY_CARE_PROVIDER_SITE_OTHER): Payer: Managed Care, Other (non HMO) | Admitting: Neurology

## 2021-10-05 DIAGNOSIS — R413 Other amnesia: Secondary | ICD-10-CM | POA: Diagnosis not present

## 2021-10-05 NOTE — Procedures (Signed)
ELECTROENCEPHALOGRAM REPORT ? ?Date of Study: 10/05/2021 ? ?Patient's Name: Tina Moss ?MRN: [id number] ?Date of Birth: [birth year] ? ?Referring Provider: Sharene Butters, PA-C ? ?Clinical History: This is a 51 year old woman with history of seizures in childhood with memory loss, hallucinations. EEG for classification. ? ?Medications: ?LIPITOR  40 mg ?LIORESAL  10 mg ?HYGROTON  25 mg ?PLAVIX  75 mg ?PEPCID  40 mg ?65 FE MG tablet ?ATARAX  10 mg ?LIDODERM 5 %  1 patch ?COZAAR 25 MG tablet ?MOBIC  7.5 mg ?DELTASONE  20 mg ?LOPRESSOR  50 mg ?DRISDOL 1.25 MG (50000 UNIT) CAPS capsule ? ?Technical Summary: ?A multichannel digital 1-hour EEG recording measured by the international 10-20 system with electrodes applied with paste and impedances below 5000 ohms performed in our laboratory with EKG monitoring in an awake and asleep patient.  Hyperventilation and photic stimulation were performed.  The digital EEG was referentially recorded, reformatted, and digitally filtered in a variety of bipolar and referential montages for optimal display.   ? ?Description: ?The patient is awake and asleep during the recording.  During maximal wakefulness, there is a symmetric, medium voltage 9 Hz posterior dominant rhythm that attenuates with eye opening.  The record is symmetric.  During drowsiness and sleep, there is an increase in theta slowing of the background.  Vertex waves and symmetric sleep spindles were seen.  Photic stimulation did not elicit any abnormalities.  There were no epileptiform discharges or electrographic seizures seen.   ? ?EKG lead was unremarkable. ? ?Impression: ?This 1-hour awake and asleep EEG is normal.   ? ?Clinical Correlation: ?A normal EEG does not exclude a clinical diagnosis of epilepsy.  If further clinical questions remain, prolonged EEG may be helpful.  Clinical correlation is advised. ? ? ?Ellouise Newer, M.D. ? ?

## 2021-10-05 NOTE — Progress Notes (Signed)
Please inform patient that her EEG is normal, no seizures noted thanks

## 2021-10-06 ENCOUNTER — Encounter: Payer: Self-pay | Admitting: Physician Assistant

## 2021-10-06 ENCOUNTER — Other Ambulatory Visit: Payer: Self-pay

## 2021-10-06 ENCOUNTER — Ambulatory Visit (INDEPENDENT_AMBULATORY_CARE_PROVIDER_SITE_OTHER): Payer: Managed Care, Other (non HMO) | Admitting: Physician Assistant

## 2021-10-06 ENCOUNTER — Ambulatory Visit
Admission: RE | Admit: 2021-10-06 | Discharge: 2021-10-06 | Disposition: A | Discharge status: Home / Self Care | Payer: Managed Care, Other (non HMO) | Source: Ambulatory Visit | Attending: Physician Assistant | Admitting: Physician Assistant

## 2021-10-06 VITALS — BP 118/85 | HR 101 | Temp 98.7°F | Resp 18 | Ht 68.0 in | Wt 190.0 lb

## 2021-10-06 DIAGNOSIS — R7303 Prediabetes: Secondary | ICD-10-CM | POA: Diagnosis not present

## 2021-10-06 DIAGNOSIS — L309 Dermatitis, unspecified: Secondary | ICD-10-CM | POA: Diagnosis not present

## 2021-10-06 MED ORDER — GADOBENATE DIMEGLUMINE 529 MG/ML IV SOLN
19.0000 mL | Freq: Once | INTRAVENOUS | Status: AC | PRN
  Administered 2021-10-06: 19 mL via INTRAVENOUS

## 2021-10-06 MED ORDER — NYSTATIN-TRIAMCINOLONE 100000-0.1 UNIT/GM-% EX OINT
1.0000 "application " | TOPICAL_OINTMENT | Freq: Two times a day (BID) | CUTANEOUS | 0 refills | Status: DC
  Filled 2021-10-06: qty 30, 15d supply, fill #0

## 2021-10-06 NOTE — Progress Notes (Signed)
Patient has not eaten or taken medication today. ?Patient denies pain at this time. ?Patient reports bilateral arm/hand rash presenting on Monday while in the hospital with her brother. ?Patient denies itching/burning. Areas feel like small bruises. ?Patient reports picking up a large tray at work and feeling an abdominal pull, patient reports bleeding for 8 days after. ? ?

## 2021-10-06 NOTE — Patient Instructions (Addendum)
Unable to determine the reason for your rash, however also unable to rule out hand-foot and mouth.  I do encourage you to do the trial of the cream I sent to your pharmacy.  I also encourage you to stay very mindful of possibility of hand-foot-and-mouth and I did enclose information regarding that. ? ?I do encourage you to work on reducing added sugars in your diet as well. ? ?Kennieth Rad, PA-C ?Physician Assistant ?[facility] ?[email or web address] ? ? ?Contact Dermatitis ?Dermatitis is redness, soreness, and swelling (inflammation) of the skin. Contact dermatitis is a reaction to certain substances that touch the skin. Many different substances can cause contact dermatitis. There are two types of contact dermatitis: ?Irritant contact dermatitis. This type is caused by something that irritates your skin, such as having dry hands from washing them too often with soap. This type does not require previous exposure to the substance for a reaction to occur. This is the most common type. ?Allergic contact dermatitis. This type is caused by a substance that you are allergic to, such as poison ivy. This type occurs when you have been exposed to the substance (allergen) and develop a sensitivity to it. Dermatitis may develop soon after your first exposure to the allergen, or it may not develop until the next time you are exposed and every time thereafter. ?What are the causes? ?Irritant contact dermatitis is most commonly caused by exposure to: ?Makeup. ?Soaps. ?Detergents. ?Bleaches. ?Acids. ?Metal salts, such as nickel. ?Allergic contact dermatitis is most commonly caused by exposure to: ?Poisonous plants. ?Chemicals. ?Jewelry. ?Latex. ?Medicines. ?Preservatives in products, such as clothing. ?What increases the risk? ?You are more likely to develop this condition if you have: ?A job that exposes you to irritants or allergens. ?Certain medical conditions, such as  asthma or eczema. ?What are the signs or symptoms? ?Symptoms of this condition may occur on your body anywhere the irritant has touched you or is touched by you. ?Symptoms include: ?Dryness or flaking. ?Redness. ?Cracks. ?Itching. ?Pain or a burning feeling. ?Blisters. ?Drainage of small amounts of blood or clear fluid from skin cracks. ?With allergic contact dermatitis, there may also be swelling in areas such as the eyelids, mouth, or genitals. ?How is this diagnosed? ?This condition is diagnosed with a medical history and physical exam. ?A patch skin test may be performed to help determine the cause. ?If the condition is related to your job, you may need to see an occupational medicine specialist. ?How is this treated? ?This condition is treated by checking for the cause of the reaction and protecting your skin from further contact. Treatment may also include: ?Steroid creams or ointments. Oral steroid medicines may be needed in more severe cases. ?Antibiotic medicines or antibacterial ointments, if a skin infection is present. ?Antihistamine lotion or an antihistamine taken by mouth to ease itching. ?A bandage (dressing). ?Follow these instructions at home: ?Skin care ?Moisturize your skin as needed. ?Apply cool compresses to the affected areas. ?Try applying baking soda paste to your skin. Stir water into baking soda until it reaches a paste-like consistency. ?Do not scratch your skin, and avoid friction to the affected area. ?Avoid the use of soaps, perfumes, and dyes. ?Medicines ?Take or apply over-the-counter and prescription medicines only as told by your health care provider. ?If you were prescribed an antibiotic medicine, take or apply the antibiotic as told by your health care provider. Do not stop using the antibiotic even if your condition improves. ?Bathing ?Try taking a bath  with: ?Epsom salts. Follow the instructions on the packaging. You can get these at your local pharmacy or grocery  store. ?Baking soda. Pour a small amount into the bath as directed by your health care provider. ?Colloidal oatmeal. Follow the instructions on the packaging. You can get this at your local pharmacy or grocery store. ?Bathe less frequently, such as every other day. ?Bathe in lukewarm water. Avoid using hot water. ?Bandage care ?If you were given a bandage (dressing), change it as told by your health care provider. ?Wash your hands with soap and water before and after you change your dressing. If soap and water are not available, use hand sanitizer. ?General instructions ?Avoid the substance that caused your reaction. If you do not know what caused it, keep a journal to try to track what caused it. Write down: ?What you eat. ?What cosmetic products you use. ?What you drink. ?What you wear in the affected area. This includes jewelry. ?Check the affected areas every day for signs of infection. Check for: ?More redness, swelling, or pain. ?More fluid or blood. ?Warmth. ?Pus or a bad smell. ?Keep all follow-up visits as told by your health care provider. This is important. ?Contact a health care provider if: ?Your condition does not improve with treatment. ?Your condition gets worse. ?You have signs of infection such as swelling, tenderness, redness, soreness, or warmth in the affected area. ?You have a fever. ?You have new symptoms. ?Get help right away if: ?You have a severe headache, neck pain, or neck stiffness. ?You vomit. ?You feel very sleepy. ?You notice red streaks coming from the affected area. ?Your bone or joint underneath the affected area becomes painful after the skin has healed. ?The affected area turns darker. ?You have difficulty breathing. ?Summary ?Dermatitis is redness, soreness, and swelling (inflammation) of the skin. Contact dermatitis is a reaction to certain substances that touch the skin. ?Symptoms of this condition may occur on your body anywhere the irritant has touched you or is touched by  you. ?This condition is treated by figuring out what caused the reaction and protecting your skin from further contact. Treatment may also include medicines and skin care. ?Avoid the substance that caused your reaction. If you do not know what caused it, keep a journal to try to track what caused it. ?Contact a health care provider if your condition gets worse or you have signs of infection such as swelling, tenderness, redness, soreness, or warmth in the affected area. ?This information is not intended to replace advice given to you by your health care provider. Make sure you discuss any questions you have with your health care provider. ?Document Revised: 03/16/2021 Document Reviewed: 03/16/2021 ?Elsevier Patient Education ? [place]. ? ?Hand, Foot, and Mouth Disease, Adult ?Hand, foot, and mouth disease is a common viral illness. It happens mainly in children who are younger than 5 years, but adolescents and adults can also get it. The illness can spread easily from person to person (is contagious) and often causes: ?Sores in the mouth. ?A rash on the hands and feet. ?Usually, this condition is not serious. Most people get better within 1-2 weeks. ?What are the causes? ?This illness is usually caused by a group of viruses called enteroviruses. A person is most contagious during the first week of the illness. The infection spreads through direct contact with: ?Discharge from the nose or throat of an infected person. ?Stool (feces) of an infected person. ?Surfaces that have been contaminated. ?What are the signs  or symptoms? ?Symptoms of this condition include: ?Small sores in the mouth. These may cause pain. ?A rash on the hands and feet and sometimes on the buttocks. The rash may also occur on the arms, legs, or other areas of the body. The rash may look like small red bumps or sores and may have blisters. ?Fever. ?Sore throat. ?Body aches or headaches. ?Decreased appetite. ?How is this diagnosed? ?This  condition is usually diagnosed based on: ?A physical exam. Your health care provider will look at your rash and mouth sores. ?In some cases, a stool sample or a throat swab may be taken to check for the virus or for

## 2021-10-06 NOTE — Progress Notes (Signed)
? ?Established Patient Office Visit ? ?Subjective   ?Patient ID: Tina Moss, female    DOB: 1970/06/29  Age: 51 y.o. MRN: [id number] ? ?Chief Complaint  ?Patient presents with  ? Rash  ?  Bilateral arms/hands  ? ? ?States that she started noticing some small red bumps on bilateral forearms, palms of both hands, abdomen, and soles of both feet.  Denies blisterlike appearance, denies itching, burning.  States that they are tender to touch. ? ?Denies any new detergents, lotions, body washes, medications, fragrances, no one else at home with similar rash.  Does endorse that she had been staying at the hospital with her brother when they started.  Has not tried anything for relief.  ? ?Is concerned that it may be related to her recent diagnosis of diabetes. ? ? ?Past Medical History:  ?Diagnosis Date  ? Anemia   ? Epilepsy ([place])   ? Fibroids   ? H/O bacterial infection   ? H/O mumps   ? Hyperlipidemia   ? Hypertension   ? Seizures ([place])   ? ?Social History  ? ?Socioeconomic History  ? Marital status: Divorced  ?  Spouse name: n/a  ? Number of children: 2  ? Years of education: 11th grade  ? Highest education level: 11th grade  ?Occupational History  ? Occupation: Housekeeper  ?  Comment: [employer]  ?Tobacco Use  ? Smoking status: Some Days  ?  Types: Cigars  ? Smokeless tobacco: Never  ? Tobacco comments:  ?  "I think I just keep so much on my mind."  ?Vaping Use  ? Vaping Use: Never used  ?Substance and Sexual Activity  ? Alcohol use: Yes  ?  Alcohol/week: 0.0 - 3.0 standard drinks  ?  Comment: social  ? Drug use: Not Currently  ?  Types: Marijuana  ?  Comment: occasionally  ? Sexual activity: Yes  ?  Partners: Male  ?  Birth control/protection: None  ?Other Topics Concern  ? Not on file  ?Social History Narrative  ? Lives with her daughter.  ? Son is currently incarcerated in [place].  ? She completed 11th grade, but was kicked out and never when back, as she had become pregnant and was raising her daughter.  ? Divorced x  2.  ? Right handed  ? Drinks caffeine  ? One story home  ? ?Social Determinants of Health  ? ?Financial Resource Strain: Not on file  ?Food Insecurity: Not on file  ?Transportation Needs: Not on file  ?Physical Activity: Not on file  ?Stress: Not on file  ?Social Connections: Not on file  ?Intimate Partner Violence: Not on file  ? ?Family History  ?Problem Relation Age of Onset  ? Hypertension Mother   ? Hypertension Sister   ? Arthritis Sister   ?     knees  ? Hypertension Brother   ? Deafness Brother   ? Speech disorder Brother   ?     mute  ? Mental illness Brother   ?     "he can just fly off"  ? Mental illness Daughter   ?     ? bipolar  ? Scoliosis Son   ? ?No Known Allergies ?  ? ?Review of Systems  ?Constitutional: Negative.   ?HENT: Negative.    ?Eyes: Negative.   ?Respiratory:  Negative for cough.   ?Cardiovascular:  Negative for chest pain.  ?Gastrointestinal: Negative.   ?Genitourinary: Negative.   ?Musculoskeletal: Negative.   ?Skin:  Positive  for rash. Negative for itching.  ?Neurological: Negative.   ?Endo/Heme/Allergies: Negative.   ?Psychiatric/Behavioral: Negative.    ? ?  ?Objective:  ?  ? ?BP 118/85 (BP Location: Right Arm, Patient Position: Sitting, Cuff Size: Normal)   Pulse (!) 101   Temp 98.7 ?F (37.1 ?C) (Oral)   Resp 18   Ht '5\' 8"'$  (1.727 m)   Wt 190 lb (86.2 kg)   LMP 09/27/2021 Comment: Patient reports a pulling and bleed from 4/16-4/24  SpO2 98%   BMI 28.89 kg/m?  ?  ? ?Physical Exam ?Vitals and nursing note reviewed.  ?Constitutional:   ?   Appearance: Normal appearance.  ?HENT:  ?   Head: Normocephalic and atraumatic.  ?   Right Ear: External ear normal.  ?   Left Ear: External ear normal.  ?   Nose: Nose normal.  ?   Mouth/Throat:  ?   Mouth: Mucous membranes are moist.  ?   Pharynx: Oropharynx is clear.  ?Eyes:  ?   Extraocular Movements: Extraocular movements intact.  ?   Conjunctiva/sclera: Conjunctivae normal.  ?   Pupils: Pupils are equal, round, and reactive to light.   ?Cardiovascular:  ?   Rate and Rhythm: Normal rate and regular rhythm.  ?   Pulses: Normal pulses.  ?   Heart sounds: Normal heart sounds.  ?Pulmonary:  ?   Effort: Pulmonary effort is normal.  ?   Breath sounds: Normal breath sounds.  ?Musculoskeletal:  ?   Cervical back: Normal range of motion and neck supple.  ?Skin: ?   General: Skin is warm and dry.  ?   Findings: Rash is not crusting or vesicular.  ?   Comments: Small red nonblister like scattered spots on bilateral forearms, Palmar surface of hands, right side of abdomen and heels of both feet  ?Neurological:  ?   General: No focal deficit present.  ?   Mental Status: She is alert and oriented to person, place, and time.  ?Psychiatric:     ?   Mood and Affect: Mood normal.     ?   Behavior: Behavior normal.     ?   Thought Content: Thought content normal.     ?   Judgment: Judgment normal.  ? ? ? ?No results found for any visits on 10/06/21. ? ?Last hemoglobin A1c ?Lab Results  ?Component Value Date  ? HGBA1C 6.0 (H) 09/09/2021  ? ?   ?Assessment & Plan:  ? ?Problem List Items Addressed This Visit   ?None ?Visit Diagnoses   ? ? Dermatitis    -  Primary  ? Relevant Medications  ? nystatin-triamcinolone ointment (MYCOLOG)  ? Prediabetes      ? ?  ?1. Dermatitis ?Rash is not blisterlike in appearance, however due to location of rash and recent time spent in hospital, unable to rule out hand-foot-and-mouth.  Patient education given on supportive care for both dermatitis and hand-foot-and-mouth.  Trial Mycolog.  Red flags given for prompt reevaluation ?- nystatin-triamcinolone ointment (MYCOLOG); Apply topically 2 (two) times daily.  Dispense: 30 g; Refill: 0 ? ?2. Prediabetes ?Patient education given on prediabetes versus diabetes.  Patient encouraged to follow low sugar diet.  Continue follow-up with primary care provider ? ? ? ?I have reviewed the patient's medical history (PMH, PSH, Social History, Family History, Medications, and allergies) , and have been  updated if relevant. I spent 30 minutes reviewing chart and  face to face time with patient. ? ? ? ?  Return if symptoms worsen or fail to improve.  ? ? ?Montserrat Shek S Mayers, PA-C ? ?

## 2021-10-08 ENCOUNTER — Other Ambulatory Visit: Payer: Managed Care, Other (non HMO)

## 2021-10-15 ENCOUNTER — Other Ambulatory Visit: Payer: Self-pay

## 2021-10-15 ENCOUNTER — Ambulatory Visit: Payer: Self-pay | Admitting: *Deleted

## 2021-10-15 ENCOUNTER — Ambulatory Visit: Payer: Managed Care, Other (non HMO) | Admitting: Physician Assistant

## 2021-10-15 ENCOUNTER — Encounter: Payer: Self-pay | Admitting: Family Medicine

## 2021-10-15 ENCOUNTER — Ambulatory Visit: Payer: Commercial Managed Care - HMO | Attending: Family Medicine | Admitting: Family Medicine

## 2021-10-15 VITALS — BP 126/85 | HR 102 | Ht 68.0 in | Wt 199.0 lb

## 2021-10-15 DIAGNOSIS — B352 Tinea manuum: Secondary | ICD-10-CM

## 2021-10-15 DIAGNOSIS — B354 Tinea corporis: Secondary | ICD-10-CM | POA: Diagnosis not present

## 2021-10-15 MED ORDER — CLOTRIMAZOLE 1 % EX CREA
1.0000 "application " | TOPICAL_CREAM | Freq: Two times a day (BID) | CUTANEOUS | 1 refills | Status: DC
  Filled 2021-10-15: qty 60, 30d supply, fill #0

## 2021-10-15 NOTE — Progress Notes (Incomplete)
? ?Assessment/Plan:  ? 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 ? ? 51 y.o. year old RH female with  a history of hypertension, hyperlipidemia, LM cerebral artery stroke, anemia, Vit D deficiency,  tobacco use,  anxiety, depression  followed by [facility], insomnia, chronic pain due to arthritis, epilepsy,  seen today for evaluation of memory loss.  Last MoCA was 18/30, with delayed recall 2/5.  MRI of the brain was without significant changes since prior in 04/03/2021.  There are multiple remote infarcts in the left frontal lobe cortex, with bilateral centrum semiovale on the background of advanced chronic white matter microangiopathy, but no new findings were noted.  EEG was normal.  Labs were unremarkable with the exception of mild ammonia in the setting of behavioral medicines.  Parenchymal volume is within normal limits. ? ? Recommendations:  ? ?Discussed safety both in and out of the home.  ?Discussed the importance of regular daily schedule with inclusion of crossword puzzles to maintain brain function.  ?Continue to monitor mood by PCP ?Stay active at least 30 minutes at least 3 times a week.  ?Naps should be scheduled and should be no longer than 60 minutes and should not occur after 2 PM.  ?Mediterranean diet is recommended  ?Control cardiovascular risk factors  ?Start donepezil 10 mg, take half tablet (5 mg) for 2 weeks, then increase to 10 mg daily if tolerated. ?Start follow up in 6 months. ? ? ?Case discussed with Dr. Delice Lesch who agrees with the plan ? ? ? ? ?Subjective:  ? ? ?Tina Moss is a very pleasant 51 y.o. RH female  seen today in follow up for memory loss. This patient is accompanied in the office by her who supplements the history.  Previous records as well as any outside records available were reviewed prior to todays visit.  Patient was last seen at our office on  at which time her  ?Patient is currently on  ? ? ?Any changes in memory since last visit?  Denies.  She continues  to forget appointments or things to do, and has difficulties comprehending instructions. ?Patient lives with: Family  ?repeats oneself?  ?Disoriented when walking into a room?  Patient denies   ?Leaving objects in unusual places?  She continues to use objects in the house and outside of the house. ?Ambulates  with difficulty?   Patient denies   ?Recent falls?  Patient denies   ?Any head injuries?  Patient denies   ?History of seizures?   Patient denies   ?Wandering behavior?  Patient denies   ?Patient drives?   Patient continues to drive, and feels disoriented at times. ?Any mood changes such irritability agitation?  Patient denies   ?Any history of depression?:  Endorsed.  This is under the care of her behavioral therapist, she appears to be overwhelmed at home, because everybody needs something. ?Hallucinations?  Endorsed "callers dancing in floating blocks ". ?Paranoia?  Endorsed. ?Patient reports that he sleeps well without vivid dreams, REM behavior or sleepwalking   Patient reports vivid dreams   ?History of sleep apnea?  Patient denies   ?Any hygiene concerns?  Patient denies   ?Independent of bathing and dressing?  Endorsed  ?Does the patient needs help with medications?  She places the medications in a pillbox, but sometimes forgets to take them. ?Who is in charge of the finances?  Patient is in charge but is unclear, is tangential, if she is the one pain of the bills or if she  has any assistance.  She cannot provide a straight answer.   ?Any changes in appetite?  Patient denies   ?Patient have trouble swallowing? Patient denies   ?Does the patient cook?  Patient denies   ?Any kitchen accidents such as leaving the stove on? Patient denies   ?Any headaches?  Patient denies   ?The double vision?  "Sometimes I have double vision", denies any dizziness, focal numbness or tingling or unilateral weakness suggestive of a stroke. ?Any focal numbness or tingling?  Patient denies   ?Chronic back pain Patient denies    ?Unilateral weakness?  Patient denies   ?Any tremors?  Patient denies   ?Any history of anosmia?  Patient denies   ?Any incontinence of urine?  Patient denies   ?Any bowel dysfunction?  Occasional constipation and diarrhea. ?Continues to smoke less than a pack a day. ?  ? Initial Visit 09/10/21 The patient is seen in neurologic consultation at the request of Tina Rakes, MD for the evaluation of memory.  The patient is here alone. ?This is a 51 y.o. year old RH  female who has no recalling when her memory issues began, "I will send my by Dr. Here".  She reports that she is very distracted, forgets appointments or things to do, forgets the time and she is unable to comprehend well.  She states that when she was a child, she was diagnosed with epilepsy, but she is not aware when she stopped taking medications for it.  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  Of note, during this visit, the patient does have difficulty comprehending, expressing as well as being incoherent at times.  She works at [employer], and has noted that lately she has been burning some of the biscuits.  When she drives, sometimes she does not know where she goes with the car and has to stop, and she reports that occasionally she has a "blank ".  She leaves objects "everywhere, the other day I left my keys in the drawer, and papers near the mattress ".  She ambulates without difficulty, although in the past she had right-sided weakness without residual due to the stroke.  Her mood is depressed, denies any intermittent irritability.  She sleeps fairly well, denies vivid dreams or sleepwalking.  She does have some hallucinations seeing "floating blocks, little colors dancing ".  Sometimes, she reports being paranoid.  She denies any hygiene concerns, she is independent of bathing and dressing.  The medications are in a pillbox, but she forgets sometimes to take them.  When asked  about who is in charge of the finances, incoherent answer is reported, stating "there are calls that the insurance lady takes care of ".  She cannot give a straight answer regarding finances.  Appetite is good, denies trouble swallowing.  She denies any headaches, "sometimes I have double vision" denies any dizziness, focal numbness or tingling, or unilateral weakness.  She denies tremors or anosmia.  Denies urine incontinence, sometimes she has intermittent constipation and diarrhea.  Denies sleep apnea, or alcohol.  She smokes less than a pack a day "I used to smoke more ". ? ? ?Past Medical History:  ?Diagnosis Date  ? Anemia   ? Epilepsy ([place])   ? Fibroids   ? H/O bacterial infection   ? H/O mumps   ? Hyperlipidemia   ? Hypertension   ? Seizures ([place])   ?  ? ?Past Surgical History:  ?  Procedure Laterality Date  ? CESAREAN SECTION    ? x2. at term  ? TEE WITHOUT CARDIOVERSION N/A 04/06/2018  ? Procedure: TRANSESOPHAGEAL ECHOCARDIOGRAM (TEE) BUBBLE STUDY;  Surgeon: Lelon Perla, MD;  Location: [facility] ENDOSCOPY;  Service: Cardiovascular;  Laterality: N/A;  ? TUBAL LIGATION    ? WISDOM TOOTH EXTRACTION    ?  ? ?PREVIOUS MEDICATIONS:  ? ?CURRENT MEDICATIONS:  ?Outpatient Encounter Medications as of 10/15/2021  ?Medication Sig  ? atorvastatin (LIPITOR) 40 MG tablet Take 1 tablet (40 mg total) by mouth daily. at 6pm. NEEDS PASS  ? baclofen (LIORESAL) 10 MG tablet Take 1 tablet (10 mg total) by mouth 2 (two) times daily.  ? chlorthalidone (HYGROTON) 25 MG tablet Take 1 tablet (25 mg total) by mouth daily.  ? clopidogrel (PLAVIX) 75 MG tablet Take 1 tablet (75 mg total) by mouth daily.  ? famotidine (PEPCID) 20 MG tablet Take 2 tablets (40 mg total) by mouth daily. (Patient not taking: Reported on 09/10/2021)  ? ferrous sulfate 325 (65 FE) MG tablet TAKE 1 TABLET (325 MG TOTAL) BY MOUTH 2 (TWO) TIMES DAILY WITH A MEAL.  ? hydrOXYzine (ATARAX) 10 MG tablet Take 1 tablet (10 mg total) by mouth 3 (three) times daily as needed.   ? lidocaine (LIDODERM) 5 % Place 1 patch onto the skin daily. Remove & Discard patch within 12 hours or as directed by MD  ? losartan (COZAAR) 25 MG tablet Take 1 tablet (25 mg total) by mouth daily

## 2021-10-15 NOTE — Progress Notes (Addendum)
? ?Subjective:  ?Patient ID: Tina Moss, female    DOB: Nov 28, 1970  Age: 51 y.o. MRN: [id number] ? ?CC: Rash ? ? ?HPI ?Tina Moss is a 51 y.o. year old female with a history of seizures (seizure free for the last 5 years and is not currently on antiseizure medication), multiple sclerosis, L MCA CVA in 03/2017 . ? ?Interval History: ? ?She developed spots on her hands 2 weeks ago when she stayed over at the hospital with her brother.Lesions started out on her palms and are now spreading to her upper extremities and lower abdomen but not pruritic. ?She denies presence of fever, no known allergies. ?Seen by the PA last week and placed on nystatin/triamcinolone which has not been effective. ? ?Past Medical History:  ?Diagnosis Date  ? Anemia   ? Epilepsy ([place])   ? Fibroids   ? H/O bacterial infection   ? H/O mumps   ? Hyperlipidemia   ? Hypertension   ? Seizures ([place])   ? ? ?Past Surgical History:  ?Procedure Laterality Date  ? CESAREAN SECTION    ? x2. at term  ? TEE WITHOUT CARDIOVERSION N/A 04/06/2018  ? Procedure: TRANSESOPHAGEAL ECHOCARDIOGRAM (TEE) BUBBLE STUDY;  Surgeon: Lelon Perla, MD;  Location: [facility] ENDOSCOPY;  Service: Cardiovascular;  Laterality: N/A;  ? TUBAL LIGATION    ? WISDOM TOOTH EXTRACTION    ? ? ?Family History  ?Problem Relation Age of Onset  ? Hypertension Mother   ? Hypertension Sister   ? Arthritis Sister   ?     knees  ? Hypertension Brother   ? Deafness Brother   ? Speech disorder Brother   ?     mute  ? Mental illness Brother   ?     "he can just fly off"  ? Mental illness Daughter   ?     ? bipolar  ? Scoliosis Son   ? ? ?Social History  ? ?Socioeconomic History  ? Marital status: Divorced  ?  Spouse name: n/a  ? Number of children: 2  ? Years of education: 11th grade  ? Highest education level: 11th grade  ?Occupational History  ? Occupation: Housekeeper  ?  Comment: [employer]  ?Tobacco Use  ? Smoking status: Some Days  ?  Types: Cigars  ? Smokeless tobacco: Never  ? Tobacco  comments:  ?  "I think I just keep so much on my mind."  ?Vaping Use  ? Vaping Use: Never used  ?Substance and Sexual Activity  ? Alcohol use: Yes  ?  Alcohol/week: 0.0 - 3.0 standard drinks  ?  Comment: social  ? Drug use: Not Currently  ?  Types: Marijuana  ?  Comment: occasionally  ? Sexual activity: Yes  ?  Partners: Male  ?  Birth control/protection: None  ?Other Topics Concern  ? Not on file  ?Social History Narrative  ? Lives with her daughter.  ? Son is currently incarcerated in [place].  ? She completed 11th grade, but was kicked out and never when back, as she had become pregnant and was raising her daughter.  ? Divorced x 2.  ? Right handed  ? Drinks caffeine  ? One story home  ? ?Social Determinants of Health  ? ?Financial Resource Strain: Not on file  ?Food Insecurity: Not on file  ?Transportation Needs: Not on file  ?Physical Activity: Not on file  ?Stress: Not on file  ?Social Connections: Not on file  ? ? ?No Known  Allergies ? ?Outpatient Medications Prior to Visit  ?Medication Sig Dispense Refill  ? baclofen (LIORESAL) 10 MG tablet Take 1 tablet (10 mg total) by mouth 2 (two) times daily. 60 each 3  ? chlorthalidone (HYGROTON) 25 MG tablet Take 1 tablet (25 mg total) by mouth daily. 30 tablet 3  ? clopidogrel (PLAVIX) 75 MG tablet Take 1 tablet (75 mg total) by mouth daily. 30 tablet 2  ? hydrOXYzine (ATARAX) 10 MG tablet Take 1 tablet (10 mg total) by mouth 3 (three) times daily as needed. 60 tablet 1  ? lidocaine (LIDODERM) 5 % Place 1 patch onto the skin daily. Remove & Discard patch within 12 hours or as directed by MD 30 patch 1  ? losartan (COZAAR) 25 MG tablet Take 1 tablet (25 mg total) by mouth daily 90 tablet 2  ? meloxicam (MOBIC) 7.5 MG tablet Take 1 tablet (7.5 mg total) by mouth daily. 30 tablet 3  ? nystatin-triamcinolone ointment (MYCOLOG) Apply topically 2 (two) times daily. 30 g 0  ? atorvastatin (LIPITOR) 40 MG tablet Take 1 tablet (40 mg total) by mouth daily. at 6pm. NEEDS PASS 30  tablet 2  ? famotidine (PEPCID) 20 MG tablet Take 2 tablets (40 mg total) by mouth daily. (Patient not taking: Reported on 09/10/2021) 60 tablet 2  ? ferrous sulfate 325 (65 FE) MG tablet TAKE 1 TABLET (325 MG TOTAL) BY MOUTH 2 (TWO) TIMES DAILY WITH A MEAL. 60 tablet 2  ? metoprolol tartrate (LOPRESSOR) 50 MG tablet Take 1 tablet (50 mg total) by mouth 2 (two) times daily. 60 tablet 3  ? ?No facility-administered medications prior to visit.  ? ? ? ?ROS ?Review of Systems  ?Constitutional:  Negative for activity change, appetite change and fatigue.  ?HENT:  Negative for congestion, sinus pressure and sore throat.   ?Eyes:  Negative for visual disturbance.  ?Respiratory:  Negative for cough, chest tightness, shortness of breath and wheezing.   ?Cardiovascular:  Negative for chest pain and palpitations.  ?Gastrointestinal:  Negative for abdominal distention, abdominal pain and constipation.  ?Endocrine: Negative for polydipsia.  ?Genitourinary:  Negative for dysuria and frequency.  ?Musculoskeletal:  Negative for arthralgias and back pain.  ?Skin:  Positive for rash.  ?Neurological:  Negative for tremors, light-headedness and numbness.  ?Hematological:  Does not bruise/bleed easily.  ?Psychiatric/Behavioral:  Negative for agitation and behavioral problems.   ? ?Objective:  ?BP 126/85   Pulse (!) 102   Ht '5\' 8"'$  (1.727 m)   Wt 199 lb (90.3 kg)   LMP 09/27/2021 Comment: Patient reports a pulling and bleed from 4/16-4/24  SpO2 96%   BMI 30.26 kg/m?  ? ? ?  10/15/2021  ?  9:48 AM 10/06/2021  ?  8:55 AM 09/10/2021  ?  8:16 AM  ?BP/Weight  ?Systolic BP 220 254 270  ?Diastolic BP 85 85 98  ?Wt. (Lbs) 199 190 199  ?BMI 30.26 kg/m2 28.89 kg/m2 31.17 kg/m2  ? ? ? ? ?Physical Exam ?Constitutional:   ?   Appearance: She is well-developed.  ?HENT:  ?   Mouth/Throat:  ?   Comments: No oral lesions ?Cardiovascular:  ?   Rate and Rhythm: Normal rate.  ?   Heart sounds: Normal heart sounds. No murmur heard. ?Pulmonary:  ?   Effort:  Pulmonary effort is normal.  ?   Breath sounds: Normal breath sounds. No wheezing or rales.  ?Chest:  ?   Chest wall: No tenderness.  ?Abdominal:  ?  General: Bowel sounds are normal. There is no distension.  ?   Palpations: Abdomen is soft. There is no mass.  ?   Tenderness: There is no abdominal tenderness.  ?Musculoskeletal:     ?   General: Normal range of motion.  ?   Right lower leg: No edema.  ?   Left lower leg: No edema.  ?Skin: ?   Comments: Erythematous nodules on hands and feet.  See image below  ?Neurological:  ?   Mental Status: She is alert and oriented to person, place, and time.  ?Psychiatric:     ?   Mood and Affect: Mood normal.  ? ? ? ? ? ? ? ? ? ?  Latest Ref Rng & Units 09/10/2021  ?  9:23 AM 07/28/2021  ? 12:15 PM 04/02/2021  ?  8:34 PM  ?CMP  ?Glucose 70 - 99 mg/dL 91   90   100    ?BUN 6 - 23 mg/dL '17   13   11    '$ ?Creatinine 0.40 - 1.20 mg/dL 0.65   0.71   0.50    ?Sodium 135 - 145 mEq/L 142   141   136    ?Potassium 3.5 - 5.1 mEq/L 3.7   4.2   3.7    ?Chloride 96 - 112 mEq/L 106   104   105    ?CO2 19 - 32 mEq/L '28   21   24    '$ ?Calcium 8.4 - 10.5 mg/dL 9.6   9.1   9.1    ?Total Protein 6.0 - 8.3 g/dL 7.3      ?Total Bilirubin 0.2 - 1.2 mg/dL 0.3      ?Alkaline Phos 39 - 117 U/L 123      ?AST 0 - 37 U/L 19      ?ALT 0 - 35 U/L 22      ? ? ?Lipid Panel  ?   ?Component Value Date/Time  ? CHOL 216 (H) 02/09/2021 1203  ? TRIG 101 02/09/2021 1203  ? HDL 61 02/09/2021 1203  ? CHOLHDL 3.5 02/09/2021 1203  ? CHOLHDL 2.9 04/05/2018 0535  ? VLDL 13 04/05/2018 0535  ? LDLCALC 137 (H) 02/09/2021 1203  ? ? ?CBC ?   ?Component Value Date/Time  ? WBC 3.2 (L) 09/10/2021 6948  ? RBC 4.53 09/10/2021 0923  ? HGB 13.8 09/10/2021 0923  ? HGB 14.7 02/09/2021 1203  ? HCT 42.3 09/10/2021 0923  ? HCT 44.0 02/09/2021 1203  ? PLT 220.0 09/10/2021 0923  ? PLT 263 02/09/2021 1203  ? MCV 93.4 09/10/2021 0923  ? MCV 93 02/09/2021 1203  ? MCH 31.2 04/02/2021 2034  ? MCHC 32.7 09/10/2021 0923  ? RDW 13.6 09/10/2021 0923  ?  RDW 13.4 02/09/2021 1203  ? LYMPHSABS 1.4 04/02/2021 2034  ? LYMPHSABS 1.3 02/09/2021 1203  ? MONOABS 0.4 04/02/2021 2034  ? EOSABS 0.1 04/02/2021 2034  ? EOSABS 0.1 02/09/2021 1203  ? BASOSABS 0.0 04/02/2021 2034

## 2021-10-15 NOTE — Telephone Encounter (Signed)
?  Chief Complaint: Rash spreading ?Symptoms: Saw [facility] 10/06/21 for red raised rash, worsening, now with few bumps on stomach, hands.Is using Nystatin prescribed "I think that may be making it worse." Also reports left knee pain "Had it for years in right knee, now in left knee." ?Frequency: 10/06/21 ?Pertinent Negatives: Patient denies SOB, pain, swelling ?Disposition: '[]'$ ED /'[]'$ Urgent Care (no appt availability in office) / '[]'$ Appointment(In office/virtual)/ '[]'$  [facility] [facility]/ '[]'$ Home Care/ '[]'$ Refused Recommended Disposition /'[]'$ [facility] [facility]/ '[x]'$  Follow-up with PCP ?Additional Notes: No availability. Pt does not have access to Email to do [facility] virtual. Assured ptNT would route to practice for PCPs review and final disposition. CAre advise per protocol provided. Pt verbalizes understanding. Please advise. ?Reason for Disposition ? Mild widespread rash ? ?Answer Assessment - Initial Assessment Questions ?1. APPEARANCE of RASH: "Describe the rash." (e.g., spots, blisters, raised areas, skin peeling, scaly) ?    Red raised ?2. SIZE: "How big are the spots?" (e.g., tip of pen, eraser, coin; inches, centimeters) ?    Vary ?3. LOCATION: "Where is the rash located?" ?    Has spread to hands, worsening. Now "Few on stomach" ?4. COLOR: "What color is the rash?" (Note: It is difficult to assess rash color in people with darker-colored skin. When this situation occurs, simply ask the caller to describe what they see.) ?    red ?5. ONSET: "When did the rash begin?" ?    Seen in [place] 10/06/21, [place] ?6. FEVER: "Do you have a fever?" If Yes, ask: "What is your temperature, how was it measured, and when did it start?" ?    no ?7. ITCHING: "Does the rash itch?" If Yes, ask: "How bad is the itch?" (Scale 1-10; or mild, moderate, severe) ?    no ?8. CAUSE: "What do you think is causing the rash?" ?    Usure ?9. MEDICINE FACTORS: "Have you started any new medicines within the last 2 weeks?" (e.g., antibiotics)  ?    no ?10.  OTHER SYMPTOMS: "Do you have any other symptoms?" (e.g., dizziness, headache, sore throat, joint pain) ?      Both knees painful, not new symptom right knee, now  left knee painful ? ?Protocols used: Rash or Redness - Widespread-A-AH ? ?

## 2021-10-15 NOTE — Telephone Encounter (Signed)
Summary: rash not much better  ? Pt saw Cari on 4/25 for rash.  Pt states it is spreading, seems like a little worse. Please advise.   ?  ?Attempted to call patient- left message to call office to discuss symptoms ?

## 2021-10-15 NOTE — Telephone Encounter (Signed)
Patient has been called and informed to come in office at 10:10 for rash. ?

## 2021-10-15 NOTE — Progress Notes (Signed)
Please inform patient that her MRI brain is unchanged from prior, and there are no new strokes, bleeding or masses. Thanks

## 2021-10-16 ENCOUNTER — Other Ambulatory Visit: Payer: Managed Care, Other (non HMO)

## 2021-10-16 ENCOUNTER — Ambulatory Visit: Payer: Commercial Managed Care - HMO | Attending: Family Medicine

## 2021-10-16 ENCOUNTER — Other Ambulatory Visit: Payer: Self-pay

## 2021-10-17 LAB — CBC WITH DIFFERENTIAL/PLATELET
Basophils Absolute: 0 10*3/uL (ref 0.0–0.2)
Basos: 1 %
EOS (ABSOLUTE): 0 10*3/uL (ref 0.0–0.4)
Eos: 1 %
Hematocrit: 39.6 % (ref 34.0–46.6)
Hemoglobin: 13.1 g/dL (ref 11.1–15.9)
Immature Grans (Abs): 0 10*3/uL (ref 0.0–0.1)
Immature Granulocytes: 0 %
Lymphocytes Absolute: 1.4 10*3/uL (ref 0.7–3.1)
Lymphs: 29 %
MCH: 30.1 pg (ref 26.6–33.0)
MCHC: 33.1 g/dL (ref 31.5–35.7)
MCV: 91 fL (ref 79–97)
Monocytes Absolute: 0.6 10*3/uL (ref 0.1–0.9)
Monocytes: 13 %
Neutrophils Absolute: 2.6 10*3/uL (ref 1.4–7.0)
Neutrophils: 56 %
Platelets: 247 10*3/uL (ref 150–450)
RBC: 4.35 x10E6/uL (ref 3.77–5.28)
RDW: 13.2 % (ref 11.7–15.4)
WBC: 4.7 10*3/uL (ref 3.4–10.8)

## 2021-10-23 ENCOUNTER — Other Ambulatory Visit: Payer: Self-pay | Admitting: Family Medicine

## 2021-10-23 ENCOUNTER — Ambulatory Visit: Payer: Commercial Managed Care - HMO | Attending: Family Medicine | Admitting: Pharmacist

## 2021-10-23 ENCOUNTER — Encounter: Payer: Self-pay | Admitting: Pharmacist

## 2021-10-23 ENCOUNTER — Other Ambulatory Visit: Payer: Self-pay

## 2021-10-23 ENCOUNTER — Telehealth: Payer: Self-pay | Admitting: Pharmacist

## 2021-10-23 VITALS — BP 127/86 | HR 103

## 2021-10-23 DIAGNOSIS — I10 Essential (primary) hypertension: Secondary | ICD-10-CM

## 2021-10-23 DIAGNOSIS — F419 Anxiety disorder, unspecified: Secondary | ICD-10-CM

## 2021-10-23 MED ORDER — HYDROXYZINE HCL 10 MG PO TABS
10.0000 mg | ORAL_TABLET | Freq: Three times a day (TID) | ORAL | 1 refills | Status: DC | PRN
  Filled 2021-10-23: qty 60, 20d supply, fill #0
  Filled 2021-12-28: qty 60, 20d supply, fill #1

## 2021-10-23 NOTE — Patient Instructions (Addendum)
Thank you for coming to see us today.   Blood pressure today is improving.  Continue taking blood pressure medications as prescribed.   Limiting salt and caffeine, as well as exercising as able for at least 30 minutes for 5 days out of the week, can also help you lower your blood pressure.  Take your blood pressure at home if you are able. Please write down these numbers and bring them to your visits.  If you have any questions about medications, please call me [phone].  Luke  

## 2021-10-23 NOTE — Telephone Encounter (Signed)
Thank you Lurena Joiner.  I really need her to see Dermatology ?His info regarding her referral and she can call them to expedite the appointment. ?Sent Referral to [facility] Dermatology Dr Mickel Fuchs  ?Ph. # [phone] address [address] [place], [place] [address] ([place]). ? ?If she can undergo the knee x-ray that will assist in further management.  Thank you ?

## 2021-10-23 NOTE — Progress Notes (Signed)
? ?  S:    ?PCP: Dr. Margarita Rana  ? ?Patient arrives in good spirits. Presents to the clinic for hypertension evaluation, counseling, and management. Patient was originally referred to me by Freeman Caldron on 06/03/2021. She has been seen by her PCP several times since with her last visit occurring on 10/15/2021. BP was 126/85 at that visit. Of note, she goes through periods of time in her treatment when she goes without her antihypertensives. Her BP in clinic, therefore, tends to vary.  ? ?Medication adherence reported. She has taken her medication today.  ? ?Current BP Medications include:  losartan 25 mg daily, chlorthalidone 25 mg daily, metoprolol tartrate 50 mg BID ? ?Dietary habits include: does not add salt at the table. Does not eat out at fast food restaurants or take out.  ?Exercise habits include: not able to exercise due to pain. ?Family / Social history:  ?Fhx: HTN ?Tobacco: some day smoker  ?Alcohol: none ? ?O:  ?Today's Vitals  ? 10/23/21 0923  ?BP: 127/86  ?Pulse: (!) 103  ? ? ?There is no height or weight on file to calculate BMI. ? ?Home BP readings: none  ? ?Last 3 Office BP readings: ?BP Readings from Last 3 Encounters:  ?10/23/21 127/86  ?10/15/21 126/85  ?10/06/21 118/85  ? ? ?BMET ?   ?Component Value Date/Time  ? NA 142 09/10/2021 0923  ? NA 141 07/28/2021 1215  ? K 3.7 09/10/2021 0923  ? CL 106 09/10/2021 0923  ? CO2 28 09/10/2021 0923  ? GLUCOSE 91 09/10/2021 0923  ? BUN 17 09/10/2021 0923  ? BUN 13 07/28/2021 1215  ? CREATININE 0.65 09/10/2021 0923  ? CALCIUM 9.6 09/10/2021 0923  ? GFRNONAA >60 04/02/2021 2034  ? GFRAA 124 07/15/2020 1146  ? ? ?Renal function: ?CrCl cannot be calculated (Patient's most recent lab result is older than the maximum 21 days allowed.). ? ?Clinical ASCVD: Yes  ?The ASCVD Risk score (Arnett DK, et al., 2019) failed to calculate for the following reasons: ?  The patient has a prior MI or stroke diagnosis ? ? ?A/P: ?Hypertension longstanding currently close to goal on  current medications. BP Goal = < 130/80 mmHg. Medication adherence reported.  ?-Continued current regimen.  ?-Counseled on lifestyle modifications for blood pressure control including reduced dietary sodium, increased exercise, adequate sleep. ? ?Results reviewed and written information provided.   Total time in face-to-face counseling 30 minutes.   ?F/U Clinic Visit in July with PCP.   ? ?Benard Halsted, PharmD, BCACP, CPP ?Clinical Pharmacist ?[place] ?[phone] ? ? ? ? ? ?

## 2021-10-23 NOTE — Telephone Encounter (Signed)
Hey Dr. Margarita Rana,  ? ?This patient was placed on my schedule today by our call center for a BP check, however, she thought she was being seen by you. Her bp was good.  ? ?However, she is complaining of persistent lesions on her hands since seeing you despite clotrimazole. It looks like you have already placed a derm referral and that has been authorized. Let the pt know they would contact her.  ? ?Additionally, she has another complain of progressive, bilateral "aching" knee pain. She has active orders for XR but I told her I would make you aware of her pain. She is a poor historian and has some memory loss and a hx of stroke. I'm not sure if she remembered bringing these issues up but she has reported them in clinic in the past.  ?

## 2021-10-23 NOTE — Telephone Encounter (Signed)
Yes ma'am, pt called and given this information. She will call and make an appt with Dermatology. I also informed her where to go to get her imaging completed. She was thankful for your help! ?

## 2021-10-26 ENCOUNTER — Ambulatory Visit (HOSPITAL_COMMUNITY)
Admission: RE | Admit: 2021-10-26 | Discharge: 2021-10-26 | Disposition: A | Discharge status: Home / Self Care | Payer: Commercial Managed Care - HMO | Source: Ambulatory Visit | Attending: Nurse Practitioner | Admitting: Nurse Practitioner

## 2021-10-26 DIAGNOSIS — M2550 Pain in unspecified joint: Secondary | ICD-10-CM | POA: Insufficient documentation

## 2021-10-27 ENCOUNTER — Emergency Department (HOSPITAL_BASED_OUTPATIENT_CLINIC_OR_DEPARTMENT_OTHER)
Admission: EM | Admit: 2021-10-27 | Discharge: 2021-10-27 | Disposition: A | Discharge status: Home / Self Care | Payer: Commercial Managed Care - HMO | Attending: Emergency Medicine | Admitting: Emergency Medicine

## 2021-10-27 ENCOUNTER — Encounter (HOSPITAL_BASED_OUTPATIENT_CLINIC_OR_DEPARTMENT_OTHER): Payer: Self-pay

## 2021-10-27 ENCOUNTER — Other Ambulatory Visit: Payer: Self-pay

## 2021-10-27 DIAGNOSIS — Z7901 Long term (current) use of anticoagulants: Secondary | ICD-10-CM | POA: Insufficient documentation

## 2021-10-27 DIAGNOSIS — I10 Essential (primary) hypertension: Secondary | ICD-10-CM | POA: Diagnosis not present

## 2021-10-27 DIAGNOSIS — R21 Rash and other nonspecific skin eruption: Secondary | ICD-10-CM | POA: Diagnosis present

## 2021-10-27 DIAGNOSIS — B084 Enteroviral vesicular stomatitis with exanthem: Secondary | ICD-10-CM | POA: Diagnosis not present

## 2021-10-27 DIAGNOSIS — Z79899 Other long term (current) drug therapy: Secondary | ICD-10-CM | POA: Diagnosis not present

## 2021-10-27 HISTORY — DX: Cerebral infarction, unspecified: I63.9

## 2021-10-27 NOTE — Discharge Instructions (Addendum)
You can return back to work when she stopped getting new spots. ?You can use the Benadryl lotion to help with any itching.  Take Tylenol for pain. ?This is a virus and should pass on its own, continue using Dial soaps. ?

## 2021-10-27 NOTE — ED Notes (Signed)
Rash on palms ?

## 2021-10-27 NOTE — ED Notes (Signed)
Discharge paperwork given and understood. 

## 2021-10-27 NOTE — ED Triage Notes (Signed)
Pt states she was recently diagnosed with hand foot and mouth disease. Pt with noticeable rash, which she states is worsening and now peeling.  ?

## 2021-10-27 NOTE — ED Provider Notes (Signed)
?[facility] EMERGENCY DEPT ?Provider Note ? ? ?CSN: [id number] ?Arrival date & time: 10/27/21  1340 ? ?  ? ?History ? ?Chief Complaint  ?Patient presents with  ? Rash  ? ? ?Tina Moss is a 51 y.o. female. ? ? ?Rash ? ?Patient with medical history of hypertension, hyperlipidemia, prior strokes on Plavix presents today due to rash.  States she has had rash to her hands and soles of feet for the last 3 weeks to a month.  Started after having family over, she states she spends a decent time with children with family connections.  She works with her hands making biscuits.  Denies any fevers, the rash has new bumps that been popping up over the last few days.  They are sometimes pruritic, sometimes painful.  Denies any purulent discharge, has not spread elsewhere to her body.  Denies any history of sexual transmitted disease specifically syphilis, no recent unprotected sex. ? ?Home Medications ?Prior to Admission medications   ?Medication Sig Start Date End Date Taking? Authorizing Provider  ?atorvastatin (LIPITOR) 40 MG tablet Take 1 tablet (40 mg total) by mouth daily. at 6pm. NEEDS PASS 07/01/21 11/22/21  Charlott Rakes, MD  ?baclofen (LIORESAL) 10 MG tablet Take 1 tablet (10 mg total) by mouth 2 (two) times daily. 07/28/21   Charlott Rakes, MD  ?chlorthalidone (HYGROTON) 25 MG tablet Take 1 tablet (25 mg total) by mouth daily. 07/28/21   Charlott Rakes, MD  ?clopidogrel (PLAVIX) 75 MG tablet Take 1 tablet (75 mg total) by mouth daily. 07/01/21   Charlott Rakes, MD  ?clotrimazole (LOTRIMIN) 1 % cream Apply 1 application. topically 2 (two) times daily. 10/15/21   Charlott Rakes, MD  ?famotidine (PEPCID) 20 MG tablet Take 2 tablets (40 mg total) by mouth daily. ?Patient not taking: Reported on 09/10/2021 07/01/21 09/29/21  Charlott Rakes, MD  ?ferrous sulfate 325 (65 FE) MG tablet TAKE 1 TABLET (325 MG TOTAL) BY MOUTH 2 (TWO) TIMES DAILY WITH A MEAL. 07/01/21 11/22/21  Charlott Rakes, MD  ?hydrOXYzine  (ATARAX) 10 MG tablet Take 1 tablet (10 mg total) by mouth 3 (three) times daily as needed. 10/23/21   Charlott Rakes, MD  ?lidocaine (LIDODERM) 5 % Place 1 patch onto the skin daily. Remove & Discard patch within 12 hours or as directed by MD 07/28/21   Charlott Rakes, MD  ?losartan (COZAAR) 25 MG tablet Take 1 tablet (25 mg total) by mouth daily 07/01/21   Charlott Rakes, MD  ?meloxicam (MOBIC) 7.5 MG tablet Take 1 tablet (7.5 mg total) by mouth daily. 07/28/21   Charlott Rakes, MD  ?metoprolol tartrate (LOPRESSOR) 50 MG tablet Take 1 tablet (50 mg total) by mouth 2 (two) times daily. 07/01/21 11/22/21  Charlott Rakes, MD  ?nystatin-triamcinolone ointment (MYCOLOG) Apply topically 2 (two) times daily. 10/06/21   Mayers, Loraine Grip, PA-C  ?   ? ?Allergies    ?Patient has no known allergies.   ? ?Review of Systems   ?Review of Systems  ?Skin:  Positive for rash.  ? ?Physical Exam ?Updated Vital Signs ?BP (!) 146/100 (BP Location: Right Arm)   Pulse (!) 102   Temp 98.2 ?F (36.8 ?C) (Oral)   Resp 16   Ht '5\' 8"'$  (1.727 m)   Wt 90.3 kg   LMP 09/27/2021 Comment: Patient reports a pulling and bleed from 4/16-4/24  SpO2 96%   BMI 30.26 kg/m?  ?Physical Exam ?Vitals and nursing note reviewed. Exam conducted with a chaperone present.  ?Constitutional:   ?  General: She is not in acute distress. ?   Appearance: Normal appearance.  ?HENT:  ?   Head: Normocephalic and atraumatic.  ?Eyes:  ?   General: No scleral icterus. ?   Extraocular Movements: Extraocular movements intact.  ?   Pupils: Pupils are equal, round, and reactive to light.  ?Skin: ?   Coloration: Skin is not jaundiced.  ?   Findings: Rash present.  ?Neurological:  ?   Mental Status: She is alert. Mental status is at baseline.  ?   Coordination: Coordination normal.  ? ? ? ? ? ?ED Results / Procedures / Treatments   ?Labs ?(all labs ordered are listed, but only abnormal results are displayed) ?Labs Reviewed - No data to display ? ?EKG ?None ? ?Radiology ?DG Knee  Complete 4 Views Right ? ?Result Date: 10/27/2021 ?CLINICAL DATA:  Chronic right knee pain. EXAM: RIGHT KNEE - COMPLETE 4+ VIEW COMPARISON:  X-ray 09/09/2019. FINDINGS: No evidence of fracture, dislocation, or joint effusion. Joint spaces are maintained. There are minimal osteophytes in the medial and lateral compartments, minimally progressed. Linear sclerotic density is seen in the distal femoral diaphysis, similar to the prior examination and favored as benign. Soft tissues are within normal limits. IMPRESSION: 1. No acute bony abnormality. 2. Mild degenerative changes of the right knee have mildly progressed compared to the prior study. Electronically Signed   By: Ronney Asters M.D.   On: 10/27/2021 15:19   ? ?Procedures ?Procedures  ? ? ?Medications Ordered in ED ?Medications - No data to display ? ?ED Course/ Medical Decision Making/ A&P ?  ?                        ?Medical Decision Making ? ?Patient presents with rash to palms and soles of feet.  No systemic symptoms, I doubt this is a cellulitic infection.  Presentation is most consistent with hand-foot-and-mouth.  There are also some spots inside of her mouth.  Benadryl offered, supportive care advised.  Patient discharged in stable condition. ? ? ? ? ? ? ? ?Final Clinical Impression(s) / ED Diagnoses ?Final diagnoses:  ?Hand, foot and mouth disease  ? ? ?Rx / DC Orders ?ED Discharge Orders   ? ? None  ? ?  ? ? ?  ?Sherrill Raring, PA-C ?10/27/21 1621 ? ?  ?Lorelle Gibbs, DO ?10/27/21 2030 ? ?

## 2021-10-28 ENCOUNTER — Other Ambulatory Visit: Payer: Self-pay

## 2021-11-02 ENCOUNTER — Ambulatory Visit: Payer: Self-pay

## 2021-11-02 NOTE — Telephone Encounter (Signed)
    Chief Complaint: Hand foot mouth disease.Rash and sores in mouth, sore throat, difficulty eating. "Seems like it's getting worse." Seen in ED 10/27/21. Symptoms: Sore throat, fatigue Frequency: 2 months ago Pertinent Negatives: Patient denies fever Disposition: '[]'$ ED /'[]'$ Urgent Care (no appt availability in office) / '[]'$ Appointment(In office/virtual)/ '[]'$  [facility]/ '[]'$ Home Care/ '[]'$ Refused Recommended Disposition /'[]'$ [facility]/ '[x]'$  Follow-up with PCP Additional Notes: Pt. Asking to be worked in or have medication called in. Please advise pt.  Answer Assessment - Initial Assessment Questions 1. MOUTH SORES (ULCERS): "Are there any sores in the mouth?" If so, ask: "What do they look like?" "Where are they located?"     6-7 red dots 2.  APPEARANCE of RASH: "Describe the rash." (e.g., spots, blisters, raised areas, skin peeling, scaly)     Rash on hands and feet 3. LOCATION: "Where is the rash located?"      Hands, feet 4. COLOR: "What color is the rash?" Note: It is difficult to assess rash color in people with darker-colored skin. When this situation occurs, simply ask the caller to describe what they see.     Brown 5. ONSET: "When did the rash begin?"     2 months ago 6. FEVER: "Do you have a fever?" If Yes, ask: "What is your temperature, how was it measured, and when did it start?"     No 7. HAND FOOT AND MOUTH DISEASE EXPOSURE: "Has there been any exposure to Hand Foot and Mouth Disease within the past week?" If Yes, ask: "What type of contact occurred?"     Unsure 8. MEDICATION FACTORS: "Have you started any new medications within the last 2 weeks?" (e.g., antibiotics)      No 9. BETTER-SAME-WORSE: "Are you getting better, staying the same or getting worse compared to yesterday?"  If getting worse, ask, "In what way?"     Same 10. OTHER SYMPTOMS: "Do you have any other symptoms?" (e.g., dizziness, headache, joint pain, shortness of breath, sore throat,  weakness)       Sore throat, tired 11. PREGNANCY: "Is there any chance you are pregnant?" "When was your last menstrual period?"       No  Protocols used: Hand Foot and Mouth Disease - Diagnosed or Suspected-A-AH

## 2021-11-02 NOTE — Telephone Encounter (Signed)
Patient has appointment with Dermatologist in August

## 2021-11-03 ENCOUNTER — Other Ambulatory Visit: Payer: Self-pay

## 2021-11-03 MED ORDER — LIDOCAINE VISCOUS HCL 2 % MT SOLN
OROMUCOSAL | 0 refills | Status: DC
  Filled 2021-11-03: qty 200, fill #0

## 2021-11-03 MED ORDER — FLUOCINONIDE 0.05 % EX GEL
CUTANEOUS | 0 refills | Status: DC
  Filled 2021-11-03: qty 15, 5d supply, fill #0

## 2021-11-03 NOTE — Addendum Note (Signed)
Addended by: Karle Plumber B on: 11/03/2021 09:37 AM   Modules accepted: Orders

## 2021-11-03 NOTE — Telephone Encounter (Signed)
Patient was called and informed of medication being sent to pharmacy. Patient was also given appointment for 11/05/2021 at 210.

## 2021-11-04 ENCOUNTER — Other Ambulatory Visit: Payer: Self-pay

## 2021-11-04 ENCOUNTER — Other Ambulatory Visit: Payer: Self-pay | Admitting: Internal Medicine

## 2021-11-04 MED ORDER — TRIAMCINOLONE ACETONIDE 0.1 % MT PSTE
PASTE | OROMUCOSAL | 0 refills | Status: DC
Start: 2021-11-04 — End: 2022-03-16
  Filled 2021-11-04: qty 5, 10d supply, fill #0

## 2021-11-05 ENCOUNTER — Ambulatory Visit: Payer: Commercial Managed Care - HMO | Attending: Internal Medicine | Admitting: Internal Medicine

## 2021-11-05 ENCOUNTER — Other Ambulatory Visit: Payer: Self-pay

## 2021-11-05 VITALS — BP 145/93 | HR 108 | Ht 68.0 in | Wt 199.0 lb

## 2021-11-05 DIAGNOSIS — L309 Dermatitis, unspecified: Secondary | ICD-10-CM | POA: Diagnosis not present

## 2021-11-05 DIAGNOSIS — I10 Essential (primary) hypertension: Secondary | ICD-10-CM

## 2021-11-05 MED ORDER — LIDOCAINE VISCOUS HCL 2 % MT SOLN: OROMUCOSAL | 0 refills | Status: DC

## 2021-11-05 MED ORDER — LIDOCAINE VISCOUS HCL 2 % MT SOLN
OROMUCOSAL | 0 refills | Status: DC
  Filled 2021-11-05: qty 200, fill #0

## 2021-11-05 NOTE — Progress Notes (Signed)
Patient ID: Tina Moss, female    DOB: 1971-03-08  MRN: [id number]  CC: Rash   Subjective: Tina Moss is a 51 y.o. female who presents for UC visit Her concerns today include:   history of seizures (seizure free for the last 5 years and is not currently on antiseizure medication), multiple sclerosis, L MCA CVA in 03/2017 .  Patient presents complaining of having rash on the palms of the hands, soles of the feet and mouth for over a month.  Rash itches mainly at night.  No pain except for the lesions in the mouth.  She has pain from these lesions when trying to eat.  She has not had any fever.  She has not been sexually active.  Last sexual activity was about 6 months ago.  She was seen at urgent care recently and diagnosed with possible hand-foot-and-mouth disease.  Patient called in 3 days ago requesting medication for her mouth due to the pain and stating that she feels things were getting worse.  I sent a prescription for triamcinolone oral paste and viscous lidocaine to use as needed.  She just picked up the triamcinolone paste today.  She states the pharmacy did not have the viscous lidocaine.  She had seen Dr. Margarita Rana for the same earlier this month and was referred to dermatology.  She had negative HIV and syphilis screening in March of this year.  Patient states she was called by [facility] dermatology but the appointment is not until August. HTN: Blood pressure noted to be elevated.  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   Hyperlipidemia LDL goal <100 09/18/2019   Acute pain of right knee 09/17/2019   Hip pain, acute, right 09/17/2019   Abnormal uterine bleeding (AUB) 02/13/2019   History of Left middle  cerebral artery stroke 04/07/2018   Tobacco use 04/04/2018   Heart palpitations 12/14/2016   LVH (left ventricular hypertrophy) 12/14/2016   Depression 11/15/2016   Essential hypertension 11/15/2016   Fibroids 01/18/2012   Menorrhagia 01/18/2012   Seizure disorder ([place]) 01/18/2012   Anemia 01/18/2012     Current Outpatient Medications on File Prior to Visit  Medication Sig Dispense Refill   atorvastatin (LIPITOR) 40 MG tablet Take 1 tablet (40 mg total) by mouth daily. at 6pm. NEEDS PASS 30 tablet 2   baclofen (LIORESAL) 10 MG tablet Take 1 tablet (10 mg total) by mouth 2 (two) times daily. 60 each 3   chlorthalidone (HYGROTON) 25 MG tablet Take 1 tablet (25 mg total) by mouth daily. 30 tablet 3   clopidogrel (PLAVIX) 75 MG tablet Take 1 tablet (75 mg total) by mouth daily. 30 tablet 2   clotrimazole (LOTRIMIN) 1 % cream Apply 1 application. topically 2 (two) times daily. 60 g 1   ferrous sulfate 325 (65 FE) MG tablet TAKE 1 TABLET (325 MG TOTAL) BY MOUTH 2 (TWO) TIMES DAILY WITH A MEAL. 60 tablet 2   hydrOXYzine (ATARAX) 10 MG tablet Take 1 tablet (10 mg total) by mouth 3 (three) times daily as needed. 60 tablet 1   lidocaine (LIDODERM) 5 % Place 1 patch onto the skin daily. Remove & Discard patch within 12 hours or as directed by MD  30 patch 1   lidocaine (XYLOCAINE) 2 % solution Swish and spit 15 ml Q 4 hrs as needed for mouth and throat pain 200 mL 0   losartan (COZAAR) 25 MG tablet Take 1 tablet (25 mg total) by mouth daily 90 tablet 2   meloxicam (MOBIC) 7.5 MG tablet Take 1 tablet (7.5 mg total) by mouth daily. 30 tablet 3   metoprolol tartrate (LOPRESSOR) 50 MG tablet Take 1 tablet (50 mg total) by mouth 2 (two) times daily. 60 tablet 3   nystatin-triamcinolone ointment (MYCOLOG) Apply topically 2 (two) times daily. 30 g 0   triamcinolone (KENALOG) 0.1 % paste Apply small film to oral lesions/ulcers twice a day as needed. 5 g 0   famotidine (PEPCID) 20 MG tablet Take 2 tablets (40  mg total) by mouth daily. (Patient not taking: Reported on 09/10/2021) 60 tablet 2   No current facility-administered medications on file prior to visit.    No Known Allergies  Social History   Socioeconomic History   Marital status: Divorced    Spouse name: n/a   Number of children: 2   Years of education: 11th grade   Highest education level: 11th grade  Occupational History   Occupation: Housekeeper    Comment: [employer]  Tobacco Use   Smoking status: Some Days    Types: Cigars   Smokeless tobacco: Never   Tobacco comments:    "I think I just keep so much on my mind."  Vaping Use   Vaping Use: Never used  Substance and Sexual Activity   Alcohol use: Yes    Alcohol/week: 0.0 - 3.0 standard drinks    Comment: social   Drug use: Not Currently    Types: Marijuana    Comment: occasionally   Sexual activity: Yes    Partners: Male    Birth control/protection: None  Other Topics Concern   Not on file  Social History Narrative   Lives with her daughter.   Son is currently incarcerated in [place].   She completed 11th grade, but was kicked out and never when back, as she had become pregnant and was raising her daughter.   Divorced x 2.   Right handed   Drinks caffeine   One story home   Social Determinants of Health   Financial Resource Strain: Not on file  Food Insecurity: Not on file  Transportation Needs: Not on file  Physical Activity: Not on file  Stress: Not on file  Social Connections: Not on file  Intimate Partner Violence: Not on file    Family History  Problem Relation Age of Onset   Hypertension Mother    Hypertension Sister    Arthritis Sister        knees   Hypertension Brother    Deafness Brother    Speech disorder Brother        mute   Mental illness Brother        "he can just fly off"   Mental illness Daughter        ? bipolar   Scoliosis Son     Past Surgical History:  Procedure Laterality Date   CESAREAN SECTION     x2. at term   TEE  WITHOUT CARDIOVERSION N/A 04/06/2018   Procedure: TRANSESOPHAGEAL ECHOCARDIOGRAM (TEE) BUBBLE STUDY;  Surgeon: Lelon Perla, MD;  Location: [facility] ENDOSCOPY;  Service: Cardiovascular;  Laterality: N/A;   TUBAL LIGATION     WISDOM TOOTH EXTRACTION      ROS: Review  of Systems Negative except as stated above  PHYSICAL EXAM: BP (!) 145/93   Pulse (!) 108   Ht '5\' 8"'$  (1.727 m)   Wt 199 lb (90.3 kg)   SpO2 93%   BMI 30.26 kg/m   Physical Exam  General appearance - alert, well appearing, and in no distress Mental status - normal mood, behavior, speech, dress, motor activity, and thought processes Skin -patient noted to have target-like lesions on the palms of the hands and soles of the feet.  Lesions appear similar to photos that are already in the chart from her visit with PCP.  Small white areas noted on the tongue.  No lesions or ulcers seen on the buccal mucosa, hard and soft palate while in the throat.      Latest Ref Rng & Units 09/10/2021    9:23 AM 07/28/2021   12:15 PM 04/02/2021    8:34 PM  CMP  Glucose 70 - 99 mg/dL 91   90   100    BUN 6 - 23 mg/dL '17   13   11    '$ Creatinine 0.40 - 1.20 mg/dL 0.65   0.71   0.50    Sodium 135 - 145 mEq/L 142   141   136    Potassium 3.5 - 5.1 mEq/L 3.7   4.2   3.7    Chloride 96 - 112 mEq/L 106   104   105    CO2 19 - 32 mEq/L '28   21   24    '$ Calcium 8.4 - 10.5 mg/dL 9.6   9.1   9.1    Total Protein 6.0 - 8.3 g/dL 7.3      Total Bilirubin 0.2 - 1.2 mg/dL 0.3      Alkaline Phos 39 - 117 U/L 123      AST 0 - 37 U/L 19      ALT 0 - 35 U/L 22       Lipid Panel     Component Value Date/Time   CHOL 216 (H) 02/09/2021 1203   TRIG 101 02/09/2021 1203   HDL 61 02/09/2021 1203   CHOLHDL 3.5 02/09/2021 1203   CHOLHDL 2.9 04/05/2018 0535   VLDL 13 04/05/2018 0535   LDLCALC 137 (H) 02/09/2021 1203    CBC    Component Value Date/Time   WBC 4.7 10/16/2021 0843   WBC 3.2 (L) 09/10/2021 0923   RBC 4.35 10/16/2021 0843   RBC 4.53  09/10/2021 0923   HGB 13.1 10/16/2021 0843   HCT 39.6 10/16/2021 0843   PLT 247 10/16/2021 0843   MCV 91 10/16/2021 0843   MCH 30.1 10/16/2021 0843   MCH 31.2 04/02/2021 2034   MCHC 33.1 10/16/2021 0843   MCHC 32.7 09/10/2021 0923   RDW 13.2 10/16/2021 0843   LYMPHSABS 1.4 10/16/2021 0843   MONOABS 0.4 04/02/2021 2034   EOSABS 0.0 10/16/2021 0843   BASOSABS 0.0 10/16/2021 0843    ASSESSMENT AND PLAN:  1. Dermatitis I doubt this is hand mouth foot disease as this is usually limited lasting no more than a week.  Other possibility is erythema multiforme, HIV, syphilis.  Patient denies any history of genital or oral herpes.  No new medications.  She has had recent negative HIV and syphilis screening tests. I recommend using the viscous lidocaine and the triamcinolone paste as needed to help decrease the discomfort in her mouth so that she is able to eat.  Message sent to referral coordinator  to see if there is any possibility of her getting in with some of the dermatology group months sooner.  2. Essential hypertension Not at goal but she has not taken medicines as yet for today.  Advised her to do so once she returns home.   Patient was given the opportunity to ask questions.  Patient verbalized understanding of the plan and was able to repeat key elements of the plan.   This documentation was completed using Radio producer.  Any transcriptional errors are unintentional.  No orders of the defined types were placed in this encounter.    Requested Prescriptions    No prescriptions requested or ordered in this encounter    No follow-ups on file.  Karle Plumber, MD, FACP

## 2021-11-07 ENCOUNTER — Telehealth: Payer: Self-pay | Admitting: Internal Medicine

## 2021-11-07 NOTE — Telephone Encounter (Signed)
-----   Message from Ena Dawley sent at 11/06/2021  6:40 PM EDT ----- Regarding: Dermatology Referral Sent a new referral to   Sent Referral to [facility] ph# [phone]   address [address]. They will contact patient to schedule an appointment.  ----- Message ----- From: Ladell Pier, MD Sent: 11/05/2021   6:13 PM EDT To: Ena Dawley  Patient referred to dermatology by Dr. Margarita Rana.  Patient states the appointment is with [facility] dermatology but not until August.  She is wanting to know whether there is any other group in the area that can take her sooner.

## 2021-11-13 ENCOUNTER — Telehealth: Payer: Self-pay | Admitting: Family Medicine

## 2021-11-13 DIAGNOSIS — L309 Dermatitis, unspecified: Secondary | ICD-10-CM

## 2021-11-13 NOTE — Telephone Encounter (Signed)
Please call patient to schedule future labs as I received a call from her dermatologist with suspicion that her rash could be due to syphilis and I have ordered labs as a work up.Thanks.

## 2021-11-16 ENCOUNTER — Other Ambulatory Visit: Payer: Self-pay

## 2021-11-16 ENCOUNTER — Ambulatory Visit (INDEPENDENT_AMBULATORY_CARE_PROVIDER_SITE_OTHER): Payer: Commercial Managed Care - HMO | Admitting: Physician Assistant

## 2021-11-16 ENCOUNTER — Ambulatory Visit: Payer: Commercial Managed Care - HMO | Attending: Family Medicine

## 2021-11-16 ENCOUNTER — Encounter: Payer: Self-pay | Admitting: Physician Assistant

## 2021-11-16 VITALS — Resp 18 | Ht 67.0 in | Wt 194.0 lb

## 2021-11-16 DIAGNOSIS — G3184 Mild cognitive impairment, so stated: Secondary | ICD-10-CM

## 2021-11-16 DIAGNOSIS — L309 Dermatitis, unspecified: Secondary | ICD-10-CM

## 2021-11-16 MED ORDER — DONEPEZIL HCL 5 MG PO TABS
5.0000 mg | ORAL_TABLET | Freq: Every day | ORAL | 11 refills | Status: DC
  Filled 2021-11-16: qty 30, 30d supply, fill #0
  Filled 2021-12-28: qty 30, 30d supply, fill #1
  Filled 2022-03-08: qty 30, 30d supply, fill #2
  Filled 2022-04-05: qty 30, 30d supply, fill #3

## 2021-11-16 NOTE — Progress Notes (Signed)
Assessment/Plan:   Mild Cognitive Impairment  EEG was normal  No acute intracranial pathology. No significant change since the study from 04/03/2021. 2. 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     Recommendations:    Continue donepezil 10 mg daily Side effects were discussed Follow up in   months.   Case discussed with Dr. Delice Lesch who agrees with the plan     Subjective:    Tina Moss is a very pleasant 52 y.o. RH female  seen today in follow up for memory loss. This patient is accompanied in the office by her who supplements the history.  Previous records as well as any outside records available were reviewed prior to todays visit.  Patient was last seen at our office on  at which time her  Patient is currently on    Any changes in memory since last visit? Writes a note or list to help her.  Patient lives with: Spouse who noticed changes as well.  Patient lives alone repeats oneself?  Disoriented when walking into a room?  Patient denies   Leaving objects in unusual places?  Patient denies   Ambulates  with difficulty?   Patient denies   Recent falls?  Patient denies   Any head injuries?  Patient denies   History of seizures?   Patient denies   Wandering behavior?  Patient denies   Patient drives?   Patient no longer drives  Patient drives with assistance  Patient uses [employer] to drive   Any mood changes such irritability agitation?  Patient denies   Any history of depression?:  Patient denies   Hallucinations?  Patient denies   Paranoia?  Patient denies   Patient reports that he sleeps well without vivid dreams, REM behavior or sleepwalking   Patient reports vivid dreams   History of sleep apnea?  Patient denies   Any hygiene concerns?  Patient denies   Independent of bathing and dressing?   Endorsed  Does the patient needs help with medications?  pillbox pill pack  Patient denies   Who is in charge of the finances?  Patient is in charge   is in charge   assists the patient  and denies missing any bills   occasionally misses a payment. Any changes in appetite?  Patient denies   Patient have trouble swallowing? Patient denies   Does the patient cook?  Patient denies   Any kitchen accidents such as leaving the stove on? Patient denies   Any headaches?  Patient denies   The double vision? Patient denies   Any focal numbness or tingling?  Patient denies   Chronic back pain Patient denies   Unilateral weakness?  Patient denies   Any tremors?  Patient denies   Any history of anosmia?  Patient denies   Any incontinence of urine?  Patient denies   Any bowel dysfunction?   Patient denies     Constipation     diarrhea   Initial visit 09/10/21 The patient is seen in neurologic consultation at the request of Charlott Rakes, MD for the evaluation of memory.  The patient is here alone. This is a 51 y.o. year old RH  female who has no recalling when her memory issues began, "I will send my by Dr. Here".  She reports that she is very distracted, forgets appointments or things  to do, forgets the time and she is unable to comprehend well.  She states that when she was a child, she was diagnosed with epilepsy, but she is not aware when she stopped taking medications for it.  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  Of note, during this visit, the patient does have difficulty comprehending, expressing as well as being incoherent at times.  She works at [employer], and has noted that lately she has been burning some of the biscuits.  When she drives, sometimes she does not know where she goes with the car and has to stop, and she reports that occasionally she has a "blank ".  She leaves objects "everywhere, the other day I left my keys in the  drawer, and papers near the mattress ".  She ambulates without difficulty, although in the past she had right-sided weakness without residual due to the stroke.  Her mood is depressed, denies any intermittent irritability.  She sleeps fairly well, denies vivid dreams or sleepwalking.  She does have some hallucinations seeing "floating blocks, little colors dancing ".  Sometimes, she reports being paranoid.  She denies any hygiene concerns, she is independent of bathing and dressing.  The medications are in a pillbox, but she forgets sometimes to take them.  When asked about who is in charge of the finances, incoherent answer is reported, stating "there are calls that the insurance lady takes care of ".  She cannot give a straight answer regarding finances.  Appetite is good, denies trouble swallowing.  She denies any headaches, "sometimes I have double vision" denies any dizziness, focal numbness or tingling, or unilateral weakness.  She denies tremors or anosmia.  Denies urine incontinence, sometimes she has intermittent constipation and diarrhea.  Denies sleep apnea, or alcohol.  She smokes less than a pack a day "I used to smoke more ".    MRI brain 04/03/21 No acute intracranial abnormality. 2. 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 3. Mild right frontal paranasal sinus inflammation.   LAbs 09/09/21 TSH 1.040, T4 1.69, A1C 6, B12 1022     Past Surgical History:  Procedure Laterality Date   CESAREAN SECTION     x2. at term   TEE WITHOUT CARDIOVERSION N/A 04/06/2018   Procedure: TRANSESOPHAGEAL ECHOCARDIOGRAM (TEE) BUBBLE STUDY;  Surgeon: Lelon Perla, MD;  Location: [facility] ENDOSCOPY;  Service: Cardiovascular;  Laterality: N/A;   TUBAL LIGATION     WISDOM TOOTH EXTRACTION       PREVIOUS MEDICATIONS:   CURRENT MEDICATIONS:  Outpatient Encounter Medications as of 11/16/2021   Medication Sig   atorvastatin (LIPITOR) 40 MG tablet Take 1 tablet (40 mg total) by mouth daily. at 6pm. NEEDS PASS   baclofen (LIORESAL) 10 MG tablet Take 1 tablet (10 mg total) by mouth 2 (two) times daily.   chlorthalidone (HYGROTON) 25 MG tablet Take 1 tablet (25 mg total) by mouth daily.   clopidogrel (PLAVIX) 75 MG tablet Take 1 tablet (75 mg total) by mouth daily.   clotrimazole (LOTRIMIN) 1 % cream Apply 1 application. topically 2 (two) times daily.   ferrous sulfate 325 (65 FE) MG tablet TAKE 1 TABLET (325 MG TOTAL) BY MOUTH 2 (TWO) TIMES DAILY WITH A MEAL.   hydrOXYzine (ATARAX) 10 MG tablet Take 1 tablet (10 mg total) by mouth 3 (three) times  daily as needed.   lidocaine (LIDODERM) 5 % Place 1 patch onto the skin daily. Remove & Discard patch within 12 hours or as directed by MD   lidocaine (XYLOCAINE) 2 % solution Swish and spit 15 ml Q 4 hrs as needed for mouth and throat pain   losartan (COZAAR) 25 MG tablet Take 1 tablet (25 mg total) by mouth daily   meloxicam (MOBIC) 7.5 MG tablet Take 1 tablet (7.5 mg total) by mouth daily.   metoprolol tartrate (LOPRESSOR) 50 MG tablet Take 1 tablet (50 mg total) by mouth 2 (two) times daily.   nystatin-triamcinolone ointment (MYCOLOG) Apply topically 2 (two) times daily.   triamcinolone (KENALOG) 0.1 % paste Apply small film to oral lesions/ulcers twice a day as needed.   famotidine (PEPCID) 20 MG tablet Take 2 tablets (40 mg total) by mouth daily. (Patient not taking: Reported on 09/10/2021)   No facility-administered encounter medications on file as of 11/16/2021.     Objective:     PHYSICAL EXAMINATION:    VITALS:   Vitals:   11/16/21 1305  Resp: 18  Weight: 194 lb (88 kg)  Height: '5\' 7"'$  (1.702 m)    GEN:  The patient appears stated age and is in NAD. HEENT:  Normocephalic, atraumatic.   Neurological examination:  General: NAD, well-groomed, appears stated age. Orientation: The patient is alert. Oriented to person, place  and date Cranial nerves: There is good facial symmetry.The speech is fluent and clear. No aphasia or dysarthria. Fund of knowledge is appropriate. Recent memory impaired and remote memory is normal.  Attention and concentration are normal.  Able to name objects and repeat phrases.  Hearing is intact to conversational tone.    Sensation: Sensation is intact to light touch throughout Motor: Strength is at least antigravity x4. Tremors: none  DTR's 2/4 in UE/LE      09/10/2021    8:00 AM  Montreal Cognitive Assessment   Visuospatial/ Executive (0/5) 3  Naming (0/3) 2  Attention: Read list of digits (0/2) 2  Attention: Read list of letters (0/1) 1  Attention: Serial 7 subtraction starting at 100 (0/3) 0  Language: Repeat phrase (0/2) 0  Language : Fluency (0/1) 1  Abstraction (0/2) 0  Delayed Recall (0/5) 2  Orientation (0/6) 6  Total 17  Adjusted Score (based on education) 18       09/09/2021   10:56 AM 02/16/2017   12:00 PM  MMSE - Mini Mental State Exam  Not completed:  Unable to complete  Orientation to time 4   Orientation to Place 5   Registration 3   Attention/ Calculation 5   Recall 2   Language- name 2 objects 2   Language- repeat 1   Language- follow 3 step command 3   Language- read & follow direction 1   Write a sentence 1   Copy design 1   Total score 28        Movement examination: Tone: There is normal tone in the UE/LE Abnormal movements:  no tremor.  No myoclonus.  No asterixis.   Coordination:  There is no decremation with RAM's. Normal finger to nose  Gait and Station: The patient has no difficulty arising out of a deep-seated chair without the use of the hands. The patient's stride length is good.  Gait is cautious and narrow.   Thank you for allowing [place] the opportunity to participate in the care of this nice patient. Please do not hesitate to contact  us for any questions or concerns.   Total time spent on today's visit was *** minutes dedicated to  this patient today, preparing to see patient, examining the patient, ordering tests and/or medications and counseling the patient, documenting clinical information in the EHR or other health record, independently interpreting results and communicating results to the patient/family, discussing treatment and goals, answering patient's questions and coordinating care.  Cc:  Charlott Rakes, MD  Sharene Butters 11/16/2021 1:19 PM   Cc:  Charlott Rakes, MD Sharene Butters, PA-C

## 2021-11-16 NOTE — Telephone Encounter (Signed)
Patient has been called and informed to come in for lab work.

## 2021-11-16 NOTE — Patient Instructions (Signed)
It was a pleasure to see you today at our office.   Recommendations:  Follow up in  6 months Start Donepezil '5mg'$  1 tablet  daily.  Whom to call:  Memory  decline, memory medications: Call our office [phone]   For psychiatric meds, mood meds: Please have your primary care physician manage these medications.   Counseling regarding caregiver distress, including caregiver depression, anxiety and issues regarding community resources, adult day care programs, adult living facilities, or memory care questions:   Feel free to contact [place], Social Worker at [phone]   For assessment of decision of mental capacity and competency:  Call Dr. Anthoney Harada, geriatric psychiatrist at [phone]   If you have any severe symptoms of a stroke, or other severe issues such as confusion,severe chills or fever, etc call 911 or go to the ER as you may need to be evaluated further      RECOMMENDATIONS FOR ALL PATIENTS WITH MEMORY PROBLEMS: 1. Continue to exercise (Recommend 30 minutes of walking everyday, or 3 hours every week) 2. Increase social interactions - continue going to [place] and enjoy social gatherings with friends and family 3. Eat healthy, avoid fried foods and eat more fruits and vegetables 4. Maintain adequate blood pressure, blood sugar, and blood cholesterol level. Reducing the risk of stroke and cardiovascular disease also helps promoting better memory. 5. Avoid stressful situations. Live a simple life and avoid aggravations. Organize your time and prepare for the next day in anticipation. 6. Sleep well, avoid any interruptions of sleep and avoid any distractions in the bedroom that may interfere with adequate sleep quality 7. Avoid sugar, avoid sweets as there is a strong link between excessive sugar intake, diabetes, and cognitive impairment We discussed the Mediterranean diet, which has been shown to help patients reduce the risk of progressive memory  disorders and reduces cardiovascular risk. This includes eating fish, eat fruits and green leafy vegetables, nuts like almonds and hazelnuts, walnuts, and also use olive oil. Avoid fast foods and fried foods as much as possible. Avoid sweets and sugar as sugar use has been linked to worsening of memory function.  There is always a concern of gradual progression of memory problems. If this is the case, then we may need to adjust level of care according to patient needs. Support, both to the patient and caregiver, should then be put into place.    FALL PRECAUTIONS: Be cautious when walking. Scan the area for obstacles that may increase the risk of trips and falls. When getting up in the mornings, sit up at the edge of the bed for a few minutes before getting out of bed. Consider elevating the bed at the head end to avoid drop of blood pressure when getting up. Walk always in a well-lit room (use night lights in the walls). Avoid area rugs or power cords from appliances in the middle of the walkways. Use a walker or a cane if necessary and consider physical therapy for balance exercise. Get your eyesight checked regularly.  FINANCIAL OVERSIGHT: Supervision, especially oversight when making financial decisions or transactions is also recommended.  HOME SAFETY: Consider the safety of the kitchen when operating appliances like stoves, microwave oven, and blender. Consider having supervision and share cooking responsibilities until no longer able to participate in those. Accidents with firearms and other hazards in the house should be identified and addressed as well.   ABILITY TO BE LEFT ALONE: If patient is unable to contact 911 operator, consider  using [facility], or when the need is there, arrange for someone to stay with patients. Smoking is a fire hazard, consider supervision or cessation. Risk of wandering should be assessed by caregiver and if detected at any point, supervision and safe proof  recommendations should be instituted.  MEDICATION SUPERVISION: Inability to self-administer medication needs to be constantly addressed. Implement a mechanism to ensure safe administration of the medications.   DRIVING: Regarding driving, in patients with progressive memory problems, driving will be impaired. We advise to have someone else do the driving if trouble finding directions or if minor accidents are reported. Independent driving assessment is available to determine safety of driving.   If you are interested in the driving assessment, you can contact the following:  [employer] in [place]  [place] [phone]  [place]  [facility] [phone] or [phone]

## 2021-11-18 ENCOUNTER — Encounter: Payer: Self-pay | Admitting: Family Medicine

## 2021-11-18 ENCOUNTER — Encounter: Payer: Self-pay | Admitting: Physician Assistant

## 2021-11-18 ENCOUNTER — Ambulatory Visit: Payer: Commercial Managed Care - HMO | Attending: Family Medicine | Admitting: Family Medicine

## 2021-11-18 VITALS — BP 129/86 | HR 115 | Ht 67.0 in | Wt 193.6 lb

## 2021-11-18 DIAGNOSIS — A539 Syphilis, unspecified: Secondary | ICD-10-CM | POA: Diagnosis not present

## 2021-11-18 LAB — RPR, QUANT: RPR, Quant: 1:128 {titer} — ABNORMAL HIGH

## 2021-11-18 LAB — RPR W/REFLEX TO TREPSURE: RPR: REACTIVE — AB

## 2021-11-18 MED ORDER — PENICILLIN G BENZATHINE 2400000 UNIT/4ML IM SUSY
2.4000 10*6.[IU] | PREFILLED_SYRINGE | Freq: Once | INTRAMUSCULAR | Status: AC
  Administered 2021-11-18: 2400000 [IU] via INTRAMUSCULAR

## 2021-11-18 NOTE — Progress Notes (Signed)
Subjective:  Patient ID: Tina Moss, female    DOB: [birth year]  Age: 51 y.o. MRN: [id number]  CC: No chief complaint on file.   HPI Tina Moss is a 51 y.o. year old female with a history of  seizures (seizure free for the last 5 years and is not currently on antiseizure medication), multiple sclerosis, L MCA CVA in 03/2017 . She is seen today for an acute visit.  Interval History: Had called her in for an office visit due to syphilis screening by means of RPR which returned reactive with RPR quant of 1: 128. She had previously had papular rash on her palms and soles which are now starting to have disclamation and she now has lesions on her tongue as well. Previous RPR performed by neurology 2 months ago was negative. She had also been to see the dermatologist and failed topical treatment in the past. She states last sexual activity was 6 months ago. Past Medical History:  Diagnosis Date   Anemia    CVA (cerebral vascular accident) ([place])    2020   Epilepsy ([place])    Fibroids    H/O bacterial infection    H/O mumps    Hyperlipidemia    Hypertension    Seizures ([place])     Past Surgical History:  Procedure Laterality Date   CESAREAN SECTION     x2. at term   TEE WITHOUT CARDIOVERSION N/A 04/06/2018   Procedure: TRANSESOPHAGEAL ECHOCARDIOGRAM (TEE) BUBBLE STUDY;  Surgeon: Lelon Perla, MD;  Location: [facility] ENDOSCOPY;  Service: Cardiovascular;  Laterality: N/A;   TUBAL LIGATION     WISDOM TOOTH EXTRACTION      Family History  Problem Relation Age of Onset   Hypertension Mother    Hypertension Sister    Arthritis Sister        knees   Hypertension Brother    Deafness Brother    Speech disorder Brother        mute   Mental illness Brother        "he can just fly off"   Mental illness Daughter        ? bipolar   Scoliosis Son     Social History   Socioeconomic History   Marital status: Divorced    Spouse name: n/a   Number of children: 2   Years of  education: 11th grade   Highest education level: 11th grade  Occupational History   Occupation: Housekeeper    Comment: [employer]  Tobacco Use   Smoking status: Some Days    Types: Cigars   Smokeless tobacco: Never   Tobacco comments:    "I think I just keep so much on my mind."  Vaping Use   Vaping Use: Never used  Substance and Sexual Activity   Alcohol use: Yes    Alcohol/week: 0.0 - 3.0 standard drinks    Comment: social   Drug use: Not Currently    Types: Marijuana    Comment: occasionally   Sexual activity: Yes    Partners: Male    Birth control/protection: None  Other Topics Concern   Not on file  Social History Narrative   Lives with her daughter.   Son is currently incarcerated in [place].   She completed 11th grade, but was kicked out and never when back, as she had become pregnant and was raising her daughter.   Divorced x 2.   Right handed   Drinks caffeine   One story home  Social Determinants of Health   Financial Resource Strain: Not on file  Food Insecurity: Not on file  Transportation Needs: Not on file  Physical Activity: Not on file  Stress: Not on file  Social Connections: Not on file    No Known Allergies  Outpatient Medications Prior to Visit  Medication Sig Dispense Refill   atorvastatin (LIPITOR) 40 MG tablet Take 1 tablet (40 mg total) by mouth daily. at 6pm. NEEDS PASS 30 tablet 2   baclofen (LIORESAL) 10 MG tablet Take 1 tablet (10 mg total) by mouth 2 (two) times daily. 60 each 3   chlorthalidone (HYGROTON) 25 MG tablet Take 1 tablet (25 mg total) by mouth daily. 30 tablet 3   clopidogrel (PLAVIX) 75 MG tablet Take 1 tablet (75 mg total) by mouth daily. 30 tablet 2   clotrimazole (LOTRIMIN) 1 % cream Apply 1 application. topically 2 (two) times daily. 60 g 1   donepezil (ARICEPT) 5 MG tablet Take 1 tablet (5 mg total) by mouth at bedtime. 30 tablet 11   ferrous sulfate 325 (65 FE) MG tablet TAKE 1 TABLET (325 MG TOTAL) BY MOUTH 2 (TWO) TIMES  DAILY WITH A MEAL. 60 tablet 2   hydrOXYzine (ATARAX) 10 MG tablet Take 1 tablet (10 mg total) by mouth 3 (three) times daily as needed. 60 tablet 1   lidocaine (LIDODERM) 5 % Place 1 patch onto the skin daily. Remove & Discard patch within 12 hours or as directed by MD 30 patch 1   lidocaine (XYLOCAINE) 2 % solution Swish and spit 15 ml Q 4 hrs as needed for mouth and throat pain 200 mL 0   losartan (COZAAR) 25 MG tablet Take 1 tablet (25 mg total) by mouth daily 90 tablet 2   meloxicam (MOBIC) 7.5 MG tablet Take 1 tablet (7.5 mg total) by mouth daily. 30 tablet 3   metoprolol tartrate (LOPRESSOR) 50 MG tablet Take 1 tablet (50 mg total) by mouth 2 (two) times daily. 60 tablet 3   nystatin-triamcinolone ointment (MYCOLOG) Apply topically 2 (two) times daily. 30 g 0   triamcinolone (KENALOG) 0.1 % paste Apply small film to oral lesions/ulcers twice a day as needed. 5 g 0   famotidine (PEPCID) 20 MG tablet Take 2 tablets (40 mg total) by mouth daily. (Patient not taking: Reported on 09/10/2021) 60 tablet 2   No facility-administered medications prior to visit.     ROS Review of Systems  Constitutional:  Negative for activity change and appetite change.  HENT:  Negative for sinus pressure and sore throat.   Respiratory:  Negative for chest tightness, shortness of breath and wheezing.   Cardiovascular:  Negative for chest pain and palpitations.  Gastrointestinal:  Negative for abdominal distention, abdominal pain and constipation.  Genitourinary: Negative.   Musculoskeletal: Negative.   Skin:  Positive for rash.  Psychiatric/Behavioral:  Negative for behavioral problems and dysphoric mood.    Objective:  BP 129/86   Pulse (!) 115   Ht '5\' 7"'$  (1.702 m)   Wt 193 lb 9.6 oz (87.8 kg)   SpO2 93%   BMI 30.32 kg/m      11/18/2021    2:45 PM 11/16/2021    1:05 PM 11/05/2021    2:15 PM  BP/Weight  Systolic BP 235  361  Diastolic BP 86  93  Wt. (Lbs) 193.6 194 199  BMI 30.32 kg/m2 30.38  kg/m2 30.26 kg/m2      Physical Exam Constitutional:  Appearance: She is well-developed.  Cardiovascular:     Rate and Rhythm: Tachycardia present.     Heart sounds: Normal heart sounds. No murmur heard. Pulmonary:     Effort: Pulmonary effort is normal.     Breath sounds: Normal breath sounds. No wheezing or rales.  Chest:     Chest wall: No tenderness.  Abdominal:     General: Bowel sounds are normal. There is no distension.     Palpations: Abdomen is soft. There is no mass.     Tenderness: There is no abdominal tenderness.  Musculoskeletal:        General: Normal range of motion.     Right lower leg: No edema.     Left lower leg: No edema.  Skin:    Comments: Rash with disclamation on palms and soles of feet Maculopapular lesions on tongue  Neurological:     Mental Status: She is alert and oriented to person, place, and time.  Psychiatric:        Mood and Affect: Mood normal.       Latest Ref Rng & Units 09/10/2021    9:23 AM 07/28/2021   12:15 PM 04/02/2021    8:34 PM  CMP  Glucose 70 - 99 mg/dL 91   90   100    BUN 6 - 23 mg/dL '17   13   11    '$ Creatinine 0.40 - 1.20 mg/dL 0.65   0.71   0.50    Sodium 135 - 145 mEq/L 142   141   136    Potassium 3.5 - 5.1 mEq/L 3.7   4.2   3.7    Chloride 96 - 112 mEq/L 106   104   105    CO2 19 - 32 mEq/L '28   21   24    '$ Calcium 8.4 - 10.5 mg/dL 9.6   9.1   9.1    Total Protein 6.0 - 8.3 g/dL 7.3      Total Bilirubin 0.2 - 1.2 mg/dL 0.3      Alkaline Phos 39 - 117 U/L 123      AST 0 - 37 U/L 19      ALT 0 - 35 U/L 22        Lipid Panel     Component Value Date/Time   CHOL 216 (H) 02/09/2021 1203   TRIG 101 02/09/2021 1203   HDL 61 02/09/2021 1203   CHOLHDL 3.5 02/09/2021 1203   CHOLHDL 2.9 04/05/2018 0535   VLDL 13 04/05/2018 0535   LDLCALC 137 (H) 02/09/2021 1203    CBC    Component Value Date/Time   WBC 4.7 10/16/2021 0843   WBC 3.2 (L) 09/10/2021 0923   RBC 4.35 10/16/2021 0843   RBC 4.53 09/10/2021  0923   HGB 13.1 10/16/2021 0843   HCT 39.6 10/16/2021 0843   PLT 247 10/16/2021 0843   MCV 91 10/16/2021 0843   MCH 30.1 10/16/2021 0843   MCH 31.2 04/02/2021 2034   MCHC 33.1 10/16/2021 0843   MCHC 32.7 09/10/2021 0923   RDW 13.2 10/16/2021 0843   LYMPHSABS 1.4 10/16/2021 0843   MONOABS 0.4 04/02/2021 2034   EOSABS 0.0 10/16/2021 0843   BASOSABS 0.0 10/16/2021 0843    Lab Results  Component Value Date   HGBA1C 6.0 (H) 09/09/2021    Assessment & Plan:  1. 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   Follow-up: Return for follow up, keep previously scheduled appointment.       Charlott Rakes, MD, FAAFP. [facility] and [place], [place]   11/18/2021, 3:02 PM

## 2021-11-18 NOTE — Patient Instructions (Signed)
Syphilis Test Why am I having this test? The syphilis test is used to diagnose syphilis and to help monitor syphilis treatment. Syphilis is a bacterial infection that commonly spreads through sexual contact. You may have this test if you have symptoms of syphilis, such as a painless sore (chancre). Symptoms often look like the symptoms of many other conditions. Untreated syphilis can lead to severe complications, including damage to different organ systems, especially the heart and nervous system. Syphilis may also spread to an unborn baby (fetus) during pregnancy. People who are pregnant often have a syphilis test. You may also have this test if you are at risk for syphilis because of: Having unprotected sex. Having a sexual partner with syphilis. Having a history of other sexually transmitted infections (STIs). Using recreational drugs such as methamphetamines, injection drugs, or heroin. What is being tested? Blood tests check for antibodies to the bacteria that cause syphilis. Antibodies are proteins that your body makes in response to germs and other things that can make you sick. There are two types of blood tests to check for syphilis, and both are necessary to make a diagnosis. The two tests are: Nontreponemal test. This is usually the first test. It can detect other kinds of antibodies as well. Treponemal test. This test is done if you get a positive result on the nontreponemal test. It specifically looks for antibodies to syphilis. This test will not show whether antibodies are from a past syphilis infection or a current infection. A lumbar puncture may also be done if you are showing signs and symptoms of late-stage syphilis. What kind of sample is taken? Syphilis can be diagnosed by testing: Your blood. A sample is usually collected by inserting a needle into a blood vessel. The fluid that surrounds the brain and spinal cord (cerebrospinal fluid, orCSF). This is collected by inserting a  needle into the space between two vertebrae of the lower back. How do I prepare for this test? No special preparation is needed for the syphilis blood tests. You may be instructed to empty your bladder and bowels if you will be having a lumbar puncture. Talk to your health care provider about any medical conditions you may have. How are the results reported? Your results will be reported as positive or negative for syphilis antibodies. What do the results mean? If the result of your syphilis test is negative, no antibodies were present at the time of the test. This could mean: You do not have syphilis. The antibodies have not formed yet. Antibodies can take several weeks to form. If it is possible that you were recently exposed to syphilis, you may need to have the test again at a later time. If you test positive for syphilis on the nontreponemal test, you will most likely have the treponemal test to confirm the diagnosis. If the result of the second test is also positive, it is likely that you have syphilis. If the result of the second test is negative, you may need more tests to make sure that you do not have syphilis. Talk with your health care provider about what your results mean. Questions to ask your health care provider Ask your health care provider, or the department that is doing the test: When will my results be ready? How will I get my results? What are my treatment options? What other tests do I need? What are my next steps? Summary The syphilis test is used to diagnose syphilis and to help monitor syphilis treatment. This  test checks your blood for antibodies to the bacteria that cause syphilis. There are two types of blood tests to check for syphilis. Both tests are necessary to make a diagnosis. A lumbar puncture may also be done if you are showing signs and symptoms of late-stage syphilis. Talk with your health care provider about what your results mean. This information is  not intended to replace advice given to you by your health care provider. Make sure you discuss any questions you have with your health care provider. Document Revised: 04/24/2021 Document Reviewed: 04/24/2021 Elsevier Patient Education  [place].

## 2021-11-18 NOTE — Progress Notes (Signed)
Dr. Margarita Rana kindly contacted our office to inform that patient has testes positive for Syphilis. We thanked her for informing us. Patient will need to schedule her follow up appt once RPR is negative, not earlier to prevent contagion to our other patients

## 2021-11-19 ENCOUNTER — Telehealth: Payer: Self-pay | Admitting: *Deleted

## 2021-11-19 NOTE — Telephone Encounter (Signed)
Tina Moss was called and a VM was left informing him to return phone call.

## 2021-11-19 NOTE — Telephone Encounter (Signed)
Nicholas Lose with [facility] calling. Questioning if Dr. Margarita Rana saw positive RPR results on pt. Advised she had and messaged pt regarding results. Asking additional questions regarding symptoms. Advised NT would route to practice for PCPs review.  CB# [phone]

## 2021-11-26 ENCOUNTER — Ambulatory Visit: Payer: Self-pay

## 2021-11-26 NOTE — Telephone Encounter (Signed)
   Chief Complaint: Low back pain Symptoms: Hurts mainly when moving around. Frequency: 1 year ago Pertinent Negatives: Patient denies weakness Disposition: '[]'$ ED /'[]'$ Urgent Care (no appt availability in office) / '[]'$ Appointment(In office/virtual)/ '[]'$  [facility]/ '[]'$ Home Care/ '[]'$ Refused Recommended Disposition /'[]'$ [place] [facility]/ '[x]'$  Follow-up with PCP Additional Notes: No availability until July, asking to be worked in. Please advise.  Answer Assessment - Initial Assessment Questions 1. ONSET: "When did the pain begin?"      1 year ago 2. LOCATION: "Where does it hurt?" (upper, mid or lower back)     Low 3. SEVERITY: "How bad is the pain?"  (e.g., Scale 1-10; mild, moderate, or severe)   - MILD (1-3): doesn't interfere with normal activities    - MODERATE (4-7): interferes with normal activities or awakens from sleep    - SEVERE (8-10): excruciating pain, unable to do any normal activities      Now -7 4. PATTERN: "Is the pain constant?" (e.g., yes, no; constant, intermittent)      Comes and goes 5. RADIATION: "Does the pain shoot into your legs or elsewhere?"     Back of legs 6. CAUSE:  "What do you think is causing the back pain?"      Unsure 7. BACK OVERUSE:  "Any recent lifting of heavy objects, strenuous work or exercise?"     No 8. MEDICATIONS: "What have you taken so far for the pain?" (e.g., nothing, acetaminophen, NSAIDS)     Nothing 9. NEUROLOGIC SYMPTOMS: "Do you have any weakness, numbness, or problems with bowel/bladder control?"     No 10. OTHER SYMPTOMS: "Do you have any other symptoms?" (e.g., fever, abdominal pain, burning with urination, blood in urine)       No 11. PREGNANCY: "Is there any chance you are pregnant?" (e.g., yes, no; LMP)       No  Protocols used: Back Pain-A-AH

## 2021-12-02 ENCOUNTER — Ambulatory Visit: Payer: Commercial Managed Care - HMO | Admitting: Critical Care Medicine

## 2021-12-28 ENCOUNTER — Ambulatory Visit: Payer: Commercial Managed Care - HMO | Attending: Family Medicine | Admitting: Family Medicine

## 2021-12-28 ENCOUNTER — Ambulatory Visit (HOSPITAL_BASED_OUTPATIENT_CLINIC_OR_DEPARTMENT_OTHER): Payer: Commercial Managed Care - HMO | Admitting: Family Medicine

## 2021-12-28 ENCOUNTER — Telehealth: Payer: Self-pay | Admitting: Licensed Clinical Social Worker

## 2021-12-28 ENCOUNTER — Other Ambulatory Visit: Payer: Self-pay | Admitting: Family Medicine

## 2021-12-28 ENCOUNTER — Other Ambulatory Visit: Payer: Self-pay

## 2021-12-28 VITALS — BP 150/102 | HR 101 | Temp 98.0°F | Ht 67.0 in | Wt 192.4 lb

## 2021-12-28 DIAGNOSIS — Z638 Other specified problems related to primary support group: Secondary | ICD-10-CM | POA: Insufficient documentation

## 2021-12-28 DIAGNOSIS — I10 Essential (primary) hypertension: Secondary | ICD-10-CM

## 2021-12-28 DIAGNOSIS — Z79899 Other long term (current) drug therapy: Secondary | ICD-10-CM | POA: Insufficient documentation

## 2021-12-28 DIAGNOSIS — M5441 Lumbago with sciatica, right side: Secondary | ICD-10-CM

## 2021-12-28 DIAGNOSIS — R200 Anesthesia of skin: Secondary | ICD-10-CM | POA: Diagnosis not present

## 2021-12-28 DIAGNOSIS — R0789 Other chest pain: Secondary | ICD-10-CM | POA: Insufficient documentation

## 2021-12-28 DIAGNOSIS — R079 Chest pain, unspecified: Secondary | ICD-10-CM | POA: Diagnosis not present

## 2021-12-28 DIAGNOSIS — F17290 Nicotine dependence, other tobacco product, uncomplicated: Secondary | ICD-10-CM | POA: Diagnosis not present

## 2021-12-28 DIAGNOSIS — F439 Reaction to severe stress, unspecified: Secondary | ICD-10-CM | POA: Insufficient documentation

## 2021-12-28 DIAGNOSIS — Z8673 Personal history of transient ischemic attack (TIA), and cerebral infarction without residual deficits: Secondary | ICD-10-CM | POA: Insufficient documentation

## 2021-12-28 DIAGNOSIS — M549 Dorsalgia, unspecified: Secondary | ICD-10-CM | POA: Diagnosis not present

## 2021-12-28 DIAGNOSIS — Z91148 Patient's other noncompliance with medication regimen for other reason: Secondary | ICD-10-CM | POA: Insufficient documentation

## 2021-12-28 DIAGNOSIS — R519 Headache, unspecified: Secondary | ICD-10-CM | POA: Diagnosis not present

## 2021-12-28 DIAGNOSIS — G35 Multiple sclerosis: Secondary | ICD-10-CM | POA: Insufficient documentation

## 2021-12-28 DIAGNOSIS — D500 Iron deficiency anemia secondary to blood loss (chronic): Secondary | ICD-10-CM

## 2021-12-28 DIAGNOSIS — G8929 Other chronic pain: Secondary | ICD-10-CM

## 2021-12-28 DIAGNOSIS — M5442 Lumbago with sciatica, left side: Secondary | ICD-10-CM | POA: Insufficient documentation

## 2021-12-28 DIAGNOSIS — Z9119 Patient's noncompliance with other medical treatment and regimen due to financial hardship: Secondary | ICD-10-CM

## 2021-12-28 MED ORDER — DULOXETINE HCL 60 MG PO CPEP
60.0000 mg | ORAL_CAPSULE | Freq: Every day | ORAL | 3 refills | Status: DC
  Filled 2021-12-28: qty 30, 30d supply, fill #0

## 2021-12-28 MED ORDER — FERROUS SULFATE 325 (65 FE) MG PO TABS
ORAL_TABLET | ORAL | 2 refills | Status: DC
  Filled 2021-12-28 – 2022-04-05 (×2): qty 60, 30d supply, fill #0

## 2021-12-28 NOTE — Patient Instructions (Signed)

## 2021-12-28 NOTE — Progress Notes (Signed)
States that body aches real bad Stresses out Not taking medication due to no money. Headaches/ chest pains 1 month

## 2021-12-28 NOTE — Telephone Encounter (Signed)
As a request of newlin I met with pt. In regards to

## 2021-12-28 NOTE — Progress Notes (Signed)
Subjective:  Patient ID: Tina Moss, female    DOB: 13-Apr-1971  Age: 51 y.o. MRN: [id number]  CC: Hypertension   HPI Tina Moss is a 51 y.o. year old female with a history of seizures (seizure free for the last 5 years and is not currently on antiseizure medication), multiple sclerosis, L MCA CVA in 03/2017 .  Interval History:  She has been unable to obtain her medications due to cost hence her elevated blood pressure today. Complains of body aches, headaches, chest pains, stress for the last 1 month. She is currently not working and thinks she might have used her courtesy refills at the pharmacy.  She has family stressors, there has been a death treat towards her son who is incarcerated.  Her daughter is also undergoing some form of stress. It feels like someone is sticking a knife every now and then in her anterior chest wall and this has been going on for several weeks.  Her lower back hurts, throbs then returns and she has to hold unto the sink wile sitting on the toilet. Pain radiates down both legs and she sometimes has numbness.  Back pain is present all the time Past Medical History:  Diagnosis Date   Anemia    CVA (cerebral vascular accident) ([place])    2020   Epilepsy ([place])    Fibroids    H/O bacterial infection    H/O mumps    Hyperlipidemia    Hypertension    Seizures ([place])     Past Surgical History:  Procedure Laterality Date   CESAREAN SECTION     x2. at term   TEE WITHOUT CARDIOVERSION N/A 04/06/2018   Procedure: TRANSESOPHAGEAL ECHOCARDIOGRAM (TEE) BUBBLE STUDY;  Surgeon: Lelon Perla, MD;  Location: [facility] ENDOSCOPY;  Service: Cardiovascular;  Laterality: N/A;   TUBAL LIGATION     WISDOM TOOTH EXTRACTION      Family History  Problem Relation Age of Onset   Hypertension Mother    Hypertension Sister    Arthritis Sister        knees   Hypertension Brother    Deafness Brother    Speech disorder Brother        mute   Mental illness Brother         "he can just fly off"   Mental illness Daughter        ? bipolar   Scoliosis Son     Social History   Socioeconomic History   Marital status: Divorced    Spouse name: n/a   Number of children: 2   Years of education: 11th grade   Highest education level: 11th grade  Occupational History   Occupation: Housekeeper    Comment: [employer]  Tobacco Use   Smoking status: Some Days    Types: Cigars   Smokeless tobacco: Never   Tobacco comments:    "I think I just keep so much on my mind."  Vaping Use   Vaping Use: Never used  Substance and Sexual Activity   Alcohol use: Yes    Alcohol/week: 0.0 - 3.0 standard drinks of alcohol    Comment: social   Drug use: Not Currently    Types: Marijuana    Comment: occasionally   Sexual activity: Yes    Partners: Male    Birth control/protection: None  Other Topics Concern   Not on file  Social History Narrative   Lives with her daughter.   Son is currently incarcerated in [place].  She completed 11th grade, but was kicked out and never when back, as she had become pregnant and was raising her daughter.   Divorced x 2.   Right handed   Drinks caffeine   One story home   Social Determinants of Health   Financial Resource Strain: Not on file  Food Insecurity: Not on file  Transportation Needs: Unmet Transportation Needs (08/28/2019)   PRAPARE - Hydrologist (Medical): Yes    Lack of Transportation (Non-Medical): Yes  Physical Activity: Not on file  Stress: Not on file  Social Connections: Not on file    No Known Allergies  Outpatient Medications Prior to Visit  Medication Sig Dispense Refill   baclofen (LIORESAL) 10 MG tablet Take 1 tablet (10 mg total) by mouth 2 (two) times daily. 60 each 3   chlorthalidone (HYGROTON) 25 MG tablet Take 1 tablet (25 mg total) by mouth daily. 30 tablet 3   clopidogrel (PLAVIX) 75 MG tablet Take 1 tablet (75 mg total) by mouth daily. 30 tablet 2   clotrimazole  (LOTRIMIN) 1 % cream Apply 1 application. topically 2 (two) times daily. 60 g 1   donepezil (ARICEPT) 5 MG tablet Take 1 tablet (5 mg total) by mouth at bedtime. 30 tablet 11   hydrOXYzine (ATARAX) 10 MG tablet Take 1 tablet (10 mg total) by mouth 3 (three) times daily as needed. 60 tablet 1   lidocaine (LIDODERM) 5 % Place 1 patch onto the skin daily. Remove & Discard patch within 12 hours or as directed by MD 30 patch 1   lidocaine (XYLOCAINE) 2 % solution Swish and spit 15 ml Q 4 hrs as needed for mouth and throat pain 200 mL 0   losartan (COZAAR) 25 MG tablet Take 1 tablet (25 mg total) by mouth daily 90 tablet 2   meloxicam (MOBIC) 7.5 MG tablet Take 1 tablet (7.5 mg total) by mouth daily. 30 tablet 3   nystatin-triamcinolone ointment (MYCOLOG) Apply topically 2 (two) times daily. 30 g 0   triamcinolone (KENALOG) 0.1 % paste Apply small film to oral lesions/ulcers twice a day as needed. 5 g 0   atorvastatin (LIPITOR) 40 MG tablet Take 1 tablet (40 mg total) by mouth daily. at 6pm. NEEDS PASS 30 tablet 2   famotidine (PEPCID) 20 MG tablet Take 2 tablets (40 mg total) by mouth daily. (Patient not taking: Reported on 09/10/2021) 60 tablet 2   ferrous sulfate 325 (65 FE) MG tablet TAKE 1 TABLET (325 MG TOTAL) BY MOUTH 2 (TWO) TIMES DAILY WITH A MEAL. 60 tablet 2   metoprolol tartrate (LOPRESSOR) 50 MG tablet Take 1 tablet (50 mg total) by mouth 2 (two) times daily. 60 tablet 3   No facility-administered medications prior to visit.     ROS Review of Systems  Constitutional:  Negative for activity change, appetite change and fatigue.  HENT:  Negative for congestion, sinus pressure and sore throat.   Eyes:  Negative for visual disturbance.  Respiratory:  Negative for cough, chest tightness, shortness of breath and wheezing.   Cardiovascular:  Positive for chest pain. Negative for palpitations.  Gastrointestinal:  Negative for abdominal distention, abdominal pain and constipation.  Endocrine:  Negative for polydipsia.  Genitourinary:  Negative for dysuria and frequency.  Musculoskeletal:  Positive for back pain. Negative for arthralgias.  Skin:  Negative for rash.  Neurological:  Negative for tremors, light-headedness and numbness.  Hematological:  Does not bruise/bleed easily.  Psychiatric/Behavioral:  Positive  for dysphoric mood. Negative for agitation and behavioral problems.     Objective:  BP (!) 150/102   Pulse (!) 101   Temp 98 F (36.7 C) (Oral)   Ht '5\' 7"'$  (1.702 m)   Wt 192 lb 6.4 oz (87.3 kg)   SpO2 97%   BMI 30.13 kg/m      12/28/2021    9:09 AM 11/18/2021    2:45 PM 11/16/2021    1:05 PM  BP/Weight  Systolic BP 865 784   Diastolic BP 696 86   Wt. (Lbs) 192.4 193.6 194  BMI 30.13 kg/m2 30.32 kg/m2 30.38 kg/m2      Physical Exam Constitutional:      Appearance: She is well-developed.  Cardiovascular:     Rate and Rhythm: Tachycardia present.     Heart sounds: Normal heart sounds. No murmur heard. Pulmonary:     Effort: Pulmonary effort is normal.     Breath sounds: Normal breath sounds. No wheezing or rales.  Chest:     Chest wall: Tenderness present.  Abdominal:     General: Bowel sounds are normal. There is no distension.     Palpations: Abdomen is soft. There is no mass.     Tenderness: There is no abdominal tenderness.  Musculoskeletal:        General: Tenderness (TTP in lumbar region; negative straight leg raise bilaterally) present. Normal range of motion.     Right lower leg: No edema.     Left lower leg: No edema.  Neurological:     Mental Status: She is alert and oriented to person, place, and time.  Psychiatric:     Comments: Dysphoric mood        Latest Ref Rng & Units 09/10/2021    9:23 AM 07/28/2021   12:15 PM 04/02/2021    8:34 PM  CMP  Glucose 70 - 99 mg/dL 91  90  100   BUN 6 - 23 mg/dL '17  13  11   '$ Creatinine 0.40 - 1.20 mg/dL 0.65  0.71  0.50   Sodium 135 - 145 mEq/L 142  141  136   Potassium 3.5 - 5.1 mEq/L 3.7   4.2  3.7   Chloride 96 - 112 mEq/L 106  104  105   CO2 19 - 32 mEq/L '28  21  24   '$ Calcium 8.4 - 10.5 mg/dL 9.6  9.1  9.1   Total Protein 6.0 - 8.3 g/dL 7.3     Total Bilirubin 0.2 - 1.2 mg/dL 0.3     Alkaline Phos 39 - 117 U/L 123     AST 0 - 37 U/L 19     ALT 0 - 35 U/L 22       Lipid Panel     Component Value Date/Time   CHOL 216 (H) 02/09/2021 1203   TRIG 101 02/09/2021 1203   HDL 61 02/09/2021 1203   CHOLHDL 3.5 02/09/2021 1203   CHOLHDL 2.9 04/05/2018 0535   VLDL 13 04/05/2018 0535   LDLCALC 137 (H) 02/09/2021 1203    CBC    Component Value Date/Time   WBC 4.7 10/16/2021 0843   WBC 3.2 (L) 09/10/2021 0923   RBC 4.35 10/16/2021 0843   RBC 4.53 09/10/2021 0923   HGB 13.1 10/16/2021 0843   HCT 39.6 10/16/2021 0843   PLT 247 10/16/2021 0843   MCV 91 10/16/2021 0843   MCH 30.1 10/16/2021 0843   MCH 31.2 04/02/2021 2034   MCHC 33.1 10/16/2021  0843   MCHC 32.7 09/10/2021 0923   RDW 13.2 10/16/2021 0843   LYMPHSABS 1.4 10/16/2021 0843   MONOABS 0.4 04/02/2021 2034   EOSABS 0.0 10/16/2021 0843   BASOSABS 0.0 10/16/2021 0843    Lab Results  Component Value Date   HGBA1C 6.0 (H) 09/09/2021       12/28/2021    9:12 AM 10/15/2021   10:29 AM 10/06/2021    9:44 AM 09/09/2021   10:23 AM 09/02/2021    2:39 PM  Depression screen PHQ 2/9  Decreased Interest '3 3 3 3 3  '$ Down, Depressed, Hopeless '3 3 2 3 2  '$ PHQ - 2 Score '6 6 5 6 5  '$ Altered sleeping '3 2 3 3 3  '$ Tired, decreased energy '3 3 3 3 3  '$ Change in appetite '3 2 2 2 2  '$ Feeling bad or failure about yourself  '3 3 2 2 2  '$ Trouble concentrating '3 2 3 2 3  '$ Moving slowly or fidgety/restless '3 2 2 3 3  '$ Suicidal thoughts 0 0 0 0 0  PHQ-9 Score '24 20 20 21 21    '$ Assessment & Plan:  1. Other chest pain EKG findings reveal T wave inversion in multiple leads similar to previous EKG, prolonged QT She will benefit from a stress test Examination findings reveal reproducible chest pain suspicious for costochondritis as well -  Ambulatory referral to Cardiology  2. Chronic bilateral low back pain with bilateral sciatica Previous x-ray of the lumbar spine in 2021 was unremarkable - DG Lumbar Spine Complete; Future - DULoxetine (CYMBALTA) 60 MG capsule; Take 1 capsule (60 mg total) by mouth daily.  Dispense: 30 capsule; Refill: 3 - Ambulatory referral to Physical Therapy  3. Stress Her PHQ-9 is elevated at 24 Cymbalta initiated for back pain will also be beneficial LCSW called in for psychotherapy - DULoxetine (CYMBALTA) 60 MG capsule; Take 1 capsule (60 mg total) by mouth daily.  Dispense: 30 capsule; Refill: 3  4. Primary hypertension Uncontrolled due to medication nonadherence Advised to speak with the pharmacy regarding available options given her inability to afford her medication - Basic Metabolic Panel    Meds ordered this encounter  Medications   DULoxetine (CYMBALTA) 60 MG capsule    Sig: Take 1 capsule (60 mg total) by mouth daily.    Dispense:  30 capsule    Refill:  3    Follow-up: Return in about 3 months (around 03/30/2022) for Chronic medical conditions.       Charlott Rakes, MD, FAAFP. [facility] and [place], [place]   12/28/2021, 10:10 AM

## 2021-12-29 LAB — BASIC METABOLIC PANEL
BUN/Creatinine Ratio: 17 (ref 9–23)
BUN: 12 mg/dL (ref 6–24)
CO2: 23 mmol/L (ref 20–29)
Calcium: 9.5 mg/dL (ref 8.7–10.2)
Chloride: 103 mmol/L (ref 96–106)
Creatinine, Ser: 0.72 mg/dL (ref 0.57–1.00)
Glucose: 96 mg/dL (ref 70–99)
Potassium: 3.8 mmol/L (ref 3.5–5.2)
Sodium: 139 mmol/L (ref 134–144)
eGFR: 101 mL/min/{1.73_m2} (ref >59)

## 2021-12-29 NOTE — Telephone Encounter (Signed)
As requested by Dr. Newlin, LCSWA met with Tina Moss when she was in the clinic, in regards to her elevated PHQ-9 assessment. Tina Moss. recently talked to her son who has been incarcerated for a while. Tina Moss. is also very overwhelmed with helping her family and is showing symptoms of depression. Tina Moss. spoke about lack of time for self-care. Tina Moss was given coping skills and plans to schedule an initial visit with LCSWA. 

## 2022-01-01 ENCOUNTER — Other Ambulatory Visit: Payer: Self-pay

## 2022-01-04 ENCOUNTER — Telehealth: Payer: Self-pay

## 2022-01-04 ENCOUNTER — Ambulatory Visit: Payer: Commercial Managed Care - HMO | Attending: Family Medicine

## 2022-01-04 DIAGNOSIS — M5442 Lumbago with sciatica, left side: Secondary | ICD-10-CM | POA: Diagnosis not present

## 2022-01-04 DIAGNOSIS — G8929 Other chronic pain: Secondary | ICD-10-CM | POA: Diagnosis not present

## 2022-01-04 DIAGNOSIS — M6281 Muscle weakness (generalized): Secondary | ICD-10-CM | POA: Diagnosis present

## 2022-01-04 DIAGNOSIS — R293 Abnormal posture: Secondary | ICD-10-CM | POA: Diagnosis not present

## 2022-01-04 DIAGNOSIS — M5441 Lumbago with sciatica, right side: Secondary | ICD-10-CM | POA: Diagnosis not present

## 2022-01-04 NOTE — Telephone Encounter (Signed)
Dr. Margarita Rana, [place] was evaluated by PT on 01/04/22 for low back pain.  During my screening questions, she reported increased instances of bladder incontinence since this exacerbation of LBP. She cannot recall if she mentioned it to you at her last visit- wanted you to be aware.   Thank you, Estevan Ryder, PT, DPT, [facility] [address] [place], [place]  [address] Phone:  [phone] Fax:  [phone]

## 2022-01-04 NOTE — Therapy (Signed)
OUTPATIENT PHYSICAL THERAPY THORACOLUMBAR EVALUATION   Patient Name: CISSY GALBREATH MRN: [id number] DOB:1970/07/26, 51 y.o., female Today's Date: 01/04/2022   PT End of Session - 01/04/22 1217     Visit Number 1    Number of Visits 5    Date for PT Re-Evaluation 02/05/22    Authorization Type [employer]    PT Start Time 1230    PT Stop Time 1315    PT Time Calculation (min) 45 min    Activity Tolerance Patient limited by pain    Behavior During Therapy Anxious;Restless             Past Medical History:  Diagnosis Date   Anemia    CVA (cerebral vascular accident) ([place])    2020   Epilepsy ([place])    Fibroids    H/O bacterial infection    H/O mumps    Hyperlipidemia    Hypertension    Seizures ([place])    Past Surgical History:  Procedure Laterality Date   CESAREAN SECTION     x2. at term   TEE WITHOUT CARDIOVERSION N/A 04/06/2018   Procedure: TRANSESOPHAGEAL ECHOCARDIOGRAM (TEE) BUBBLE STUDY;  Surgeon: Lelon Perla, MD;  Location: [facility] ENDOSCOPY;  Service: Cardiovascular;  Laterality: N/A;   TUBAL LIGATION     WISDOM TOOTH EXTRACTION     Patient Active Problem List   Diagnosis Date Noted   Prediabetes 10/06/2021   Memory difficulties 09/10/2021   History of epilepsy 09/10/2021   Left middle cerebral artery stroke ([place]) 07/17/2020   Vitamin D deficiency 07/17/2020   Anxiety state 07/17/2020   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/2020   Hyperlipidemia LDL goal <100 09/18/2019   Acute pain of right knee 09/17/2019   Hip pain, acute, right 09/17/2019   Abnormal uterine bleeding (AUB) 02/13/2019   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   Tobacco use 04/04/2018   Heart palpitations 12/14/2016   LVH (left ventricular hypertrophy) 12/14/2016   Depression 11/15/2016   Essential hypertension 11/15/2016   Fibroids 01/18/2012   Menorrhagia 01/18/2012   Seizure disorder ([place]) 01/18/2012   Anemia 01/18/2012    PCP: Charlott Rakes, MD  REFERRING PROVIDER: Charlott Rakes, MD  REFERRING DIAG: M54.42,M54.41,G89.29 (ICD-10-CM) - Chronic bilateral low back pain with bilateral sciatica   Rationale for Evaluation and Treatment Rehabilitation  THERAPY DIAG:  No diagnosis found.  ONSET DATE: 12/28/2021 referral  SUBJECTIVE:                                                                                                                                                                                           SUBJECTIVE STATEMENT: "I've been  having sharp pains in my back." Patient reports back pain started "quite a few years ago." Reports pain started to get worse ~3-4 months ago. Reports that pain radiates to her chest. Patient stating that sometimes this pain feels like pressure in her chest with intermittent SOB. After further questioning, patient stating that chest pain that wraps under bra line and breast is unrelated to back pain. She reports that 5-6 months ago she lifted a heavy board at [employer] (where she worked at the time) and that exacerbated her pain.    PERTINENT HISTORY:  seizures (seizure free for the last 5 years and is not currently on antiseizure medication), multiple sclerosis, L MCA CVA in 03/2017 .  PAIN:  Are you having pain? Yes: NPRS scale: 8/10 Pain location: primarily in low back that travels up to beck and down R posterior LE  Pain description: "Like I'm being stabbed"  Aggravating factors: "walking a lot or sitting still a lot"  Relieving factors: "sometimes when I'm asleep"   PRECAUTIONS: Fall and Other: h/o seizures  WEIGHT BEARING RESTRICTIONS No  FALLS:  Has patient fallen in last 6 months? No  LIVING ENVIRONMENT: Lives with: lives with their family Lives in: House/apartment Stairs: Yes: External: 2 steps; none Has following equipment at home: Grab bars  OCCUPATION: currently unemployed  PLOF: Independent  PATIENT GOALS "I want to get my muscle back right"    OBJECTIVE:   DIAGNOSTIC FINDINGS:  Lumbar  xray ordered  SCREENING FOR RED FLAGS: Bowel or bladder incontinence: Yes: patient reports new/increased bladder incontinence  Spinal tumors: No Cauda equina syndrome: No Compression fracture: No Abdominal aneurysm: No  COGNITION:  Overall cognitive status: No family/caregiver present to determine baseline cognitive functioning     SENSATION: Denies N/T in extremities, but states that her hands "will just make shapes" sometimes, not associated with neck pain- likely muscle spasms?   POSTURE: rounded shoulders, forward head, increased thoracic kyphosis, posterior pelvic tilt, flexed trunk , and weight shift right  PALPATION: No step off noted Significant increase in tone noted to R paraspinals   LUMBAR ROM:   Active  A/PROM  eval  Flexion guarded  Extension guarded  Right lateral flexion guarded  Left lateral flexion guarded  Right rotation guarded  Left rotation guarded   (Blank rows = not tested)  LOWER EXTREMITY MMT:    MMT Right eval Left eval  Hip flexion 3 3  Hip extension    Hip abduction 4 4  Hip adduction 4 4  Hip internal rotation    Hip external rotation    Knee flexion 4 4  Knee extension 3 3  Ankle dorsiflexion 5 5  Ankle plantarflexion 5 5  Ankle inversion    Ankle eversion     (Blank rows = not tested)  LUMBAR SPECIAL TESTS:  Prone instability test: Negative, Straight leg raise test: Negative, Slump test: Negative, Single leg stance test: Negative, and SI Compression/distraction test: Negative  GAIT: Distance walked: clinic Assistive device utilized: None Level of assistance: Complete Independence   TODAY'S TREATMENT  N/a   PATIENT EDUCATION:  Education details: PT POC, exam results Person educated: Patient Education method: Customer service manager Education comprehension: verbalized understanding and needs further education   HOME EXERCISE PROGRAM: To be provided   ASSESSMENT:  CLINICAL IMPRESSION: Patient is a 51 y.o.  female who was seen today for physical therapy evaluation and treatment for low back pain of unknown origin. She presents with negative exam findings, except for increased  paraspinal tone. She also reports increased instances of bladder incontinence, but patient unable to clarify whether this frequency increased since exacerbation of back pain or if it's likely unrelated. MD to be made aware by PT. Patient with severely guarded posture. She will benefit from skilled PT services in order to improve her body mechanics, decreased paraspinal tone and provide appropriate home exercises.    OBJECTIVE IMPAIRMENTS Abnormal gait, decreased activity tolerance, decreased balance, decreased cognition, decreased coordination, decreased endurance, decreased knowledge of condition, decreased mobility, decreased ROM, decreased strength, hypomobility, increased fascial restrictions, increased muscle spasms, impaired flexibility, improper body mechanics, and pain.   ACTIVITY LIMITATIONS carrying, lifting, bending, sitting, standing, squatting, sleeping, stairs, transfers, bed mobility, continence, locomotion level, and caring for others  PARTICIPATION LIMITATIONS: cleaning, interpersonal relationship, driving, shopping, community activity, occupation, and yard work  PERSONAL FACTORS Age, Behavior pattern, Education, Fitness, Past/current experiences, Social background, Time since onset of injury/illness/exacerbation, and 3+ comorbidities: L MCA, MS, seizures  are also affecting patient's functional outcome.   REHAB POTENTIAL: Fair time since onset  CLINICAL DECISION MAKING: Unstable/unpredictable  EVALUATION COMPLEXITY: High   GOALS: Goals reviewed with patient? Yes  SHORT TERM GOALS: = LTG based on POC length   LONG TERM GOALS: Target date: 02/01/2022  Pt will be independent with final HEP for improved body mechanics and strength   Baseline: to be provided Goal status: INITIAL  2.  Patient will complete  Oswestry and LTG to be updated as appropriate Baseline: to be completed Goal status: INITIAL  3.  Patient will complete 5xSTS and LTG to be updated as appropriate  Baseline: to be assessed Goal status: INITIAL  PLAN: PT FREQUENCY: 1x/week  PT DURATION: 4 weeks  PLANNED INTERVENTIONS: Therapeutic exercises, Therapeutic activity, Neuromuscular re-education, Balance training, Gait training, Patient/Family education, Self Care, Joint mobilization, Joint manipulation, Stair training, Vestibular training, Canalith repositioning, Visual/preceptual remediation/compensation, DME instructions, Aquatic Therapy, Dry Needling, Electrical stimulation, Spinal manipulation, Spinal mobilization, Cryotherapy, Moist heat, Taping, Manual therapy, and Re-evaluation.  PLAN FOR NEXT SESSION: provide HEP, 5xSTS, manual to LB?   Debbora Dus, PT Debbora Dus, PT, DPT, CBIS  01/04/2022, 2:38 PM

## 2022-01-04 NOTE — Telephone Encounter (Signed)
Thanks for making me aware. Please have her schedule an appointment with me.

## 2022-01-11 ENCOUNTER — Ambulatory Visit: Payer: Commercial Managed Care - HMO

## 2022-01-20 ENCOUNTER — Encounter: Payer: Self-pay | Admitting: Physical Therapy

## 2022-01-20 ENCOUNTER — Ambulatory Visit: Payer: Commercial Managed Care - HMO | Attending: Family Medicine | Admitting: Physical Therapy

## 2022-01-20 DIAGNOSIS — R293 Abnormal posture: Secondary | ICD-10-CM | POA: Diagnosis present

## 2022-01-20 DIAGNOSIS — M6281 Muscle weakness (generalized): Secondary | ICD-10-CM | POA: Insufficient documentation

## 2022-01-20 NOTE — Therapy (Addendum)
OUTPATIENT PHYSICAL THERAPY THORACOLUMBAR TREATMENT   Patient Name: Tina Moss MRN: [id number] DOB:[birth year], 51 y.o., female Today's Date: 01/20/2022    01/20/22 1107  PT Visits / Re-Eval  Visit Number 2  Number of Visits 5  Date for PT Re-Evaluation 02/05/22  Authorization  Authorization Type [employer]  PT Time Calculation  PT Start Time 1104  PT Stop Time 1145  PT Time Calculation (min) 41 min  PT - End of Session  Activity Tolerance Patient limited by pain  Behavior During Therapy Anxious;Restless     Past Medical History:  Diagnosis Date   Anemia    CVA (cerebral vascular accident) ([place])    2020   Epilepsy ([place])    Fibroids    H/O bacterial infection    H/O mumps    Hyperlipidemia    Hypertension    Seizures ([place])    Past Surgical History:  Procedure Laterality Date   CESAREAN SECTION     x2. 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   Vitamin D deficiency 07/17/2020   Anxiety state 07/17/2020   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/2020   Hyperlipidemia LDL goal <100 09/18/2019   Acute pain of right knee 09/17/2019   Hip pain, acute, right 09/17/2019   Abnormal uterine bleeding (AUB) 02/13/2019   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   Tobacco use 04/04/2018   Heart palpitations 12/14/2016   LVH (left ventricular hypertrophy) 12/14/2016   Depression 11/15/2016   Essential hypertension 11/15/2016   Fibroids 01/18/2012   Menorrhagia 01/18/2012   Seizure disorder ([place]) 01/18/2012   Anemia 01/18/2012    PCP: Charlott Rakes,  MD  REFERRING PROVIDER: Charlott Rakes, MD  REFERRING DIAG: M54.42,M54.41,G89.29 (ICD-10-CM) - Chronic bilateral low back pain with bilateral sciatica   Rationale for Evaluation and Treatment Rehabilitation  THERAPY DIAG:  Abnormal posture  Muscle weakness (generalized)  ONSET DATE: 12/28/2021 referral   PERTINENT HISTORY: seizures (seizure free for the last 5 years and is not currently on antiseizure medication), multiple sclerosis, L MCA CVA in 03/2017.   PRECAUTIONS: Fall and Other: h/o seizures   PATIENT GOALS: "I want to get my muscle back right"     SUBJECTIVE: No new complains. Still having urinary urgency/incontinence. Pt informed of MD recommendation to call and set up appt with him.                                                                                                                                                                                        Marland Kitchen  PAIN:  Are you having pain? Yes: NPRS scale: 7-8/10 Pain location: primarily in low back that travels up to beck and down R posterior LE  Pain description: "Like I'm being stabbed", "pinching"   Aggravating factors: prolonged sitting or standing Relieving factors: medication some, resting in certain positions     TODAY'S TREATMENT:  MANUAL THERAPY: Pt in prone on mat table with cervical massage pillow and bolster under feet at ankles. Pt noted to be very sensitive to touch/palpation along entire right side of spine from scapula toward gluts. Less tender/sensitive on left side. Began with myofacial ball with light pressure on left side working toward right side as tolerated for decreased tissue tightness and improved mobility. Light soft tissue mobs to scapular muscles and right latissimus.  +   SELF CARE: Dicussed various sleeping positions to decrease pain. Pt tends to sleep on side or back. Discussed use of pillows under knees when on back and between knees with side lying. PTA demo'd how use of  pillows assist with improved pelvic positioning in each position for decreased pull on spine. Pt agreed to try this to see if it helps.  STRENGTHENING  Issued ex's to HEP for core strengthening and gentle stretching. Pt with increased pain/discomfort with performance that returned to baseline when done. Pt advised if ex's increased pain to stop them at home. Refer to HEP section below for full details.     PATIENT EDUCATION:  Education details: sleeping positioning, intial HEP for core stabilization/strengthening Person educated: Patient Education method: Customer service manager Education comprehension: verbalized understanding and needs further education   HOME EXERCISE PROGRAM: Access Code: [email or web address] URL: [email or web address] Date: 01/20/2022 Prepared by: Willow Ora  Exercises - Supine Transversus Abdominis Bracing - Hands on Ground  - 1 x daily - 5 x weekly - 1 sets - 10 reps - 5 seconds hold - Supine Pelvic Rocking  - 1 x daily - 5 x weekly - 1 sets - 10 reps - Hooklying Single Knee to Chest Stretch with Towel  - 1 x daily - 5 x weekly - 1 sets - 1-3 reps - 15-30 seconds hold - Hooklying Lumbar Rotation  - 1 x daily - 5 x weekly - 1 sets - 5-10 reps - 3-5 seconds hold  GOALS: Goals reviewed with patient? Yes  SHORT TERM GOALS: = LTG based on POC length   LONG TERM GOALS: Target date: 02/01/2022  Pt will be independent with final HEP for improved body mechanics and strength   Baseline: to be provided Goal status: INITIAL  2.  Patient will complete Oswestry and LTG to be updated as appropriate Baseline: to be completed Goal status: INITIAL  3.  Patient will complete 5xSTS and LTG to be updated as appropriate Baseline: to be assessed Goal status: INITIAL   ASSESSMENT:  CLINICAL IMPRESSION: Today's skilled session focused on methods for decreasing pain and improving strength/flexibility. Pt with very sensitive/painful response to any  palpation/touch to right side of back. Improved minimally with manual therapy. Did discuss sleeping positions to assist with decreased pain. Also issued ex's HEP this session with pt reporting some increase in pain with some ex's that did resolve back to baseline with rest between ex's. Pt should benefit from continued PT to try and progress toward unmet goals.     OBJECTIVE IMPAIRMENTS Abnormal gait, decreased activity tolerance, decreased balance, decreased cognition, decreased coordination, decreased endurance, decreased knowledge of condition, decreased mobility, decreased ROM, decreased strength, hypomobility, increased fascial restrictions, increased muscle spasms,  impaired flexibility, improper body mechanics, and pain.   ACTIVITY LIMITATIONS carrying, lifting, bending, sitting, standing, squatting, sleeping, stairs, transfers, bed mobility, continence, locomotion level, and caring for others  PARTICIPATION LIMITATIONS: cleaning, interpersonal relationship, driving, shopping, community activity, occupation, and yard work  PERSONAL FACTORS Age, Behavior pattern, Education, Fitness, Past/current experiences, Social background, Time since onset of injury/illness/exacerbation, and 3+ comorbidities: L MCA, MS, seizures  are also affecting patient's functional outcome.   REHAB POTENTIAL: Fair time since onset  CLINICAL DECISION MAKING: Unstable/unpredictable  EVALUATION COMPLEXITY: High    PLAN: PT FREQUENCY: 1x/week  PT DURATION: 4 weeks  PLANNED INTERVENTIONS: Therapeutic exercises, Therapeutic activity, Neuromuscular re-education, Balance training, Gait training, Patient/Family education, Self Care, Joint mobilization, Joint manipulation, Stair training, Vestibular training, Canalith repositioning, Visual/preceptual remediation/compensation, DME instructions, Aquatic Therapy, Dry Needling, Electrical stimulation, Spinal manipulation, Spinal mobilization, Cryotherapy, Moist heat, Taping,  Manual therapy, and Re-evaluation.  PLAN FOR NEXT SESSION:  perform 5xSTS, how did pt feel after last session? How is HEP going? Continue with gentle strengthening of core/LE's (? Use of moist heat concurrent with NuStep/Scifit and/or ex's), possibly try estim with head to decrease muscle tightness and sensitivity    Willow Ora, PTA, [facility] [address] [place], [place] [address] [phone] 01/20/22, 11:08 AM

## 2022-01-21 NOTE — Progress Notes (Signed)
   01/20/22 1107  PT Visits / Re-Eval  Visit Number 2  Number of Visits 5  Date for PT Re-Evaluation 02/05/22  Authorization  Authorization Type [employer]  PT Time Calculation  PT Start Time 1104  PT Stop Time 1145  PT Time Calculation (min) 41 min  PT - End of Session  Activity Tolerance Patient limited by pain  Behavior During Therapy Anxious;Restless

## 2022-01-25 ENCOUNTER — Ambulatory Visit: Payer: Managed Care, Other (non HMO) | Admitting: Internal Medicine

## 2022-01-25 ENCOUNTER — Encounter: Payer: Self-pay | Admitting: Interventional Cardiology

## 2022-01-25 ENCOUNTER — Ambulatory Visit: Payer: Commercial Managed Care - HMO | Admitting: Interventional Cardiology

## 2022-01-25 VITALS — BP 130/86 | HR 92 | Ht 67.5 in | Wt 193.6 lb

## 2022-01-25 DIAGNOSIS — R7303 Prediabetes: Secondary | ICD-10-CM

## 2022-01-25 DIAGNOSIS — I10 Essential (primary) hypertension: Secondary | ICD-10-CM | POA: Diagnosis not present

## 2022-01-25 DIAGNOSIS — E782 Mixed hyperlipidemia: Secondary | ICD-10-CM | POA: Diagnosis not present

## 2022-01-25 DIAGNOSIS — R072 Precordial pain: Secondary | ICD-10-CM

## 2022-01-25 NOTE — Progress Notes (Signed)
Cardiology Office Note   Date:  01/25/2022   ID:  Tina Moss, DOB 1970-10-04, MRN [id number]  PCP:  Charlott Rakes, MD    No chief complaint on file.  Chest pain  Wt Readings from Last 3 Encounters:  01/25/22 193 lb 9.6 oz (87.8 kg)  12/28/21 192 lb 6.4 oz (87.3 kg)  11/18/21 193 lb 9.6 oz (87.8 kg)       History of Present Illness: Tina Moss is a 51 y.o. female who is being seen today for the evaluation of chest pain at the request of Charlott Rakes, MD.  Prior records show: " with a history of seizures (seizure free for the last 5 years and is not currently on antiseizure medication), multiple sclerosis, L MCA CVA in 03/2017-affected her speech, but she made a good recovery.   She has been unable to obtain her medications due to cost hence her elevated blood pressure today. Complains of body aches, headaches, chest pains, stress for the last 1 month."  She describes a sharp chest pain that occurs more than once a week.  Lasts 5 minutes at a time when she holds/pushes on it.  Records from PMD show: "She has family stressors, there has been a death treat towards her son who is incarcerated.  Her daughter is also undergoing some form of stress. It feels like someone is sticking a knife every now and then in her anterior chest wall and this has been going on for several weeks.  Her lower back hurts, throbs then returns and she has to hold unto the sink wile sitting on the toilet. Pain radiates down both legs and she sometimes has numbness.  Back pain is present all the time"   Denies :  Dizziness. Leg edema. Nitroglycerin use. Orthopnea. Palpitations. Paroxysmal nocturnal dyspnea. Shortness of breath. Syncope.    Chest pain not necessarily affiliated with exertion.  No associated nausea or diaphoresis.  She is very grateful for the recovery she has made after her stroke.   Past Medical History:  Diagnosis Date   Anemia    CVA (cerebral vascular accident) ([place])     2020   Epilepsy ([place])    Fibroids    H/O bacterial infection    H/O mumps    Hyperlipidemia    Hypertension    Seizures ([place])     Past Surgical History:  Procedure Laterality Date   CESAREAN SECTION     x2. at term   TEE WITHOUT CARDIOVERSION N/A 04/06/2018   Procedure: TRANSESOPHAGEAL ECHOCARDIOGRAM (TEE) BUBBLE STUDY;  Surgeon: Lelon Perla, MD;  Location: [facility] ENDOSCOPY;  Service: Cardiovascular;  Laterality: N/A;   TUBAL LIGATION     WISDOM TOOTH EXTRACTION       Current Outpatient Medications  Medication Sig Dispense Refill   baclofen (LIORESAL) 10 MG tablet Take 1 tablet (10 mg total) by mouth 2 (two) times daily. 60 each 3   chlorthalidone (HYGROTON) 25 MG tablet Take 1 tablet (25 mg total) by mouth daily. 30 tablet 3   clopidogrel (PLAVIX) 75 MG tablet Take 1 tablet (75 mg total) by mouth daily. 30 tablet 2   donepezil (ARICEPT) 5 MG tablet Take 1 tablet (5 mg total) by mouth at bedtime. 30 tablet 11   DULoxetine (CYMBALTA) 60 MG capsule Take 1 capsule (60 mg total) by mouth daily. 30 capsule 3   ferrous sulfate (FEROSUL) 325 (65 FE) MG tablet TAKE 1 TABLET (325 MG TOTAL) BY MOUTH 2 (TWO)  TIMES DAILY WITH A MEAL. 60 tablet 2   hydrochlorothiazide (MICROZIDE) 12.5 MG capsule      hydrOXYzine (ATARAX) 10 MG tablet Take 1 tablet (10 mg total) by mouth 3 (three) times daily as needed. 60 tablet 1   losartan (COZAAR) 25 MG tablet Take 1 tablet (25 mg total) by mouth daily 90 tablet 2   meloxicam (MOBIC) 7.5 MG tablet Take 1 tablet (7.5 mg total) by mouth daily. 30 tablet 3   metoprolol tartrate (LOPRESSOR) 50 MG tablet Take 1 tablet (50 mg total) by mouth 2 (two) times daily. 60 tablet 3   atorvastatin (LIPITOR) 40 MG tablet Take 1 tablet (40 mg total) by mouth daily. at 6pm. NEEDS PASS 30 tablet 2   clopidogrel (PLAVIX) 75 MG tablet Take 1 tablet (75 mg total) by mouth daily (Patient not taking: Reported on 01/25/2022) 90 tablet 0   clotrimazole (LOTRIMIN) 1 % cream Apply  1 application. topically 2 (two) times daily. (Patient not taking: Reported on 01/25/2022) 60 g 1   famotidine (PEPCID) 20 MG tablet Take 2 tablets (40 mg total) by mouth daily. (Patient not taking: Reported on 01/25/2022) 60 tablet 2   lidocaine (LIDODERM) 5 % Place 1 patch onto the skin daily. Remove & Discard patch within 12 hours or as directed by MD (Patient not taking: Reported on 01/25/2022) 30 patch 1   lidocaine (XYLOCAINE) 2 % solution Swish and spit 15 ml Q 4 hrs as needed for mouth and throat pain (Patient not taking: Reported on 01/25/2022) 200 mL 0   nystatin-triamcinolone ointment (MYCOLOG) Apply topically 2 (two) times daily. (Patient not taking: Reported on 01/25/2022) 30 g 0   triamcinolone (KENALOG) 0.1 % paste Apply small film to oral lesions/ulcers twice a day as needed. (Patient not taking: Reported on 01/25/2022) 5 g 0   No current facility-administered medications for this visit.    Allergies:   Patient has no known allergies.    Social History:  The patient  reports that she has been smoking cigars. She has never used smokeless tobacco. She reports current alcohol use. She reports that she does not currently use drugs after having used the following drugs: Marijuana.   Family History:  The patient's family history includes Arthritis in her sister; Deafness in her brother; Hypertension in her brother, mother, and sister; Mental illness in her brother and daughter; Scoliosis in her son; Speech disorder in her brother.    ROS:  Please see the history of present illness.   Otherwise, review of systems are positive for pain as noted above.   All other systems are reviewed and negative.    PHYSICAL EXAM: VS:  BP 130/86   Pulse 92   Ht 5' 7.5" (1.715 m)   Wt 193 lb 9.6 oz (87.8 kg)   SpO2 96%   BMI 29.87 kg/m  , BMI Body mass index is 29.87 kg/m. GEN: Well nourished, well developed, in no acute distress HEENT: normal Neck: no JVD, carotid bruits, or masses Cardiac: RRR;  no murmurs, rubs, or gallops,no edema  Respiratory:  clear to auscultation bilaterally, normal work of breathing GI: soft, nontender, nondistended, + BS MS: no deformity or atrophy Skin: warm and dry, no rash Neuro:  Strength and sensation are intact Psych: euthymic mood, full affect   EKG:   The ekg ordered today demonstrates NSR, diffuse T wave inversion, borderline LVH   Recent Labs: 09/09/2021: TSH 1.040 09/10/2021: ALT 22 10/16/2021: Hemoglobin 13.1; Platelets 247 12/28/2021: BUN 12;  Creatinine, Ser 0.72; Potassium 3.8; Sodium 139   Lipid Panel    Component Value Date/Time   CHOL 216 (H) 02/09/2021 1203   TRIG 101 02/09/2021 1203   HDL 61 02/09/2021 1203   CHOLHDL 3.5 02/09/2021 1203   CHOLHDL 2.9 04/05/2018 0535   VLDL 13 04/05/2018 0535   LDLCALC 137 (H) 02/09/2021 1203     Other studies Reviewed: Additional studies/ records that were reviewed today with results demonstrating: Labs reviewed.  I reviewed her MRI from the time of her stroke-no atherosclerosis reported   ASSESSMENT AND PLAN:  Chest pain: Several atypical features.  Several risk factors for heart disease.  Plan for coronary CTA.  We will have her take 100 mg of metoprolol prior to the test. HTN:  The current medical regimen is effective;  continue present plan and medications.  Low-salt diet. PreDM: , Whole food plant Based diet.High-fiber diet . avoid processed foods . Hyperlipidemia: LDL 137 in August 2022.  Given her prior CVA, her LDL target is lower.  Will see what her coronary calcium score is and determine dose of potential statin.  She would like to hold off on taking additional medicines but I think she would be willing to take cholesterol-lowering medicine if this lowered her risk going forward.  Current medicines are reviewed at length with the patient today.  The patient concerns regarding her medicines were addressed.  The following changes have been made:  No change  Labs/ tests ordered today  include: CTA coronaries No orders of the defined types were placed in this encounter.   Recommend 150 minutes/week of aerobic exercise Low fat, low carb, high fiber diet recommended  Disposition:   FU in based on CT result   Signed, Larae Grooms, MD  01/25/2022 4:13 PM    [facility] [place], [place], [place]  [address] Phone: [phone]; Fax: [phone]

## 2022-01-25 NOTE — Patient Instructions (Signed)
Medication Instructions:  Your physician recommends that you continue on your current medications as directed. Please refer to the Current Medication list given to you today.  *If you need a refill on your cardiac medications before your next appointment, please call your pharmacy*   Lab Work: TODAY: BMET If you have labs (blood work) drawn today and your tests are completely normal, you will receive your results only by: [place] (if you have MyChart) OR A paper copy in the mail If you have any lab test that is abnormal or we need to change your treatment, we will call you to review the results.   Testing/Procedures:   Your cardiac CT will be scheduled at one of the below locations:   [facility] [address] [place], [place] [address] [phone]  If scheduled at [facility], please arrive at the [facility] and Children's Entrance (Entrance C2) of [facility] 30 minutes prior to test start time. You can use the FREE valet parking offered at entrance C (encouraged to control the heart rate for the test)  Proceed to the [facility] Radiology Department (first floor) to check-in and test prep.  All radiology patients and guests should use entrance C2 at [facility], accessed from [facility], even though the hospital's physical address listed is [address]    If scheduled at [facility], please arrive 15 mins early for check-in and test prep.  Please follow these instructions carefully (unless otherwise directed):  On the Night Before the Test: Be sure to Drink plenty of water. Do not consume any caffeinated/decaffeinated beverages or chocolate 12 hours prior to your test. Do not take any antihistamines 12 hours prior to your test.  On the Day of the Test: Drink plenty of water until 1 hour prior to the test. Do not eat any food 4 hours prior to the test. You may take your regular  medications prior to the test.  Take metoprolol (Lopressor) 100 MG two hours prior to test. HOLD Furosemide/Hydrochlorothiazide morning of the test. FEMALES- please wear underwire-free bra if available, avoid dresses & tight clothing      After the Test: Drink plenty of water. After receiving IV contrast, you may experience a mild flushed feeling. This is normal. On occasion, you may experience a mild rash up to 24 hours after the test. This is not dangerous. If this occurs, you can take Benadryl 25 mg and increase your fluid intake. If you experience trouble breathing, this can be serious. If it is severe call 911 IMMEDIATELY. If it is mild, please call our office. If you take any of these medications: Glipizide/Metformin, Avandament, Glucavance, please do not take 48 hours after completing test unless otherwise instructed.  We will call to schedule your test 2-4 weeks out understanding that some insurance companies will need an authorization prior to the service being performed.   For non-scheduling related questions, please contact the cardiac imaging nurse navigator should you have any questions/concerns: Marchia Bond, Cardiac Imaging Nurse Navigator Gordy Clement, Cardiac Imaging Nurse Navigator [facility] Direct Office Dial: [phone]   For scheduling needs, including cancellations and rescheduling, please call [place], [phone].    Follow-Up: At [facility], you and your health needs are our priority.  As part of our continuing mission to provide you with exceptional heart care, we have created designated Provider Care Teams.  These Care Teams include your primary Cardiologist (physician) and  Advanced Practice Providers (APPs -  Physician Assistants and Nurse Practitioners) who all work together to provide you with the care you need, when you need it.  We recommend signing up for the patient portal called "MyChart".  Sign up information is  provided on this After Visit Summary.  MyChart is used to connect with patients for Virtual Visits (Telemedicine).  Patients are able to view lab/test results, encounter notes, upcoming appointments, etc.  Non-urgent messages can be sent to your provider as well.   To learn more about what you can do with MyChart, go to [email or web address].    Your next appointment:   AS NEEDED  The format for your next appointment:   In Person  Provider:   DR. VARANASI  Important Information About Sugar

## 2022-01-26 LAB — BASIC METABOLIC PANEL
BUN/Creatinine Ratio: 17 (ref 9–23)
BUN: 13 mg/dL (ref 6–24)
CO2: 21 mmol/L (ref 20–29)
Calcium: 9.4 mg/dL (ref 8.7–10.2)
Chloride: 105 mmol/L (ref 96–106)
Creatinine, Ser: 0.75 mg/dL (ref 0.57–1.00)
Glucose: 85 mg/dL (ref 70–99)
Potassium: 3.9 mmol/L (ref 3.5–5.2)
Sodium: 139 mmol/L (ref 134–144)
eGFR: 96 mL/min/{1.73_m2} (ref >59)

## 2022-01-27 ENCOUNTER — Encounter: Payer: Self-pay | Admitting: Physical Therapy

## 2022-01-27 ENCOUNTER — Ambulatory Visit: Payer: Commercial Managed Care - HMO | Admitting: Physical Therapy

## 2022-01-27 DIAGNOSIS — M6281 Muscle weakness (generalized): Secondary | ICD-10-CM

## 2022-01-27 DIAGNOSIS — R293 Abnormal posture: Secondary | ICD-10-CM

## 2022-01-27 NOTE — Therapy (Addendum)
OUTPATIENT PHYSICAL THERAPY THORACOLUMBAR TREATMENT   Patient Name: Tina Moss MRN: [id number] DOB:Feb 13, 1971, 51 y.o., female Today's Date: 01/27/2022    PT End of Session - 01/27/22 1106     Visit Number 3    Number of Visits 5    Date for PT Re-Evaluation 02/05/22    Authorization Type [employer]    PT Start Time 1104    PT Stop Time 1145    PT Time Calculation (min) 41 min    Activity Tolerance Patient tolerated treatment well;No increased pain    Behavior During Therapy Anxious;Restless               Past Medical History:  Diagnosis Date   Anemia    CVA (cerebral vascular accident) ([place])    2020   Epilepsy ([place])    Fibroids    H/O bacterial infection    H/O mumps    Hyperlipidemia    Hypertension    Seizures ([place])    Past Surgical History:  Procedure Laterality Date   CESAREAN SECTION     x2. 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   Vitamin D deficiency 07/17/2020   Anxiety state 07/17/2020   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/2020   Hyperlipidemia LDL goal <100 09/18/2019   Acute pain of right knee 09/17/2019   Hip pain, acute, right 09/17/2019   Abnormal uterine bleeding (AUB) 02/13/2019   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   Tobacco use 04/04/2018   Heart palpitations 12/14/2016   LVH (left ventricular hypertrophy) 12/14/2016   Depression 11/15/2016   Essential hypertension 11/15/2016   Fibroids 01/18/2012   Menorrhagia 01/18/2012   Seizure disorder ([place]) 01/18/2012   Anemia 01/18/2012    PCP: Charlott Rakes,  MD  REFERRING PROVIDER: Charlott Rakes, MD  REFERRING DIAG: M54.42,M54.41,G89.29 (ICD-10-CM) - Chronic bilateral low back pain with bilateral sciatica   Rationale for Evaluation and Treatment Rehabilitation  THERAPY DIAG:  Abnormal posture  Muscle weakness (generalized)  ONSET DATE: 12/28/2021 referral   PERTINENT HISTORY: seizures (seizure free for the last 5 years and is not currently on antiseizure medication), multiple sclerosis, L MCA CVA in 03/2017.   PRECAUTIONS: Fall and Other: h/o seizures   PATIENT GOALS: "I want to get my muscle back right"     SUBJECTIVE: Reports that she was "really sore" for 2 days after last session. Did try the ex's at home from HEP with better response. "I think I need to ex more". Needs to get thicker pillows to try the sleeping positions discussed at previous session as her are "very thin".  Marland Kitchen PAIN:  Are you having pain? Yes: NPRS scale: 4-5/10 Pain location: primarily in low back that travels up to beck and down R posterior LE  Pain description: "Like I'm being stabbed", "pinching"   Aggravating factors: prolonged sitting or standing Relieving factors: medication some, resting in certain positions     TODAY'S TREATMENT:  STRENGTHENING  5 time sit to stand: 22.94 seconds with UE assist from standard height chair  Scifit concurrent  with moist heat to thoracic  back UE/LE's level 2.0 x 6 minutes with goal >/= 30 steps per minute for strengthening, reciprocal movements and activity tolerance. Cues to move through full range of motion.   Hook lying on mat table with moist head to lower back with pball under LE's: - abdominal bracing for 5 sec's x 10 reps - anterior/posterior pelvic rocks x 10 reps - lower trunk rotation  x 10 reps each  - mini bridge x 5 reps - alternating leg lifts (marching/knee to chest motion) off/onto ball for 5 reps each side - with ball at feet for HS curls with assist to keep feet on ball x 10 reps. Incr  pain/discomfort with this ex's that did resolved with rest break afterwards.  After ex's working on deep breathing with focus on inhale/exhale to allow body to relax (decompression breathing technique) using guided      PATIENT EDUCATION:  Education details: continue with HEP Person educated: Patient Education method: Customer service manager Education comprehension: verbalized understanding and needs further education   HOME EXERCISE PROGRAM: Access Code: [email or web address] URL: [email or web address] Date: 01/20/2022 Prepared by: Willow Ora  Exercises - Supine Transversus Abdominis Bracing - Hands on Ground  - 1 x daily - 5 x weekly - 1 sets - 10 reps - 5 seconds hold - Supine Pelvic Rocking  - 1 x daily - 5 x weekly - 1 sets - 10 reps - Hooklying Single Knee to Chest Stretch with Towel  - 1 x daily - 5 x weekly - 1 sets - 1-3 reps - 15-30 seconds hold - Hooklying Lumbar Rotation  - 1 x daily - 5 x weekly - 1 sets - 5-10 reps - 3-5 seconds hold  GOALS: Goals reviewed with patient? Yes  SHORT TERM GOALS: = LTG based on POC length   LONG TERM GOALS: Target date: 02/01/2022  Pt will be independent with final HEP for improved body mechanics and strength   Baseline: to be provided Goal status: INITIAL  2.  Patient will complete Oswestry and LTG to be updated as appropriate Baseline: to be completed Goal status: INITIAL  3.  Patient will complete 5xSTS and LTG to be updated as appropriate Baseline: to be assessed Goal status: INITIAL   ASSESSMENT:  CLINICAL IMPRESSION: Today's skilled session continued to focus on strengthening and core stabilization. Pt reported "its okay" when asked about her pain at end of session. Stated only place she was felling pain was in her right thigh, "slight pain, 3-4/10".     OBJECTIVE IMPAIRMENTS Abnormal gait, decreased activity tolerance, decreased balance, decreased cognition, decreased coordination, decreased endurance,  decreased knowledge of condition, decreased mobility, decreased ROM, decreased strength, hypomobility, increased fascial restrictions, increased muscle spasms, impaired flexibility, improper body mechanics, and pain.   ACTIVITY LIMITATIONS carrying, lifting, bending, sitting, standing, squatting, sleeping, stairs, transfers, bed mobility, continence, locomotion level, and caring for others  PARTICIPATION LIMITATIONS: cleaning, interpersonal relationship, driving, shopping, community activity, occupation, and yard work  PERSONAL FACTORS Age, Behavior pattern, Education, Fitness, Past/current experiences, Social background, Time since onset of injury/illness/exacerbation, and 3+ comorbidities: L MCA, MS, seizures  are also affecting patient's functional outcome.   REHAB POTENTIAL: Fair time since onset  CLINICAL DECISION MAKING: Unstable/unpredictable  EVALUATION COMPLEXITY: High    PLAN: PT FREQUENCY: 1x/week  PT DURATION: 4 weeks  PLANNED INTERVENTIONS: Therapeutic exercises, Therapeutic activity, Neuromuscular re-education, Balance training, Gait training, Patient/Family education, Self Care, Joint mobilization, Joint manipulation, Stair training, Vestibular training, Canalith repositioning, Visual/preceptual remediation/compensation, DME instructions, Aquatic Therapy, Dry Needling,  Electrical stimulation, Spinal manipulation, Spinal mobilization, Cryotherapy, Moist heat, Taping, Manual therapy, and Re-evaluation.  PLAN FOR NEXT SESSION:  LTG's due   Willow Ora, [place], [place] [address] [place], [place] [address] [phone] 01/27/22, 1:29 PM

## 2022-02-03 ENCOUNTER — Ambulatory Visit: Payer: Commercial Managed Care - HMO

## 2022-02-04 ENCOUNTER — Ambulatory Visit: Payer: Commercial Managed Care - HMO

## 2022-02-09 ENCOUNTER — Encounter: Payer: Self-pay | Admitting: Nurse Practitioner

## 2022-02-09 ENCOUNTER — Telehealth (HOSPITAL_BASED_OUTPATIENT_CLINIC_OR_DEPARTMENT_OTHER): Payer: Commercial Managed Care - HMO | Admitting: Nurse Practitioner

## 2022-02-09 ENCOUNTER — Ambulatory Visit: Payer: Self-pay | Admitting: *Deleted

## 2022-02-09 DIAGNOSIS — J069 Acute upper respiratory infection, unspecified: Secondary | ICD-10-CM | POA: Diagnosis not present

## 2022-02-09 MED ORDER — PROMETHAZINE-DM 6.25-15 MG/5ML PO SYRP: 5.0000 mL | ORAL_SOLUTION | Freq: Four times a day (QID) | ORAL | 0 refills | Status: DC | PRN

## 2022-02-09 NOTE — Telephone Encounter (Signed)
Pt called in for assistance. Pt says that she is experiencing a cough, runny nose. Pt would like to discuss with a nurse something that she can take over the counter that could help?    Please advise.

## 2022-02-09 NOTE — Telephone Encounter (Signed)
Virtual Apt schedule for today

## 2022-02-09 NOTE — Telephone Encounter (Signed)
Summary: cold symptoms   Pt called in for assistance. Pt says that she is experiencing a cough, runny nose. Pt would like to discuss with a nurse something that she can take over the counter that could help?    Please advise.

## 2022-02-09 NOTE — Progress Notes (Addendum)
Virtual Visit via Telephone Note  I discussed the limitations, risks, security and privacy concerns of performing an evaluation and management service by telephone and the availability of in person appointments. I also discussed with the patient that there may be a patient responsible charge related to this service. The patient expressed understanding and agreed to proceed.    I connected with Tina Moss on 02/09/22  at   1:50 PM EDT  EDT by telephone and verified that I am speaking with the correct person using two identifiers.  Location of Patient: Private Residence   Location of Provider: [place] [facility] [place] participating in Telemedicine visit: Geryl Rankins FNP-BC Tina Moss    History of Present Illness: Telemedicine visit for: URI  Patient complains of symptoms of a URI. Symptoms include congestion, coryza, and cough. Onset of symptoms was a few days ago, unchanged since that time.She has not taken a COVID test. Despite my recommendation she is adamant she does not have COVID. Would like a cough medication at this time. She is a smoker. Denies fever, body aches, chills, headache or sore throat.       Past Medical History:  Diagnosis Date   Anemia    CVA (cerebral vascular accident) ([place])    2020   Epilepsy ([place])    Fibroids    H/O bacterial infection    H/O mumps    Hyperlipidemia    Hypertension    Seizures ([place])     Past Surgical History:  Procedure Laterality Date   CESAREAN SECTION     x2. at term   TEE WITHOUT CARDIOVERSION N/A 04/06/2018   Procedure: TRANSESOPHAGEAL ECHOCARDIOGRAM (TEE) BUBBLE STUDY;  Surgeon: Lelon Perla, MD;  Location: [facility] ENDOSCOPY;  Service: Cardiovascular;  Laterality: N/A;   TUBAL LIGATION     WISDOM TOOTH EXTRACTION      Family History  Problem Relation Age of Onset   Hypertension Mother    Hypertension Sister    Arthritis Sister        knees   Hypertension Brother    Deafness  Brother    Speech disorder Brother        mute   Mental illness Brother        "he can just fly off"   Mental illness Daughter        ? bipolar   Scoliosis Son     Social History   Socioeconomic History   Marital status: Divorced    Spouse name: n/a   Number of children: 2   Years of education: 11th grade   Highest education level: 11th grade  Occupational History   Occupation: Housekeeper    Comment: [employer]  Tobacco Use   Smoking status: Some Days    Types: Cigars   Smokeless tobacco: Never   Tobacco comments:    "I think I just keep so much on my mind."  Vaping Use   Vaping Use: Never used  Substance and Sexual Activity   Alcohol use: Yes    Alcohol/week: 0.0 - 3.0 standard drinks of alcohol    Comment: social   Drug use: Not Currently    Types: Marijuana    Comment: occasionally   Sexual activity: Yes    Partners: Male    Birth control/protection: None  Other Topics Concern   Not on file  Social History Narrative   Lives with her daughter.   Son is currently incarcerated in [place].   She  completed 11th grade, but was kicked out and never when back, as she had become pregnant and was raising her daughter.   Divorced x 2.   Right handed   Drinks caffeine   One story home   Social Determinants of Health   Financial Resource Strain: Not on file  Food Insecurity: Not on file  Transportation Needs: Unmet Transportation Needs (08/28/2019)   PRAPARE - Hydrologist (Medical): Yes    Lack of Transportation (Non-Medical): Yes  Physical Activity: Not on file  Stress: Not on file  Social Connections: Not on file     Observations/Objective: Awake, alert and oriented x 3   Review of Systems  Constitutional:  Negative for chills, fever and malaise/fatigue.  HENT:  Positive for congestion. Negative for ear discharge, ear pain, hearing loss, sinus pain and sore throat.        SEE HPI  Eyes: Negative.   Respiratory:  Positive for cough.  Negative for sputum production, shortness of breath and wheezing.   Cardiovascular: Negative.  Negative for chest pain, orthopnea and leg swelling.  Gastrointestinal: Negative.  Negative for abdominal pain, diarrhea, nausea and vomiting.  Neurological:  Negative for dizziness, focal weakness and headaches.  Endo/Heme/Allergies:  Negative for environmental allergies.  Psychiatric/Behavioral: Negative.      Assessment and Plan: Diagnoses and all orders for this visit:  Viral URI with cough -     promethazine-dextromethorphan (PROMETHAZINE-DM) 6.25-15 MG/5ML syrup; Take 5 mLs by mouth 4 (four) times daily as needed for cough.  INSTRUCTIONS: use a humidifier for nasal congestion Drink plenty of fluids, rest and wash hands frequently to avoid the spread of infection Alternate tylenol and Motrin for relief of fever   Follow Up Instructions Return if symptoms worsen or fail to improve.     I discussed the assessment and treatment plan with the patient. The patient was provided an opportunity to ask questions and all were answered. The patient agreed with the plan and demonstrated an understanding of the instructions.   The patient was advised to call back or seek an in-person evaluation if the symptoms worsen or if the condition fails to improve as anticipated.  I provided 8 minutes of non-face-to-face time during this encounter including median intraservice time, reviewing previous notes, labs, imaging, medications and explaining diagnosis and management.  Gildardo Pounds, FNP-BC

## 2022-02-09 NOTE — Telephone Encounter (Signed)
Summary: cold symptoms   Pt called in for assistance. Pt says that she is experiencing a cough, runny nose. Pt would like to discuss with a nurse something that she can take over the counter that could help?    Please advise.       Called pt [id number].

## 2022-02-09 NOTE — Telephone Encounter (Signed)
  Chief Complaint: Cough Symptoms: Productive cough, greenish yellow, runny nose. Keeps awake at night,fatigued, no appetite, subjective fever "At night.". Frequency: 4 days onset Pertinent Negatives: Patient denies SOB,sinus pain, headache Disposition: '[]'$ ED /'[]'$ Urgent Care (no appt availability in office) / '[x]'$ Appointment(In office/virtual)/ '[]'$  [name]/ '[]'$ Home Care/ '[]'$ Refused Recommended Disposition /'[]'$ [facility]/ '[]'$  Follow-up with PCP Additional Notes: Virtual secured for this afternoon. Care advise provided, verbalizes understanding.   Reason for Disposition  SEVERE coughing spells (e.g., whooping sound after coughing, vomiting after coughing)  Answer Assessment - Initial Assessment Questions 1. ONSET: "When did the cough begin?"      4 days ago 2. SEVERITY: "How bad is the cough today?"      Bad at night 3. SPUTUM: "Describe the color of your sputum" (none, dry cough; clear, white, yellow, green)     Light green, yellowish at night 4. HEMOPTYSIS: "Are you coughing up any blood?" If so ask: "How much?" (flecks, streaks, tablespoons, etc.)     no 5. DIFFICULTY BREATHING: "Are you having difficulty breathing?" If Yes, ask: "How bad is it?" (e.g., mild, moderate, severe)    - MILD: No SOB at rest, mild SOB with walking, speaks normally in sentences, can lie down, no retractions, pulse < 100.    - MODERATE: SOB at rest, SOB with minimal exertion and prefers to sit, cannot lie down flat, speaks in phrases, mild retractions, audible wheezing, pulse 100-120.    - SEVERE: Very SOB at rest, speaks in single words, struggling to breathe, sitting hunched forward, retractions, pulse > 120      None 6. FEVER: "Do you have a fever?" If Yes, ask: "What is your temperature, how was it measured, and when did it start?"     No. Little warm at night 7. CARDIAC HISTORY: "Do you have any history of heart disease?" (e.g., heart attack, congestive heart failure)      *No  Answer* 8. LUNG HISTORY: "Do you have any history of lung disease?"  (e.g., pulmonary embolus, asthma, emphysema)     *No Answer* 9. PE RISK FACTORS: "Do you have a history of blood clots?" (or: recent major surgery, recent prolonged travel, bedridden)     *No Answer* 10. OTHER SYMPTOMS: "Do you have any other symptoms?" (e.g., runny nose, wheezing, chest pain)       Runny nose, fatigue, no appetite  Protocols used: Cough - Acute Productive-A-AH

## 2022-02-11 ENCOUNTER — Ambulatory Visit: Payer: Commercial Managed Care - HMO | Admitting: Physician Assistant

## 2022-02-12 ENCOUNTER — Telehealth (HOSPITAL_COMMUNITY): Payer: Self-pay | Admitting: *Deleted

## 2022-02-12 NOTE — Telephone Encounter (Signed)
Reaching out to patient to offer assistance regarding upcoming cardiac imaging study; pt verbalizes understanding of appt date/time, parking situation and where to check in, pre-test NPO status and medications ordered, and verified current allergies; name and call back number provided for further questions should they arise  Tina Mayberry RN Navigator Cardiac Imaging [facility] [phone] office [phone] cell  Patient to take 100mg metoprolol tartrate two hours prior to her cardiac CT scan. She is aware to arrive at 3pm. 

## 2022-02-16 ENCOUNTER — Ambulatory Visit (HOSPITAL_COMMUNITY)
Admission: RE | Admit: 2022-02-16 | Discharge: 2022-02-16 | Disposition: A | Discharge status: Home / Self Care | Payer: Commercial Managed Care - HMO | Source: Ambulatory Visit | Attending: Interventional Cardiology | Admitting: Interventional Cardiology

## 2022-02-16 DIAGNOSIS — R072 Precordial pain: Secondary | ICD-10-CM | POA: Diagnosis not present

## 2022-02-16 MED ORDER — IOHEXOL 350 MG/ML SOLN
100.0000 mL | Freq: Once | INTRAVENOUS | Status: AC | PRN
  Administered 2022-02-16: 100 mL via INTRAVENOUS

## 2022-02-16 MED ORDER — METOPROLOL TARTRATE 5 MG/5ML IV SOLN
INTRAVENOUS | Status: AC
  Administered 2022-02-16: 10 mg via INTRAVENOUS
  Filled 2022-02-16: qty 10

## 2022-02-16 MED ORDER — METOPROLOL TARTRATE 5 MG/5ML IV SOLN: 10.0000 mg | Freq: Once | INTRAVENOUS | Status: AC

## 2022-02-16 MED ORDER — NITROGLYCERIN 0.4 MG SL SUBL
SUBLINGUAL_TABLET | SUBLINGUAL | Status: AC
  Administered 2022-02-16: 0.8 mg via SUBLINGUAL
  Filled 2022-02-16: qty 2

## 2022-02-16 MED ORDER — NITROGLYCERIN 0.4 MG SL SUBL: 0.8000 mg | SUBLINGUAL_TABLET | Freq: Once | SUBLINGUAL | Status: AC

## 2022-03-08 ENCOUNTER — Other Ambulatory Visit: Payer: Self-pay | Admitting: Family Medicine

## 2022-03-08 ENCOUNTER — Other Ambulatory Visit: Payer: Self-pay

## 2022-03-08 DIAGNOSIS — E785 Hyperlipidemia, unspecified: Secondary | ICD-10-CM

## 2022-03-08 DIAGNOSIS — I10 Essential (primary) hypertension: Secondary | ICD-10-CM

## 2022-03-09 ENCOUNTER — Other Ambulatory Visit: Payer: Self-pay

## 2022-03-09 MED ORDER — METOPROLOL TARTRATE 50 MG PO TABS
50.0000 mg | ORAL_TABLET | Freq: Two times a day (BID) | ORAL | 0 refills | Status: DC
  Filled 2022-03-09: qty 60, 30d supply, fill #0

## 2022-03-09 MED ORDER — CHLORTHALIDONE 25 MG PO TABS
25.0000 mg | ORAL_TABLET | Freq: Every day | ORAL | 0 refills | Status: DC
  Filled 2022-03-09: qty 30, 30d supply, fill #0

## 2022-03-09 MED ORDER — ATORVASTATIN CALCIUM 40 MG PO TABS
40.0000 mg | ORAL_TABLET | Freq: Every day | ORAL | 2 refills | Status: DC
  Filled 2022-03-09: qty 30, 30d supply, fill #0
  Filled 2022-04-05: qty 30, 30d supply, fill #1

## 2022-03-09 NOTE — Telephone Encounter (Signed)
Requested Prescriptions  Pending Prescriptions Disp Refills  . atorvastatin (LIPITOR) 40 MG tablet 30 tablet 2    Sig: Take 1 tablet (40 mg total) by mouth daily. at 6pm. NEEDS PASS     Cardiovascular:  Antilipid - Statins Failed - 03/08/2022 11:40 AM      Failed - Lipid Panel in normal range within the last 12 months    Cholesterol, Total  Date Value Ref Range Status  02/09/2021 216 (H) 100 - 199 mg/dL Final   LDL Chol Calc (NIH)  Date Value Ref Range Status  02/09/2021 137 (H) 0 - 99 mg/dL Final   HDL  Date Value Ref Range Status  02/09/2021 61 >39 mg/dL Final   Triglycerides  Date Value Ref Range Status  02/09/2021 101 0 - 149 mg/dL Final         Passed - Patient is not pregnant      Passed - Valid encounter within last 12 months    Recent Outpatient Visits          4 weeks ago Viral URI with cough   [place], Vernia Buff, NP   2 months ago Other chest pain   [place], Charlane Ferretti, MD   3 months ago [place], Enobong, MD   4 months ago Dermatitis   [place], MD   4 months ago Primary hypertension   [place], [place], RPH-CPP      Future Appointments            In 1 week Gildardo Pounds, NP [place]           . metoprolol tartrate (LOPRESSOR) 50 MG tablet 60 tablet 0    Sig: Take 1 tablet (50 mg total) by mouth 2 (two) times daily.     Cardiovascular:  Beta Blockers Passed - 03/08/2022 11:40 AM      Passed - Last BP in normal range    BP Readings from Last 1 Encounters:  02/16/22 113/80         Passed - Last Heart Rate in normal range    Pulse Readings from Last 1 Encounters:  02/16/22 68         Passed - Valid encounter within last 6 months    Recent Outpatient Visits          4 weeks ago Viral  URI with cough   [place], Vernia Buff, NP   2 months ago Other chest pain   [place], Charlane Ferretti, MD   3 months ago [place], Enobong, MD   4 months ago Dermatitis   [place], MD   4 months ago Primary hypertension   [place], [place], RPH-CPP      Future Appointments            In 1 week Gildardo Pounds, NP [place]           . chlorthalidone (HYGROTON) 25 MG tablet 30 tablet 0    Sig: Take 1 tablet (25 mg total) by mouth daily.     Cardiovascular: Diuretics -  Thiazide Passed - 03/08/2022 11:40 AM      Passed - Cr in normal range and within 180 days    Creatinine, Ser  Date Value Ref Range Status  01/25/2022 0.75 0.57 - 1.00 mg/dL Final         Passed - K in normal range and within 180 days    Potassium  Date Value Ref Range Status  01/25/2022 3.9 3.5 - 5.2 mmol/L Final         Passed - Na in normal range and within 180 days    Sodium  Date Value Ref Range Status  01/25/2022 139 134 - 144 mmol/L Final         Passed - Last BP in normal range    BP Readings from Last 1 Encounters:  02/16/22 113/80         Passed - Valid encounter within last 6 months    Recent Outpatient Visits          4 weeks ago Viral URI with cough   [place], Vernia Buff, NP   2 months ago Other chest pain   [place], Charlane Ferretti, MD   3 months ago [place], Enobong, MD   4 months ago Dermatitis   [place], MD   4 months ago Primary hypertension   [place], [place], RPH-CPP      Future Appointments             In 1 week Gildardo Pounds, NP [place]

## 2022-03-09 NOTE — Telephone Encounter (Signed)
Requested medication (s) are due for refill today: yes  Requested medication (s) are on the active medication list: yes    Last refill: 07/01/21  #30 2 refills  Future visit scheduled yes 03/16/22  Notes to clinic:Failed due to labs, please review. Thank you.  Requested Prescriptions  Pending Prescriptions Disp Refills   atorvastatin (LIPITOR) 40 MG tablet 30 tablet 2    Sig: Take 1 tablet (40 mg total) by mouth daily. at 6pm. NEEDS PASS     Cardiovascular:  Antilipid - Statins Failed - 03/08/2022 11:40 AM      Failed - Lipid Panel in normal range within the last 12 months    Cholesterol, Total  Date Value Ref Range Status  02/09/2021 216 (H) 100 - 199 mg/dL Final   LDL Chol Calc (NIH)  Date Value Ref Range Status  02/09/2021 137 (H) 0 - 99 mg/dL Final   HDL  Date Value Ref Range Status  02/09/2021 61 >39 mg/dL Final   Triglycerides  Date Value Ref Range Status  02/09/2021 101 0 - 149 mg/dL Final         Passed - Patient is not pregnant      Passed - Valid encounter within last 12 months    Recent Outpatient Visits           4 weeks ago Viral URI with cough   Tina Moss, Tina Buff, NP   2 months ago Other chest pain   Tina Moss, En[place]ong, MD   3 months ago Tina Moss, [place], MD   4 months ago Dermatitis   [place], MD   4 months ago Primary hypertension   Tina Moss, Tina Moss, Tina Moss       Future Appointments             In 1 week Tina Pounds, NP [place]            Signed Prescriptions Disp Refills   metoprolol tartrate (LOPRESSOR) 50 MG tablet 60 tablet 0    Sig: Take 1 tablet (50 mg total) by mouth 2 (two) times daily.     Cardiovascular:  Beta Blockers Passed - 03/08/2022 11:40 AM       Passed - Last BP in normal range    BP Readings from Last 1 Encounters:  02/16/22 113/80         Passed - Last Heart Rate in normal range    Pulse Readings from Last 1 Encounters:  02/16/22 68         Passed - Valid encounter within last 6 months    Recent Outpatient Visits           4 weeks ago Viral URI with cough   [place], Tina Buff, NP   2 months ago Other chest pain   [place], Enobong, MD   3 months ago [place], Enobong, MD   4 months ago Dermatitis   [place], MD   4 months ago Primary hypertension   [place], Tina Moss       Future Appointments  In 1 week Tina Pounds, NP Tina Moss             chlorthalidone (HYGROTON) 25 MG tablet 30 tablet 0    Sig: Take 1 tablet (25 mg total) by mouth daily.     Cardiovascular: Diuretics - Thiazide Passed - 03/08/2022 11:40 AM      Passed - Cr in normal range and within 180 days    Creatinine, Ser  Date Value Ref Range Status  01/25/2022 0.75 0.57 - 1.00 mg/dL Final         Passed - K in normal range and within 180 days    Potassium  Date Value Ref Range Status  01/25/2022 3.9 3.5 - 5.2 mmol/[place] Final         Passed - Na in normal range and within 180 days    Sodium  Date Value Ref Range Status  01/25/2022 139 134 - 144 mmol/[place] Final         Passed - Last BP in normal range    BP Readings from Last 1 Encounters:  02/16/22 113/80         Passed - Valid encounter within last 6 months    Recent Outpatient Visits           4 weeks ago Viral URI with cough   [place], Tina Buff, NP   2 months ago Other chest pain   [place], Tina Ferretti, MD   3 months ago  Tina Moss, [place], MD   4 months ago Dermatitis   [place], MD   4 months ago Primary hypertension   Tina Moss, Tina Moss, Tina Moss       Future Appointments             In 1 week Tina Pounds, NP [place]

## 2022-03-12 ENCOUNTER — Other Ambulatory Visit: Payer: Self-pay

## 2022-03-16 ENCOUNTER — Other Ambulatory Visit (HOSPITAL_COMMUNITY)
Admission: RE | Admit: 2022-03-16 | Discharge: 2022-03-16 | Disposition: A | Discharge status: Home / Self Care | Payer: Commercial Managed Care - HMO | Source: Ambulatory Visit | Attending: Nurse Practitioner | Admitting: Nurse Practitioner

## 2022-03-16 ENCOUNTER — Other Ambulatory Visit: Payer: Self-pay

## 2022-03-16 ENCOUNTER — Ambulatory Visit: Payer: Commercial Managed Care - HMO | Attending: Physician Assistant | Admitting: Nurse Practitioner

## 2022-03-16 ENCOUNTER — Encounter: Payer: Self-pay | Admitting: Nurse Practitioner

## 2022-03-16 VITALS — BP 149/95 | HR 68 | Temp 98.0°F | Ht 67.5 in | Wt 195.4 lb

## 2022-03-16 DIAGNOSIS — G8929 Other chronic pain: Secondary | ICD-10-CM

## 2022-03-16 DIAGNOSIS — N760 Acute vaginitis: Secondary | ICD-10-CM | POA: Insufficient documentation

## 2022-03-16 DIAGNOSIS — N3281 Overactive bladder: Secondary | ICD-10-CM | POA: Diagnosis not present

## 2022-03-16 DIAGNOSIS — M5441 Lumbago with sciatica, right side: Secondary | ICD-10-CM | POA: Diagnosis not present

## 2022-03-16 DIAGNOSIS — M5442 Lumbago with sciatica, left side: Secondary | ICD-10-CM

## 2022-03-16 DIAGNOSIS — R399 Unspecified symptoms and signs involving the genitourinary system: Secondary | ICD-10-CM | POA: Insufficient documentation

## 2022-03-16 LAB — POCT URINALYSIS DIP (CLINITEK)
Bilirubin, UA: NEGATIVE
Blood, UA: NEGATIVE
Glucose, UA: NEGATIVE mg/dL
Ketones, POC UA: NEGATIVE mg/dL
Leukocytes, UA: NEGATIVE
Nitrite, UA: NEGATIVE
POC PROTEIN,UA: NEGATIVE
Spec Grav, UA: 1.015 (ref 1.010–1.025)
Urobilinogen, UA: 0.2 E.U./dL
pH, UA: 7 (ref 5.0–8.0)

## 2022-03-16 MED ORDER — GABAPENTIN 300 MG PO CAPS
300.0000 mg | ORAL_CAPSULE | Freq: Three times a day (TID) | ORAL | 3 refills | Status: DC
  Filled 2022-03-16: qty 90, 30d supply, fill #0

## 2022-03-16 NOTE — Progress Notes (Signed)
Assessment & Plan:  Tina Moss was seen today for urinary frequency.  Diagnoses and all orders for this visit:  Overactive bladder -     POCT URINALYSIS DIP (CLINITEK) -     Cervicovaginal ancillary only Handout given with instructions to practice kegel exercises Await vaginal swab. If negative will start medication for OAB   Chronic bilateral low back pain with bilateral sciatica -     gabapentin (NEURONTIN) 300 MG capsule; Take 1 capsule (300 mg total) by mouth 3 (three) times daily. Work on losing weight to help reduce back pain. May alternate with heat and ice application for pain relief. May also alternate with acetaminophen and Ibuprofen as prescribed for back pain. Other alternatives include massage, acupuncture and water aerobics.       Patient has been counseled on age-appropriate routine health concerns for screening and prevention. These are reviewed and up-to-date. Referrals have been placed accordingly. Immunizations are up-to-date or declined.    Subjective:   Chief Complaint  Patient presents with   Urinary Frequency   Tina Moss 51 y.o. female presents to office today with symptoms of Overactive bladder   OAB: Patient complains of urge incontinence. This has been present for several months. She leaks urine with with urge. Patient describes the symptoms as  urge to urinate with little or no warning and urine leaking unpredictably.  Factors associated with symptoms include associated with menopause. Evaluation to date includes UA/CS: normal.Treatment to date includes none.  She also notes increased nonodorous vaginal discharge.   HTN Blood pressure is elevated today. She endorses adherence taking chlorthalidone 25 mg daily and metoprolol 50 mg BID.  BP Readings from Last 3 Encounters:  03/16/22 (!) 149/95  02/16/22 113/80  01/25/22 130/86     Back pain Lidocaine patches, muscle relaxants not working for her low back pain.  She could not take cymbalta due to  mood instability. She stopped taking this a few months ago. She has bilateral sciatica. Will switch to gabapentin and have her follow up with PCP in a few months   Review of Systems  Constitutional:  Negative for fever, malaise/fatigue and weight loss.  HENT: Negative.  Negative for nosebleeds.   Eyes: Negative.  Negative for blurred vision, double vision and photophobia.  Respiratory: Negative.  Negative for cough and shortness of breath.   Cardiovascular: Negative.  Negative for chest pain, palpitations and leg swelling.  Gastrointestinal: Negative.  Negative for heartburn, nausea and vomiting.  Genitourinary:  Positive for urgency. Negative for dysuria, flank pain, frequency and hematuria.  Musculoskeletal:  Positive for back pain. Negative for myalgias.  Neurological:  Positive for sensory change. Negative for dizziness, focal weakness, seizures and headaches.  Psychiatric/Behavioral:  Positive for memory loss. Negative for suicidal ideas.     Past Medical History:  Diagnosis Date   Anemia    CVA (cerebral vascular accident) ([place])    2020   Epilepsy ([place])    Fibroids    H/O bacterial infection    H/O mumps    Hyperlipidemia    Hypertension    Seizures ([place])     Past Surgical History:  Procedure Laterality Date   CESAREAN SECTION     x2. at term   TEE WITHOUT CARDIOVERSION N/A 04/06/2018   Procedure: TRANSESOPHAGEAL ECHOCARDIOGRAM (TEE) BUBBLE STUDY;  Surgeon: Lelon Perla, MD;  Location: [facility]COPY;  Service: Cardiovascular;  Laterality: N/A;   TUBAL LIGATION     WISDOM TOOTH EXTRACTION  Family History  Problem Relation Age of Onset   Hypertension Mother    Hypertension Sister    Arthritis Sister        knees   Hypertension Brother    Deafness Brother    Speech disorder Brother        mute   Mental illness Brother        "he can just fly off"   Mental illness Daughter        ? bipolar   Scoliosis Son     Social History Reviewed with no changes  to be made today.   Outpatient Medications Prior to Visit  Medication Sig Dispense Refill   atorvastatin (LIPITOR) 40 MG tablet Take 1 tablet (40 mg total) by mouth daily. 30 tablet 2   chlorthalidone (HYGROTON) 25 MG tablet Take 1 tablet (25 mg total) by mouth daily. 30 tablet 0   donepezil (ARICEPT) 5 MG tablet Take 1 tablet (5 mg total) by mouth at bedtime. 30 tablet 11   famotidine (PEPCID) 20 MG tablet Take 2 tablets (40 mg total) by mouth daily. 60 tablet 2   metoprolol tartrate (LOPRESSOR) 50 MG tablet Take 1 tablet (50 mg total) by mouth 2 (two) times daily. 60 tablet 0   clopidogrel (PLAVIX) 75 MG tablet Take 1 tablet (75 mg total) by mouth daily. (Patient not taking: Reported on 03/16/2022) 30 tablet 2   ferrous sulfate (FEROSUL) 325 (65 FE) MG tablet TAKE 1 TABLET (325 MG TOTAL) BY MOUTH 2 (TWO) TIMES DAILY WITH A MEAL. (Patient not taking: Reported on 03/16/2022) 60 tablet 2   hydrOXYzine (ATARAX) 10 MG tablet Take 1 tablet (10 mg total) by mouth 3 (three) times daily as needed. (Patient not taking: Reported on 03/16/2022) 60 tablet 1   losartan (COZAAR) 25 MG tablet Take 1 tablet (25 mg total) by mouth daily (Patient not taking: Reported on 03/16/2022) 90 tablet 2   baclofen (LIORESAL) 10 MG tablet Take 1 tablet (10 mg total) by mouth 2 (two) times daily. (Patient not taking: Reported on 03/16/2022) 60 each 3   clotrimazole (LOTRIMIN) 1 % cream Apply 1 application. topically 2 (two) times daily. (Patient not taking: Reported on 01/25/2022) 60 g 1   DULoxetine (CYMBALTA) 60 MG capsule Take 1 capsule (60 mg total) by mouth daily. (Patient not taking: Reported on 03/16/2022) 30 capsule 3   hydrochlorothiazide (MICROZIDE) 12.5 MG capsule  (Patient not taking: Reported on 03/16/2022)     lidocaine (LIDODERM) 5 % Place 1 patch onto the skin daily. Remove & Discard patch within 12 hours or as directed by MD (Patient not taking: Reported on 01/25/2022) 30 patch 1   lidocaine (XYLOCAINE) 2 % solution  Swish and spit 15 ml Q 4 hrs as needed for mouth and throat pain (Patient not taking: Reported on 01/25/2022) 200 mL 0   meloxicam (MOBIC) 7.5 MG tablet Take 1 tablet (7.5 mg total) by mouth daily. (Patient not taking: Reported on 03/16/2022) 30 tablet 3   nystatin-triamcinolone ointment (MYCOLOG) Apply topically 2 (two) times daily. (Patient not taking: Reported on 01/25/2022) 30 g 0   promethazine-dextromethorphan (PROMETHAZINE-DM) 6.25-15 MG/5ML syrup Take 5 mLs by mouth 4 (four) times daily as needed for cough. (Patient not taking: Reported on 03/16/2022) 240 mL 0   triamcinolone (KENALOG) 0.1 % paste Apply small film to oral lesions/ulcers twice a day as needed. (Patient not taking: Reported on 01/25/2022) 5 g 0   No facility-administered medications prior to visit.  No Known Allergies     Objective:    BP (!) 149/95   Pulse 68   Temp 98 F (36.7 C) (Oral)   Ht 5' 7.5" (1.715 m)   Wt 195 lb 6.4 oz (88.6 kg)   LMP  (LMP Unknown) Comment: more than a year since last menstrual  SpO2 96%   BMI 30.15 kg/m  Wt Readings from Last 3 Encounters:  03/16/22 195 lb 6.4 oz (88.6 kg)  01/25/22 193 lb 9.6 oz (87.8 kg)  12/28/21 192 lb 6.4 oz (87.3 kg)    Physical Exam Vitals and nursing note reviewed.  Constitutional:      Appearance: She is well-developed.  HENT:     Head: Normocephalic and atraumatic.  Cardiovascular:     Rate and Rhythm: Normal rate and regular rhythm.     Heart sounds: Normal heart sounds. No murmur heard.    No friction rub. No gallop.  Pulmonary:     Effort: Pulmonary effort is normal. No tachypnea or respiratory distress.     Breath sounds: Normal breath sounds. No decreased breath sounds, wheezing, rhonchi or rales.  Chest:     Chest wall: No tenderness.  Abdominal:     General: Bowel sounds are normal.     Palpations: Abdomen is soft.  Musculoskeletal:        General: Normal range of motion.     Cervical back: Normal range of motion.  Skin:    General:  Skin is warm and dry.  Neurological:     Mental Status: She is alert and oriented to person, place, and time.     Coordination: Coordination normal.  Psychiatric:        Behavior: Behavior normal. Behavior is cooperative.        Thought Content: Thought content normal.        Judgment: Judgment normal.          Patient has been counseled extensively about nutrition and exercise as well as the importance of adherence with medications and regular follow-up. The patient was given clear instructions to go to ER or return to medical center if symptoms don't improve, worsen or new problems develop. The patient verbalized understanding.   Follow-up: Return for F/U [place] in december or January A1c/back pain.   Gildardo Pounds, FNP-BC [facility] [place], [place]   03/16/2022, 2:19 PM

## 2022-03-17 ENCOUNTER — Other Ambulatory Visit: Payer: Self-pay | Admitting: Nurse Practitioner

## 2022-03-17 LAB — CERVICOVAGINAL ANCILLARY ONLY
Bacterial Vaginitis (gardnerella): POSITIVE — AB
Candida Glabrata: NEGATIVE
Candida Vaginitis: NEGATIVE
Chlamydia: NEGATIVE
Comment: NEGATIVE
Comment: NEGATIVE
Comment: NEGATIVE
Comment: NEGATIVE
Comment: NEGATIVE
Comment: NORMAL
Neisseria Gonorrhea: NEGATIVE
Trichomonas: NEGATIVE

## 2022-03-17 MED ORDER — METRONIDAZOLE 500 MG PO TABS
500.0000 mg | ORAL_TABLET | Freq: Two times a day (BID) | ORAL | 0 refills | Status: AC
  Filled 2022-03-17: qty 14, 7d supply, fill #0

## 2022-03-17 MED ORDER — OXYBUTYNIN CHLORIDE ER 5 MG PO TB24
5.0000 mg | ORAL_TABLET | Freq: Every day | ORAL | 1 refills | Status: DC
  Filled 2022-03-17: qty 30, 30d supply, fill #0
  Filled 2022-08-01: qty 30, 30d supply, fill #1

## 2022-03-18 ENCOUNTER — Other Ambulatory Visit: Payer: Self-pay

## 2022-04-05 ENCOUNTER — Other Ambulatory Visit: Payer: Self-pay | Admitting: Family Medicine

## 2022-04-05 ENCOUNTER — Other Ambulatory Visit: Payer: Self-pay

## 2022-04-05 DIAGNOSIS — K219 Gastro-esophageal reflux disease without esophagitis: Secondary | ICD-10-CM

## 2022-04-05 DIAGNOSIS — I10 Essential (primary) hypertension: Secondary | ICD-10-CM

## 2022-04-05 MED ORDER — FAMOTIDINE 20 MG PO TABS
40.0000 mg | ORAL_TABLET | Freq: Every day | ORAL | 0 refills | Status: DC
  Filled 2022-04-05: qty 60, 30d supply, fill #0
  Filled 2022-08-01 – 2022-11-12 (×3): qty 60, 30d supply, fill #1

## 2022-04-05 MED ORDER — METOPROLOL TARTRATE 50 MG PO TABS
50.0000 mg | ORAL_TABLET | Freq: Two times a day (BID) | ORAL | 0 refills | Status: DC
  Filled 2022-04-05: qty 60, 30d supply, fill #0

## 2022-04-05 MED ORDER — CHLORTHALIDONE 25 MG PO TABS
25.0000 mg | ORAL_TABLET | Freq: Every day | ORAL | 0 refills | Status: DC
  Filled 2022-04-05: qty 30, 30d supply, fill #0

## 2022-04-06 ENCOUNTER — Other Ambulatory Visit: Payer: Self-pay

## 2022-04-20 ENCOUNTER — Other Ambulatory Visit: Payer: Self-pay | Admitting: Nurse Practitioner

## 2022-04-20 ENCOUNTER — Other Ambulatory Visit: Payer: Self-pay

## 2022-04-20 NOTE — Telephone Encounter (Signed)
Requested medication (s) are due for refill today - no  Requested medication (s) are on the active medication list -no  Future visit scheduled -yes  Last refill: 03/17/22  Notes to clinic: medication not assigned protocol- provider review   Requested Prescriptions  Pending Prescriptions Disp Refills   metroNIDAZOLE (FLAGYL) 500 MG tablet 14 tablet 0    Sig: Take 1 tablet (500 mg total) by mouth 2 (two) times daily for 7 days.     Off-Protocol Failed - 04/20/2022  8:51 AM      Failed - Medication not assigned to a protocol, review manually.      Passed - Valid encounter within last 12 months    Recent Outpatient Visits           1 month ago Overactive bladder   [place], Vernia Buff, NP   2 months ago Viral URI with cough   [place], Vernia Buff, NP   3 months ago Other chest pain   [place], [place]obong, MD   5 months ago [place], Enobong, MD   5 months ago [place], MD       Future Appointments             In 3 weeks Charlott Rakes, MD [place]               Requested Prescriptions  Pending Prescriptions Disp Refills   metroNIDAZOLE (FLAGYL) 500 MG tablet 14 tablet 0    Sig: Take 1 tablet (500 mg total) by mouth 2 (two) times daily for 7 days.     Off-Protocol Failed - 04/20/2022  8:51 AM      Failed - Medication not assigned to a protocol, review manually.      Passed - Valid encounter within last 12 months    Recent Outpatient Visits           1 month ago Overactive bladder   [place], Vernia Buff, NP   2 months ago Viral URI with cough   [place], Vernia Buff, NP   3 months ago Other chest pain   [place], Enobong, MD   5 months ago [place], Enobong, MD   5 months ago Dermatitis   [place], MD       Future Appointments             In 3 weeks Charlott Rakes, MD [place]

## 2022-04-26 ENCOUNTER — Other Ambulatory Visit: Payer: Self-pay

## 2022-05-17 ENCOUNTER — Other Ambulatory Visit: Payer: Self-pay

## 2022-05-17 ENCOUNTER — Ambulatory Visit: Payer: Commercial Managed Care - HMO | Attending: Family Medicine | Admitting: Family Medicine

## 2022-05-17 ENCOUNTER — Telehealth: Payer: Self-pay | Admitting: Licensed Clinical Social Worker

## 2022-05-17 ENCOUNTER — Encounter: Payer: Self-pay | Admitting: Family Medicine

## 2022-05-17 VITALS — BP 133/85 | HR 106 | Temp 98.0°F | Ht 67.0 in | Wt 198.6 lb

## 2022-05-17 DIAGNOSIS — M2550 Pain in unspecified joint: Secondary | ICD-10-CM

## 2022-05-17 DIAGNOSIS — I10 Essential (primary) hypertension: Secondary | ICD-10-CM

## 2022-05-17 DIAGNOSIS — Z1231 Encounter for screening mammogram for malignant neoplasm of breast: Secondary | ICD-10-CM

## 2022-05-17 DIAGNOSIS — Z1211 Encounter for screening for malignant neoplasm of colon: Secondary | ICD-10-CM | POA: Diagnosis not present

## 2022-05-17 DIAGNOSIS — A539 Syphilis, unspecified: Secondary | ICD-10-CM

## 2022-05-17 DIAGNOSIS — R7303 Prediabetes: Secondary | ICD-10-CM | POA: Diagnosis not present

## 2022-05-17 DIAGNOSIS — M546 Pain in thoracic spine: Secondary | ICD-10-CM

## 2022-05-17 DIAGNOSIS — Z8673 Personal history of transient ischemic attack (TIA), and cerebral infarction without residual deficits: Secondary | ICD-10-CM

## 2022-05-17 DIAGNOSIS — F3289 Other specified depressive episodes: Secondary | ICD-10-CM

## 2022-05-17 DIAGNOSIS — M545 Low back pain, unspecified: Secondary | ICD-10-CM

## 2022-05-17 LAB — POCT GLYCOSYLATED HEMOGLOBIN (HGB A1C): HbA1c, POC (controlled diabetic range): 5.7 % (ref 0.0–7.0)

## 2022-05-17 MED ORDER — CHLORTHALIDONE 25 MG PO TABS
25.0000 mg | ORAL_TABLET | Freq: Every day | ORAL | 0 refills | Status: DC
  Filled 2022-05-17: qty 30, 30d supply, fill #0
  Filled 2022-07-12: qty 30, 30d supply, fill #1
  Filled 2022-08-10: qty 30, 30d supply, fill #2

## 2022-05-17 MED ORDER — FLUOXETINE HCL 20 MG PO CAPS
20.0000 mg | ORAL_CAPSULE | Freq: Every day | ORAL | 3 refills | Status: DC
  Filled 2022-05-17: qty 30, 30d supply, fill #0
  Filled 2022-07-12: qty 30, 30d supply, fill #1
  Filled 2022-08-10: qty 30, 30d supply, fill #2
  Filled 2022-10-28 – 2022-11-12 (×2): qty 30, 30d supply, fill #3

## 2022-05-17 MED ORDER — METOPROLOL TARTRATE 50 MG PO TABS
50.0000 mg | ORAL_TABLET | Freq: Two times a day (BID) | ORAL | 1 refills | Status: DC
  Filled 2022-05-17: qty 60, 30d supply, fill #0
  Filled 2022-07-12: qty 60, 30d supply, fill #1
  Filled 2022-08-10: qty 60, 30d supply, fill #2
  Filled 2022-10-28: qty 60, 30d supply, fill #3

## 2022-05-17 MED ORDER — LIDOCAINE 5 % EX PTCH
1.0000 | MEDICATED_PATCH | CUTANEOUS | 3 refills | Status: DC
  Filled 2022-05-17: qty 30, 30d supply, fill #0

## 2022-05-17 MED ORDER — CLOPIDOGREL BISULFATE 75 MG PO TABS
75.0000 mg | ORAL_TABLET | Freq: Every day | ORAL | 1 refills | Status: DC
  Filled 2022-05-17: qty 30, 30d supply, fill #0
  Filled 2022-07-12: qty 30, 30d supply, fill #1
  Filled 2022-08-10: qty 30, 30d supply, fill #2
  Filled 2022-10-28 – 2022-11-12 (×2): qty 30, 30d supply, fill #3

## 2022-05-17 NOTE — Patient Instructions (Signed)
Chronic Back Pain When back pain lasts longer than 3 months, it is called chronic back pain. Pain may get worse at certain times (flare-ups). There are things you can do at home to manage your pain. Follow these instructions at home: Pay attention to any changes in your symptoms. Take these actions to help with your pain: Managing pain and stiffness     If told, put ice on the painful area. Your doctor may tell you to use ice for 24-48 hours after the flare-up starts. To do this: Put ice in a plastic bag. Place a towel between your skin and the bag. Leave the ice on for 20 minutes, 2-3 times a day. If told, put heat on the painful area. Do this as often as told by your doctor. Use the heat source that your doctor recommends, such as a moist heat pack or a heating pad. Place a towel between your skin and the heat source. Leave the heat on for 20-30 minutes. Take off the heat if your skin turns bright red. This is especially important if you are unable to feel pain, heat, or cold. You may have a greater risk of getting burned. Soak in a warm bath. This can help relieve pain. Activity  Avoid bending and other activities that make pain worse. When standing: Keep your upper back and neck straight. Keep your shoulders pulled back. Avoid slouching. When sitting: Keep your back straight. Relax your shoulders. Do not round your shoulders or pull them backward. Do not sit or stand in one place for long periods of time. Take short rest breaks during the day. Lying down or standing is usually better than sitting. Resting can help relieve pain. When sitting or lying down for a long time, do some mild activity or stretching. This will help to prevent stiffness and pain. Get regular exercise. Ask your doctor what activities are safe for you. Do not lift anything that is heavier than 10 lb (4.5 kg) or the limit that you are told, until your doctor says that it is safe. To prevent injury when you lift  things: Bend your knees. Keep the weight close to your body. Avoid twisting. Sleep on a firm mattress. Try lying on your side with your knees slightly bent. If you lie on your back, put a pillow under your knees. Medicines Treatment may include medicines for pain and swelling taken by mouth or put on the skin, prescription pain medicine, or muscle relaxants. Take over-the-counter and prescription medicines only as told by your doctor. Ask your doctor if the medicine prescribed to you: Requires you to avoid driving or using machinery. Can cause trouble pooping (constipation). You may need to take these actions to prevent or treat trouble pooping: Drink enough fluid to keep your pee (urine) pale yellow. Take over-the-counter or prescription medicines. Eat foods that are high in fiber. These include beans, whole grains, and fresh fruits and vegetables. Limit foods that are high in fat and sugars. These include fried or sweet foods. General instructions Do not use any products that contain nicotine or tobacco, such as cigarettes, e-cigarettes, and chewing tobacco. If you need help quitting, ask your doctor. Keep all follow-up visits as told by your doctor. This is important. Contact a doctor if: Your pain does not get better with rest or medicine. Your pain gets worse, or you have new pain. You have a high fever. You lose weight very quickly. You have trouble doing your normal activities. Get help right away   if: One or both of your legs or feet feel weak. One or both of your legs or feet lose feeling (have numbness). You have trouble controlling when you poop (have a bowel movement) or pee (urinate). You have bad back pain and: You feel like you may vomit (nauseous), or you vomit. You have pain in your belly (abdomen). You have shortness of breath. You faint. Summary When back pain lasts longer than 3 months, it is called chronic back pain. Pain may get worse at certain times  (flare-ups). Use ice and heat as told by your doctor. Your doctor may tell you to use ice after flare-ups. This information is not intended to replace advice given to you by your health care provider. Make sure you discuss any questions you have with your health care provider. Document Revised: 07/11/2019 Document Reviewed: 07/11/2019 Elsevier Patient Education  2023 [employer]  

## 2022-05-17 NOTE — Progress Notes (Signed)
Pain in back and legs

## 2022-05-17 NOTE — Telephone Encounter (Signed)
Met with patient today during a warm hand off. She reports wanting to find a therapist she can talk to bi weekly. I gave her resources for several therapist that accept her insurance. Patient also discussed wanted to move out of her sisters home due to it not being good for her mental health. Unforutnely, patient does not have any income but has applied for disability and it waiting to hear back. I gave her resources for housing authority as well. Please see all resources below shared with patient.   Housing Resources  [place] Housing Search: Affordable housing database. Visit the website below to search for housing in your  budget and location parameters  MeatSub.co.za  845-240-5957  [place]: Property management for several affordable housing  communities in the area. Contact individual properties for availability.  http://www.InsuranceTransaction.co.za  [place]: assists with homelessness prevention, foreclosure prevention, healthy  homes (assistance to residents who live in homes with health and safety hazards through education,  referrals, and tenant advocacy)  Mon - Fri 8:30 am - 4:30 pm  [facility]): day center for people experiencing homelessness; offers resources  including showers, laundry, phones, mailroom, computer lab, medical clinic, bike maintenance, case  managers.  [place], [place] [address]  [place] Knock - Fri 8:00 am - 3:00 pm   [place]: [employer] (PBV) waiting list specifically for (65 years of age  and older) Near-Elderly and Elderly Only is now open. Housing Choice Voucher (Section 8) waiting list  remains closed. Apply online.  [place], [place] [address]  [phone]  http://www.gha-[place].org/  [place]: Has Section 8 and project-based voucher waiting lists open. Complete the   application online.  [address], [place], [place] [address]  [phone]  GulfSpecialist.pl  [employer]: Has wait lists open. Call to request an application be mailed to you or go to  their office to pick up an application.  [address] [place], [place] [address]  [phone]  [email or web address]   Advocacy/Legal Legal Aid [place]:  [phone]  /  [phone] /  LVM, taking clients  [place]:  [phone] /  Onsite, counseling with Avie Echevaria is virtual, Accepting new clients   Family Service of the [employer] 24-hr Crisis line:  [phone] Virtual & Onsite services (Client preference), Accepting New clients  [employer], [place]:  [phone] Virtual & Onsite services (Client preference), Accepting New clients  Court Watch (custody):  [phone] Virtual, Accepting new clients  [facility]:   [phone] Virtual/Telephone, accepting clients for waitlisting (time depends on services)   Baby & Breastfeeding [place] [facility]ation [phone] Outpatient consultant out for weeks (will be hard to get an appointment) , Support group offered Virtually (Accepting new members)  [facility] [phone] Telephone & Onsite services (Client preference), Accepting New clients  [place]: [phone] ([place]);  [phone] (HP) Virtual  [place] League:  [phone]     Childcare [place] [facility] Development: [phone] ([place]) / [phone] ([place])             - Child Care Resources/ Referrals/ Scholarships             - Head Start/ Early Head Start (call or apply online) Virtually (by appointment), Accepting new families  [place] DHHS: [place] Pre-K :  [phone] / [phone]     Employment / [place]: [phone] / [place]  Onsite services (Client preference), Accepting New clients  [facility] [facility] ([place]): [phone] ([place]) / [phone] ([place]) Virtual & Onsite  workshops, Accepting new clients  [place] Resource/ [place]: [phone] / [phone] Virtual & Onsite , Accepting New clients  [place]: [phone]   DHHS Work First: [phone] ([place]) / [phone] ([place]) Virtually, Accepting clients   [place]:  [phone]  Virtual and Onsite, Accepting new clients     [place]:  [phone] Virtual (financial assistance) & Onsite (all other services), Accepting new families  [employer]: [phone] [employer] Network (furniture):  [place] Helping Hands: [phone]   [place]: [phone] Virtual, accepting new families    [place]- SNAP/ Food Stamps: ([phone] Virtual  WIC: [phone] ;  HP [phone] Virtual        During the summer, text "FOOD" to [place] / Clinics (Adults) [place] (for Adults) through [facility]: [phone]    [place]:   [place]:   [phone]   Health Department:  [place]:  [phone] / [phone]   [facility] [place]:   [phone] Onsite, Accepting new patients  [facility]:   [phone] Onsite , Accepting new patients    [place]:   [place]:  [place]:  [place] for [facility] [place]):  [phone] Onsite, Accepting new people  [facility]:  [phone] Virtual, accepting new individuals  [place]:  [place], Accepting new individuals  [place]:  [phone] Virtual, Accepting new clients  African Services Coalition:  [place].org  Virtual, Accepting new members  PFLAG  708-645-6225 / info_0 .org  Virtual, Accepting new Members  [facility]:  [phone]  Virtual    Mental Health/ Substance Use Family Service of the [facility]  [phone] [address] [place] [address] Virtual & Onsite services (Client preference), Accepting New clients  [place]:  ([phone] or [phone] [address] [place], [place] [address] Onsite & Virtual, Accepting new clients  [facility]:  [phone] [address] [place], [place] [address] Virtual & Onsite, Accepting new clients  [facility]:  ([phone] Margarita Sermons [place], [place]) [address] [place], [place] [address] Onsite & Virtual, Accepting new clients  [place] (walk-ins)  [phone] / [place]:  [phone] Virtual meetings via [place]- need meeting passcode- call [phone] to receive code "AFG"= Al-Anon Family Group  [email or web address]-[email or web address]   Alcoholics Anonymous:  867-737-3668 LacrosseRugby.dk  Narcotics Anonymous:  159-470-7615 18 hour helpline: 323-709-8492  Quit Smoking Hotline:  800-QUIT-NOW 747-197-6647)    [facility]                                              [phone] [place], [place], [place] [address] Onsite & Virtual, Accepting new clients    COUNSELING AGENCIES in [place] (Accepting Medicaid)   [place]  (* = Spanish available;  + =  Psychiatric services) * Family Service of the [place] [address] [place], [place] [address] Virtual & Onsite services (Client preference), Accepting [place]:                                        [phone] or [phone] Virtual & Onsite, Accepting clients  +[facility] Total Access Care                                [phone]   [facility]:                                                  [phone]   + [place]:                                           [phone] Onsite & Virtual, Accepting new clients  [place]                               [phone] Onsite, Accepting new clients  * Family Solutions:                                                     [phone] Virtual, NOT accepting new clients  The Social Emotional Learning (SEL) Group           [phone] Virtual, accepting new clients  Youth Focus:                                                            [place], Accepting new clients  [facility]:                                        [phone] Onsite & Virtual, [place] 6-8 months for services  [place]:                             [place]                                                [phone] Onsite & Virtual, Accepting new clients  + [facility]:             [phone] or [place], Accepting new clients  *+ [place] Sessions (walk-ins)                                                [phone] / [place]   My [place]                                              [phone] [place], Accepting new clients  Youth Unlimited (PCIT)                                              [phone] [place] (check in occasionally , subject to change)    Substance Use [place]:                                [phone]  Alcoholics Anonymous:      865-419-7390  Narcotics Anonymous:       435-394-5305  Quit Smoking Hotline:         800-QUIT-NOW 508-580-3012[facility](859)012-7686 Provides information on mental health, intellectual/developmental disabilities & substance abuse services in [place]:  [phone] Virtual , Accepting families  YWCA: [phone]   UNCG: Bringing Out the Best:  [place] at Three (Hispanic families): [phone] Onsite, Accepting new children ( short wait list)  Healthy Start ([place]):  [phone]   Parents as Teachers:  [phone] Virtually, accepting families ( waitlist for Spanish speaking families )  [place] Child Psychologist, counselling- Learning Together (Immigrants): [phone]     Poison Control [phone]   Sports [employer] Recreation [employer] Open Doors Application: ImDemand.es Onsite, Accepting new families  [place] of [place]: http://www.Cash-Tobias.gov/index.aspx?page=3615 Onsite    Special Needs Family Support Network:  (620) 678-2859 Virtual, Accepting new families  Autism Society of [place]:   [place] or 2501474558 /  5862388944 Virtual, Accepting new families   [facility]:  [phone] Virtual, Accepting families  ARC of [place]:  ([phone] Virtual, Accepting new families  [place] (CDSA):  7752048473 Virtual, Accepting new families  [place] (Care Coordination for Children):  [phone] Virtual, accepting new patients     Transportation Medicaid Transportation: 867-312-9649 to apply  [place]: ([phone] (reduced-fare bus ID to [place])  SCAT Paratransit services: Eligible riders only, call [phone] for application    Tutoring/ [place]: [phone] No tutoring only afterschool programming (In Person), Accepting new students  Big Brothers/ Big  Sisters: [phone] Letta Kocher)  [phone] (HP)   ACES through child's school: [place]: contact your local Y In Person, Accepting New students  SHIELD Mentor Program: [phone] Will re-launch in the fall    Counseling Resources   https://www.InternetEnthusiasts.hu  [facility] [address] [place], [place] [address] [phone] or [phone] Walk-in urgent care 24/7 for anyone  For [facility] ONLY New patient assessments and therapy walk-ins: Monday and Wednesday 8am-11am First and second Friday 1pm-5pm New patient psychiatry and medication management walk-ins: Mondays, Wednesdays, Thursdays, Fridays 8am-11am No psychiatry walk-ins on Tuesday   *Accepts all insurance and uninsured for Urgent Care needs *Accepts Medicaid and uninsured for outpatient treatment   [facility] (Therapy and psychiatry) [facility] at [facility] (near [place]) [address] [place], [place] [address] [phone] Fax: [phone] ([place])   [place] at [place],  [place]  [address] [phone] Call for appointment  [facility] the [place] (Therapy only)  [facility] [place] [address], [place], [place] [address] Monday - Friday: 8:30 a.m.-12 p.m. / 1 p.m.-2:30 p.m.  [facility] [address] [place], [place] [address] Monday-Friday: 8:30 a.m.-12 p.m. / 2-3:30 p.m. (INSURANCE REQUIRED -MEDICAID ACCEPTED) They do offer a sliding fee scale $20-$30/session   [facility] Counseling [place], [place] [address] Phone: [address] [place] [address]  Phone: [phone] (Does not accept Medicaid) (only one provider accepts Medicare)  [facility] [address] (at [employer], [place] [phone] (Accepts Medicaid and Medicare)  [facility]) [address] [place]  [place], [place] [address]  Phone: [phone]  [address] [place], [place]  [address] Phone: [phone] [facility] Medicaid) [facility] (Therapy only)  [address], [place], [place] [address] Phone: [phone]   [facility] (Therapy only)  [place], [place] [address] Phone: [phone] [facility] Medicaid & Medicare)   [place] [address], [place], [place] [address] Phone: [phone] ([place]) [employer] [phone] [place], [place] [address] Phone: [phone] [facility]) [facility] (Psychiatry only)  [phone] [address], [place], [place] [address] (Accepts Medicaid and Medicare) [place] (Psychiatry and therapy)  [place], [place], [place] [address] [phone] [facility] Medicare) [place] (psychiatry and therapy) [address], [place], [place] [address] [phone]  [facility]-Located at [address], [place], [place], [place] [address] [phone] Accept [address], [place], [place], [place], [place],  and the following types of Medicaid; Alliance, [place], Partners, [place], [place], [employer], Healthy [place], [place] Complete, and [place], as well as offering a Manufacturing systems engineer and private payment options. Provides In-Office Appointments, Virtual Appointments, and Phone Consultations Offers medication management for ages 60 years old and up, including,  Medication Management for Suboxone and [place] [phone] [address], [place], [place] [address] ([place] Medicaid and Medicare)         19.  [facility] (therapy only)  [address] [place],  [address]            [phone] (Accepts medicare) 20. Alcohol and  Drug Services  (Suboxone and methodone) [phone] [address] [place], [place] [address] To Be Eligible for Opioid Treatment at  ADS you must be at least 52 years of age you have already tried other interventions that were not successful such as opioid detox, inpatient rehab for opioids, or outpatient counseling specifically for opioid dependency your [facility] drug test must be completely free of benzodiazepines (klonopin, xanax, valium, ativan, or other benz) you have reliable transportation to the [facility]S clinic in [place] you recognize that counseling is a critical component of AD[facility]' Opioid Program and you agree to attend all required counseling sessions you are committed to total drug abstinence and will conscientiously strive to remain free of alcohol, marijuana, and other illicit substances while in treatment you desire a peaceful treatment atmosphere in which personal responsibility and respect toward staff and clients is the norm   21. [facility] [place], [place], [place] [address] Offers SAIOP (Substance Abuse Intensive Outpatient Program) [phone] 22. [facility] counseling [address] [place] [place], [place] [address] [phone] (Accepts medicare)  For those who are tech savvy, go on psychology today, type in your local city (i.e. [place]. [place]) and specify your insurance at the top of the screen after you search. (Medicaid if needed). You can also specify whether you are interested in therapy and psychiatry.  www.psychologytoday.com/us

## 2022-05-17 NOTE — Progress Notes (Unsigned)
Subjective:  Patient ID: Tina Moss, female    DOB: 1970/12/25  Age: 51 y.o. MRN: [id number]  CC: Back Pain   HPI DERIN MATTHES is a 51 y.o. year old female with a history of seizures (seizure free for the last 5 years and is not currently on antiseizure medication), multiple sclerosis, L MCA CVA in 03/2017 .   Interval History:  She Complains of her body being in pain. She Complains of a 'big giant belt around her abdomen radiating to her back'. It comes straight down her back, her legs are weak and she feels like she is about to fall. This has been present for 1 month. She has not had any recent falls but states her right knee feels like it will give put. She Complains of an instance while driving when she had to pull over as she was seeing spots, felt numb and was feeling like she would be drifting off.  MRI from 03/2022 revealed: IMPRESSION: 1. No acute intracranial abnormality. 2. 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 3. Mild right frontal paranasal sinus inflammation.    At the moment pain in her back is 11/10. She has undergone Physical Therapy in the past with no relief. She feels depressed due to her physical conditions and also stress from her osn being incarcerated. She has housing challenges and is staying with her sister. In the past she was on Cymbalta which she states made her feel weird.  Endorses adherence with her antihypertensive and has no adverse effects from her medication. At her last visit she was diagnosed with syphilis and had received treatment.  She is due for follow-up titer. Past Medical History:  Diagnosis Date   Anemia    CVA (cerebral vascular accident) ([place])    2020   Epilepsy ([place])    Fibroids    H/O bacterial infection    H/O mumps    Hyperlipidemia    Hypertension    Seizures ([place])     Past Surgical History:   Procedure Laterality Date   CESAREAN SECTION     x2. at term   TEE WITHOUT CARDIOVERSION N/A 04/06/2018   Procedure: TRANSESOPHAGEAL ECHOCARDIOGRAM (TEE) BUBBLE STUDY;  Surgeon: Lelon Perla, MD;  Location: [facility] ENDOSCOPY;  Service: Cardiovascular;  Laterality: N/A;   TUBAL LIGATION     WISDOM TOOTH EXTRACTION      Family History  Problem Relation Age of Onset   Hypertension Mother    Hypertension Sister    Arthritis Sister        knees   Hypertension Brother    Deafness Brother    Speech disorder Brother        mute   Mental illness Brother        "he can just fly off"   Mental illness Daughter        ? bipolar   Scoliosis Son     Social History   Socioeconomic History   Marital status: Divorced    Spouse name: n/a   Number of children: 2   Years of education: 11th grade   Highest education level: 11th grade  Occupational History   Occupation: Housekeeper    Comment: [employer]  Tobacco Use   Smoking status: Some Days    Types: Cigars   Smokeless tobacco: Never   Tobacco comments:    "I think I just keep so  much on my mind."  Vaping Use   Vaping Use: Never used  Substance and Sexual Activity   Alcohol use: Yes    Alcohol/week: 0.0 - 3.0 standard drinks of alcohol    Comment: social   Drug use: Not Currently    Types: Marijuana    Comment: occasionally   Sexual activity: Yes    Partners: Male    Birth control/protection: None  Other Topics Concern   Not on file  Social History Narrative   Lives with her daughter.   Son is currently incarcerated in [place].   She completed 11th grade, but was kicked out and never when back, as she had become pregnant and was raising her daughter.   Divorced x 2.   Right handed   Drinks caffeine   One story home   Social Determinants of Health   Financial Resource Strain: Not on file  Food Insecurity: Not on file  Transportation Needs: Unmet Transportation Needs (08/28/2019)   PRAPARE - Radiographer, therapeutic (Medical): Yes    Lack of Transportation (Non-Medical): Yes  Physical Activity: Not on file  Stress: Not on file  Social Connections: Not on file    No Known Allergies  Outpatient Medications Prior to Visit  Medication Sig Dispense Refill   atorvastatin (LIPITOR) 40 MG tablet Take 1 tablet (40 mg total) by mouth daily. 30 tablet 2   donepezil (ARICEPT) 5 MG tablet Take 1 tablet (5 mg total) by mouth at bedtime. 30 tablet 11   famotidine (PEPCID) 20 MG tablet Take 2 tablets (40 mg total) by mouth daily. 180 tablet 0   gabapentin (NEURONTIN) 300 MG capsule Take 1 capsule (300 mg total) by mouth 3 (three) times daily. 90 capsule 3   hydrOXYzine (ATARAX) 10 MG tablet Take 1 tablet (10 mg total) by mouth 3 (three) times daily as needed. 60 tablet 1   losartan (COZAAR) 25 MG tablet Take 1 tablet (25 mg total) by mouth daily 90 tablet 2   oxybutynin (DITROPAN XL) 5 MG 24 hr tablet Take 1 tablet (5 mg total) by mouth at bedtime. For overactive bladder 30 tablet 1   chlorthalidone (HYGROTON) 25 MG tablet Take 1 tablet (25 mg total) by mouth daily. 90 tablet 0   clopidogrel (PLAVIX) 75 MG tablet Take 1 tablet (75 mg total) by mouth daily. 30 tablet 2   metoprolol tartrate (LOPRESSOR) 50 MG tablet Take 1 tablet (50 mg total) by mouth 2 (two) times daily. 180 tablet 0   ferrous sulfate (FEROSUL) 325 (65 FE) MG tablet TAKE 1 TABLET (325 MG TOTAL) BY MOUTH 2 (TWO) TIMES DAILY WITH A MEAL. (Patient not taking: Reported on 03/16/2022) 60 tablet 2   No facility-administered medications prior to visit.     ROS Review of Systems  Constitutional:  Negative for activity change and appetite change.  HENT:  Negative for sinus pressure and sore throat.   Respiratory:  Negative for chest tightness, shortness of breath and wheezing.   Cardiovascular:  Negative for chest pain and palpitations.  Gastrointestinal:  Negative for abdominal distention, abdominal pain and constipation.  Genitourinary:  Negative.   Musculoskeletal:  Positive for back pain.  Psychiatric/Behavioral:  Positive for dysphoric mood. Negative for behavioral problems.     Objective:  BP 133/85   Pulse (!) 106   Temp 98 F (36.7 C) (Oral)   Ht _0  (1.702 m)   Wt 198 lb 9.6 oz (90.1 kg)   SpO2  96%   BMI 31.11 kg/m      05/17/2022    2:58 PM 03/16/2022    1:48 PM 02/16/2022    3:44 PM  BP/Weight  Systolic BP 465 681 275  Diastolic BP 85 95 80  Wt. (Lbs) 198.6 195.4   BMI 31.11 kg/m2 30.15 kg/m2       Physical Exam Constitutional:      Appearance: She is well-developed.  Cardiovascular:     Rate and Rhythm: Tachycardia present.     Heart sounds: Normal heart sounds. No murmur heard. Pulmonary:     Effort: Pulmonary effort is normal.     Breath sounds: Normal breath sounds. No wheezing or rales.  Chest:     Chest wall: No tenderness.  Abdominal:     General: Bowel sounds are normal. There is no distension.     Palpations: Abdomen is soft. There is no mass.     Tenderness: There is no abdominal tenderness.  Musculoskeletal:        General: Normal range of motion.     Right lower leg: No edema.     Left lower leg: No edema.     Comments: Tenderness on palpation of left scapular region and into the thoracolumbar spine Negative straight leg raise bilaterally  Neurological:     Mental Status: She is alert and oriented to person, place, and time.  Psychiatric:        Mood and Affect: Mood normal.        Latest Ref Rng & Units 01/25/2022    4:49 PM 12/28/2021   11:23 AM 09/10/2021    9:23 AM  CMP  Glucose 70 - 99 mg/dL 85  96  91   BUN 6 - 24 mg/dL _0 Creatinine 0.57 - 1.00 mg/dL 0.75  0.72  0.65   Sodium 134 - 144 mmol/L 139  139  142   Potassium 3.5 - 5.2 mmol/L 3.9  3.8  3.7   Chloride 96 - 106 mmol/L 105  103  106   CO2 20 - 29 mmol/L _1 Calcium 8.7 - 10.2 mg/dL 9.4  9.5  9.6   Total Protein 6.0 - 8.3 g/dL   7.3   Total Bilirubin 0.2 - 1.2 mg/dL   0.3    Alkaline Phos 39 - 117 U/L   123   AST 0 - 37 U/L   19   ALT 0 - 35 U/L   22     Lipid Panel     Component Value Date/Time   CHOL 216 (H) 02/09/2021 1203   TRIG 101 02/09/2021 1203   HDL 61 02/09/2021 1203   CHOLHDL 3.5 02/09/2021 1203   CHOLHDL 2.9 04/05/2018 0535   VLDL 13 04/05/2018 0535   LDLCALC 137 (H) 02/09/2021 1203    CBC    Component Value Date/Time   WBC 4.7 10/16/2021 0843   WBC 3.2 (L) 09/10/2021 0923   RBC 4.35 10/16/2021 0843   RBC 4.53 09/10/2021 0923   HGB 13.1 10/16/2021 0843   HCT 39.6 10/16/2021 0843   PLT 247 10/16/2021 0843   MCV 91 10/16/2021 0843   MCH 30.1 10/16/2021 0843   MCH 31.2 04/02/2021 2034   MCHC 33.1 10/16/2021 0843   MCHC 32.7 09/10/2021 0923   RDW 13.2 10/16/2021 0843   LYMPHSABS 1.4 10/16/2021 0843   MONOABS 0.4 04/02/2021 2034   EOSABS 0.0 10/16/2021 0843   BASOSABS 0.0  10/16/2021 0843    Lab Results  Component Value Date   HGBA1C 5.7 05/17/2022    Assessment & Plan:  1. Prediabetes Labs reveal prediabetes with an A1c of 5.7.  Working on a low carbohydrate diet, exercise, weight loss is recommended in order to prevent progression to type 2 diabetes mellitus.  - POCT glycosylated hemoglobin (Hb A1C)  2. Primary hypertension Controlled Continue current regimen Counseled on blood pressure goal of less than 130/80, low-sodium, DASH diet, medication compliance, 150 minutes of moderate intensity exercise per week. Discussed medication compliance, adverse effects. - LP+Non-HDL Cholesterol; Future - chlorthalidone (HYGROTON) 25 MG tablet; Take 1 tablet (25 mg total) by mouth daily.  Dispense: 90 tablet; Refill: 0 - metoprolol tartrate (LOPRESSOR) 50 MG tablet; Take 1 tablet (50 mg total) by mouth 2 (two) times daily.  Dispense: 180 tablet; Refill: 1  3. Encounter for screening mammogram for malignant neoplasm of breast - MM 3D SCREEN BREAST BILATERAL; Future  4. Screening for colon cancer - Ambulatory referral to  Gastroenterology  5. Thoracolumbar back pain Uncontrolled She did not do well on Cymbalta in the past Tried PT in the past with no improvement Advised to apply heat or ice whichever is tolerated to painful areas. Counseled on evidence of improvement in pain control with regards to yoga, water aerobics, massage, home physical therapy, exercise as tolerated. - lidocaine (LIDODERM) 5 %; Place 1 patch onto the skin daily. Remove & Discard patch within 12 hours or as directed by MD  Dispense: 30 patch; Refill: 3 - Amb referral to Pediatric Physical Medicine Rehab - Multiple Myeloma Panel (SPEP&IFE w/QIG); Future  6. Syphilis Treated Monitoring with repeat RPR today and again in 11/2022 - RPR; Future  7. Other depression Secondary to medical condition and psychosocial situation LCSW called in for counseling  Will initiate Prozac - FLUoxetine (PROZAC) 20 MG capsule; Take 1 capsule (20 mg total) by mouth daily.  Dispense: 30 capsule; Refill: 3  8. Arthralgia of multiple joints - Sedimentation rate; Future - C-reactive protein; Future - Anti-Smith antibody; Future - Anti-DNA antibody, double-stranded; Future - ANA,IFA RA Diag Pnl w/rflx Tit/Patn; Future  9. History of stroke Risk factor modification Continue statin - clopidogrel (PLAVIX) 75 MG tablet; Take 1 tablet (75 mg total) by mouth daily.  Dispense: 90 tablet; Refill: 1    Meds ordered this encounter  Medications   chlorthalidone (HYGROTON) 25 MG tablet    Sig: Take 1 tablet (25 mg total) by mouth daily.    Dispense:  90 tablet    Refill:  0   clopidogrel (PLAVIX) 75 MG tablet    Sig: Take 1 tablet (75 mg total) by mouth daily.    Dispense:  90 tablet    Refill:  1   metoprolol tartrate (LOPRESSOR) 50 MG tablet    Sig: Take 1 tablet (50 mg total) by mouth 2 (two) times daily.    Dispense:  180 tablet    Refill:  1   FLUoxetine (PROZAC) 20 MG capsule    Sig: Take 1 capsule (20 mg total) by mouth daily.    Dispense:   30 capsule    Refill:  3   lidocaine (LIDODERM) 5 %    Sig: Place 1 patch onto the skin daily. Remove & Discard patch within 12 hours or as directed by MD    Dispense:  30 patch    Refill:  3    Return in about 3 months (around 08/16/2022) for Arthralgia.  Visit required 49 minutes of patient care including median intraservice time, reviewing previous notes and test results, coordination of care, counseling the patient in addition to management of chronic medical conditions.Time also spent ordering medications, investigations and documenting in the chart.  All questions were answered to the patient's satisfaction    Charlott Rakes, MD, FAAFP. [facility] and [place], [place]   05/18/2022, 5:42 PM

## 2022-05-18 ENCOUNTER — Encounter: Payer: Self-pay | Admitting: Family Medicine

## 2022-05-18 ENCOUNTER — Ambulatory Visit: Payer: Commercial Managed Care - HMO | Admitting: Podiatry

## 2022-05-19 ENCOUNTER — Other Ambulatory Visit: Payer: Self-pay

## 2022-05-19 ENCOUNTER — Ambulatory Visit (INDEPENDENT_AMBULATORY_CARE_PROVIDER_SITE_OTHER): Payer: Commercial Managed Care - HMO | Admitting: Physician Assistant

## 2022-05-19 VITALS — Resp 20 | Ht 67.0 in | Wt 201.0 lb

## 2022-05-19 DIAGNOSIS — F3289 Other specified depressive episodes: Secondary | ICD-10-CM | POA: Diagnosis not present

## 2022-05-19 DIAGNOSIS — R413 Other amnesia: Secondary | ICD-10-CM | POA: Diagnosis not present

## 2022-05-19 MED ORDER — DONEPEZIL HCL 10 MG PO TABS
ORAL_TABLET | ORAL | 11 refills | Status: DC
  Filled 2022-05-19: qty 30, 30d supply, fill #0
  Filled 2022-07-12: qty 30, 30d supply, fill #1
  Filled 2022-08-10: qty 30, 30d supply, fill #2
  Filled 2022-10-28 – 2022-11-12 (×2): qty 30, 30d supply, fill #3

## 2022-05-19 NOTE — Progress Notes (Signed)
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 RH female  with  a history of hypertension, hyperlipidemia, LM cerebral artery stroke, anemia, Vit D deficiency,  tobacco use,  anxiety, depression, history of memory impairment of uncertain etiology, recent syphilis in June 2023 presenting today in follow-up for evaluation of memory loss. Patient is on donepezil 5 mg daily.  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 She reports that her memory may be slightly worse.  However, she also has been more depressed than prior, at times with some erratic behavior while driving.  She does not have a psychiatrist per her report.  She is still able to perform activities of daily living.  Last MoCA on March 2023 was 18/30.  Today, her MoCA was slightly worse at 17/30.     Recommendations:   Follow up in 6  months. Increase donepezil to 10 mg 1 tablet daily, side effects discussed Referral to psychiatry for major depression Continue to control cardiovascular risk factors Continue to control mood as per PCP, until she is able to establish care with psychiatry. Monitor driving.    Subjective:   This patient is here alone . Previous records as well as any outside records available were reviewed prior to todays visit.  She was last seen on 6 12/2021.  Last MoCA on March 2023 was 18/30    Any changes in memory since last visit? Has to write the tasks otherwise she will forget, also she has issues with comprehension, initially she thought the Aricept 5 mg daily was working but when starting some other meds including pain control, these may have contributed to  "sending me back".  Sometimes I feel I am going to scream because I do not remember" repeats oneself?  Endorsed  Disoriented when walking into a room? Sometimes I don't remember where I am going.  She denies any motor vehicle accident recently. Leaving objects in unusual places?  "Cannot find glasses, papers, and medicines. Sometimes I find them in weird places, I don't know why " Ambulates  with difficulty?   Does home PT Recent falls?  "Lost my balance when my knees are really bad" Does not like to use a cane Any head injuries?  Patient denies   History of seizures?   Patient denies   Wandering behavior?  Patient denies   Patient drives?  Sometimes I do not know exactly where I am driving but if I see a truck sometimes I feel compelled to drive towards it.  Any personality changes since last visit?  "My family tells me all the time, however by my memory is and that depresses me.  " Any worsening depression?:  Endorsed, it is not clear if she has any passive suicidal ideation she does not elaborate Hallucinations?  Patient denies   Paranoia?  Patient denies   Patient reports that she has "messed up sleep "without vivid dreams, REM behavior or sleepwalking   History of sleep apnea?"I can't tell, I don't know "  Any hygiene concerns?  Patient denies   Independent of bathing and dressing?  Endorsed  Does the patient needs help with medications? Patient In charge, forgets some doses  Who is in  charge of the finances? " My momma is in charge "   Any changes in appetite "I don't eat as much asI used to " Patient have trouble swallowing? Patient denies   Does the patient cook?  Endorsed Any kitchen accidents such as leaving the stove on? Sometimes she forgets Any headaches?  Patient denies   Double vision? Patient denies  I get floaters  Any focal numbness or tingling?  Patient denies   Chronic back pain Endorsed with R leg pain, doing PT, followed by PCP, being referred to NS pending on the MRI  spine results  (ordered yesterday by PCP) Unilateral weakness?  Patient denies   Any tremors?  Patient denies   Any history of anosmia?  Patient denies   Any incontinence of urine? Endorsed, "sometimes I leak " Any bowel dysfunction?  Chronic constipation  Patient lives  sister    The patient is seen in neurologic consultation at the request of Charlott Rakes, MD for the evaluation of memory.  The patient is here alone. This is a 51 y.o. year old RH  female who has no recalling when her memory issues began, "I will send my by Dr. Here".  She reports that she is very distracted, forgets appointments or things to do, forgets the time and she is unable to comprehend well.  She states that when she was a child, she was diagnosed with epilepsy, but she is not aware when she stopped taking medications for it.  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  Of note, during this visit, the patient does have difficulty comprehending, expressing as well as being incoherent at times.  She works at [employer], and has noted that lately she has been burning some of the biscuits.  When she drives, sometimes she does not know where she goes with the car and has to stop, and she reports that occasionally she has a "blank ".  She leaves objects "everywhere, the other day I left my keys in the drawer, and papers near the mattress ".  She ambulates without difficulty, although in the past she had right-sided weakness without residual due to the stroke.  Her mood is depressed, denies any intermittent irritability.  She sleeps fairly well, denies vivid dreams or sleepwalking.  She does have some hallucinations seeing "floating blocks, little colors dancing ".  Sometimes, she reports being paranoid.  She denies any hygiene concerns, she is independent of bathing and dressing.  The medications are in a pillbox, but she forgets sometimes to take them.  When asked about who  is in charge of the finances, incoherent answer is reported, stating "there are calls that the insurance lady takes care of ".  She cannot give a straight answer regarding finances.  Appetite is good, denies trouble swallowing.  She denies any headaches, "sometimes I have double vision" denies any dizziness, focal numbness or tingling, or unilateral weakness.  She denies tremors or anosmia.  Denies urine incontinence, sometimes she has intermittent constipation and diarrhea.  Denies sleep apnea, or alcohol.  She smokes less than a pack a day "I used to smoke more ".    MRI brain 04/03/21 No acute intracranial abnormality. 2. 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 3. Mild right frontal paranasal sinus inflammation.   MRI of the brain March 2023 1. No acute  intracranial pathology. No significant change since the study from 04/03/2021. 2. 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  Labs 09/09/21 TSH 1.040, T4 1.69, A1C 6, B12 1022   Past Medical History:  Diagnosis Date   Anemia    CVA (cerebral vascular accident) ([place])    2020   Epilepsy ([place])    Fibroids    H/O bacterial infection    H/O mumps    Hyperlipidemia    Hypertension    Seizures ([place])      Past Surgical History:  Procedure Laterality Date   CESAREAN SECTION     x2. at term   TEE WITHOUT CARDIOVERSION N/A 04/06/2018   Procedure: TRANSESOPHAGEAL ECHOCARDIOGRAM (TEE) BUBBLE STUDY;  Surgeon: Lelon Perla, MD;  Location: [facility] ENDOSCOPY;  Service: Cardiovascular;  Laterality: N/A;   TUBAL LIGATION     WISDOM TOOTH EXTRACTION       PREVIOUS MEDICATIONS:   CURRENT MEDICATIONS:  Outpatient Encounter Medications as of 05/19/2022  Medication Sig    atorvastatin (LIPITOR) 40 MG tablet Take 1 tablet (40 mg total) by mouth daily.   chlorthalidone (HYGROTON) 25 MG tablet Take 1 tablet (25 mg total) by mouth daily.   clopidogrel (PLAVIX) 75 MG tablet Take 1 tablet (75 mg total) by mouth daily.   famotidine (PEPCID) 20 MG tablet Take 2 tablets (40 mg total) by mouth daily.   FLUoxetine (PROZAC) 20 MG capsule Take 1 capsule (20 mg total) by mouth daily.   gabapentin (NEURONTIN) 300 MG capsule Take 1 capsule (300 mg total) by mouth 3 (three) times daily.   hydrOXYzine (ATARAX) 10 MG tablet Take 1 tablet (10 mg total) by mouth 3 (three) times daily as needed.   lidocaine (LIDODERM) 5 % Place 1 patch onto the skin daily. Remove & Discard patch within 12 hours or as directed by MD   losartan (COZAAR) 25 MG tablet Take 1 tablet (25 mg total) by mouth daily   metoprolol tartrate (LOPRESSOR) 50 MG tablet Take 1 tablet (50 mg total) by mouth 2 (two) times daily.   oxybutynin (DITROPAN XL) 5 MG 24 hr tablet Take 1 tablet (5 mg total) by mouth at bedtime. For overactive bladder   [DISCONTINUED] donepezil (ARICEPT) 5 MG tablet Take 1 tablet (5 mg total) by mouth at bedtime.   donepezil (ARICEPT) 10 MG tablet take 1 tablet by mouth daily   ferrous sulfate (FEROSUL) 325 (65 FE) MG tablet TAKE 1 TABLET (325 MG TOTAL) BY MOUTH 2 (TWO) TIMES DAILY WITH A MEAL. (Patient not taking: Reported on 03/16/2022)   No facility-administered encounter medications on file as of 05/19/2022.     Objective:     PHYSICAL EXAMINATION:    VITALS:   Vitals:   05/19/22 1430  Resp: 20  Weight: 201 lb (91.2 kg)  Height: '5\' 7"'$  (1.702 m)    GEN:  The patient appears stated age and is in NAD. HEENT:  Normocephalic, atraumatic.   Neurological examination:  General: NAD, well-groomed, appears stated age.  Anxious appearing Orientation: The patient is alert. Oriented to person, not to place but to date Cranial nerves: There is good facial symmetry.The speech is fluent and  clear. No aphasia or dysarthria. Fund of knowledge is appropriate.  Recent and remote memory impaired.  Attention and concentration are reduced.  Able to name objects and repeat phrases.  Hearing is  intact to conversational tone.    Sensation: Sensation is intact to light touch throughout Motor: Strength is at least antigravity x4. Tremors: none  DTR's 2/4 in UE/LE      05/19/2022    2:00 PM 09/10/2021    8:00 AM  Montreal Cognitive Assessment   Visuospatial/ Executive (0/5) 2 3  Naming (0/3) 1 2  Attention: Read list of digits (0/2) 2 2  Attention: Read list of letters (0/1) 1 1  Attention: Serial 7 subtraction starting at 100 (0/3) 0 0  Language: Repeat phrase (0/2) 1 0  Language : Fluency (0/1) 0 1  Abstraction (0/2) 0 0  Delayed Recall (0/5) 4 2  Orientation (0/6) 5 6  Total 16 17  Adjusted Score (based on education) 16 18       09/09/2021   10:56 AM 02/16/2017   12:00 PM  MMSE - Mini Mental State Exam  Not completed:  Unable to complete  Orientation to time 4   Orientation to Place 5   Registration 3   Attention/ Calculation 5   Recall 2   Language- name 2 objects 2   Language- repeat 1   Language- follow 3 step command 3   Language- read & follow direction 1   Write a sentence 1   Copy design 1   Total score 28        Movement examination: Tone: There is normal tone in the UE/LE Abnormal movements:  no tremor.  No myoclonus.  No asterixis.   Coordination:  There is no decremation with RAM's. Normal finger to nose  Gait and Station: The patient has some difficulty arising out of a deep-seated chair without the use of the hands. The patient's stride length is good.  Gait is cautious and narrow.   Thank you for allowing [place] the opportunity to participate in the care of this nice patient. Please do not hesitate to contact us for any questions or concerns.   Total time spent on today's visit was 39 minutes dedicated to this patient today, preparing to see patient,  examining the patient, ordering tests and/or medications and counseling the patient, documenting clinical information in the EHR or other health record, independently interpreting results and communicating results to the patient/family, discussing treatment and goals, answering patient's questions and coordinating care.  Cc:  Charlott Rakes, MD  Sharene Butters 05/19/2022 5:23 PM

## 2022-05-19 NOTE — Patient Instructions (Addendum)
It was a pleasure to see you today at our office.   Recommendations:  Follow up in  6 months Increase Donepezil  10 mg 1 tablet  daily. Referral to psychiatry   Whom to call:  Memory  decline, memory medications: Call our office [phone]   For psychiatric meds, mood meds: Please have your primary care physician manage these medications.   Counseling regarding caregiver distress, including caregiver depression, anxiety and issues regarding community resources, adult day care programs, adult living facilities, or memory care questions:   Feel free to contact Lozano, Social Worker at [phone]   For assessment of decision of mental capacity and competency:  Call Dr. Anthoney Harada, geriatric psychiatrist at [phone]   If you have any severe symptoms of a stroke, or other severe issues such as confusion,severe chills or fever, etc call 911 or go to the ER as you may need to be evaluated further      RECOMMENDATIONS FOR ALL PATIENTS WITH MEMORY PROBLEMS: 1. Continue to exercise (Recommend 30 minutes of walking everyday, or 3 hours every week) 2. Increase social interactions - continue going to [place] and enjoy social gatherings with friends and family 3. Eat healthy, avoid fried foods and eat more fruits and vegetables 4. Maintain adequate blood pressure, blood sugar, and blood cholesterol level. Reducing the risk of stroke and cardiovascular disease also helps promoting better memory. 5. Avoid stressful situations. Live a simple life and avoid aggravations. Organize your time and prepare for the next day in anticipation. 6. Sleep well, avoid any interruptions of sleep and avoid any distractions in the bedroom that may interfere with adequate sleep quality 7. Avoid sugar, avoid sweets as there is a strong link between excessive sugar intake, diabetes, and cognitive impairment We discussed the Mediterranean diet, which has been shown to help patients reduce the  risk of progressive memory disorders and reduces cardiovascular risk. This includes eating fish, eat fruits and green leafy vegetables, nuts like almonds and hazelnuts, walnuts, and also use olive oil. Avoid fast foods and fried foods as much as possible. Avoid sweets and sugar as sugar use has been linked to worsening of memory function.  There is always a concern of gradual progression of memory problems. If this is the case, then we may need to adjust level of care according to patient needs. Support, both to the patient and caregiver, should then be put into place.    FALL PRECAUTIONS: Be cautious when walking. Scan the area for obstacles that may increase the risk of trips and falls. When getting up in the mornings, sit up at the edge of the bed for a few minutes before getting out of bed. Consider elevating the bed at the head end to avoid drop of blood pressure when getting up. Walk always in a well-lit room (use night lights in the walls). Avoid area rugs or power cords from appliances in the middle of the walkways. Use a walker or a cane if necessary and consider physical therapy for balance exercise. Get your eyesight checked regularly.  FINANCIAL OVERSIGHT: Supervision, especially oversight when making financial decisions or transactions is also recommended.  HOME SAFETY: Consider the safety of the kitchen when operating appliances like stoves, microwave oven, and blender. Consider having supervision and share cooking responsibilities until no longer able to participate in those. Accidents with firearms and other hazards in the house should be identified and addressed as well.   ABILITY TO BE LEFT ALONE: If patient is  unable to contact 911 operator, consider using LifeLine, or when the need is there, arrange for someone to stay with patients. Smoking is a fire hazard, consider supervision or cessation. Risk of wandering should be assessed by caregiver and if detected at any point, supervision  and safe proof recommendations should be instituted.  MEDICATION SUPERVISION: Inability to self-administer medication needs to be constantly addressed. Implement a mechanism to ensure safe administration of the medications.   DRIVING: Regarding driving, in patients with progressive memory problems, driving will be impaired. We advise to have someone else do the driving if trouble finding directions or if minor accidents are reported. Independent driving assessment is available to determine safety of driving.   If you are interested in the driving assessment, you can contact the following:  [employer] in [place]  [place] [phone]  [place]  [facility], [place] [phone] or [phone]

## 2022-05-24 ENCOUNTER — Ambulatory Visit: Payer: Commercial Managed Care - HMO | Attending: Nurse Practitioner

## 2022-05-24 DIAGNOSIS — A539 Syphilis, unspecified: Secondary | ICD-10-CM

## 2022-05-24 DIAGNOSIS — I10 Essential (primary) hypertension: Secondary | ICD-10-CM

## 2022-05-24 DIAGNOSIS — M2550 Pain in unspecified joint: Secondary | ICD-10-CM

## 2022-05-24 DIAGNOSIS — M545 Low back pain, unspecified: Secondary | ICD-10-CM

## 2022-05-26 LAB — LP+NON-HDL CHOLESTEROL
Cholesterol, Total: 209 mg/dL — ABNORMAL HIGH (ref 100–199)
HDL: 49 mg/dL (ref >39)
LDL Chol Calc (NIH): 136 mg/dL — ABNORMAL HIGH (ref 0–99)
Total Non-HDL-Chol (LDL+VLDL): 160 mg/dL — ABNORMAL HIGH (ref 0–129)
Triglycerides: 135 mg/dL (ref 0–149)
VLDL Cholesterol Cal: 24 mg/dL (ref 5–40)

## 2022-05-26 LAB — ANA,IFA RA DIAG PNL W/RFLX TIT/PATN
ANA Titer 1: NEGATIVE
Cyclic Citrullin Peptide Ab: 2 units (ref 0–19)
Rheumatoid fact SerPl-aCnc: <10 IU/mL (ref <14.0)

## 2022-05-26 LAB — MULTIPLE MYELOMA PANEL, SERUM
Albumin SerPl Elph-Mcnc: 3.9 g/dL (ref 2.9–4.4)
Albumin/Glob SerPl: 1.1 (ref 0.7–1.7)
Alpha 1: 0.2 g/dL (ref 0.0–0.4)
Alpha2 Glob SerPl Elph-Mcnc: 0.7 g/dL (ref 0.4–1.0)
B-Globulin SerPl Elph-Mcnc: 1.1 g/dL (ref 0.7–1.3)
Gamma Glob SerPl Elph-Mcnc: 1.6 g/dL (ref 0.4–1.8)
Globulin, Total: 3.6 g/dL (ref 2.2–3.9)
IgA/Immunoglobulin A, Serum: 366 mg/dL — ABNORMAL HIGH (ref 87–352)
IgG (Immunoglobin G), Serum: 1488 mg/dL (ref 586–1602)
IgM (Immunoglobulin M), Srm: 173 mg/dL (ref 26–217)
Total Protein: 7.5 g/dL (ref 6.0–8.5)

## 2022-05-26 LAB — C-REACTIVE PROTEIN: CRP: 2 mg/L (ref 0–10)

## 2022-05-26 LAB — ANTI-DNA ANTIBODY, DOUBLE-STRANDED: dsDNA Ab: <1 IU/mL (ref 0–9)

## 2022-05-26 LAB — SEDIMENTATION RATE: Sed Rate: 14 mm/hr (ref 0–40)

## 2022-05-26 LAB — RPR, QUANT+TP ABS (REFLEX)
Rapid Plasma Reagin, Quant: 1:8 {titer} — ABNORMAL HIGH
T Pallidum Abs: REACTIVE — AB

## 2022-05-26 LAB — RPR: RPR Ser Ql: REACTIVE — AB

## 2022-05-26 LAB — ANTI-SMITH ANTIBODY: ENA SM Ab Ser-aCnc: <0.2 AI (ref 0.0–0.9)

## 2022-05-27 ENCOUNTER — Other Ambulatory Visit: Payer: Self-pay

## 2022-05-27 ENCOUNTER — Ambulatory Visit: Payer: Commercial Managed Care - HMO | Admitting: Podiatry

## 2022-05-27 ENCOUNTER — Other Ambulatory Visit: Payer: Self-pay | Admitting: Family Medicine

## 2022-05-27 ENCOUNTER — Encounter: Payer: Self-pay | Admitting: Podiatry

## 2022-05-27 ENCOUNTER — Encounter: Payer: Self-pay | Admitting: Physical Medicine and Rehabilitation

## 2022-05-27 DIAGNOSIS — L603 Nail dystrophy: Secondary | ICD-10-CM

## 2022-05-27 DIAGNOSIS — E785 Hyperlipidemia, unspecified: Secondary | ICD-10-CM

## 2022-05-27 MED ORDER — ATORVASTATIN CALCIUM 80 MG PO TABS
80.0000 mg | ORAL_TABLET | Freq: Every day | ORAL | 6 refills | Status: DC
  Filled 2022-05-27 – 2022-08-10 (×3): qty 30, 30d supply, fill #0
  Filled 2022-10-28 – 2022-11-12 (×2): qty 30, 30d supply, fill #1

## 2022-05-30 NOTE — Progress Notes (Signed)
Subjective:  Patient ID: Tina Moss, female    DOB: 27-May-1971,  MRN: [id number] HPI Chief Complaint  Patient presents with   Nail Problem    Toenails - thick and discolored x years, hard to cut due to health conditions, concerned she may have a fungus, sometimes thickness causes tenderness   New Patient (Initial Visit)    51 y.o. female presents with the above complaint.   ROS: Denies fever chills nausea vomit muscle aches pains calf pain back pain chest pain shortness of breath  Past Medical History:  Diagnosis Date   Anemia    CVA (cerebral vascular accident) ([place])    2020   Epilepsy ([place])    Fibroids    H/O bacterial infection    H/O mumps    Hyperlipidemia    Hypertension    Seizures ([place])    Past Surgical History:  Procedure Laterality Date   CESAREAN SECTION     x2. at term   TEE WITHOUT CARDIOVERSION N/A 04/06/2018   Procedure: TRANSESOPHAGEAL ECHOCARDIOGRAM (TEE) BUBBLE STUDY;  Surgeon: Tina Perla, MD;  Location: [facility] ENDOSCOPY;  Service: Cardiovascular;  Laterality: N/A;   TUBAL LIGATION     WISDOM TOOTH EXTRACTION      Current Outpatient Medications:    atorvastatin (LIPITOR) 80 MG tablet, Take 1 tablet (80 mg total) by mouth daily., Disp: 30 tablet, Rfl: 6   chlorthalidone (HYGROTON) 25 MG tablet, Take 1 tablet (25 mg total) by mouth daily., Disp: 90 tablet, Rfl: 0   clopidogrel (PLAVIX) 75 MG tablet, Take 1 tablet (75 mg total) by mouth daily., Disp: 90 tablet, Rfl: 1   donepezil (ARICEPT) 10 MG tablet, take 1 tablet by mouth daily, Disp: 30 tablet, Rfl: 11   famotidine (PEPCID) 20 MG tablet, Take 2 tablets (40 mg total) by mouth daily., Disp: 180 tablet, Rfl: 0   FLUoxetine (PROZAC) 20 MG capsule, Take 1 capsule (20 mg total) by mouth daily., Disp: 30 capsule, Rfl: 3   gabapentin (NEURONTIN) 300 MG capsule, Take 1 capsule (300 mg total) by mouth 3 (three) times daily., Disp: 90 capsule, Rfl: 3   hydrOXYzine (ATARAX) 10 MG tablet, Take 1 tablet (10  mg total) by mouth 3 (three) times daily as needed., Disp: 60 tablet, Rfl: 1   lidocaine (LIDODERM) 5 %, Place 1 patch onto the skin daily. Remove & Discard patch within 12 hours or as directed by MD, Disp: 30 patch, Rfl: 3   losartan (COZAAR) 25 MG tablet, Take 1 tablet (25 mg total) by mouth daily, Disp: 90 tablet, Rfl: 2   metoprolol tartrate (LOPRESSOR) 50 MG tablet, Take 1 tablet (50 mg total) by mouth 2 (two) times daily., Disp: 180 tablet, Rfl: 1   oxybutynin (DITROPAN XL) 5 MG 24 hr tablet, Take 1 tablet (5 mg total) by mouth at bedtime. For overactive bladder, Disp: 30 tablet, Rfl: 1  No Known Allergies Review of Systems Objective:  There were no vitals filed for this visit.  General: Well developed, nourished, in no acute distress, alert and oriented x3   Dermatological: Skin is warm, dry and supple bilateral. Nails x 10 are thick yellow dystrophic clinically mycotic d; remaining integument appears unremarkable at this time. There are no open sores, no preulcerative lesions, no rash or signs of infection present.  Vascular: Dorsalis Pedis artery and Posterior Tibial artery pedal pulses are 2/4 bilateral with immedate capillary fill time. Pedal hair growth present. No varicosities and no lower extremity edema present bilateral.  Neruologic: Grossly intact via light touch bilateral. Vibratory intact via tuning fork bilateral. Protective threshold with Semmes Wienstein monofilament intact to all pedal sites bilateral. Patellar and Achilles deep tendon reflexes 2+ bilateral. No Babinski or clonus noted bilateral.   Musculoskeletal: No gross boney pedal deformities bilateral. No pain, crepitus, or limitation noted with foot and ankle range of motion bilateral. Muscular strength 5/5 in all groups tested bilateral.  Gait: Unassisted, Nonantalgic.    Radiographs:  None taken  Assessment & Plan:   Assessment: Nail dystrophy cannot rule out onychomycosis.    Plan: Nail samples and  skin samples were taken today for pathologic evaluation we will follow-up with her in 1 month.     Tina Moss T. [place], [place]

## 2022-06-03 ENCOUNTER — Other Ambulatory Visit: Payer: Self-pay

## 2022-06-17 ENCOUNTER — Ambulatory Visit: Payer: Self-pay | Admitting: *Deleted

## 2022-06-17 ENCOUNTER — Other Ambulatory Visit: Payer: Self-pay

## 2022-06-17 ENCOUNTER — Encounter (HOSPITAL_COMMUNITY): Payer: Self-pay

## 2022-06-17 ENCOUNTER — Ambulatory Visit (INDEPENDENT_AMBULATORY_CARE_PROVIDER_SITE_OTHER): Payer: Commercial Managed Care - HMO

## 2022-06-17 ENCOUNTER — Ambulatory Visit (HOSPITAL_COMMUNITY)
Admission: EM | Admit: 2022-06-17 | Discharge: 2022-06-17 | Disposition: A | Discharge status: Home / Self Care | Payer: Commercial Managed Care - HMO | Attending: Emergency Medicine | Admitting: Emergency Medicine

## 2022-06-17 DIAGNOSIS — R059 Cough, unspecified: Secondary | ICD-10-CM | POA: Diagnosis not present

## 2022-06-17 DIAGNOSIS — J069 Acute upper respiratory infection, unspecified: Secondary | ICD-10-CM | POA: Diagnosis not present

## 2022-06-17 MED ORDER — PROMETHAZINE-DM 6.25-15 MG/5ML PO SYRP
5.0000 mL | ORAL_SOLUTION | Freq: Every evening | ORAL | 0 refills | Status: DC
  Filled 2022-06-17 – 2022-08-01 (×2): qty 118, 23d supply, fill #0

## 2022-06-17 MED ORDER — ALBUTEROL SULFATE HFA 108 (90 BASE) MCG/ACT IN AERS
INHALATION_SPRAY | RESPIRATORY_TRACT | Status: AC
  Filled 2022-06-17: qty 6.7

## 2022-06-17 MED ORDER — PREDNISONE 20 MG PO TABS
20.0000 mg | ORAL_TABLET | Freq: Every day | ORAL | 0 refills | Status: DC
  Filled 2022-06-17: qty 5, 5d supply, fill #0

## 2022-06-17 MED ORDER — BENZONATATE 100 MG PO CAPS
100.0000 mg | ORAL_CAPSULE | Freq: Three times a day (TID) | ORAL | 0 refills | Status: DC
  Filled 2022-06-17: qty 24, 8d supply, fill #0

## 2022-06-17 MED ORDER — ALBUTEROL SULFATE HFA 108 (90 BASE) MCG/ACT IN AERS
2.0000 | INHALATION_SPRAY | Freq: Once | RESPIRATORY_TRACT | Status: AC
  Administered 2022-06-17: 2 via RESPIRATORY_TRACT

## 2022-06-17 NOTE — Telephone Encounter (Signed)
Message from Erick Blinks sent at 06/17/2022  9:53 AM EST  Summary: Seeking appt, nothing available. Flu symptoms   Pt called reporting that she does not feel well at all, she is seeking an appt. She has a terrible cough, has been exposed to two family members with the flu and pneumonia. Best contact: [phone]          Call History   Type Contact Phone/Fax User  06/17/2022 09:51 AM EST Phone (Incoming) Dnasia, Gauna (Self) [phone] Jerilynn Mages) [place]   Reason for Disposition  [1] Patient is NOT HIGH RISK AND [2] strongly requests antiviral medicine AND [3] flu symptoms present < 48 hours    Pt at urgent care now so was agreeable to be evaluated there.  Answer Assessment - Initial Assessment Questions 1. WORST SYMPTOM: "What is your worst symptom?" (e.g., cough, runny nose, muscle aches, headache, sore throat, fever)      Coughing real bad, sore throat and body aches.   Been exposed to family members who have the flu and pneumonia. She is at the urgent care now so I encouraged her to stay there are be seen since there are no appts at [facility].   She was agreeable to this plan.    No further triage needed.    2. ONSET: "When did your flu symptoms start?"      Not asked 3. COUGH: "How bad is the cough?"       Bad cough 4. RESPIRATORY DISTRESS: "Describe your breathing."       5. FEVER: "Do you have a fever?" If Yes, ask: "What is your temperature, how was it measured, and when did it start?"      6. EXPOSURE: "Were you exposed to someone with influenza?"       Yes  2 family members with the flu 7. FLU VACCINE: "Did you get a flu shot this year?"     Not asked 8. HIGH RISK DISEASE: "Do you have any chronic medical problems?" (e.g., heart or lung disease, asthma, weak immune system, or other HIGH RISK conditions)     Not asked 9. PREGNANCY: "Is there any chance you are pregnant?" "When was your last menstrual period?"     Not asked 10. OTHER SYMPTOMS:  "Do you have any other symptoms?"  (e.g., runny nose, muscle aches, headache, sore throat)       Body aches and sore throat and don't feel good  Protocols used: Influenza - [facility]

## 2022-06-17 NOTE — ED Triage Notes (Signed)
Pt presents with c/o cough and congestion. States she had been exposed to pneumonia and the flu. Pt states she started feeling sick late at night. C/o body aches, cough that causes rib pain and states she has taken mucinex, which has not helped.

## 2022-06-17 NOTE — ED Provider Notes (Addendum)
[place]    CSN: [id number] Arrival date & time: 06/17/22  1000      History   Chief Complaint Chief Complaint  Patient presents with   Cough   Nasal Congestion   Headache   Generalized Body Aches    HPI Tina Moss is a 52 y.o. female.  Patient presents complaining of productive cough with green sputum and nasal congestion that started 2 weeks ago.  She reports that her symptoms are progressively worsening. Patient reports this past week she has become more fatigued and has generalized body aches.  Patient reports that she had an exposure to flu from her grandchild before onset of symptoms.  Patient reports shortness of breath and wheezing at times.  She has taken Mucinex and TheraFlu with minimal relief of symptoms.  Patient denies any previous history of pneumonia or respiratory problems, she does state that at one time she had to use an inhaler but is unaware as to why.  She reports occasionally smoking a  "black and mild".    Cough Associated symptoms: headaches, rhinorrhea, shortness of breath (Upon exertion) and wheezing   Associated symptoms: no chest pain, no chills, no ear pain, no fever and no sore throat   Headache Associated symptoms: congestion, cough, fatigue and nausea (intermittent)   Associated symptoms: no abdominal pain, no diarrhea, no drainage, no ear pain, no fever, no sinus pressure, no sore throat and no vomiting     Past Medical History:  Diagnosis Date   Anemia    CVA (cerebral vascular accident) ([place])    2020   Epilepsy ([place])    Fibroids    H/O bacterial infection    H/O mumps    Hyperlipidemia    Hypertension    Seizures ([place])     Patient Active Problem List   Diagnosis Date Noted   Prediabetes 10/06/2021   Memory impairment 09/10/2021   History of epilepsy 09/10/2021   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   Vitamin D deficiency 07/17/2020   Anxiety state 07/17/2020   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 at term   TEE WITHOUT CARDIOVERSION N/A 04/06/2018   Procedure: TRANSESOPHAGEAL ECHOCARDIOGRAM (TEE) BUBBLE STUDY;  Surgeon: Lelon Perla, MD;  Location: [facility] ENDOSCOPY;  Service: Cardiovascular;  Laterality: N/A;   TUBAL LIGATION     WISDOM TOOTH EXTRACTION      OB History     Gravida  3   Para  2   Term  2   Preterm      AB  1   Living  2      SAB  1   IAB      Ectopic      Multiple      Live Births  2        Obstetric Comments  Term c/s x 2.  Home Medications    Prior to Admission medications   Medication Sig Start Date End Date Taking? Authorizing Provider  benzonatate (TESSALON) 100 MG capsule Take 1 capsule (100 mg total) by mouth every 8 (eight) hours. 06/17/22  Yes Flossie Dibble, NP  predniSONE (DELTASONE) 20 MG tablet Take 1 tablet (20 mg total) by mouth daily. 06/17/22  Yes Flossie Dibble, NP  promethazine-dextromethorphan (PROMETHAZINE-DM) 6.25-15 MG/5ML syrup Take 5 mLs by mouth at bedtime. 06/17/22  Yes Flossie Dibble, NP  atorvastatin (LIPITOR) 80 MG tablet Take 1 tablet (80 mg total) by mouth daily. 05/27/22   Charlott Rakes, MD  chlorthalidone (HYGROTON) 25 MG tablet Take 1 tablet (25 mg total) by mouth daily. 05/17/22   Charlott Rakes, MD  clopidogrel (PLAVIX) 75 MG tablet Take 1 tablet (75 mg total) by mouth daily. 05/17/22   Charlott Rakes, MD   donepezil (ARICEPT) 10 MG tablet take 1 tablet by mouth daily 05/19/22   Rondel Jumbo, PA-C  famotidine (PEPCID) 20 MG tablet Take 2 tablets (40 mg total) by mouth daily. 04/05/22   Charlott Rakes, MD  FLUoxetine (PROZAC) 20 MG capsule Take 1 capsule (20 mg total) by mouth daily. 05/17/22   Charlott Rakes, MD  gabapentin (NEURONTIN) 300 MG capsule Take 1 capsule (300 mg total) by mouth 3 (three) times daily. 03/16/22   Gildardo Pounds, NP  hydrOXYzine (ATARAX) 10 MG tablet Take 1 tablet (10 mg total) by mouth 3 (three) times daily as needed. 10/23/21   Charlott Rakes, MD  lidocaine (LIDODERM) 5 % Place 1 patch onto the skin daily. Remove & Discard patch within 12 hours or as directed by MD 05/17/22   Charlott Rakes, MD  losartan (COZAAR) 25 MG tablet Take 1 tablet (25 mg total) by mouth daily 07/01/21   Charlott Rakes, MD  metoprolol tartrate (LOPRESSOR) 50 MG tablet Take 1 tablet (50 mg total) by mouth 2 (two) times daily. 05/17/22   Charlott Rakes, MD  oxybutynin (DITROPAN XL) 5 MG 24 hr tablet Take 1 tablet (5 mg total) by mouth at bedtime. For overactive bladder 03/17/22   Gildardo Pounds, NP    Family History Family History  Problem Relation Age of Onset   Hypertension Mother    Hypertension Sister    Arthritis Sister        knees   Hypertension Brother    Deafness Brother    Speech disorder Brother        mute   Mental illness Brother        "he can just fly off"   Mental illness Daughter        ? bipolar   Scoliosis Son     Social History Social History   Tobacco Use   Smoking status: Some Days    Types: Cigars   Smokeless tobacco: Never   Tobacco comments:    "I think I just keep so much on my mind."  Vaping Use   Vaping Use: Never used  Substance Use Topics   Alcohol use: Yes    Alcohol/week: 0.0 - 3.0 standard drinks of alcohol    Comment: social   Drug use: Not Currently    Types: Marijuana    Comment: occasionally     Allergies   Patient has no  known allergies.   Review of Systems Review of Systems  Constitutional:  Positive for activity change, appetite change and fatigue. Negative for chills and fever.  HENT:  Positive for congestion and  rhinorrhea. Negative for ear discharge, ear pain, postnasal drip, sinus pressure, sinus pain and sore throat.   Eyes: Negative.   Respiratory:  Positive for cough, shortness of breath (Upon exertion) and wheezing. Negative for chest tightness.   Cardiovascular:  Negative for chest pain, palpitations and leg swelling.  Gastrointestinal:  Positive for nausea (intermittent). Negative for abdominal pain, constipation, diarrhea and vomiting.  Neurological:  Positive for headaches.     Physical Exam Triage Vital Signs ED Triage Vitals  Enc Vitals Group     BP 06/17/22 1219 139/82     Pulse Rate 06/17/22 1219 (!) 120     Resp 06/17/22 1219 18     Temp 06/17/22 1219 99 F (37.2 C)     Temp Source 06/17/22 1219 Oral     SpO2 06/17/22 1219 97 %     Weight --      Height --      Head Circumference --      Peak Flow --      Pain Score 06/17/22 1218 8     Pain Loc --      Pain Edu? --      Excl. in [place]? --    No data found.  Updated Vital Signs BP 139/82 (BP Location: Right Arm)   Pulse 95   Temp 99 F (37.2 C) (Oral)   Resp 18   LMP  (LMP Unknown) Comment: more than a year since last menstrual  SpO2 97%     Physical Exam Vitals and nursing note reviewed.  Constitutional:      Appearance: She is ill-appearing.  HENT:     Right Ear: Hearing, tympanic membrane, ear canal and external ear normal.     Left Ear: Hearing, tympanic membrane, ear canal and external ear normal.     Nose: No congestion or rhinorrhea.     Right Turbinates: Not enlarged, swollen or pale.     Left Turbinates: Not enlarged, swollen or pale.     Mouth/Throat:     Mouth: Mucous membranes are moist.     Pharynx: Oropharynx is clear. Uvula midline. No pharyngeal swelling, oropharyngeal exudate, posterior  oropharyngeal erythema or uvula swelling.     Tonsils: No tonsillar exudate or tonsillar abscesses. 0 on the right. 0 on the left.  Cardiovascular:     Rate and Rhythm: Normal rate and regular rhythm.     Heart sounds: Normal heart sounds, S1 normal and S2 normal.  Pulmonary:     Effort: Tachypnea present.     Breath sounds: Examination of the right-middle field reveals rhonchi. Examination of the left-middle field reveals rhonchi. Examination of the right-lower field reveals decreased breath sounds. Examination of the left-lower field reveals decreased breath sounds. Decreased breath sounds and rhonchi present. No wheezing or rales.  Lymphadenopathy:     Cervical: No cervical adenopathy.     Right cervical: No superficial cervical adenopathy.    Left cervical: No superficial cervical adenopathy.  Neurological:     Mental Status: She is alert.      UC Treatments / Results  Labs (all labs ordered are listed, but only abnormal results are displayed) Labs Reviewed - No data to display  EKG   Radiology DG Chest 2 View  Result Date: 06/17/2022 CLINICAL DATA:  Cough for 2 weeks. EXAM: CHEST - 2 VIEW COMPARISON:  One-view x-ray 12/02/2019 FINDINGS: Elevated left hemidiaphragm again seen. No consolidation, pneumothorax or effusion. Normal cardiopericardial silhouette. Calcified aorta. No edema. Stable slight fullness to  the right lung hilum. Possible pulmonary vessels. IMPRESSION: Elevated left hemidiaphragm similar to previous.  No consolidation Electronically Signed   By: Jill Side M.D.   On: 06/17/2022 13:48    Procedures Procedures (including critical care time)  Medications Ordered in UC Medications  albuterol (VENTOLIN HFA) 108 (90 Base) MCG/ACT inhaler 2 puff (2 puffs Inhalation Given 06/17/22 1310)    Initial Impression / Assessment and Plan / UC Course  I have reviewed the triage vital signs and the nursing notes.  Pertinent labs & imaging results that were available during  my care of the patient were reviewed by me and considered in my medical decision making (see chart for details).    Patient was evaluated for viral URI.  Chest X-Ray performed showed no consolidation or infiltrate.  Albuterol inhaler given in office provided some relief of symptoms.  Etiology of symptoms may be related to an inflammatory response caused by an initial viral illness.  Prednisone, Tessalon, and Promethazine DM was sent to the pharmacy.  Patient was made aware of safety precautions with Promethazine DM.  Patient was made aware of treatment regiment.  Patient was made aware of timeline for symptom resolution and when follow-up would be necessary.  Patient verbalized understanding of instructions.   Charting was provided using a a verbal dictation system, charting was proofread for errors, errors may occur which could change the meaning of the information charted.   Final Clinical Impressions(s) / UC Diagnoses   Final diagnoses:  Viral URI with cough     Discharge Instructions      Albuterol inhaler was given to you in office, you may use this every 6 hours as needed. Tessalon has been sent to the pharmacy for cough, you can use this medication every 8 hours as needed. Promethazine DM has been sent to the pharmacy, you can use this medication before bedtime, please do not operate any heavy machinery or drive a car after taking this medication. Prednisone is a steroid, this medication has been sent to the pharmacy, you will take 1 tablets for the next 5 mornings.    If symptoms are not improving you can follow-up with this office or with your PCP.   If you develop any severe/concerning symptoms at home please go to the nearest emergency department; such as worsening shortness of breath, chest pain, worsening lightheadedness.      ED Prescriptions     Medication Sig Dispense Auth. Provider   promethazine-dextromethorphan (PROMETHAZINE-DM) 6.25-15 MG/5ML syrup Take 5 mLs by mouth  at bedtime. 118 mL Flossie Dibble, NP   benzonatate (TESSALON) 100 MG capsule Take 1 capsule (100 mg total) by mouth every 8 (eight) hours. 24 capsule Flossie Dibble, NP   predniSONE (DELTASONE) 20 MG tablet Take 1 tablet (20 mg total) by mouth daily. 5 tablet Flossie Dibble, NP      PDMP not reviewed this encounter.   Flossie Dibble, NP 06/17/22 1728    Flossie Dibble, NP 06/17/22 1729

## 2022-06-17 NOTE — Telephone Encounter (Signed)
  Chief Complaint: Flu exposure and has a sore throat, coughing and body aches.   Pt at the urgent care now.  Symptoms: above Frequency: Since right after Christmas.   Exposed over Christmas to 2 family members with the flu. Pertinent Negatives: Patient denies N/A Disposition: '[]'$ ED /'[x]'$ Urgent Care (no appt availability in office) / '[]'$ Appointment(In office/virtual)/ '[]'$  [facility]/ '[]'$ Home Care/ '[]'$ Refused Recommended Disposition /'[]'$  Mobile Bus/ '[]'$  Follow-up with PCP Additional Notes: Pt at the urgent care now when I returned her call.   Encouraged her to stay there and be evaluated since no appts. At [facility].

## 2022-06-17 NOTE — Discharge Instructions (Addendum)
Albuterol inhaler was given to you in office, you may use this every 6 hours as needed. Tessalon has been sent to the pharmacy for cough, you can use this medication every 8 hours as needed. Promethazine DM has been sent to the pharmacy, you can use this medication before bedtime, please do not operate any heavy machinery or drive a car after taking this medication. Prednisone is a steroid, this medication has been sent to the pharmacy, you will take 1 tablets for the next 5 mornings.    If symptoms are not improving you can follow-up with this office or with your PCP.   If you develop any severe/concerning symptoms at home please go to the nearest emergency department; such as worsening shortness of breath, chest pain, worsening lightheadedness.

## 2022-06-18 ENCOUNTER — Ambulatory Visit: Payer: Self-pay

## 2022-06-18 NOTE — Telephone Encounter (Signed)
  Chief Complaint: cough rx change Symptoms: constant coughing with congestion  Frequency: ongoing 2 weeks  Pertinent Negatives: NA Disposition: '[]'$ ED /'[]'$ Urgent Care (no appt availability in office) / '[]'$ Appointment(In office/virtual)/ '[]'$  [facility]/ '[]'$ Home Care/ '[]'$ Refused Recommended Disposition /'[]'$ [facility]/ '[x]'$  Follow-up with PCP Additional Notes: pt had UC visit yesterday and pt states pharmacy told her she can be prescribed cough syrup with codeine. Advised pt I would send message to provider. Pt would like CB if possible if rx is sent in.   Summary: Pt requests a stronger cough medication with codeine   Pt reports that she was told by her pharmacy to call to request a stronger cough medication with codeine because it has been 2 weeks and she still has the cough. Cb# [phone]         Reason for Disposition  [1] Caller has URGENT medicine question about med that PCP or specialist prescribed AND [2] triager unable to answer question  Answer Assessment - Initial Assessment Questions 1. NAME of MEDICINE: "What medicine(s) are you calling about?"     Cough syrup  2. QUESTION: "What is your question?" (e.g., double dose of medicine, side effect)     Wanting alternative with codeine in it  3. PRESCRIBER: "Who prescribed the medicine?" Reason: if prescribed by specialist, call should be referred to that group.     Went to UC  4. SYMPTOMS: "Do you have any symptoms?" If Yes, ask: "What symptoms are you having?"  "How bad are the symptoms (e.g., mild, moderate, severe)     Cough with congestion  Protocols used: Medication Question Call-A-AH

## 2022-06-18 NOTE — Telephone Encounter (Signed)
Patient scheduled appointment with Sylvester Harder NP on 06/21/2022.  In the meantime patient advised that of s/s worsen to go to ED or return to UC.

## 2022-06-21 ENCOUNTER — Other Ambulatory Visit: Payer: Self-pay

## 2022-06-21 ENCOUNTER — Ambulatory Visit (INDEPENDENT_AMBULATORY_CARE_PROVIDER_SITE_OTHER): Payer: Commercial Managed Care - HMO | Admitting: Primary Care

## 2022-06-21 DIAGNOSIS — J989 Respiratory disorder, unspecified: Secondary | ICD-10-CM | POA: Diagnosis not present

## 2022-06-21 MED ORDER — HYDROCOD POLI-CHLORPHE POLI ER 10-8 MG/5ML PO SUER
5.0000 mL | Freq: Two times a day (BID) | ORAL | 0 refills | Status: DC | PRN
  Filled 2022-06-21: qty 70, 7d supply, fill #0

## 2022-06-21 NOTE — Progress Notes (Unsigned)
[place]  Virtual Visit via Telephone Note  I connected with CHANIA KOCHANSKI, on 06/21/2022 at 3:01 PM ***through an audio and video application or by ***telephone and verified that I am speaking with the correct person using two identifiers.   Consent: I discussed the limitations, risks, security and privacy concerns of performing an evaluation and management service by telephone and the availability of in person appointments. I also discussed with the patient that there may be a patient responsible charge related to this service. The patient expressed understanding and agreed to proceed.   Location of Patient: ***  Location of Provider: [facility] [place]   Persons participating in Telemedicine visit: Anira K Oran Rein,  NP   History of Present Illness: ***   Past Medical History:  Diagnosis Date   Anemia    CVA (cerebral vascular accident) ([place])    2020   Epilepsy ([place])    Fibroids    H/O bacterial infection    H/O mumps    Hyperlipidemia    Hypertension    Seizures ([place])    No Known Allergies  Current Outpatient Medications on File Prior to Visit  Medication Sig Dispense Refill   atorvastatin (LIPITOR) 80 MG tablet Take 1 tablet (80 mg total) by mouth daily. 30 tablet 6   benzonatate (TESSALON) 100 MG capsule Take 1 capsule (100 mg total) by mouth every 8 (eight) hours. 24 capsule 0   chlorthalidone (HYGROTON) 25 MG tablet Take 1 tablet (25 mg total) by mouth daily. 90 tablet 0   clopidogrel (PLAVIX) 75 MG tablet Take 1 tablet (75 mg total) by mouth daily. 90 tablet 1   donepezil (ARICEPT) 10 MG tablet take 1 tablet by mouth daily 30 tablet 11   famotidine (PEPCID) 20 MG tablet Take 2 tablets (40 mg total) by mouth daily. 180 tablet 0   FLUoxetine (PROZAC) 20 MG capsule Take 1 capsule (20 mg total) by mouth daily. 30 capsule 3   gabapentin (NEURONTIN) 300 MG capsule Take 1 capsule (300 mg  total) by mouth 3 (three) times daily. 90 capsule 3   hydrOXYzine (ATARAX) 10 MG tablet Take 1 tablet (10 mg total) by mouth 3 (three) times daily as needed. 60 tablet 1   lidocaine (LIDODERM) 5 % Place 1 patch onto the skin daily. Remove & Discard patch within 12 hours or as directed by MD 30 patch 3   losartan (COZAAR) 25 MG tablet Take 1 tablet (25 mg total) by mouth daily 90 tablet 2   metoprolol tartrate (LOPRESSOR) 50 MG tablet Take 1 tablet (50 mg total) by mouth 2 (two) times daily. 180 tablet 1   oxybutynin (DITROPAN XL) 5 MG 24 hr tablet Take 1 tablet (5 mg total) by mouth at bedtime. For overactive bladder 30 tablet 1   predniSONE (DELTASONE) 20 MG tablet Take 1 tablet (20 mg total) by mouth daily. 5 tablet 0   promethazine-dextromethorphan (PROMETHAZINE-DM) 6.25-15 MG/5ML syrup Take 5 mLs by mouth at bedtime. 118 mL 0   No current facility-administered medications on file prior to visit.    Observations/Objective: ***  Assessment and Plan: ***  Follow Up Instructions: ***   I discussed the assessment and treatment plan with the patient. The patient was provided an opportunity to ask questions and all were answered. The patient agreed with the plan and demonstrated an understanding of the instructions.   The patient was advised to call back or seek an  in-person evaluation if the symptoms worsen or if the condition fails to improve as anticipated.     I provided *** minutes total of non-face-to-face time during this encounter including median intraservice time, reviewing previous notes, investigations, ordering medications, medical decision making, coordinating care and patient verbalized understanding at the end of the visit.    This note has been created with Surveyor, quantity. Any transcriptional errors are unintentional.   Kerin Perna, NP 06/21/2022, 3:01 PM

## 2022-06-22 ENCOUNTER — Other Ambulatory Visit: Payer: Self-pay

## 2022-06-22 LAB — COVID-19, FLU A+B AND RSV
Influenza A, NAA: NOT DETECTED
Influenza B, NAA: NOT DETECTED
RSV, NAA: NOT DETECTED
SARS-CoV-2, NAA: NOT DETECTED

## 2022-06-23 ENCOUNTER — Other Ambulatory Visit: Payer: Self-pay

## 2022-06-29 ENCOUNTER — Ambulatory Visit: Payer: Commercial Managed Care - HMO | Admitting: Podiatry

## 2022-07-11 NOTE — Progress Notes (Unsigned)
HPI Tina Moss is a 52 y.o. year old female  who  has a past medical history of Anemia, CVA (cerebral vascular accident) ([place]), Epilepsy ([place]), Fibroids, H/O bacterial infection, H/O mumps, Hyperlipidemia, Hypertension, and Seizures ([place]).   They are presenting to PM&R clinic as a new patient for pain management evaluation. They were referred by Dr. Margarita Rana for treatment of thoracolumbar back pain.   Per her last note: "She complains of her body being in pain. She Complains of a 'big giant belt around her abdomen radiating to her back'. It comes straight down her back, her legs are weak and she feels like she is about to fall. This has been present for 1 month. She has not had any recent falls but states her right knee feels like it will give put. " ANA, CCP, dsDNA Ab, and MM panels negative.   Recent imaging: none  Source: Low back, bilateral Inciting incident: none; endorses started to get worse in her 9s.  Description of pain: intermittent and aching, +numbness in her legs into her feet; lasts about 15-30 minutes Severity: On average 9 /10. At worst  9/10. At best  9/10. Exacerbating factors: sitting for long periods Remitting factors: lying down and using a pillow; moving around, midday Red flag symptoms: No red flags for back pain endorsed in Hx or ROS  Medications tried: Topical medications (mild effect) : Lidocaine patches, a gel that is "green and cold" that would freeze the pain Nsaids (unsure of effect) : Ibuprofen; "I just go to sleep" Tylenol  (never tried) : Was told not to take it by a doctor; unsure why.  Opiates  (unsure of effect) : Tramadol 50 mg; says "I don't know what it does, I take my medications all at one time." Got hydrocodone cough syrup on 1/9; improving but still coughing at nighttime.  Gabapentin / Lyrica  (moderate effect) : Gabapentin 300 mg TID TCAs  (never tried) :  SNRIs  (cannot tolerate) : Cymbalta, made her feel "weird"; "it made me feel like my  body was going to explode, and my mind was racing".  Other  (unsure of effect) : On prednisone 20 mg for URI; unsure of effect  Other treatments: PT/OT  (made pain worse) : Tried without benefit, believes she was in a few months ago. Did leg exercises and core stability exercise.  Accupuncture/chiropractor/massage  (never tried)  TENs unit (moderate effect) : Did many years ago, does not currently have a machine.  Injections (never tried) :  Surgery (never tried) :  Other  (none) : "I'm supposed to see a psychiatrist, but I don't know what happened with that. Things are getting worse". Was referred to one place that didn't accept her, Neurology placed referral but she never talked to them.   Goals for pain control: "Hold my grandbaby; I can't pick her up like I want to". She is turning 1 in June.   Prior UDS results: UDS 2015 + Benzodiazepines, THC; states she hasn't had marijuana in "a very long time", endorses she was on a Benzo for anxiety.     Component Value Date/Time   LABOPIA NONE DETECTED 04/04/2018 1258   COCAINSCRNUR NONE DETECTED 04/04/2018 1258   LABBENZ NONE DETECTED 04/04/2018 1258   AMPHETMU NONE DETECTED 04/04/2018 1258   THCU NONE DETECTED 04/04/2018 1258   LABBARB NONE DETECTED 04/04/2018 1258      Pain Inventory Average Pain 9 Pain Right Now 8 My pain is sharp, stabbing, and aching  In the last 24 hours, has pain interfered with the following? General activity 8 Relation with others 8 Enjoyment of life 8 What TIME of day is your pain at its worst? morning , daytime, and night Sleep (in general) Poor  Pain is worse with: walking, sitting, and standing Pain improves with: heat/ice, therapy/exercise, and medication Relief from Meds: 6  walk without assistance how many minutes can you walk? Not long ability to climb steps?  yes do you drive?  yes  not employed: date last employed . I need assistance with the following:  dressing, bathing, household  duties, and shopping  bladder control problems weakness numbness dizziness confusion depression anxiety  New pt  New pt    Family History  Problem Relation Age of Onset   Hypertension Mother    Hypertension Sister    Arthritis Sister        knees   Hypertension Brother    Deafness Brother    Speech disorder Brother        mute   Mental illness Brother        "he can just fly off"   Mental illness Daughter        ? bipolar   Scoliosis Son    Social History   Socioeconomic History   Marital status: Divorced    Spouse name: n/a   Number of children: 2   Years of education: 11th grade   Highest education level: 11th grade  Occupational History   Occupation: Housekeeper    Comment: [employer]  Tobacco Use   Smoking status: Some Days    Types: Cigars   Smokeless tobacco: Never   Tobacco comments:    "I think I just keep so much on my mind."  Vaping Use   Vaping Use: Never used  Substance and Sexual Activity   Alcohol use: Yes    Alcohol/week: 0.0 - 3.0 standard drinks of alcohol    Comment: social   Drug use: Not Currently    Types: Marijuana    Comment: occasionally   Sexual activity: Yes    Partners: Male    Birth control/protection: None  Other Topics Concern   Not on file  Social History Narrative   Lives with her daughter.   Son is currently incarcerated in [place].   She completed 11th grade, but was kicked out and never when back, as she had become pregnant and was raising her daughter.   Divorced x 2.   Right handed   Drinks caffeine   One story home   Social Determinants of Health   Financial Resource Strain: Not on file  Food Insecurity: Not on file  Transportation Needs: Unmet Transportation Needs (08/28/2019)   PRAPARE - Hydrologist (Medical): Yes    Lack of Transportation (Non-Medical): Yes  Physical Activity: Not on file  Stress: Not on file  Social Connections: Not on file   Past Surgical History:   Procedure Laterality Date   CESAREAN SECTION     x2. at term   TEE WITHOUT CARDIOVERSION N/A 04/06/2018   Procedure: TRANSESOPHAGEAL ECHOCARDIOGRAM (TEE) BUBBLE STUDY;  Surgeon: Lelon Perla, MD;  Location: [facility] ENDOSCOPY;  Service: Cardiovascular;  Laterality: N/A;   TUBAL LIGATION     WISDOM TOOTH EXTRACTION     Past Medical History:  Diagnosis Date   Anemia    CVA (cerebral vascular accident) ([place])    2020   Epilepsy ([place])    Fibroids  H/O bacterial infection    H/O mumps    Hyperlipidemia    Hypertension    Seizures ([facility])    BP (!) 158/96   Pulse (!) 111   Ht '5\' 7"'$  (1.702 m)   Wt 199 lb 6.4 oz (90.4 kg)   LMP  (LMP Unknown) Comment: more than a year since last menstrual  SpO2 96%   BMI 31.23 kg/m   Opioid Risk Score:   Fall Risk Score:  `1  Depression screen PHQ 2/9     05/17/2022    3:00 PM 03/16/2022    1:55 PM 12/28/2021    9:12 AM 10/15/2021   10:29 AM 10/06/2021    9:44 AM 09/09/2021   10:23 AM 09/02/2021    2:39 PM  Depression screen PHQ 2/9  Decreased Interest '3 3 3 3 3 3 3  '$ Down, Depressed, Hopeless '3 3 3 3 2 3 2  '$ PHQ - 2 Score '6 6 6 6 5 6 5  '$ Altered sleeping '3 2 3 2 3 3 3  '$ Tired, decreased energy '3 3 3 3 3 3 3  '$ Change in appetite '3 3 3 2 2 2 2  '$ Feeling bad or failure about yourself  '3 3 3 3 2 2 2  '$ Trouble concentrating '3 3 3 2 3 2 3  '$ Moving slowly or fidgety/restless '3 3 3 2 2 3 3  '$ Suicidal thoughts 0 0 0 0 0 0 0  PHQ-9 Score '24 23 24 20 20 21 21    '$ ROS: Review of Systems  Constitutional:  Positive for diaphoresis.  Respiratory:  Positive for shortness of breath and wheezing.   Musculoskeletal:  Positive for back pain.  Neurological:  Positive for seizures and weakness.       Numbness  Psychiatric/Behavioral:  Positive for depression.   All other systems reviewed and are negative.  PE: Constitution: Appropriate appearance for age. No apparent distress  +Obese HEENT: PERRL, EOMI grossly intact.  Resp: CTAB. No rales, rhonchi, or  wheezing. Cardio: RRR. No mumurs, rubs, or gallops. 1+ peripheral edema. Abdomen: Nondistended. Nontender. +bowel sounds. Psych: Appropriate mood and affect. Neuro: AAOx4.    Neurologic Exam:   DTRs: Reflexes were 2+ in bilateral achilles, patella, biceps, BR and triceps. Babinsky: flexor responses b/l.   Hoffmans: negative b/l Sensory exam: revealed normal sensation in all dermatomal regions in bilateral upper extremities, bilateral lower extremities, right upper extremity, right lower extremity, and with reduced sensation to light touch in left fingers and right medial malleolus Motor exam: strength 5/5 throughout bilateral upper extremities and bilateral lower extremities Coordination: Fine motor coordination was normal.   Gait: +antalgic gait; increased lumbar lordosis, genu varum, poor base of balance  Back Exam:   Inspection: Pelvis was even .  Lumbar lordotic curvature was  increased .  There was no evidence of scoliosis.  Palpation: Palpatory exam revealed ttp at the R>L lumbar paraspinals, R PSIS, R SI joint, L GTB . There was no evidence of spasm. No trigger points were noted.     Special/provocative testing:    SLR: negative   Slump test: negative   Facet loading: +(very non-specific)   TTP at paraspinals: + (sensitive for facet pain...if no ttp then likely not facet pain)   Corky Sox test: + b/l groin, low back pain   FAIR test: + b/l thigh pain   Assessment and Plan: KATALENA MALVEAUX is a 52 y.o. year old female  who  has a past medical history of Anemia,  CVA (cerebral vascular accident) ([place]), Epilepsy ([place]), Fibroids, H/O bacterial infection, H/O mumps, Hyperlipidemia, Hypertension, and Seizures ([place]).   They are presenting to PM&R clinic as a new patient for treatment of low back pain .   Chronic pain syndrome Assessment & Plan: Multimodal, with components of myofascial, facet arthopathy, radicular pains  Given Hx +THC, will hold of on controlled substance medications  for now. Patient is unsure if Tramadol is beneficial, and would avoid due to ?Hx liver enzyme elevation and Hx seizures.   Will increase gabapentin as below, can consider switch to Lyrica in the future if no improvement. Once imaging is obtained, will discuss possible injections. No red flags for surgical referral.   Orders: -     Ambulatory referral to Physical Therapy -     DG Lumbar Spine 2-3 Views; Future  Other depression Assessment & Plan: I will reach out to your PCP/Neurologist regarding follow up for psychiatry referrals    History of Left middle cerebral artery stroke Assessment & Plan: In the future, we can consider neuropsych evaluation for memory deficits and cognition once depression is better controlled    Chronic bilateral low back pain with sciatica, sciatica laterality unspecified Assessment & Plan: Increase Gabapentin to 600 mg BID  Obtain Lumbar xrays to evaluate; I will call once results are available, we will likely need MRI for radicular pains in the future  Referral to Aquatherapy for gentle mobilization and pain control given significant deconditioning  Follow up in 2 months  Orders: -     Ambulatory referral to Physical Therapy -     DG Lumbar Spine 2-3 Views; Future  Other orders -     Gabapentin; Take 2 capsules (600 mg total) by mouth 2 (two) times daily.  Dispense: 60 capsule; Refill: [place], DO 07/12/2022

## 2022-07-12 ENCOUNTER — Encounter
Payer: Commercial Managed Care - HMO | Attending: Physical Medicine and Rehabilitation | Admitting: Physical Medicine and Rehabilitation

## 2022-07-12 ENCOUNTER — Other Ambulatory Visit: Payer: Self-pay

## 2022-07-12 VITALS — BP 158/96 | HR 111 | Ht 67.0 in | Wt 199.4 lb

## 2022-07-12 DIAGNOSIS — F3289 Other specified depressive episodes: Secondary | ICD-10-CM | POA: Diagnosis not present

## 2022-07-12 DIAGNOSIS — Z8673 Personal history of transient ischemic attack (TIA), and cerebral infarction without residual deficits: Secondary | ICD-10-CM | POA: Insufficient documentation

## 2022-07-12 DIAGNOSIS — M5440 Lumbago with sciatica, unspecified side: Secondary | ICD-10-CM | POA: Diagnosis not present

## 2022-07-12 DIAGNOSIS — G894 Chronic pain syndrome: Secondary | ICD-10-CM | POA: Diagnosis not present

## 2022-07-12 DIAGNOSIS — G8929 Other chronic pain: Secondary | ICD-10-CM | POA: Insufficient documentation

## 2022-07-12 MED ORDER — GABAPENTIN 300 MG PO CAPS
600.0000 mg | ORAL_CAPSULE | Freq: Two times a day (BID) | ORAL | 5 refills | Status: DC
  Filled 2022-07-12: qty 60, 15d supply, fill #0
  Filled 2022-08-01 – 2022-08-10 (×2): qty 60, 15d supply, fill #1
  Filled 2022-10-28 – 2022-11-12 (×2): qty 60, 15d supply, fill #2

## 2022-07-12 NOTE — Assessment & Plan Note (Addendum)
Multimodal, with components of myofascial, facet arthopathy, radicular pains  Given Hx +THC, will hold of on controlled substance medications for now. Patient is unsure if Tramadol is beneficial, and would avoid due to ?Hx liver enzyme elevation and Hx seizures.   Will increase gabapentin as below, can consider switch to Lyrica in the future if no improvement. Once imaging is obtained, will discuss possible injections. No red flags for surgical referral.

## 2022-07-12 NOTE — Assessment & Plan Note (Addendum)
I will reach out to your PCP/Neurologist regarding follow up for psychiatry referrals

## 2022-07-12 NOTE — Assessment & Plan Note (Signed)
In the future, we can consider neuropsych evaluation for memory deficits and cognition once depression is better controlled

## 2022-07-12 NOTE — Assessment & Plan Note (Signed)
Increase Gabapentin to 600 mg BID  Obtain Lumbar xrays to evaluate; I will call once results are available, we will likely need MRI for radicular pains in the future  Referral to Aquatherapy for gentle mobilization and pain control given significant deconditioning  Follow up in 2 months

## 2022-07-12 NOTE — Patient Instructions (Signed)
  Other depression Assessment & Plan: I will reach out to your PCP/Neurologist regarding follow up for psychiatry referrals    History of Left middle cerebral artery stroke Assessment & Plan: In the future, we can consider neuropsych evaluation for memory deficits and cognition once depression is better controlled    Chronic bilateral low back pain with sciatica, sciatica laterality unspecified Assessment & Plan: Increase Gabapentin to 600 mg BID  Obtain Lumbar xrays to evaluate; I will call once results are available, we will likely need MRI for radicular pains in the future  Referral to Aquatherapy for gentle mobilization and pain control given significant deconditioning  Follow up in 2 months

## 2022-07-13 ENCOUNTER — Ambulatory Visit
Admission: RE | Admit: 2022-07-13 | Discharge: 2022-07-13 | Disposition: A | Discharge status: Home / Self Care | Payer: Commercial Managed Care - HMO | Source: Ambulatory Visit | Attending: Family Medicine | Admitting: Family Medicine

## 2022-07-13 DIAGNOSIS — Z1231 Encounter for screening mammogram for malignant neoplasm of breast: Secondary | ICD-10-CM

## 2022-07-15 ENCOUNTER — Ambulatory Visit: Payer: Commercial Managed Care - HMO

## 2022-07-27 ENCOUNTER — Other Ambulatory Visit: Payer: Self-pay

## 2022-07-27 NOTE — Progress Notes (Signed)
Patient outreached by Levi Aland, PharmD Candidate on 07/26/22 to discuss hypertension   Patient has an automated home blood pressure machine. They report no home readings currently as they have forgotten for some time to do them. Recommended that she begin to take BP readings as often as she can remember with the ideal being once daily. She reports some instances of dizziness/lightheadedness. After some questions about it she reports that it happens mainly when she goes from laying down to sitting up/standing. Advised her that sometimes this could cause our BP to drop and to make sure that she is going slowly and not immediately trying to walk when she stands after resting for some time. Encouraged to take BP in light of this to see if her BP is lower more often than not as it will help her PCP adjust her medications.    Medication review was performed. They are taking medications as prescribed. Pt endorses taking their medication as prescribed and only misses doses occasionally as she forgets and sometimes cannot get to the pharmacy in time to get her meds. Advised her strategies to help her remember to take her medicine (alarms/pairing with routine activity as she already uses pill boxes) and to contact her pharmacy to see if delivery is an option for her.   The following barriers to adherence were noted:  - They do not have cost concerns.  - They do have transportation concerns.  - They do not need assistance obtaining refills.  - They do occasionally forget to take some of their prescribed medications.  - They do not feel like one/some of their medications make them feel poorly.  - They do not have questions or concerns about their medications.  - They do have follow up scheduled with their primary care provider/cardiologist.   The following interventions were completed:  - Medications were reviewed  - Patient was educated on proper technique to check home blood pressure and reminded to  bring home machine and readings to next provider appointment  - Patient was educated on medications, including indication and administration  - Patient was educated on use of adherence strategies, like a pill box or alarms  - Patient was advised on how to ask their pharmacy for delivery  - Patient was educated on orthostatic hypotension   The patient has follow up scheduled: 08/16/22  PCP: Angelena Sole, PharmD Candidate, Class of 2024  [phone]  [place], [place].D. PGY-2 Ambulatory Care Pharmacy Resident 07/27/2022 9:32 AM

## 2022-08-01 ENCOUNTER — Other Ambulatory Visit: Payer: Self-pay | Admitting: Family Medicine

## 2022-08-01 DIAGNOSIS — I10 Essential (primary) hypertension: Secondary | ICD-10-CM

## 2022-08-02 ENCOUNTER — Other Ambulatory Visit (HOSPITAL_COMMUNITY): Payer: Self-pay

## 2022-08-02 ENCOUNTER — Other Ambulatory Visit: Payer: Self-pay

## 2022-08-02 MED ORDER — LOSARTAN POTASSIUM 25 MG PO TABS
25.0000 mg | ORAL_TABLET | Freq: Every day | ORAL | 0 refills | Status: DC
  Filled 2022-08-02 – 2022-08-10 (×2): qty 90, 90d supply, fill #0

## 2022-08-02 NOTE — Telephone Encounter (Signed)
Appointment 08/16/22 Requested Prescriptions  Pending Prescriptions Disp Refills   losartan (COZAAR) 25 MG tablet 90 tablet 0    Sig: Take 1 tablet (25 mg total) by mouth daily     Cardiovascular:  Angiotensin Receptor Blockers Failed - 08/01/2022  9:15 AM      Failed - Cr in normal range and within 180 days    Creatinine, Ser  Date Value Ref Range Status  01/25/2022 0.75 0.57 - 1.00 mg/dL Final         Failed - K in normal range and within 180 days    Potassium  Date Value Ref Range Status  01/25/2022 3.9 3.5 - 5.2 mmol/L Final         Failed - Last BP in normal range    BP Readings from Last 1 Encounters:  07/12/22 (!) 158/96         Passed - Patient is not pregnant      Passed - Valid encounter within last 6 months    Recent Outpatient Visits           1 month ago Respiratory complication   [place] [facility] Kerin Perna, NP   2 months ago Prediabetes   [place], Enobong, MD   4 months ago Overactive bladder   [place] Gildardo Pounds, NP   5 months ago Viral URI with cough   [place], Vernia Buff, NP   7 months ago Other chest pain   [place], Enobong, MD       Future Appointments             In 2 weeks Charlott Rakes, MD [place]

## 2022-08-03 ENCOUNTER — Other Ambulatory Visit: Payer: Self-pay

## 2022-08-03 ENCOUNTER — Other Ambulatory Visit (HOSPITAL_COMMUNITY): Payer: Self-pay

## 2022-08-05 ENCOUNTER — Other Ambulatory Visit (HOSPITAL_COMMUNITY): Payer: Self-pay

## 2022-08-05 ENCOUNTER — Encounter (HOSPITAL_COMMUNITY): Payer: Self-pay

## 2022-08-09 ENCOUNTER — Other Ambulatory Visit: Payer: Self-pay

## 2022-08-10 ENCOUNTER — Other Ambulatory Visit: Payer: Self-pay

## 2022-08-13 ENCOUNTER — Other Ambulatory Visit: Payer: Self-pay

## 2022-08-16 ENCOUNTER — Encounter: Payer: Self-pay | Admitting: Family Medicine

## 2022-08-16 ENCOUNTER — Ambulatory Visit: Payer: Commercial Managed Care - HMO | Attending: Family Medicine | Admitting: Family Medicine

## 2022-08-16 ENCOUNTER — Other Ambulatory Visit: Payer: Self-pay

## 2022-08-16 ENCOUNTER — Ambulatory Visit
Admission: RE | Admit: 2022-08-16 | Discharge: 2022-08-16 | Disposition: A | Discharge status: Home / Self Care | Payer: Commercial Managed Care - HMO | Source: Ambulatory Visit | Attending: Physical Medicine and Rehabilitation | Admitting: Physical Medicine and Rehabilitation

## 2022-08-16 VITALS — BP 142/96 | HR 96 | Ht 67.0 in | Wt 198.0 lb

## 2022-08-16 DIAGNOSIS — M5441 Lumbago with sciatica, right side: Secondary | ICD-10-CM

## 2022-08-16 DIAGNOSIS — M25562 Pain in left knee: Secondary | ICD-10-CM

## 2022-08-16 DIAGNOSIS — M25561 Pain in right knee: Secondary | ICD-10-CM

## 2022-08-16 DIAGNOSIS — R053 Chronic cough: Secondary | ICD-10-CM

## 2022-08-16 DIAGNOSIS — G894 Chronic pain syndrome: Secondary | ICD-10-CM

## 2022-08-16 DIAGNOSIS — N951 Menopausal and female climacteric states: Secondary | ICD-10-CM

## 2022-08-16 DIAGNOSIS — F419 Anxiety disorder, unspecified: Secondary | ICD-10-CM

## 2022-08-16 DIAGNOSIS — G8929 Other chronic pain: Secondary | ICD-10-CM

## 2022-08-16 DIAGNOSIS — F32A Depression, unspecified: Secondary | ICD-10-CM

## 2022-08-16 DIAGNOSIS — M5442 Lumbago with sciatica, left side: Secondary | ICD-10-CM

## 2022-08-16 MED ORDER — FLUTICASONE PROPIONATE 50 MCG/ACT NA SUSP
2.0000 | Freq: Every day | NASAL | 6 refills | Status: DC
  Filled 2022-08-16: qty 16, 30d supply, fill #0
  Filled 2022-10-28: qty 16, 30d supply, fill #1

## 2022-08-16 MED ORDER — CETIRIZINE HCL 10 MG PO TABS
10.0000 mg | ORAL_TABLET | Freq: Every day | ORAL | 1 refills | Status: DC
  Filled 2022-08-16 – 2022-11-12 (×3): qty 30, 30d supply, fill #0
  Filled 2023-04-18: qty 30, 30d supply, fill #1

## 2022-08-16 MED ORDER — VEOZAH 45 MG PO TABS
45.0000 mg | ORAL_TABLET | Freq: Every day | ORAL | 1 refills | Status: DC
  Filled 2022-08-16 – 2022-10-28 (×2): qty 90, 90d supply, fill #0

## 2022-08-16 MED ORDER — HYDROXYZINE HCL 10 MG PO TABS
10.0000 mg | ORAL_TABLET | Freq: Three times a day (TID) | ORAL | 1 refills | Status: DC | PRN
  Filled 2022-08-16: qty 60, 20d supply, fill #0

## 2022-08-16 NOTE — Progress Notes (Signed)
Subjective:  Patient ID: Tina Moss, female    DOB: 1970/10/27  Age: 52 y.o. MRN: [id number]  CC: Joint Pain   HPI Tina Moss is a 52 y.o. year old female with a history of seizures (seizure free for the last 5 years and is not currently on antiseizure medication), multiple sclerosis, L MCA CVA in 03/2017 .    Interval History: Today she presents for follow-up of arthralgias.  I had referred her to physical medicine and rehab due to her chronic pain. She was seen by physical medicine and rehab last month for chronic pain and pain notes due to history of THC use holding off on controlled substances.  Gabapentin dose was increased with plans to change to Lyrica if symptoms persist. Both knee have also been hurting and this is not exacerbated by anything. Pain occurs in popliteal fossa when lying supine and in anterior knee when she is sitting up.  Right knee x-ray in 10/2021 revealed degenerative changes.  She Complains of hot flashes which have been present for 2 weeks and occur during the day and night time  She complains of persisting cough for the last 2 months ever since she had a URI and has tried several antitussives but still has yellowish phlegm. Symptoms are worse at night. CXR in 06/2022. She smokes every Black and mouse cigarettes 2-3 days. Complains of stresses which cause her to continue to smoke.  She is on hydroxyzine and Prozac and had been referred to LCSW for counseling.  Past Medical History:  Diagnosis Date   Anemia    CVA (cerebral vascular accident) ([place])    2020   Epilepsy ([place])    Fibroids    H/O bacterial infection    H/O mumps    Hyperlipidemia    Hypertension    Seizures ([place])     Past Surgical History:  Procedure Laterality Date   CESAREAN SECTION     x2. at term   TEE WITHOUT CARDIOVERSION N/A 04/06/2018   Procedure: TRANSESOPHAGEAL ECHOCARDIOGRAM (TEE) BUBBLE STUDY;  Surgeon: Lelon Perla, MD;  Location: [facility] ENDOSCOPY;  Service:  Cardiovascular;  Laterality: N/A;   TUBAL LIGATION     WISDOM TOOTH EXTRACTION      Family History  Problem Relation Age of Onset   Hypertension Mother    Hypertension Sister    Arthritis Sister        knees   Mental illness Daughter        ? bipolar   Hypertension Brother    Deafness Brother    Speech disorder Brother        mute   Mental illness Brother        "he can just fly off"   Scoliosis Son    Breast cancer Neg Hx     Social History   Socioeconomic History   Marital status: Divorced    Spouse name: n/a   Number of children: 2   Years of education: 11th grade   Highest education level: 11th grade  Occupational History   Occupation: Housekeeper    Comment: [employer]  Tobacco Use   Smoking status: Some Days    Types: Cigars   Smokeless tobacco: Never   Tobacco comments:    "I think I just keep so much on my mind."  Vaping Use   Vaping Use: Never used  Substance and Sexual Activity   Alcohol use: Yes    Alcohol/week: 0.0 - 3.0 standard drinks of alcohol  Comment: social   Drug use: Not Currently    Types: Marijuana    Comment: occasionally   Sexual activity: Yes    Partners: Male    Birth control/protection: None  Other Topics Concern   Not on file  Social History Narrative   Lives with her daughter.   Son is currently incarcerated in [place].   She completed 11th grade, but was kicked out and never when back, as she had become pregnant and was raising her daughter.   Divorced x 2.   Right handed   Drinks caffeine   One story home   Social Determinants of Health   Financial Resource Strain: Not on file  Food Insecurity: Not on file  Transportation Needs: Unmet Transportation Needs (08/28/2019)   PRAPARE - Hydrologist (Medical): Yes    Lack of Transportation (Non-Medical): Yes  Physical Activity: Not on file  Stress: Not on file  Social Connections: Not on file    No Known Allergies  Outpatient Medications Prior  to Visit  Medication Sig Dispense Refill   atorvastatin (LIPITOR) 80 MG tablet Take 1 tablet (80 mg total) by mouth daily. 30 tablet 6   chlorthalidone (HYGROTON) 25 MG tablet Take 1 tablet (25 mg total) by mouth daily. 90 tablet 0   clopidogrel (PLAVIX) 75 MG tablet Take 1 tablet (75 mg total) by mouth daily. 90 tablet 1   donepezil (ARICEPT) 10 MG tablet take 1 tablet by mouth daily 30 tablet 11   famotidine (PEPCID) 20 MG tablet Take 2 tablets (40 mg total) by mouth daily. 180 tablet 0   FLUoxetine (PROZAC) 20 MG capsule Take 1 capsule (20 mg total) by mouth daily. 30 capsule 3   gabapentin (NEURONTIN) 300 MG capsule Take 2 capsules (600 mg total) by mouth 2 (two) times daily. 60 capsule 5   losartan (COZAAR) 25 MG tablet Take 1 tablet (25 mg total) by mouth daily 90 tablet 0   metoprolol tartrate (LOPRESSOR) 50 MG tablet Take 1 tablet (50 mg total) by mouth 2 (two) times daily. 180 tablet 1   oxybutynin (DITROPAN XL) 5 MG 24 hr tablet Take 1 tablet (5 mg total) by mouth at bedtime. For overactive bladder 30 tablet 1   hydrOXYzine (ATARAX) 10 MG tablet Take 1 tablet (10 mg total) by mouth 3 (three) times daily as needed. 60 tablet 1   benzonatate (TESSALON) 100 MG capsule Take 1 capsule (100 mg total) by mouth every 8 (eight) hours. (Patient not taking: Reported on 08/16/2022) 24 capsule 0   chlorpheniramine-HYDROcodone (TUSSIONEX) 10-8 MG/5ML Take 5 mLs by mouth every 12 (twelve) hours as needed for cough. (Patient not taking: Reported on 08/16/2022) 70 mL 0   predniSONE (DELTASONE) 20 MG tablet Take 1 tablet (20 mg total) by mouth daily. (Patient not taking: Reported on 08/16/2022) 5 tablet 0   promethazine-dextromethorphan (PROMETHAZINE-DM) 6.25-15 MG/5ML syrup Take 5 mLs by mouth at bedtime. (Patient not taking: Reported on 08/16/2022) 118 mL 0   No facility-administered medications prior to visit.     ROS Review of Systems  Constitutional:  Negative for activity change and appetite change.   HENT:  Negative for sinus pressure and sore throat.   Respiratory:  Negative for chest tightness, shortness of breath and wheezing.   Cardiovascular:  Negative for chest pain and palpitations.  Gastrointestinal:  Negative for abdominal distention, abdominal pain and constipation.  Genitourinary: Negative.   Musculoskeletal: Negative.   Psychiatric/Behavioral:  Negative for behavioral  problems and dysphoric mood.     Objective:  BP (!) 142/96   Pulse 96   Ht '5\' 7"'$  (1.702 m)   Wt 198 lb (89.8 kg)   LMP  (LMP Unknown) Comment: more than a year since last menstrual  SpO2 99%   BMI 31.01 kg/m      08/16/2022   10:05 AM 07/12/2022    9:20 AM 06/17/2022   12:19 PM  BP/Weight  Systolic BP [id number]  Diastolic BP 96 96 82  Wt. (Lbs) 198 199.4   BMI 31.01 kg/m2 31.23 kg/m2       Physical Exam Constitutional:      Appearance: She is well-developed.  Cardiovascular:     Rate and Rhythm: Normal rate.     Heart sounds: Normal heart sounds. No murmur heard. Pulmonary:     Effort: Pulmonary effort is normal.     Breath sounds: Normal breath sounds. No wheezing or rales.  Chest:     Chest wall: No tenderness.  Abdominal:     General: Bowel sounds are normal. There is no distension.     Palpations: Abdomen is soft. There is no mass.     Tenderness: There is no abdominal tenderness.  Musculoskeletal:     Right lower leg: No edema.     Left lower leg: No edema.     Comments: Tenderness on palpation of lumbar spine. Tenderness reproducible when attempting to lay supine Positive bilateral leg raise bilaterally  Neurological:     Mental Status: She is alert and oriented to person, place, and time.  Psychiatric:        Mood and Affect: Mood normal.        Latest Ref Rng & Units 05/24/2022    2:32 PM 01/25/2022    4:49 PM 12/28/2021   11:23 AM  CMP  Glucose 70 - 99 mg/dL  85  96   BUN 6 - 24 mg/dL  13  12   Creatinine 0.57 - 1.00 mg/dL  0.75  0.72   Sodium 134 - 144 mmol/L   139  139   Potassium 3.5 - 5.2 mmol/L  3.9  3.8   Chloride 96 - 106 mmol/L  105  103   CO2 20 - 29 mmol/L  21  23   Calcium 8.7 - 10.2 mg/dL  9.4  9.5   Total Protein 6.0 - 8.5 g/dL 7.5       Lipid Panel     Component Value Date/Time   CHOL 209 (H) 05/24/2022 1432   TRIG 135 05/24/2022 1432   HDL 49 05/24/2022 1432   CHOLHDL 3.5 02/09/2021 1203   CHOLHDL 2.9 04/05/2018 0535   VLDL 13 04/05/2018 0535   LDLCALC 136 (H) 05/24/2022 1432    CBC    Component Value Date/Time   WBC 4.7 10/16/2021 0843   WBC 3.2 (L) 09/10/2021 0923   RBC 4.35 10/16/2021 0843   RBC 4.53 09/10/2021 0923   HGB 13.1 10/16/2021 0843   HCT 39.6 10/16/2021 0843   PLT 247 10/16/2021 0843   MCV 91 10/16/2021 0843   MCH 30.1 10/16/2021 0843   MCH 31.2 04/02/2021 2034   MCHC 33.1 10/16/2021 0843   MCHC 32.7 09/10/2021 0923   RDW 13.2 10/16/2021 0843   LYMPHSABS 1.4 10/16/2021 0843   MONOABS 0.4 04/02/2021 2034   EOSABS 0.0 10/16/2021 0843   BASOSABS 0.0 10/16/2021 0843    Lab Results  Component Value Date   HGBA1C 5.7 05/17/2022  Assessment & Plan:  1. Chronic bilateral low back pain with bilateral sciatica Uncontrolled Gabapentin dose was increased by physical medicine Advised to notify them the symptoms are uncontrolled as the plan was to switch to Lyrica if this occurs Advised to apply heat or ice whichever is tolerated to painful areas. Counseled on evidence of improvement in pain control with regards to yoga, water aerobics, massage, home physical therapy, exercise as tolerated.   2. Chronic pain of both knees Unable to place an NSAID due to the fact that she is on Plavix - Ambulatory referral to Orthopedic Surgery  3. Menopausal vasomotor syndrome - Fezolinetant (VEOZAH) 45 MG TABS; Take 1 tablet (45 mg total) by mouth daily.  Dispense: 90 tablet; Refill: 1  4. Anxiety and depression Uncontrolled due to underlying stressors Continue Prozac LCSW called in for counseling -  hydrOXYzine (ATARAX) 10 MG tablet; Take 1 tablet (10 mg total) by mouth 3 (three) times daily as needed.  Dispense: 60 tablet; Refill: 1  5. Chronic cough Symptoms have been present for 2 months We will need to evaluate for possible COPD given underlying history of smoking Will start with chest x-ray and order PFTs if indicated Sinus etiology is also possible given symptoms are worse at night - DG Chest 2 View; Future - cetirizine (ZYRTEC) 10 MG tablet; Take 1 tablet (10 mg total) by mouth daily.  Dispense: 30 tablet; Refill: 1 - fluticasone (FLONASE) 50 MCG/ACT nasal spray; Place 2 sprays into both nostrils daily.  Dispense: 16 g; Refill: 6    Meds ordered this encounter  Medications   Fezolinetant (VEOZAH) 45 MG TABS    Sig: Take 1 tablet (45 mg total) by mouth daily.    Dispense:  90 tablet    Refill:  1   cetirizine (ZYRTEC) 10 MG tablet    Sig: Take 1 tablet (10 mg total) by mouth daily.    Dispense:  30 tablet    Refill:  1   fluticasone (FLONASE) 50 MCG/ACT nasal spray    Sig: Place 2 sprays into both nostrils daily.    Dispense:  16 g    Refill:  6   hydrOXYzine (ATARAX) 10 MG tablet    Sig: Take 1 tablet (10 mg total) by mouth 3 (three) times daily as needed.    Dispense:  60 tablet    Refill:  1    Follow-up: Return in about 3 months (around 11/16/2022) for Chronic medical conditions.       Charlott Rakes, MD, FAAFP. [facility] [place], [place]   08/16/2022, 1:01 PM

## 2022-08-16 NOTE — Patient Instructions (Signed)

## 2022-08-16 NOTE — Progress Notes (Signed)
Still has cough OTC medications not working. Lower back and knee pain Hot flashes.

## 2022-08-17 ENCOUNTER — Other Ambulatory Visit: Payer: Self-pay

## 2022-08-18 ENCOUNTER — Ambulatory Visit: Payer: Commercial Managed Care - HMO | Admitting: Physician Assistant

## 2022-08-19 ENCOUNTER — Encounter: Payer: Self-pay | Admitting: Physical Medicine and Rehabilitation

## 2022-08-24 ENCOUNTER — Ambulatory Visit: Payer: Commercial Managed Care - HMO | Admitting: Physician Assistant

## 2022-08-30 NOTE — Therapy (Unsigned)
OUTPATIENT PHYSICAL THERAPY THORACOLUMBAR EVALUATION   Patient Name: Tina Moss MRN: [id number] DOB:[birth year], 52 y.o., female Today's Date: 09/01/2022  END OF SESSION:  PT End of Session - 08/31/22 1843     Visit Number 1    Number of Visits 12    Date for PT Re-Evaluation 10/12/22    Authorization Type cigna    PT Start Time 1210    PT Stop Time 1250    PT Time Calculation (min) 40 min    Activity Tolerance Patient tolerated treatment well    Behavior During Therapy WFL for tasks assessed/performed             Past Medical History:  Diagnosis Date   Anemia    CVA (cerebral vascular accident) ([place])    2020   Epilepsy ([place])    Fibroids    H/O bacterial infection    H/O mumps    Hyperlipidemia    Hypertension    Seizures ([place])    Past Surgical History:  Procedure Laterality Date   CESAREAN SECTION     x2. at term   TEE WITHOUT CARDIOVERSION N/A 04/06/2018   Procedure: TRANSESOPHAGEAL ECHOCARDIOGRAM (TEE) BUBBLE STUDY;  Surgeon: Lelon Perla, MD;  Location: [facility] ENDOSCOPY;  Service: Cardiovascular;  Laterality: N/A;   TUBAL LIGATION     WISDOM TOOTH EXTRACTION     Patient Active Problem List   Diagnosis Date Noted   Chronic bilateral low back pain with sciatica 07/12/2022   Chronic pain syndrome 07/12/2022   Prediabetes 10/06/2021   Memory impairment 09/10/2021   History of epilepsy 09/10/2021   Left middle cerebral artery stroke ([place]) 07/17/2020   Vitamin D deficiency 07/17/2020   Anxiety state 07/17/2020   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/2020   Hyperlipidemia LDL goal <100 09/18/2019   Acute pain of right knee 09/17/2019   Hip pain, acute, right 09/17/2019   Abnormal uterine bleeding (AUB) 02/13/2019   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   Tobacco use 04/04/2018   Heart palpitations 12/14/2016   LVH (left ventricular hypertrophy) 12/14/2016   Depression 11/15/2016   Essential hypertension 11/15/2016   Fibroids 01/18/2012    Menorrhagia 01/18/2012   Seizure disorder ([place]) 01/18/2012   Anemia 01/18/2012    PCP: Charlott Rakes,   REFERRING PROVIDER: Gertie Gowda, DO   REFERRING DIAG:  G89.4 (ICD-10-CM) - Chronic pain syndrome  M54.40,G89.29 (ICD-10-CM) - Chronic bilateral low back pain with sciatica, sciatica laterality unspecified    Rationale for Evaluation and Treatment: Rehabilitation  THERAPY DIAG:  Chronic pain syndrome  Chronic bilateral low back pain with sciatica, sciatica laterality unspecified  Muscle weakness (generalized)  Unsteadiness on feet  ONSET DATE: >32yrs  SUBJECTIVE:  SUBJECTIVE STATEMENT: I am afraid of water.  I thought it was a baby pool.  Pain in legs come whenever it wants, feels like a charlie horse happens a few time a week. Had a difficult time walking to pool. Had to take the bus. Will not be able to walk from the bus stop to here sister will have to bring me.  PERTINENT HISTORY:  Referral for aquatherapy. Evaluate and treat. For gentle mobilization for back pain control and increasing endurance.   PMHx: past medical history of Anemia, CVA (cerebral vascular accident) ([place]), Epilepsy ([place]), Fibroids, H/O bacterial infection, H/O mumps, Hyperlipidemia, Hypertension, and Seizures ([place]     PAIN:  Are you having pain? Yes: NPRS scale: current 8.5/10; worst 10/10; least 0/10 Pain location: LB and legs Pain description: ache, cramp Aggravating factors: walking, standing too long ~10; walking Relieving factors: lying perfectly still  PRECAUTIONS: None  WEIGHT BEARING RESTRICTIONS: No  FALLS:  Has patient fallen in last 6 months? Yes. Number of falls 2 tripped off a step, fell off bed  LIVING ENVIRONMENT: Lives with: lives with their family Lives in: House/apartment Stairs:  Yes: Internal: 1-2 steps; none Has following equipment at home: None  OCCUPATION: disability  PLOF: Independent  PATIENT GOALS: decrease pain, get my body back  NEXT MD VISIT: unknown  OBJECTIVE:   DIAGNOSTIC FINDINGS:  IMPRESSION: 1. New 3 mm anterolisthesis of L4 on L5, likely facet mediated. Mild L4-L5 degenerative disc disease. 2. Mild L4-L5 and L5-S1 facet hypertrophy. 3. Please note there are 6 non-rib-bearing lumbar vertebra (variant anatomy). For numbering purposes the lower most lumbar vertebra was labeled L5. Close attention to numbering is recommended if intervention is planned.   PATIENT SURVEYS:  ODI: to be completed next session  SCREENING FOR RED FLAGS: neg  COGNITION: Overall cognitive status: Within functional limits for tasks assessed     SENSATION: WFL  MUSCLE LENGTH:    POSTURE: decreased lumbar lordosis and flexed trunk   PALPATION: Slight TTP along  LUMBAR ROM:   AROM eval  Flexion Mid calf  Extension 25%  Right lateral flexion 25%   Left lateral flexion 25%  Right rotation   Left rotation    (Blank rows = not tested)  LOWER EXTREMITY ROM:     Wfl but limited due to pain at end ranges of hip flex   LOWER EXTREMITY MMT:     Pain limiting strength throughout  MMT Right eval Left eval  Hip flexion 3- 3-  Hip extension    Hip abduction 3+ 3+  Hip adduction 3+_ 3+  Hip internal rotation    Hip external rotation    Knee flexion 3 3  Knee extension 3 3  Ankle dorsiflexion 4 4  Ankle plantarflexion 4 4  Ankle inversion    Ankle eversion     (Blank rows = not tested)  LUMBAR SPECIAL TESTS:  Slump test: Positive  FUNCTIONAL TESTS:  Timed up and go (TUG): 22.97 30s STS 3 Berg: tba  GAIT: Distance walked: 459ft Assistive device utilized: None Level of assistance: Complete Independence Comments: antalgic  TODAY'S TREATMENT:  Evaluation Objective testing Pt edu   PATIENT EDUCATION:  Education details: Discussed eval findings, rehab rationale and POC and patient is in agreement Person educated: Patient Education method: Theatre stage manager Education comprehension: verbalized understanding  HOME EXERCISE PROGRAM: tba  ASSESSMENT:  CLINICAL IMPRESSION: Patient is a 52 y.o. f who was seen today for physical therapy evaluation and treatment for chronic pain syndrome and bilateral LBP with sciatica. She has a lengthy Pmhx as noted above. She initially reports she is afraid of water but after conversation she has decided she will trial sessions to reduce pain and improve function.  She may have transportation difficulties limiting progression towards goals. She presents with decreased strength, high pain sensitivity with radicular pattern through le's and fairly high fall risk. Of note according to MRI has an additional lumbar vertebra.  She will benefits from aquatic therapy intervention to improve all areas of deficit and decrease pain symptoms. She reports she has not tolerated land based interventions before. Plan next session: complete ODI  OBJECTIVE IMPAIRMENTS: decreased activity tolerance, decreased balance, decreased endurance, decreased knowledge of condition, decreased mobility, difficulty walking, decreased strength, impaired sensation, obesity, and pain.   ACTIVITY LIMITATIONS: carrying, lifting, bending, sitting, standing, squatting, sleeping, stairs, and transfers  PARTICIPATION LIMITATIONS: cleaning, driving, shopping, community activity, occupation, and yard work  PERSONAL FACTORS: Fitness, Past/current experiences, Time since onset of injury/illness/exacerbation, and 1-2 comorbidities: CVA, epilepsy  are also affecting patient's functional outcome.   REHAB POTENTIAL: Fair    CLINICAL DECISION MAKING: Unstable/unpredictable  EVALUATION COMPLEXITY:  Moderate   GOALS: Goals reviewed with patient? Yes  SHORT TERM GOALS: Target date: 09/22/22  Pt will tolerate full aquatic sessions consistently without increase in pain and with improving function to demonstrate good toleration and effectiveness of intervention.   Baseline: Goal status: INITIAL  2.  Pt will improve on Tug test to <or= 15s to demonstrate improvement in lower extremity function, mobility and decreased fall risk.  Baseline: 22.97 Goal status: INITIAL  3.  Pt wil tolerate walking to and from setting (500 ft each way) without increase in pain Baseline: pain Goal status: INITIAL    LONG TERM GOALS: Target date: 11/11/22  Pt will improve le strength by 1 grade or better to demonstrate improved overall physical function  Baseline:  Goal status: INITIAL  2.  Pt will be indep with final HEP's (land and aquatic as appropriate) for continued management of condition  Baseline:  Goal status: INITIAL  3.  Pt will improve ROM lumbar spine by 50% to improve function, decrease risk of injury and pain and improve quality of life Baseline: see chart Goal status: INITIAL  PLAN:  PT FREQUENCY: 1-2x/week  PT DURATION: 6 weeks  PLANNED INTERVENTIONS: Therapeutic exercises, Therapeutic activity, Neuromuscular re-education, Balance training, Gait training, Patient/Family education, Self Care, Joint mobilization, Joint manipulation, Stair training, Orthotic/Fit training, DME instructions, Aquatic Therapy, Dry Needling, Electrical stimulation, Cryotherapy, Moist heat, scar mobilization, Splintting, Taping, Ultrasound, Biofeedback, Ionotophoresis 4mg /ml Dexamethasone, Manual therapy, and Re-evaluation.  PLAN FOR NEXT SESSION: trial aquatic intervention as pt reports afraid of water. Progress towards strengthening and gentle ROM lb and LE then balance as tolerated   Stanton Kidney [facility]as) Cayli Escajeda MPT 09/01/2022, 10:32 AM

## 2022-08-31 ENCOUNTER — Encounter (HOSPITAL_BASED_OUTPATIENT_CLINIC_OR_DEPARTMENT_OTHER): Payer: Self-pay | Admitting: Physical Therapy

## 2022-08-31 ENCOUNTER — Ambulatory Visit (HOSPITAL_BASED_OUTPATIENT_CLINIC_OR_DEPARTMENT_OTHER): Payer: Commercial Managed Care - HMO | Attending: Physical Medicine and Rehabilitation | Admitting: Physical Therapy

## 2022-08-31 ENCOUNTER — Other Ambulatory Visit: Payer: Self-pay

## 2022-08-31 DIAGNOSIS — R2681 Unsteadiness on feet: Secondary | ICD-10-CM | POA: Diagnosis not present

## 2022-08-31 DIAGNOSIS — M5441 Lumbago with sciatica, right side: Secondary | ICD-10-CM | POA: Diagnosis not present

## 2022-08-31 DIAGNOSIS — G894 Chronic pain syndrome: Secondary | ICD-10-CM | POA: Insufficient documentation

## 2022-08-31 DIAGNOSIS — M6281 Muscle weakness (generalized): Secondary | ICD-10-CM | POA: Diagnosis not present

## 2022-08-31 DIAGNOSIS — G8929 Other chronic pain: Secondary | ICD-10-CM

## 2022-08-31 DIAGNOSIS — M5442 Lumbago with sciatica, left side: Secondary | ICD-10-CM | POA: Diagnosis not present

## 2022-09-08 NOTE — Progress Notes (Deleted)
Subjective:    Patient ID: Tina Moss, female    DOB: 1971-01-22, 52 y.o.   MRN: [id number]  HPI   Pain Inventory Average Pain {NUMBERS; 0-10:5044} Pain Right Now {NUMBERS; 0-10:5044} My pain is {PAIN DESCRIPTION:21022940}  In the last 24 hours, has pain interfered with the following? General activity {NUMBERS; 0-10:5044} Relation with others {NUMBERS; 0-10:5044} Enjoyment of life {NUMBERS; 0-10:5044} What TIME of day is your pain at its worst? {time of day:24191} Sleep (in general) {BHH GOOD/FAIR/POOR:22877}  Pain is worse with: {ACTIVITIES:21022942} Pain improves with: {PAIN IMPROVES BW:[id number] Relief from Meds: {NUMBERS; 0-10:5044}  Family History  Problem Relation Age of Onset   Hypertension Mother    Hypertension Sister    Arthritis Sister        knees   Mental illness Daughter        ? bipolar   Hypertension Brother    Deafness Brother    Speech disorder Brother        mute   Mental illness Brother        "he can just fly off"   Scoliosis Son    Breast cancer Neg Hx    Social History   Socioeconomic History   Marital status: Divorced    Spouse name: n/a   Number of children: 2   Years of education: 11th grade   Highest education level: 11th grade  Occupational History   Occupation: Housekeeper    Comment: [employer]  Tobacco Use   Smoking status: Some Days    Types: Cigars   Smokeless tobacco: Never   Tobacco comments:    "I think I just keep so much on my mind."  Vaping Use   Vaping Use: Never used  Substance and Sexual Activity   Alcohol use: Yes    Alcohol/week: 0.0 - 3.0 standard drinks of alcohol    Comment: social   Drug use: Not Currently    Types: Marijuana    Comment: occasionally   Sexual activity: Yes    Partners: Male    Birth control/protection: None  Other Topics Concern   Not on file  Social History Narrative   Lives with her daughter.   Son is currently incarcerated in [place].   She completed 11th grade, but was kicked  out and never when back, as she had become pregnant and was raising her daughter.   Divorced x 2.   Right handed   Drinks caffeine   One story home   Social Determinants of Health   Financial Resource Strain: Not on file  Food Insecurity: Not on file  Transportation Needs: Unmet Transportation Needs (08/28/2019)   PRAPARE - Hydrologist (Medical): Yes    Lack of Transportation (Non-Medical): Yes  Physical Activity: Not on file  Stress: Not on file  Social Connections: Not on file   Past Surgical History:  Procedure Laterality Date   CESAREAN SECTION     x2. at term   TEE WITHOUT CARDIOVERSION N/A 04/06/2018   Procedure: TRANSESOPHAGEAL ECHOCARDIOGRAM (TEE) BUBBLE STUDY;  Surgeon: Lelon Perla, MD;  Location: [facility] ENDOSCOPY;  Service: Cardiovascular;  Laterality: N/A;   TUBAL LIGATION     WISDOM TOOTH EXTRACTION     Past Surgical History:  Procedure Laterality Date   CESAREAN SECTION     x2. at term   TEE WITHOUT CARDIOVERSION N/A 04/06/2018   Procedure: TRANSESOPHAGEAL ECHOCARDIOGRAM (TEE) BUBBLE STUDY;  Surgeon: Lelon Perla, MD;  Location: [facility];  Service:  Cardiovascular;  Laterality: N/A;   TUBAL LIGATION     WISDOM TOOTH EXTRACTION     Past Medical History:  Diagnosis Date   Anemia    CVA (cerebral vascular accident) ([place])    2020   Epilepsy ([place])    Fibroids    H/O bacterial infection    H/O mumps    Hyperlipidemia    Hypertension    Seizures ([place])    LMP  (LMP Unknown) Comment: more than a year since last menstrual  Opioid Risk Score:   Fall Risk Score:  `1  Depression screen [facility] 2/9     08/16/2022   10:13 AM 07/12/2022    9:38 AM 05/17/2022    3:00 PM 03/16/2022    1:55 PM 12/28/2021    9:12 AM 10/15/2021   10:29 AM 10/06/2021    9:44 AM  Depression screen PHQ 2/9  Decreased Interest 3 2 3 3 3 3 3   Down, Depressed, Hopeless 3 3 3 3 3 3 2   PHQ - 2 Score 6 5 6 6 6 6 5   Altered sleeping 3 3 3 2 3 2 3   Tired,  decreased energy 3 3 3 3 3 3 3   Change in appetite 3 2 3 3 3 2 2   Feeling bad or failure about yourself  3 3 3 3 3 3 2   Trouble concentrating 3 3 3 3 3 2 3   Moving slowly or fidgety/restless 3 1 3 3 3 2 2   Suicidal thoughts 0 0 0 0 0 0 0  PHQ-9 Score 24 20 24 23 24 20 20   Difficult doing work/chores  Very difficult         Review of Systems     Objective:   Physical Exam        Assessment & Plan:

## 2022-09-09 ENCOUNTER — Ambulatory Visit (HOSPITAL_BASED_OUTPATIENT_CLINIC_OR_DEPARTMENT_OTHER): Payer: Commercial Managed Care - HMO | Admitting: Physical Therapy

## 2022-09-13 ENCOUNTER — Encounter: Payer: Commercial Managed Care - HMO | Admitting: Physical Medicine and Rehabilitation

## 2022-09-16 ENCOUNTER — Telehealth: Payer: Self-pay | Admitting: Family Medicine

## 2022-09-16 ENCOUNTER — Ambulatory Visit: Payer: Self-pay

## 2022-09-16 NOTE — Telephone Encounter (Signed)
     Chief Complaint: Center, low back pain. Radiates down left leg. Pain 10/10. Gabapentin not helping. Asking for something for pain. Symptoms: Above Frequency: March Pertinent Negatives: Patient denies  Disposition: [] ED /[] Urgent Care (no appt availability in office) / [] Appointment(In office/virtual)/ []  [facility]/ [] Home Care/ [] Refused Recommended Disposition /[] [facility]/ [x]  Follow-up with PCP Additional Notes: Please advise pt.  Answer Assessment - Initial Assessment Questions 1. ONSET: "When did the pain begin?"      Last month 2. LOCATION: "Where does it hurt?" (upper, mid or lower back)     Low and center 3. SEVERITY: "How bad is the pain?"  (e.g., Scale 1-10; mild, moderate, or severe)   - MILD (1-3): Doesn't interfere with normal activities.    - MODERATE (4-7): Interferes with normal activities or awakens from sleep.    - SEVERE (8-10): Excruciating pain, unable to do any normal activities.      10 4. PATTERN: "Is the pain constant?" (e.g., yes, no; constant, intermittent)      Constant 5. RADIATION: "Does the pain shoot into your legs or somewhere else?"     Sometimes down left leg 6. CAUSE:  "What do you think is causing the back pain?"      Unsure 7. BACK OVERUSE:  "Any recent lifting of heavy objects, strenuous work or exercise?"     No 8. MEDICINES: "What have you taken so far for the pain?" (e.g., nothing, acetaminophen, NSAIDS)     Gabapentin 9. NEUROLOGIC SYMPTOMS: "Do you have any weakness, numbness, or problems with bowel/bladder control?"     No 10. OTHER SYMPTOMS: "Do you have any other symptoms?" (e.g., fever, abdomen pain, burning with urination, blood in urine)       No 11. PREGNANCY: "Is there any chance you are pregnant?" "When was your last menstrual period?"       No  Protocols used: Back Pain-A-AH

## 2022-09-16 NOTE — Telephone Encounter (Signed)
Patient returned call back from NT call to get earlier appt. Please call back

## 2022-09-16 NOTE — Telephone Encounter (Signed)
Call placed for follow-up and to offer earlier appointment. unable to reach message left on VM.

## 2022-09-17 NOTE — Telephone Encounter (Signed)
Pt has been sent a mychart message. 

## 2022-09-23 ENCOUNTER — Ambulatory Visit (HOSPITAL_BASED_OUTPATIENT_CLINIC_OR_DEPARTMENT_OTHER): Payer: Commercial Managed Care - HMO | Admitting: Physical Therapy

## 2022-09-28 ENCOUNTER — Ambulatory Visit (HOSPITAL_BASED_OUTPATIENT_CLINIC_OR_DEPARTMENT_OTHER): Payer: Commercial Managed Care - HMO | Attending: Physical Medicine and Rehabilitation | Admitting: Physical Therapy

## 2022-09-28 DIAGNOSIS — G894 Chronic pain syndrome: Secondary | ICD-10-CM | POA: Insufficient documentation

## 2022-09-28 DIAGNOSIS — M6281 Muscle weakness (generalized): Secondary | ICD-10-CM | POA: Insufficient documentation

## 2022-09-28 DIAGNOSIS — M5442 Lumbago with sciatica, left side: Secondary | ICD-10-CM | POA: Insufficient documentation

## 2022-09-28 DIAGNOSIS — M5441 Lumbago with sciatica, right side: Secondary | ICD-10-CM | POA: Insufficient documentation

## 2022-09-28 DIAGNOSIS — R2681 Unsteadiness on feet: Secondary | ICD-10-CM | POA: Insufficient documentation

## 2022-10-04 ENCOUNTER — Encounter
Payer: Commercial Managed Care - HMO | Attending: Physical Medicine and Rehabilitation | Admitting: Physical Medicine and Rehabilitation

## 2022-10-04 DIAGNOSIS — G8929 Other chronic pain: Secondary | ICD-10-CM | POA: Insufficient documentation

## 2022-10-04 DIAGNOSIS — F3289 Other specified depressive episodes: Secondary | ICD-10-CM | POA: Insufficient documentation

## 2022-10-04 DIAGNOSIS — M5440 Lumbago with sciatica, unspecified side: Secondary | ICD-10-CM | POA: Insufficient documentation

## 2022-10-04 DIAGNOSIS — Z8673 Personal history of transient ischemic attack (TIA), and cerebral infarction without residual deficits: Secondary | ICD-10-CM | POA: Insufficient documentation

## 2022-10-04 DIAGNOSIS — G894 Chronic pain syndrome: Secondary | ICD-10-CM | POA: Insufficient documentation

## 2022-10-07 ENCOUNTER — Ambulatory Visit (HOSPITAL_BASED_OUTPATIENT_CLINIC_OR_DEPARTMENT_OTHER): Payer: Commercial Managed Care - HMO | Admitting: Physical Therapy

## 2022-10-08 ENCOUNTER — Other Ambulatory Visit: Payer: Self-pay

## 2022-10-28 ENCOUNTER — Other Ambulatory Visit: Payer: Self-pay

## 2022-10-28 ENCOUNTER — Other Ambulatory Visit: Payer: Self-pay | Admitting: Family Medicine

## 2022-10-28 DIAGNOSIS — I10 Essential (primary) hypertension: Secondary | ICD-10-CM

## 2022-10-28 MED ORDER — CHLORTHALIDONE 25 MG PO TABS
25.0000 mg | ORAL_TABLET | Freq: Every day | ORAL | 0 refills | Status: DC
  Filled 2022-10-28: qty 90, 90d supply, fill #0

## 2022-11-01 ENCOUNTER — Other Ambulatory Visit: Payer: Self-pay

## 2022-11-02 ENCOUNTER — Other Ambulatory Visit: Payer: Self-pay

## 2022-11-12 ENCOUNTER — Telehealth: Payer: Self-pay

## 2022-11-12 ENCOUNTER — Other Ambulatory Visit (HOSPITAL_COMMUNITY): Payer: Self-pay

## 2022-11-12 ENCOUNTER — Other Ambulatory Visit: Payer: Self-pay

## 2022-11-12 ENCOUNTER — Other Ambulatory Visit: Payer: Self-pay | Admitting: Pharmacist

## 2022-11-12 DIAGNOSIS — N951 Menopausal and female climacteric states: Secondary | ICD-10-CM

## 2022-11-12 MED ORDER — VEOZAH 45 MG PO TABS: 45.0000 mg | ORAL_TABLET | Freq: Every day | ORAL | 1 refills | Status: DC

## 2022-11-12 NOTE — Telephone Encounter (Signed)
Created in error

## 2022-11-15 ENCOUNTER — Telehealth: Payer: Self-pay

## 2022-11-15 NOTE — Telephone Encounter (Signed)
Patient attempted to be outreached by Obadiah Dennard on 11/15/22 to discuss hypertension. Left voicemail for patient to return our call at their convenience at [phone].  Mikea Quadros, Student-PharmD 

## 2022-11-16 ENCOUNTER — Encounter: Payer: Self-pay | Admitting: Family Medicine

## 2022-11-16 ENCOUNTER — Other Ambulatory Visit: Payer: Self-pay

## 2022-11-16 ENCOUNTER — Ambulatory Visit: Payer: Commercial Managed Care - HMO | Attending: Family Medicine | Admitting: Family Medicine

## 2022-11-16 ENCOUNTER — Telehealth: Payer: Self-pay

## 2022-11-16 ENCOUNTER — Telehealth: Payer: Self-pay | Admitting: Family Medicine

## 2022-11-16 VITALS — BP 116/78 | HR 88 | Ht 67.0 in | Wt 199.8 lb

## 2022-11-16 DIAGNOSIS — M5441 Lumbago with sciatica, right side: Secondary | ICD-10-CM

## 2022-11-16 DIAGNOSIS — M5442 Lumbago with sciatica, left side: Secondary | ICD-10-CM

## 2022-11-16 DIAGNOSIS — F3289 Other specified depressive episodes: Secondary | ICD-10-CM | POA: Diagnosis not present

## 2022-11-16 DIAGNOSIS — Z8673 Personal history of transient ischemic attack (TIA), and cerebral infarction without residual deficits: Secondary | ICD-10-CM | POA: Diagnosis not present

## 2022-11-16 DIAGNOSIS — A539 Syphilis, unspecified: Secondary | ICD-10-CM

## 2022-11-16 DIAGNOSIS — K5909 Other constipation: Secondary | ICD-10-CM | POA: Diagnosis not present

## 2022-11-16 DIAGNOSIS — I10 Essential (primary) hypertension: Secondary | ICD-10-CM | POA: Diagnosis not present

## 2022-11-16 DIAGNOSIS — G8929 Other chronic pain: Secondary | ICD-10-CM | POA: Diagnosis not present

## 2022-11-16 DIAGNOSIS — Z91141 Patient's other noncompliance with medication regimen due to financial hardship: Secondary | ICD-10-CM | POA: Diagnosis not present

## 2022-11-16 DIAGNOSIS — Z1211 Encounter for screening for malignant neoplasm of colon: Secondary | ICD-10-CM

## 2022-11-16 DIAGNOSIS — K219 Gastro-esophageal reflux disease without esophagitis: Secondary | ICD-10-CM

## 2022-11-16 MED ORDER — LOSARTAN POTASSIUM 25 MG PO TABS
25.0000 mg | ORAL_TABLET | Freq: Every day | ORAL | 1 refills | Status: DC
Start: 2022-11-16 — End: 2023-02-01
  Filled 2022-11-16: qty 90, 90d supply, fill #0

## 2022-11-16 MED ORDER — CHLORTHALIDONE 25 MG PO TABS
25.0000 mg | ORAL_TABLET | Freq: Every day | ORAL | 1 refills | Status: DC
Start: 2022-11-16 — End: 2023-02-01
  Filled 2022-11-16: qty 90, 90d supply, fill #0

## 2022-11-16 MED ORDER — CLOPIDOGREL BISULFATE 75 MG PO TABS
75.0000 mg | ORAL_TABLET | Freq: Every day | ORAL | 1 refills | Status: DC
Start: 2022-11-16 — End: 2023-02-01

## 2022-11-16 MED ORDER — FAMOTIDINE 20 MG PO TABS
40.0000 mg | ORAL_TABLET | Freq: Every day | ORAL | 0 refills | Status: DC
Start: 2022-11-16 — End: 2022-12-17

## 2022-11-16 MED ORDER — FLUOXETINE HCL 20 MG PO CAPS
20.0000 mg | ORAL_CAPSULE | Freq: Every day | ORAL | 1 refills | Status: DC
Start: 2022-11-16 — End: 2023-02-01

## 2022-11-16 MED ORDER — KETOROLAC TROMETHAMINE 60 MG/2ML IM SOLN
60.0000 mg | Freq: Once | INTRAMUSCULAR | Status: AC
Start: 2022-11-16 — End: 2022-11-16
  Administered 2022-11-16: 60 mg via INTRAMUSCULAR

## 2022-11-16 MED ORDER — METOPROLOL TARTRATE 50 MG PO TABS
50.0000 mg | ORAL_TABLET | Freq: Two times a day (BID) | ORAL | 1 refills | Status: DC
Start: 2022-11-16 — End: 2023-09-27
  Filled 2022-11-16 – 2023-04-18 (×2): qty 180, 90d supply, fill #0
  Filled 2023-07-21: qty 180, 90d supply, fill #1

## 2022-11-16 NOTE — Progress Notes (Signed)
Subjective:  Patient ID: Tina Moss, female    DOB: [birth year]  Age: 52 y.o. MRN: [id number]  CC: Hypertension   HPI Tina Moss is a 52 y.o. year old female with a history of seizures (seizure free for the last 5 years and is not currently on antiseizure medication), multiple sclerosis, L MCA CVA in 03/2017, history of syphilis (diagnosed in 11/2021, status posttreatment with Penicillin G)  Interval History:  She has not had the finances to obtain her medications.  She Complains of constipation and has to strain to move her bowels. She is having to use pads due to urge incontinence and this is associated with pain.  Her med list reveals she is on oxybutynin which did help while she was taking it. Her back continues to hurt and radiates down both legs on some occasion and down her right leg on most occasions.  I had referred her for Physical Therapy which she had to discontinue due to the fact that she lost her insurance. She has to rock back and forth for relief.  Also unable to obtain her pain medications.  She saw the LCSW but states 'she is not for me. I prefer the former one. You know when someone listens and when someone is just getting paid.' Past Medical History:  Diagnosis Date   Anemia    CVA (cerebral vascular accident) ([facility])    2020   Epilepsy ([facility])    Fibroids    H/O bacterial infection    H/O mumps    Hyperlipidemia    Hypertension    Seizures ([facility])     Past Surgical History:  Procedure Laterality Date   CESAREAN SECTION     x2. at term   TEE WITHOUT CARDIOVERSION N/A 04/06/2018   Procedure: TRANSESOPHAGEAL ECHOCARDIOGRAM (TEE) BUBBLE STUDY;  Surgeon: Lewayne Bunting, MD;  Location: [facility] ENDOSCOPY;  Service: Cardiovascular;  Laterality: N/A;   TUBAL LIGATION     WISDOM TOOTH EXTRACTION      Family History  Problem Relation Age of Onset   Hypertension Mother    Hypertension Sister    Arthritis Sister        knees   Mental illness Daughter         ? bipolar   Hypertension Brother    Deafness Brother    Speech disorder Brother        mute   Mental illness Brother        "he can just fly off"   Scoliosis Son    Breast cancer Neg Hx     Social History   Socioeconomic History   Marital status: Divorced    Spouse name: n/a   Number of children: 2   Years of education: 11th grade   Highest education level: 11th grade  Occupational History   Occupation: Housekeeper    Comment: [employer]  Tobacco Use   Smoking status: Some Days    Types: Cigars   Smokeless tobacco: Never   Tobacco comments:    "I think I just keep so much on my mind."  Vaping Use   Vaping Use: Never used  Substance and Sexual Activity   Alcohol use: Yes    Alcohol/week: 0.0 - 3.0 standard drinks of alcohol    Comment: social   Drug use: Not Currently    Types: Marijuana    Comment: occasionally   Sexual activity: Yes    Partners: Male    Birth control/protection: None  Other Topics Concern  Not on file  Social History Narrative   Lives with her daughter.   Son is currently incarcerated in [place].   She completed 11th grade, but was kicked out and never when back, as she had become pregnant and was raising her daughter.   Divorced x 2.   Right handed   Drinks caffeine   One story home   Social Determinants of Health   Financial Resource Strain: Not on file  Food Insecurity: Not on file  Transportation Needs: Unmet Transportation Needs (08/28/2019)   PRAPARE - Administrator, Civil Service (Medical): Yes    Lack of Transportation (Non-Medical): Yes  Physical Activity: Not on file  Stress: Not on file  Social Connections: Not on file    No Known Allergies  Outpatient Medications Prior to Visit  Medication Sig Dispense Refill   atorvastatin (LIPITOR) 80 MG tablet Take 1 tablet (80 mg total) by mouth daily. 30 tablet 6   cetirizine (ZYRTEC) 10 MG tablet Take 1 tablet (10 mg total) by mouth daily. 30 tablet 1   donepezil (ARICEPT) 10  MG tablet take 1 tablet by mouth daily 30 tablet 11   Fezolinetant (VEOZAH) 45 MG TABS Take 1 tablet (45 mg total) by mouth daily. 90 tablet 1   fluticasone (FLONASE) 50 MCG/ACT nasal spray Place 2 sprays into both nostrils daily. 16 g 6   gabapentin (NEURONTIN) 300 MG capsule Take 2 capsules (600 mg total) by mouth 2 (two) times daily. 60 capsule 5   hydrOXYzine (ATARAX) 10 MG tablet Take 1 tablet (10 mg total) by mouth 3 (three) times daily as needed. 60 tablet 1   oxybutynin (DITROPAN XL) 5 MG 24 hr tablet Take 1 tablet (5 mg total) by mouth at bedtime. For overactive bladder 30 tablet 1   chlorthalidone (HYGROTON) 25 MG tablet Take 1 tablet (25 mg total) by mouth daily. 90 tablet 0   clopidogrel (PLAVIX) 75 MG tablet Take 1 tablet (75 mg total) by mouth daily. 90 tablet 1   famotidine (PEPCID) 20 MG tablet Take 2 tablets (40 mg total) by mouth daily. 180 tablet 0   FLUoxetine (PROZAC) 20 MG capsule Take 1 capsule (20 mg total) by mouth daily. 30 capsule 3   losartan (COZAAR) 25 MG tablet Take 1 tablet (25 mg total) by mouth daily 90 tablet 0   metoprolol tartrate (LOPRESSOR) 50 MG tablet Take 1 tablet (50 mg total) by mouth 2 (two) times daily. 180 tablet 1   No facility-administered medications prior to visit.     ROS Review of Systems  Constitutional:  Negative for activity change, appetite change and fatigue.  HENT:  Negative for congestion, sinus pressure and sore throat.   Eyes:  Negative for visual disturbance.  Respiratory:  Negative for cough, chest tightness, shortness of breath and wheezing.   Cardiovascular:  Negative for chest pain and palpitations.  Gastrointestinal:  Positive for constipation. Negative for abdominal distention and abdominal pain.  Endocrine: Negative for polydipsia.  Genitourinary:  Negative for dysuria and frequency.  Musculoskeletal:  Positive for back pain. Negative for arthralgias.  Skin:  Negative for rash.  Neurological:  Negative for tremors,  light-headedness and numbness.  Hematological:  Does not bruise/bleed easily.  Psychiatric/Behavioral:  Positive for dysphoric mood. Negative for agitation and behavioral problems.     Objective:  BP 116/78   Pulse 88   Ht 5\' 7"  (1.702 m)   Wt 199 lb 12.8 oz (90.6 kg)   LMP  (  LMP Unknown) Comment: more than a year since last menstrual  SpO2 96%   BMI 31.29 kg/m      11/16/2022   11:31 AM 08/16/2022   10:05 AM 07/12/2022    9:20 AM  BP/Weight  Systolic BP 116 142 158  Diastolic BP 78 96 96  Wt. (Lbs) 199.8 198 199.4  BMI 31.29 kg/m2 31.01 kg/m2 31.23 kg/m2      Physical Exam Constitutional:      Appearance: She is well-developed.  Cardiovascular:     Rate and Rhythm: Normal rate.     Heart sounds: Normal heart sounds. No murmur heard. Pulmonary:     Effort: Pulmonary effort is normal.     Breath sounds: Normal breath sounds. No wheezing or rales.  Chest:     Chest wall: No tenderness.  Abdominal:     General: Bowel sounds are normal. There is no distension.     Palpations: Abdomen is soft. There is no mass.     Tenderness: There is no abdominal tenderness.  Musculoskeletal:        General: Tenderness (of entire lumbar spine) present.     Right lower leg: No edema.     Left lower leg: No edema.     Comments: Positive straight leg raise bilaterally  Neurological:     Mental Status: She is alert and oriented to person, place, and time.  Psychiatric:        Mood and Affect: Mood normal.        Latest Ref Rng & Units 05/24/2022    2:32 PM 01/25/2022    4:49 PM 12/28/2021   11:23 AM  CMP  Glucose 70 - 99 mg/dL  85  96   BUN 6 - 24 mg/dL  13  12   Creatinine 8.29 - 1.00 mg/dL  5.62  1.30   Sodium 865 - 144 mmol/L  139  139   Potassium 3.5 - 5.2 mmol/L  3.9  3.8   Chloride 96 - 106 mmol/L  105  103   CO2 20 - 29 mmol/L  21  23   Calcium 8.7 - 10.2 mg/dL  9.4  9.5   Total Protein 6.0 - 8.5 g/dL 7.5       Lipid Panel     Component Value Date/Time   CHOL 209  (H) 05/24/2022 1432   TRIG 135 05/24/2022 1432   HDL 49 05/24/2022 1432   CHOLHDL 3.5 02/09/2021 1203   CHOLHDL 2.9 04/05/2018 0535   VLDL 13 04/05/2018 0535   LDLCALC 136 (H) 05/24/2022 1432    CBC    Component Value Date/Time   WBC 4.7 10/16/2021 0843   WBC 3.2 (L) 09/10/2021 0923   RBC 4.35 10/16/2021 0843   RBC 4.53 09/10/2021 0923   HGB 13.1 10/16/2021 0843   HCT 39.6 10/16/2021 0843   PLT 247 10/16/2021 0843   MCV 91 10/16/2021 0843   MCH 30.1 10/16/2021 0843   MCH 31.2 04/02/2021 2034   MCHC 33.1 10/16/2021 0843   MCHC 32.7 09/10/2021 0923   RDW 13.2 10/16/2021 0843   LYMPHSABS 1.4 10/16/2021 0843   MONOABS 0.4 04/02/2021 2034   EOSABS 0.0 10/16/2021 0843   BASOSABS 0.0 10/16/2021 0843    Lab Results  Component Value Date   HGBA1C 5.7 05/17/2022       11/16/2022   11:50 AM 08/16/2022   10:13 AM 07/12/2022    9:38 AM 05/17/2022    3:00 PM 03/16/2022    1:55  PM  Depression screen PHQ 2/9  Decreased Interest 3 3 2 3 3   Down, Depressed, Hopeless 3 3 3 3 3   PHQ - 2 Score 6 6 5 6 6   Altered sleeping 3 3 3 3 2   Tired, decreased energy 3 3 3 3 3   Change in appetite 3 3 2 3 3   Feeling bad or failure about yourself  3 3 3 3 3   Trouble concentrating 3 3 3 3 3   Moving slowly or fidgety/restless 3 3 1 3 3   Suicidal thoughts 0 0 0 0 0  PHQ-9 Score 24 24 20 24 23   Difficult doing work/chores   Very difficult         11/16/2022   11:50 AM 08/16/2022   10:13 AM 05/17/2022    3:00 PM 03/16/2022    1:55 PM  GAD 7 : Generalized Anxiety Score  Nervous, Anxious, on Edge 3 3 3 2   Control/stop worrying 3 3 3 3   Worry too much - different things 3 3 3 3   Trouble relaxing 3 3 3 3   Restless 3 3 3 2   Easily annoyed or irritable 3 3 3 3   Afraid - awful might happen 3 3 3 3   Total GAD 7 Score 21 21 21 19      Assessment & Plan:  1. Syphilis Diagnosed in 11/2021 Status posttreatment She is due for repeat monitoring with RPR titer - RPR w/reflex to TrepSure  2. Other  constipation Would like to place on MiraLAX however she states her sister has this at home and she will use this as well Counseled on increasing fiber intake, fruits and vegetable, limit intake of foods like cheese, white bread, white rice   3. Screening for colon cancer  - Fecal occult blood, imunochemical([facility])  4. Primary hypertension Controlled This is surprising given she has been without her medications We may need to consider discontinuing some medications down the road if she is hypotensive Counseled on blood pressure goal of less than 130/80, low-sodium, DASH diet, medication compliance, 150 minutes of moderate intensity exercise per week. Discussed medication compliance, adverse effects.  - Basic Metabolic Panel - chlorthalidone (HYGROTON) 25 MG tablet; Take 1 tablet (25 mg total) by mouth daily.  Dispense: 90 tablet; Refill: 1 - losartan (COZAAR) 25 MG tablet; Take 1 tablet (25 mg total) by mouth daily  Dispense: 90 tablet; Refill: 1 - metoprolol tartrate (LOPRESSOR) 50 MG tablet; Take 1 tablet (50 mg total) by mouth 2 (two) times daily.  Dispense: 180 tablet; Refill: 1  5. History of stroke She has been without Plavix We have discussed risk factor modification and secondary prevention unfortunately her financial constraints are playing a huge role in medication nonadherence Advised to speak with the pharmacy for available resources to assist with medication - clopidogrel (PLAVIX) 75 MG tablet; Take 1 tablet (75 mg total) by mouth daily.  Dispense: 90 tablet; Refill: 1  6. GERD without esophagitis Stable - famotidine (PEPCID) 20 MG tablet; Take 2 tablets (40 mg total) by mouth daily.  Dispense: 180 tablet; Refill: 0  7. Other depression Uncontrolled with PHQ-9 score of 24 and GAD-7 score of 21 Other chronic pain, financial situation contributing She has been unable to afford Prozac Seen by LCSW in the past because she did not feel session was beneficial -  FLUoxetine (PROZAC) 20 MG capsule; Take 1 capsule (20 mg total) by mouth daily.  Dispense: 90 capsule; Refill: 1  8. Chronic bilateral low back pain with bilateral  sciatica Uncontrolled She has been without pain medication due to cost Since she has been off her Plavix I will proceed with administration of Toradol .Advised to apply heat or ice whichever is tolerated to painful areas. Counseled on evidence of improvement in pain control with regards to yoga, water aerobics, massage, home physical therapy, exercise as tolerated. - ketorolac (TORADOL) injection 60 mg  Meds ordered this encounter  Medications   chlorthalidone (HYGROTON) 25 MG tablet    Sig: Take 1 tablet (25 mg total) by mouth daily.    Dispense:  90 tablet    Refill:  1   clopidogrel (PLAVIX) 75 MG tablet    Sig: Take 1 tablet (75 mg total) by mouth daily.    Dispense:  90 tablet    Refill:  1   FLUoxetine (PROZAC) 20 MG capsule    Sig: Take 1 capsule (20 mg total) by mouth daily.    Dispense:  90 capsule    Refill:  1   losartan (COZAAR) 25 MG tablet    Sig: Take 1 tablet (25 mg total) by mouth daily    Dispense:  90 tablet    Refill:  1   metoprolol tartrate (LOPRESSOR) 50 MG tablet    Sig: Take 1 tablet (50 mg total) by mouth 2 (two) times daily.    Dispense:  180 tablet    Refill:  1   famotidine (PEPCID) 20 MG tablet    Sig: Take 2 tablets (40 mg total) by mouth daily.    Dispense:  180 tablet    Refill:  0   ketorolac (TORADOL) injection 60 mg    Follow-up: Return in about 3 months (around 02/16/2023) for Chronic medical conditions.       Hoy Register, MD, FAAFP. [facility] [place], [place] [phone]   11/16/2022, 12:24 PM

## 2022-11-16 NOTE — Telephone Encounter (Signed)
No Answer/Busy - Called pt to discuss financial assistance options - VM not available

## 2022-11-16 NOTE — Telephone Encounter (Signed)
I spoke to the patient when she stopped in the clinic today.  She had a question about her medication and also noted that she is uninsured.  She had not applied for Medicaid.  I took her to meet with the [facility] DSS [facility] Eligibility caseworkers. She completed a Medicaid application and was approved.

## 2022-11-16 NOTE — Patient Instructions (Signed)

## 2022-11-16 NOTE — Progress Notes (Signed)
Pain in back and legs Dizziness in the morning Can not hold urine.

## 2022-11-17 LAB — BASIC METABOLIC PANEL
BUN/Creatinine Ratio: 16 (ref 9–23)
BUN: 14 mg/dL (ref 6–24)
CO2: 26 mmol/L (ref 20–29)
Calcium: 9.9 mg/dL (ref 8.7–10.2)
Chloride: 100 mmol/L (ref 96–106)
Creatinine, Ser: 0.85 mg/dL (ref 0.57–1.00)
Glucose: 102 mg/dL — ABNORMAL HIGH (ref 70–99)
Potassium: 3.8 mmol/L (ref 3.5–5.2)
Sodium: 139 mmol/L (ref 134–144)
eGFR: 82 mL/min/{1.73_m2} (ref >59)

## 2022-11-17 LAB — RPR, QUANT: RPR, Quant: 1:2 {titer} — ABNORMAL HIGH

## 2022-11-17 LAB — RPR W/REFLEX TO TREPSURE: RPR: REACTIVE — AB

## 2022-11-18 ENCOUNTER — Other Ambulatory Visit: Payer: Self-pay

## 2022-11-18 ENCOUNTER — Ambulatory Visit: Payer: Medicaid Other | Admitting: Physician Assistant

## 2022-11-18 ENCOUNTER — Encounter: Payer: Self-pay | Admitting: Physician Assistant

## 2022-11-18 VITALS — BP 110/78 | HR 74 | Resp 20 | Ht 67.0 in | Wt 200.0 lb

## 2022-11-18 DIAGNOSIS — G3184 Mild cognitive impairment, so stated: Secondary | ICD-10-CM

## 2022-11-18 DIAGNOSIS — R413 Other amnesia: Secondary | ICD-10-CM

## 2022-11-18 LAB — FECAL OCCULT BLOOD, IMMUNOCHEMICAL: Fecal Occult Bld: NEGATIVE

## 2022-11-18 MED ORDER — DONEPEZIL HCL 10 MG PO TABS
ORAL_TABLET | ORAL | 11 refills | Status: DC
  Filled 2022-11-18: qty 30, fill #0

## 2022-11-18 NOTE — Progress Notes (Signed)
Assessment/Plan:   Memory Impairment   Tina Moss is a very pleasant 52 y.o. RH female  with a history of hypertension, hyperlipidemia, LM cerebral artery stroke, anemia, Vit D deficiency,  tobacco use,  anxiety, depression, history of memory impairment of uncertain etiology, history syphilis in June 2023 followed by PCP,  presenting today in follow-up for evaluation of memory loss. Patient is on donepezil 10 mg daily.  MMSE today 28/30.  Overall, Tina Moss memory is stable if not improved.  She is trying to stay more active, and she is also targeting Tina Moss other medical problems which in turn have an effect on Tina Moss memory.  She is able to participate all Tina Moss ADLs at home, she continues to drive   Recommendations:   Follow up in 6 months. Continue donepezil 10 mg daily, side effects discussed Recommend good control of cardiovascular risk factors Patient has referral to psychiatry for major depression.  Recommend that she make an appointment  Monitor driving Continue to control mood as per PCP    Subjective:   This patient is here alone.  Previous records as well as any outside records available were reviewed prior to todays visit.   Patient was last seen on 05/19/22 with [facility] 17/30    Any changes in memory since last visit?  She has to write down Tina Moss test otherwise she will forget.  She continues having issues with comprehension although she reports that Tina Moss memory seems to be better.  She enjoys reading letters and cards. Repeats oneself?  Endorsed Disoriented when walking into a room?  Patient denies except occasionally not remembering what patient came to the room for   Leaving objects in unusual places?  "Cannot find glasses, papers and medicines, sometimes I find anywhere places Tina Moss or know why ".  Wandering behavior?   denies   Any personality changes since last visit?   denies   Any worsening depression?:  Endorsed, but she does not wish to elaborate.  Memory difficulties affect  Tina Moss mood.  She is yet to see psychiatry she reports that she has seen a counselor but she did not like Tina Moss relationship with the professional.  She is in the search for someone else. Hallucinations or paranoia?  denies   Seizures?   denies    Any sleep changes?  Denies  vivid dreams, REM behavior or sleepwalking   Sleep apnea?   denies   Any hygiene concerns?   denies   Independent of bathing and dressing?  Endorsed  Does the patient needs help with medications?  Patient is in charge, she forgets some doses. Who is in charge of the finances?  Tina Moss mother is in charge     Any changes in appetite?  She does not eat as much as before    Patient have trouble swallowing?  denies   Does the patient cook?  Yes any kitchen accidents such as leaving the stove on?  Sometimes she forgets and leaves the stove on.  Any headaches?    denies   Vision changes? denies Chronic back pain endorsed.  She has chronic right leg pain, followed by in is does physical therapy. Ambulates with difficulty?  She has chronic back pain, "my knees are really bad "  She does physical therapy at home.   Recent falls or head injuries?    denies     Unilateral weakness, numbness or tingling?   denies   Any tremors?  denies   Any anosmia?  denies   Any incontinence of urine?  Endorsed.  Wears a pad Any bowel dysfunction?  Chronic constipation     Patient lives with Tina Moss sister  Does the patient drive?  Endorsed, sometimes she does not remember where she is supposed to go    Initial visit 09/10/2021 the patient is seen in neurologic consultation at the request of Hoy Register, MD for the evaluation of memory.  The patient is here alone. This is a 52 y.o. year old RH  female who has no recalling when Tina Moss memory issues began, "I will send my by Dr. Here".  She reports that she is very distracted, forgets appointments or things to do, forgets the time and she is unable to comprehend well.  She states that when she was a child,  she was diagnosed with epilepsy, but she is not aware when she stopped taking medications for it.  When she was in grade school, she receives special education "I had a Teacher, English as a foreign language ".  Of note, during this visit, the patient does have difficulty comprehending, expressing as well as being incoherent at times.  She works at [employer], and has noted that lately she has been burning some of the biscuits.  When she drives, sometimes she does not know where she goes with the car and has to stop, and she reports that occasionally she has a "blank ".  She leaves objects "everywhere, the other day I left my keys in the drawer, and papers near the mattress ".  She ambulates without difficulty, although in the past she had right-sided weakness without residual due to the stroke.  Tina Moss mood is depressed, denies any intermittent irritability.  She sleeps fairly well, denies vivid dreams or sleepwalking.  She does have some hallucinations seeing "floating blocks, little colors dancing ".  Sometimes, she reports being paranoid.  She denies any hygiene concerns, she is independent of bathing and dressing.  The medications are in a pillbox, but she forgets sometimes to take them.  When asked about who is in charge of the finances, incoherent answer is reported, stating "there are calls that the insurance lady takes care of ".  She cannot give a straight answer regarding finances.  Appetite is good, denies trouble swallowing.  She denies any headaches, "sometimes I have double vision" denies any dizziness, focal numbness or tingling, or unilateral weakness.  She denies tremors or anosmia.  Denies urine incontinence, sometimes she has intermittent constipation and diarrhea.  Denies sleep apnea, or alcohol.  She smokes less than a pack a day "I used to smoke more ".    MRI brain 04/03/21 No acute intracranial abnormality. 2. Chronic white matter disease most compatible with advanced small vessel ischemia, with  expected evolution of the small left MCA territory infarct seen in 2019. 3. Mild right frontal paranasal sinus inflammation.   MRI of the brain March 2023 1. No acute intracranial pathology. No significant change since the study from 04/03/2021. 2. 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  Past Medical History:  Diagnosis Date   Anemia    CVA (cerebral vascular accident) (HCC)    2020   Epilepsy (HCC)    Fibroids    H/O bacterial infection    H/O mumps    Hyperlipidemia    Hypertension    Seizures (HCC)  Past Surgical History:  Procedure Laterality Date   CESAREAN SECTION     x2. at term   TEE WITHOUT CARDIOVERSION N/A 04/06/2018   Procedure: TRANSESOPHAGEAL ECHOCARDIOGRAM (TEE) BUBBLE STUDY;  Surgeon: Lewayne Bunting, MD;  Location: [facility] ENDOSCOPY;  Service: Cardiovascular;  Laterality: N/A;   TUBAL LIGATION     WISDOM TOOTH EXTRACTION       PREVIOUS MEDICATIONS:   CURRENT MEDICATIONS:  Outpatient Encounter Medications as of 11/18/2022  Medication Sig   atorvastatin (LIPITOR) 80 MG tablet Take 1 tablet (80 mg total) by mouth daily.   cetirizine (ZYRTEC) 10 MG tablet Take 1 tablet (10 mg total) by mouth daily.   chlorthalidone (HYGROTON) 25 MG tablet Take 1 tablet (25 mg total) by mouth daily.   clopidogrel (PLAVIX) 75 MG tablet Take 1 tablet (75 mg total) by mouth daily.   famotidine (PEPCID) 20 MG tablet Take 2 tablets (40 mg total) by mouth daily.   Fezolinetant (VEOZAH) 45 MG TABS Take 1 tablet (45 mg total) by mouth daily.   FLUoxetine (PROZAC) 20 MG capsule Take 1 capsule (20 mg total) by mouth daily.   fluticasone (FLONASE) 50 MCG/ACT nasal spray Place 2 sprays into both nostrils daily.   gabapentin (NEURONTIN) 300 MG capsule Take 2 capsules (600 mg total) by mouth 2 (two) times daily.   hydrOXYzine  (ATARAX) 10 MG tablet Take 1 tablet (10 mg total) by mouth 3 (three) times daily as needed.   losartan (COZAAR) 25 MG tablet Take 1 tablet (25 mg total) by mouth daily   metoprolol tartrate (LOPRESSOR) 50 MG tablet Take 1 tablet (50 mg total) by mouth 2 (two) times daily.   oxybutynin (DITROPAN XL) 5 MG 24 hr tablet Take 1 tablet (5 mg total) by mouth at bedtime. For overactive bladder   [DISCONTINUED] donepezil (ARICEPT) 10 MG tablet take 1 tablet by mouth daily   donepezil (ARICEPT) 10 MG tablet take 1 tablet by mouth daily   No facility-administered encounter medications on file as of 11/18/2022.     Objective:     PHYSICAL EXAMINATION:    VITALS:   Vitals:   11/18/22 1408  BP: 110/78  Pulse: 74  Resp: 20  SpO2: 98%  Weight: 200 lb (90.7 kg)  Height: 5\' 7"  (1.702 m)    GEN:  The patient appears stated age and is in NAD. HEENT:  Normocephalic, atraumatic.   Neurological examination:  General: NAD, well-groomed, appears stated age. Orientation: The patient is alert. Oriented to person, place and date Cranial nerves: There is good facial symmetry.The speech is fluent and clear. No aphasia or dysarthria. Fund of knowledge is appropriate. Recent memory impaired and remote memory is normal.  Attention and concentration are normal.  Able to name objects and repeat phrases.  Hearing is intact to conversational tone .   Delayed recall 1/3 Sensation: Sensation is intact to light touch throughout Motor: Strength is at least antigravity x4. DTR's 2/4 in UE/LE      05/25/2022   11:21 AM 05/19/2022    2:00 PM 09/10/2021    8:00 AM  Montreal Cognitive Assessment   Visuospatial/ Executive (0/5) 2 2 3   Naming (0/3) 1 1 2   Attention: Read list of digits (0/2) 2 2 2   Attention: Read list of letters (0/1) 1 1 1   Attention: Serial 7 subtraction starting at 100 (0/3) 0 0 0  Language: Repeat phrase (0/2) 1 1 0  Language : Fluency (0/1) 0 0 1  Abstraction (0/2) 0  0 0  Delayed Recall (0/5)  4 4 2   Orientation (0/6) 5 5 6   Total 16 16 17   Adjusted Score (based on education) 17 16 18        11/18/2022    3:00 PM 09/09/2021   10:56 AM 02/16/2017   12:00 PM  MMSE - Mini Mental State Exam  Not completed:   Unable to complete  Orientation to time 5 4   Orientation to Place 5 5   Registration 3 3   Attention/ Calculation 5 5   Recall 1 2   Language- name 2 objects 2 2   Language- repeat 1 1   Language- follow 3 step command 3 3   Language- read & follow direction 1 1   Write a sentence 1 1   Copy design 1 1   Total score 28 28        Movement examination: Tone: There is normal tone in the UE/LE Abnormal movements:  no tremor.  No myoclonus.  No asterixis.   Coordination:  There is no decremation with RAM's. Normal finger to nose  Gait and Station: The patient has no difficulty arising out of a deep-seated chair without the use of the hands. The patient's stride length is good.  Gait is cautious and narrow.   Thank you for allowing [place] the opportunity to participate in the care of this nice patient. Please do not hesitate to contact us for any questions or concerns.   Total time spent on today's visit was 28 minutes dedicated to this patient today, preparing to see patient, examining the patient, ordering tests and/or medications and counseling the patient, documenting clinical information in the EHR or other health record, independently interpreting results and communicating results to the patient/family, discussing treatment and goals, answering patient's questions and coordinating care.  Cc:  Hoy Register, MD  Marlowe Kays 11/18/2022 3:06 PM

## 2022-11-18 NOTE — Patient Instructions (Signed)
It was a pleasure to see you today at our office.   Recommendations:  Follow up in  6 months Increase Donepezil  10 mg 1 tablet  daily. Referral to psychiatry   Whom to call:  Memory  decline, memory medications: Call our office [phone]   For psychiatric meds, mood meds: Please have your primary care physician manage these medications.   Counseling regarding caregiver distress, including caregiver depression, anxiety and issues regarding community resources, adult day care programs, adult living facilities, or memory care questions:   Feel free to contact Misty Taylor Palladino, Social Worker at [phone]   For assessment of decision of mental capacity and competency:  Call Dr. Michelle Haber, geriatric psychiatrist at [phone]   If you have any severe symptoms of a stroke, or other severe issues such as confusion,severe chills or fever, etc call 911 or go to the ER as you may need to be evaluated further      RECOMMENDATIONS FOR ALL PATIENTS WITH MEMORY PROBLEMS: 1. Continue to exercise (Recommend 30 minutes of walking everyday, or 3 hours every week) 2. Increase social interactions - continue going to Church and enjoy social gatherings with friends and family 3. Eat healthy, avoid fried foods and eat more fruits and vegetables 4. Maintain adequate blood pressure, blood sugar, and blood cholesterol level. Reducing the risk of stroke and cardiovascular disease also helps promoting better memory. 5. Avoid stressful situations. Live a simple life and avoid aggravations. Organize your time and prepare for the next day in anticipation. 6. Sleep well, avoid any interruptions of sleep and avoid any distractions in the bedroom that may interfere with adequate sleep quality 7. Avoid sugar, avoid sweets as there is a strong link between excessive sugar intake, diabetes, and cognitive impairment We discussed the Mediterranean diet, which has been shown to help patients reduce the  risk of progressive memory disorders and reduces cardiovascular risk. This includes eating fish, eat fruits and green leafy vegetables, nuts like almonds and hazelnuts, walnuts, and also use olive oil. Avoid fast foods and fried foods as much as possible. Avoid sweets and sugar as sugar use has been linked to worsening of memory function.  There is always a concern of gradual progression of memory problems. If this is the case, then we may need to adjust level of care according to patient needs. Support, both to the patient and caregiver, should then be put into place.    FALL PRECAUTIONS: Be cautious when walking. Scan the area for obstacles that may increase the risk of trips and falls. When getting up in the mornings, sit up at the edge of the bed for a few minutes before getting out of bed. Consider elevating the bed at the head end to avoid drop of blood pressure when getting up. Walk always in a well-lit room (use night lights in the walls). Avoid area rugs or power cords from appliances in the middle of the walkways. Use a walker or a cane if necessary and consider physical therapy for balance exercise. Get your eyesight checked regularly.  FINANCIAL OVERSIGHT: Supervision, especially oversight when making financial decisions or transactions is also recommended.  HOME SAFETY: Consider the safety of the kitchen when operating appliances like stoves, microwave oven, and blender. Consider having supervision and share cooking responsibilities until no longer able to participate in those. Accidents with firearms and other hazards in the house should be identified and addressed as well.   ABILITY TO BE LEFT ALONE: If patient is   unable to contact 911 operator, consider using LifeLine, or when the need is there, arrange for someone to stay with patients. Smoking is a fire hazard, consider supervision or cessation. Risk of wandering should be assessed by caregiver and if detected at any point, supervision  and safe proof recommendations should be instituted.  MEDICATION SUPERVISION: Inability to self-administer medication needs to be constantly addressed. Implement a mechanism to ensure safe administration of the medications.   DRIVING: Regarding driving, in patients with progressive memory problems, driving will be impaired. We advise to have someone else do the driving if trouble finding directions or if minor accidents are reported. Independent driving assessment is available to determine safety of driving.   If you are interested in the driving assessment, you can contact the following:  [employer] in [place] [phone]  [facility] [phone]  [facility] [phone]  [facility] [phone] or [phone]   

## 2022-11-19 ENCOUNTER — Other Ambulatory Visit: Payer: Self-pay

## 2022-11-26 NOTE — Telephone Encounter (Signed)
Thanks, Erskine Squibb! I called Ms. Bossie about 2 weeks ago, but wasn't able to speak with her. Thanks for directing her to the right place to receive the helps she needs.

## 2022-12-17 ENCOUNTER — Ambulatory Visit (HOSPITAL_COMMUNITY)
Admission: EM | Admit: 2022-12-17 | Discharge: 2022-12-17 | Disposition: A | Discharge status: Home / Self Care | Payer: Medicaid Other | Attending: Emergency Medicine | Admitting: Emergency Medicine

## 2022-12-17 ENCOUNTER — Ambulatory Visit: Payer: Self-pay | Admitting: *Deleted

## 2022-12-17 ENCOUNTER — Encounter (HOSPITAL_COMMUNITY): Payer: Self-pay

## 2022-12-17 DIAGNOSIS — K029 Dental caries, unspecified: Secondary | ICD-10-CM | POA: Diagnosis not present

## 2022-12-17 MED ORDER — KETOROLAC TROMETHAMINE 30 MG/ML IJ SOLN
INTRAMUSCULAR | Status: AC
  Filled 2022-12-17: qty 1

## 2022-12-17 MED ORDER — ACETAMINOPHEN 500 MG PO TABS: 500.0000 mg | ORAL_TABLET | Freq: Four times a day (QID) | ORAL | 0 refills | Status: DC | PRN

## 2022-12-17 MED ORDER — AMOXICILLIN-POT CLAVULANATE 875-125 MG PO TABS: 1.0000 | ORAL_TABLET | Freq: Two times a day (BID) | ORAL | 0 refills | Status: DC

## 2022-12-17 MED ORDER — KETOROLAC TROMETHAMINE 30 MG/ML IJ SOLN
30.0000 mg | Freq: Once | INTRAMUSCULAR | Status: AC
  Administered 2022-12-17: 30 mg via INTRAMUSCULAR

## 2022-12-17 NOTE — Telephone Encounter (Signed)
Reason for Disposition  [1] SEVERE mouth pain (e.g., excruciating) AND [2] not improved after 2 hours of pain medicine  Answer Assessment - Initial Assessment Questions 1. ONSET: "When did the mouth start hurting?" (e.g., hours or days ago)      I'm having pain in right side of my mouth top and bottom with 2 teeth.   About 3 yrs ago I had teeth pulled.    They didn't get the bottom one out of my gum.   It's broke off in my gum.   The pain is very bad. I'm doing the [employer].    I need some kind of antibiotic.   My whole face on right side and throat is very painful.    I was working back then.    They don't take Medicaid so I need a referral to a dentist.    2. SEVERITY: "How bad is the pain?" (Scale 1-10; mild, moderate or severe)   - MILD (1-3):  doesn't interfere with eating or normal activities   - MODERATE (4-7): interferes with eating some solids and normal activities   - SEVERE (8-10):  excruciating pain, interferes with most normal activities   - SEVERE DYSPHAGIA: can't swallow liquids, drooling     Severe pain 3. SORES: "Are there any sores or ulcers in the mouth?" If Yes, ask: "What part of the mouth are the sores in?"     No sores 4. FEVER: "Do you have a fever?" If Yes, ask: "What is your temperature, how was it measured, and when did it start?"     No 5. CAUSE: "What do you think is causing the mouth pain?"     I have a broken tooth on bottom.   I think I have an abscess in my gum. 6. OTHER SYMPTOMS: "Do you have any other symptoms?" (e.g., difficulty breathing)     No  Protocols used: Mouth Pain-A-AH

## 2022-12-17 NOTE — Discharge Instructions (Addendum)
You appear to have a dental infection.  Please take all antibiotics as prescribed until finished, you can take them with food to prevent gastrointestinal upset.  For the rest of today you can take Tylenol 500 mg every 6 hours as needed for pain and inflammation.  Starting tomorrow you can alternate between Tylenol and ibuprofen every 4-6 hours.  It is important that you follow-up with a dentist so they can treat the root of the issue, if left untreated your infections will recur.  Below are some dental resources for you to contact.  Seek immediate care if you develop high fever, worsening of swelling despite being on antibiotics for 3 days, trouble swallowing, drooling, or any new concerning symptoms.  [facility] [place] Address: [address] [place], [place], [place] [address] Phone: [phone]  [facility] [phone] extension [phone] [address]  Dr. Lawrence Marseilles [phone] [address]  [place] [phone] [address] [facility] [place].  Rescue mission [phone] extension [phone], [place], [place], [address] First come first serve for the first 10 clients.  May do simple extractions only, no wisdom teeth or surgery.  You may try the second for Thursday of the month starting at 6:30 AM.  [facility] You may call the school to see if they are still helping to provide dental care for emergent cases.

## 2022-12-17 NOTE — Telephone Encounter (Signed)
Requesting antibiotic for dental pain.

## 2022-12-17 NOTE — Telephone Encounter (Signed)
  Chief Complaint: C/o dental pain, and pain on right side of face from a tooth that is broken off in her gum. Symptoms: She believes she has an abscess and is requesting an antibiotic and a referral to a dentist that takes Medicaid in [place]. Frequency: Last few days. Pertinent Negatives: Patient denies having a dentist now.    Disposition: [] ED /[] Urgent Care (no appt availability in office) / [] Appointment(In office/virtual)/ []  [place] [facility]/ [] Home Care/ [] Refused Recommended Disposition /[] [place] [facility]/ [x]  Follow-up with PCP Additional Notes: I have referred her to the urgent care since there are no appts with [facility] until the end of July.   They can get her started on an antibiotic.     I am submitting a request to Dr. Alvis Lemmings for a referral to a dentist that accepts Medicaid.   She wants them to be in [place].   Pt agreeable to these plans.

## 2022-12-17 NOTE — ED Provider Notes (Signed)
[facility]    CSN: [id number] Arrival date & time: 12/17/22  1755      History   Chief Complaint Chief Complaint  Patient presents with   Dental Pain    HPI Tina Moss is a 52 y.o. female.   Patient reports ongoing issues with her dentition.  She does not currently have a dentist.  She has been having intermittent dental pain for a long time, just started with right lower jaw pain and swelling for the past 2 days.  Her pain is severe, feels like the area is pulsing.  Denies any drainage from the area or fevers.  Has been unable to eat or drink due to the pain.  Has used Orajel, an analgesic liquid, a hot rag without much relief. Last took Aleeve yesterday.   The history is provided by the patient and medical records.  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   Vitamin D deficiency 07/17/2020   Anxiety state 07/17/2020   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/2020   Hyperlipidemia LDL goal <100 09/18/2019   Acute pain of right knee 09/17/2019   Hip pain, acute, right 09/17/2019   Abnormal uterine bleeding (AUB) 02/13/2019   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   Tobacco use 04/04/2018   Heart palpitations 12/14/2016   LVH (left ventricular hypertrophy) 12/14/2016   Depression 11/15/2016   Essential hypertension 11/15/2016   Fibroids 01/18/2012   Menorrhagia 01/18/2012   Seizure disorder (HCC) 01/18/2012   Anemia 01/18/2012    Past  Surgical History:  Procedure Laterality Date   CESAREAN SECTION     x2. at term   TEE WITHOUT CARDIOVERSION N/A 04/06/2018   Procedure: TRANSESOPHAGEAL ECHOCARDIOGRAM (TEE) BUBBLE STUDY;  Surgeon: Lewayne Bunting, MD;  Location: [facility] ENDOSCOPY;  Service: Cardiovascular;  Laterality: N/A;   TUBAL LIGATION     WISDOM TOOTH EXTRACTION      OB History     Gravida  3   Para  2   Term  2   Preterm      AB  1   Living  2      SAB  1   IAB      Ectopic      Multiple      Live Births  2        Obstetric Comments  Term c/s x 2.           Home Medications    Prior to Admission medications   Medication Sig Start Date End Date Taking? Authorizing Provider  acetaminophen (TYLENOL) 500 MG tablet Take 1 tablet (500 mg total) by mouth every 6 (six) hours as needed. 12/17/22  Yes Rinaldo Ratel, [place] N, FNP  amoxicillin-clavulanate (AUGMENTIN) 875-125 MG tablet Take 1 tablet by mouth every 12 (twelve) hours. 12/17/22  Yes Rinaldo Ratel, [place] N, FNP  atorvastatin (LIPITOR) 80 MG  tablet Take 1 tablet (80 mg total) by mouth daily. 05/27/22   Hoy Register, MD  cetirizine (ZYRTEC) 10 MG tablet Take 1 tablet (10 mg total) by mouth daily. 08/16/22   Hoy Register, MD  chlorthalidone (HYGROTON) 25 MG tablet Take 1 tablet (25 mg total) by mouth daily. 11/16/22   Hoy Register, MD  clopidogrel (PLAVIX) 75 MG tablet Take 1 tablet (75 mg total) by mouth daily. 11/16/22   Hoy Register, MD  donepezil (ARICEPT) 10 MG tablet take 1 tablet by mouth daily 11/18/22   Marcos Eke, PA-C  FLUoxetine (PROZAC) 20 MG capsule Take 1 capsule (20 mg total) by mouth daily. 11/16/22   Hoy Register, MD  gabapentin (NEURONTIN) 300 MG capsule Take 2 capsules (600 mg total) by mouth 2 (two) times daily. 07/12/22   Angelina Sheriff, DO  hydrOXYzine (ATARAX) 10 MG tablet Take 1 tablet (10 mg total) by mouth 3 (three) times daily as needed. 08/16/22   Hoy Register, MD  losartan (COZAAR) 25 MG tablet Take 1 tablet  (25 mg total) by mouth daily 11/16/22   Hoy Register, MD  metoprolol tartrate (LOPRESSOR) 50 MG tablet Take 1 tablet (50 mg total) by mouth 2 (two) times daily. 11/16/22   Hoy Register, MD  oxybutynin (DITROPAN XL) 5 MG 24 hr tablet Take 1 tablet (5 mg total) by mouth at bedtime. For overactive bladder 03/17/22   Claiborne Rigg, NP    Family History Family History  Problem Relation Age of Onset   Hypertension Mother    Hypertension Sister    Arthritis Sister        knees   Mental illness Daughter        ? bipolar   Hypertension Brother    Deafness Brother    Speech disorder Brother        mute   Mental illness Brother        "he can just fly off"   Scoliosis Son    Breast cancer Neg Hx     Social History Social History   Tobacco Use   Smoking status: Some Days    Types: Cigars   Smokeless tobacco: Never   Tobacco comments:    "I think I just keep so much on my mind."  Vaping Use   Vaping Use: Never used  Substance Use Topics   Alcohol use: Yes    Alcohol/week: 0.0 - 3.0 standard drinks of alcohol    Comment: social   Drug use: Yes    Types: Marijuana    Comment: occasionally     Allergies   Patient has no known allergies.   Review of Systems Review of Systems  Constitutional:  Negative for fever.  HENT:  Positive for dental problem.      Physical Exam Triage Vital Signs ED Triage Vitals  Enc Vitals Group     BP 12/17/22 1836 (!) 146/94     Pulse Rate 12/17/22 1836 (!) 101     Resp 12/17/22 1836 14     Temp 12/17/22 1836 98.6 F (37 C)     Temp Source 12/17/22 1836 Oral     SpO2 12/17/22 1836 93 %     Weight --      Height --      Head Circumference --      Peak Flow --      Pain Score 12/17/22 1838 6     Pain Loc --      Pain Edu? --  Excl. in GC? --    No data found.  Updated Vital Signs BP (!) 146/94 (BP Location: Right Arm)   Pulse (!) 101   Temp 98.6 F (37 C) (Oral)   Resp 14   LMP  (LMP Unknown) Comment: more than a year  since last menstrual  SpO2 93%   Visual Acuity Right Eye Distance:   Left Eye Distance:   Bilateral Distance:    Right Eye Near:   Left Eye Near:    Bilateral Near:     Physical Exam Vitals and nursing note reviewed.  Constitutional:      Appearance: Normal appearance.  HENT:     Head: Normocephalic and atraumatic.     Right Ear: External ear normal.     Left Ear: External ear normal.     Nose: Nose normal.     Mouth/Throat:     Lips: Pink.     Mouth: Mucous membranes are moist.     Dentition: Abnormal dentition. Dental tenderness, gingival swelling and dental caries present.     Pharynx: Oropharynx is clear. Uvula midline.  Eyes:     Conjunctiva/sclera: Conjunctivae normal.  Cardiovascular:     Rate and Rhythm: Normal rate.  Pulmonary:     Effort: Pulmonary effort is normal. No respiratory distress.  Lymphadenopathy:     Cervical: Cervical adenopathy present.  Skin:    General: Skin is warm and dry.  Neurological:     General: No focal deficit present.     Mental Status: She is alert and oriented to person, place, and time.  Psychiatric:        Mood and Affect: Mood normal.        Behavior: Behavior normal. Behavior is cooperative.      UC Treatments / Results  Labs (all labs ordered are listed, but only abnormal results are displayed) Labs Reviewed - No data to display  EKG   Radiology No results found.  Procedures Procedures (including critical care time)  Medications Ordered in UC Medications  ketorolac (TORADOL) 30 MG/ML injection 30 mg (30 mg Intramuscular Given 12/17/22 1903)    Initial Impression / Assessment and Plan / UC Course  I have reviewed the triage vital signs and the nursing notes.  Pertinent labs & imaging results that were available during my care of the patient were reviewed by me and considered in my medical decision making (see chart for details).  Vitals and triage reviewed, patient is hemodynamically stable.  Right lower jaw  pain and swelling for the past 2 days with known poor dentition.  No obvious oral abscess appreciated on exam.  Multiple decaying teeth with erosion and gingival swelling.  Will cover with Augmentin for dental infection and provided with dental resources.  Pain management discussed, IM Toradol in clinic.  Plan of care, follow-up care and return precautions given, no questions at this time.    Final Clinical Impressions(s) / UC Diagnoses   Final diagnoses:  Pain due to dental caries     Discharge Instructions      You appear to have a dental infection.  Please take all antibiotics as prescribed until finished, you can take them with food to prevent gastrointestinal upset.  For the rest of today you can take Tylenol 500 mg every 6 hours as needed for pain and inflammation.  Starting tomorrow you can alternate between Tylenol and ibuprofen every 4-6 hours.  It is important that you follow-up with a dentist so they can treat the  root of the issue, if left untreated your infections will recur.  Below are some dental resources for you to contact.  Seek immediate care if you develop high fever, worsening of swelling despite being on antibiotics for 3 days, trouble swallowing, drooling, or any new concerning symptoms.  [facility] [place] Address: [address] [place], [place], [place] [address] Phone: [phone]  [facility] [phone] extension [phone] [address]  Dr. Lawrence Marseilles [phone] [address]  [place] [phone] [address] [facility] [place].  Rescue mission [phone] extension [phone] [place], [place], [address] First come first serve for the first 10 clients.  May do simple extractions only, no wisdom teeth or surgery.  You may try the second for Thursday of the month starting at 6:30 AM.  [facility] You may call the school to see if they are still helping to provide dental care for  emergent cases.       ED Prescriptions     Medication Sig Dispense Auth. Provider   amoxicillin-clavulanate (AUGMENTIN) 875-125 MG tablet Take 1 tablet by mouth every 12 (twelve) hours. 14 tablet Rinaldo Ratel, [place] N, [place]   acetaminophen (TYLENOL) 500 MG tablet Take 1 tablet (500 mg total) by mouth every 6 (six) hours as needed. 30 tablet Abbegail Matuska, [place] N, [place]      PDMP not reviewed this encounter.   Manasvini Whatley, [place] N, [place] 12/17/22 [phone]

## 2022-12-17 NOTE — ED Triage Notes (Signed)
Patient c/o dental pain right upper and lower x 2 days.  Patient states she has had Orajel, analgesic liquid from the store and Aleve.

## 2022-12-20 NOTE — Telephone Encounter (Signed)
Seen at urgent care.

## 2022-12-30 NOTE — Telephone Encounter (Signed)
This encounter was created in error - please disregard.

## 2023-01-06 DIAGNOSIS — H5213 Myopia, bilateral: Secondary | ICD-10-CM | POA: Diagnosis not present

## 2023-01-06 DIAGNOSIS — H2513 Age-related nuclear cataract, bilateral: Secondary | ICD-10-CM | POA: Diagnosis not present

## 2023-01-13 ENCOUNTER — Ambulatory Visit: Payer: Self-pay | Admitting: *Deleted

## 2023-01-13 NOTE — Telephone Encounter (Signed)
Summary: med advice   Pt has been dealing w/ toothache over a month.  She was given amoxicillin and tylenol but that has not done any good.  Pt has appt 8/26 w/a denist, but she doesn't feel she can take this pain until then.  Pt would like something stronger. In addition, is it ok for her to take her regular meds w/ the tylenol and abx.  She said she is scared to take her regular meds and has been off them for 3 weeks.              Chief Complaint: toothache severe pain  Symptoms: toothache severe pain. Back top 2 teeth Frequency: 1  month  Pertinent Negatives: Patient denies fever, no facial swelling reported  Disposition: [] ED /[x] Urgent Care (no appt availability in office) / [] Appointment(In office/virtual)/ []  [facility]/ [] Home Care/ [] Refused Recommended Disposition /[] [facility]/ []  Follow-up with PCP Additional Notes:   Recommended UC due to severe pain . Last seen UC 12/17/22 and given antibiotics. No dentist appt until 02/07/23. Patient has not been taking medications x 3 weeks. Patient unable to check BP for NT now. Instructed patient to recheck BP . Instructed patient to go to UC  and should be taking current medications. Esp. Plavix, cozzar and lopressor. Please advise regarding regular medications.  Patient requesting a call back.        Reason for Disposition  [1] SEVERE pain (e.g., excruciating, unable to eat, unable to do any normal activities) AND [2] not improved 2 hours after pain medicine  Answer Assessment - Initial Assessment Questions 1. LOCATION: "Which tooth is hurting?"  (e.g., right-side/left-side, upper/lower, front/back)     Back top 2 teeth  2. ONSET: "When did the toothache start?"  (e.g., hours, days)      Has been on going for 1 month  3. SEVERITY: "How bad is the toothache?"  (Scale 1-10; mild, moderate or severe)   - MILD (1-3): doesn't interfere with chewing    - MODERATE (4-7): interferes with chewing, interferes with  normal activities, awakens from sleep     - SEVERE (8-10): unable to eat, unable to do any normal activities, excruciating pain        Severe pain  4. SWELLING: "Is there any visible swelling of your face?"     Na  5. OTHER SYMPTOMS: "Do you have any other symptoms?" (e.g., fever)     Severe pain, has stopped taking regular medications in fear of not compatible  6. PREGNANCY: "Is there any chance you are pregnant?" "When was your last menstrual period?"     na  Protocols used: [facility]

## 2023-01-14 MED ORDER — ACETAMINOPHEN-CODEINE 300-30 MG PO TABS: 1.0000 | ORAL_TABLET | Freq: Two times a day (BID) | ORAL | 0 refills | Status: DC | PRN

## 2023-01-14 NOTE — Telephone Encounter (Signed)
Yes she can take her regular medications along with the antibiotics I have sent in a Prescription for Tylenol #3 for pain for her.

## 2023-01-14 NOTE — Telephone Encounter (Signed)
Left vague voicemail identifying where RN was calling from and a prescription  was sent to pharmacy to pick up.  Also advised patient to retun call to office.   WIll advise patient of Dr. Baxter Flattery message upon return call    Yes she can take her regular medications along with the antibiotics I have sent in a Prescription for Tylenol #3 for pain for her.

## 2023-01-20 ENCOUNTER — Ambulatory Visit: Payer: Medicaid Other | Admitting: Student

## 2023-01-21 ENCOUNTER — Other Ambulatory Visit (HOSPITAL_BASED_OUTPATIENT_CLINIC_OR_DEPARTMENT_OTHER): Payer: Self-pay

## 2023-01-21 ENCOUNTER — Ambulatory Visit: Payer: Self-pay

## 2023-01-21 ENCOUNTER — Other Ambulatory Visit: Payer: Self-pay

## 2023-01-21 ENCOUNTER — Telehealth: Payer: Self-pay | Admitting: Pharmacist

## 2023-01-21 NOTE — Telephone Encounter (Signed)
Message from [place] P sent at 01/21/2023  2:23 PM EDT  Summary: medication request   Pt called asking about the medication that was supposed to be prescribed for her mouth on 8/2.  She called the pharmacy and thy told her they can see it but the provider needs to go in and say ok to prescribe.  That is [facility]  She would like the medication asap because her mouth is still really sore.  CB# [phone]        Called every pharmacy on pt's list and no one shows med was ever called in. Only med seen was for the Acetaminophen with codeine.. Called pt and LM on VM that will send note to provider to review.  Reason for Disposition  [1] Caller requesting NON-URGENT health information AND [2] PCP's office is the best resource  Answer Assessment - Initial Assessment Questions 1. REASON FOR CALL or QUESTION: "What is your reason for calling today?" or "How can I best help you?" or "What question do you have that I can help answer?"     Where is med called in 01/14/23  Protocols used: Information Only Call - No Triage-A-AH

## 2023-01-21 NOTE — Telephone Encounter (Signed)
Hey Dr. Alvis Lemmings,   The Tylenol #3 that was prescribed for her on 8/2 was ordered as a phone-in rxn. The pharmacy cannot fill. Are you able to resend as an e-script? I cannot fill d/t it being a controlled substance.

## 2023-01-24 ENCOUNTER — Other Ambulatory Visit: Payer: Self-pay

## 2023-01-24 MED ORDER — ACETAMINOPHEN-CODEINE 300-30 MG PO TABS
1.0000 | ORAL_TABLET | Freq: Two times a day (BID) | ORAL | 0 refills | Status: DC | PRN
  Filled 2023-01-24: qty 30, 15d supply, fill #0

## 2023-01-24 NOTE — Telephone Encounter (Signed)
Done

## 2023-01-24 NOTE — Addendum Note (Signed)
Addended by: Hoy Register on: 01/24/2023 01:36 PM   Modules accepted: Orders

## 2023-01-25 ENCOUNTER — Other Ambulatory Visit: Payer: Self-pay

## 2023-02-01 ENCOUNTER — Other Ambulatory Visit: Payer: Self-pay

## 2023-02-01 ENCOUNTER — Ambulatory Visit: Payer: Medicaid Other | Admitting: Student

## 2023-02-01 VITALS — BP 155/90 | HR 98 | Temp 97.8°F | Ht 67.0 in | Wt 202.7 lb

## 2023-02-01 DIAGNOSIS — I10 Essential (primary) hypertension: Secondary | ICD-10-CM

## 2023-02-01 DIAGNOSIS — Z720 Tobacco use: Secondary | ICD-10-CM

## 2023-02-01 DIAGNOSIS — R7303 Prediabetes: Secondary | ICD-10-CM

## 2023-02-01 DIAGNOSIS — F3289 Other specified depressive episodes: Secondary | ICD-10-CM

## 2023-02-01 DIAGNOSIS — I63512 Cerebral infarction due to unspecified occlusion or stenosis of left middle cerebral artery: Secondary | ICD-10-CM

## 2023-02-01 DIAGNOSIS — R413 Other amnesia: Secondary | ICD-10-CM | POA: Diagnosis not present

## 2023-02-01 DIAGNOSIS — F332 Major depressive disorder, recurrent severe without psychotic features: Secondary | ICD-10-CM

## 2023-02-01 DIAGNOSIS — Z8673 Personal history of transient ischemic attack (TIA), and cerebral infarction without residual deficits: Secondary | ICD-10-CM

## 2023-02-01 DIAGNOSIS — E785 Hyperlipidemia, unspecified: Secondary | ICD-10-CM

## 2023-02-01 DIAGNOSIS — F32A Depression, unspecified: Secondary | ICD-10-CM

## 2023-02-01 DIAGNOSIS — F419 Anxiety disorder, unspecified: Secondary | ICD-10-CM

## 2023-02-01 DIAGNOSIS — F17290 Nicotine dependence, other tobacco product, uncomplicated: Secondary | ICD-10-CM | POA: Diagnosis not present

## 2023-02-01 LAB — POCT GLYCOSYLATED HEMOGLOBIN (HGB A1C): Hemoglobin A1C: 5.8 % — AB (ref 4.0–5.6)

## 2023-02-01 LAB — GLUCOSE, CAPILLARY: Glucose-Capillary: 109 mg/dL — ABNORMAL HIGH (ref 70–99)

## 2023-02-01 MED ORDER — DONEPEZIL HCL 10 MG PO TABS
10.0000 mg | ORAL_TABLET | Freq: Every day | ORAL | 11 refills | Status: DC
Start: 2023-02-01 — End: 2023-09-27
  Filled 2023-02-01: qty 90, 90d supply, fill #0
  Filled 2023-05-09 – 2023-05-27 (×2): qty 90, 90d supply, fill #1
  Filled 2023-08-26: qty 90, 90d supply, fill #2
  Filled 2023-09-26: qty 90, 90d supply, fill #3

## 2023-02-01 MED ORDER — FLUOXETINE HCL 20 MG PO CAPS
20.0000 mg | ORAL_CAPSULE | Freq: Every day | ORAL | 1 refills | Status: DC
Start: 2023-02-01 — End: 2023-09-27
  Filled 2023-02-01: qty 90, 90d supply, fill #0
  Filled 2023-05-09 – 2023-05-27 (×2): qty 90, 90d supply, fill #1

## 2023-02-01 MED ORDER — LOSARTAN POTASSIUM 25 MG PO TABS
25.0000 mg | ORAL_TABLET | Freq: Every day | ORAL | 1 refills | Status: DC
Start: 2023-02-01 — End: 2023-03-30
  Filled 2023-02-01: qty 90, 90d supply, fill #0

## 2023-02-01 MED ORDER — CLOPIDOGREL BISULFATE 75 MG PO TABS
75.0000 mg | ORAL_TABLET | Freq: Every day | ORAL | 1 refills | Status: DC
Start: 2023-02-01 — End: 2023-09-27
  Filled 2023-02-01: qty 90, 90d supply, fill #0
  Filled 2023-05-09 – 2023-05-27 (×2): qty 90, 90d supply, fill #1

## 2023-02-01 MED ORDER — ATORVASTATIN CALCIUM 80 MG PO TABS
80.0000 mg | ORAL_TABLET | Freq: Every day | ORAL | 3 refills | Status: DC
Start: 2023-02-01 — End: 2023-07-21
  Filled 2023-02-01: qty 90, 90d supply, fill #0
  Filled 2023-05-09 – 2023-05-27 (×2): qty 30, 30d supply, fill #1

## 2023-02-01 MED ORDER — HYDROXYZINE HCL 10 MG PO TABS
10.0000 mg | ORAL_TABLET | Freq: Three times a day (TID) | ORAL | 1 refills | Status: DC | PRN
Start: 2023-02-01 — End: 2023-08-24
  Filled 2023-02-01: qty 90, 30d supply, fill #0

## 2023-02-01 MED ORDER — CHLORTHALIDONE 25 MG PO TABS
25.0000 mg | ORAL_TABLET | Freq: Every day | ORAL | 1 refills | Status: DC
Start: 2023-02-01 — End: 2023-09-09
  Filled 2023-02-01: qty 90, 90d supply, fill #0
  Filled 2023-05-09 – 2023-05-27 (×2): qty 90, 90d supply, fill #1

## 2023-02-01 NOTE — Assessment & Plan Note (Signed)
Patient currently smokes

## 2023-02-01 NOTE — Assessment & Plan Note (Signed)
A1c today is 5.8. (5.7 05/2022). Patient counseled on the importance of diet and exercise. Patient has started to go to the [facility] 3 days/week.  - Lifestyle modifications - Provide diabetes/nutrition information

## 2023-02-01 NOTE — Assessment & Plan Note (Signed)
BP today is and

## 2023-02-01 NOTE — Patient Instructions (Addendum)
Thank you for allowing me to be a part of your care team. Today we discussed a few things:  I sent in re-fills for your prescriptions. There are no changes other than we will STOP taking Metoprolol (Lopressor) for now. Please take your blood pressure at home twice per day and write the numbers down. Bring that blood pressure log at your 2 week visit. I sent a referral to psychiatry and our behavior health counselor. I will let you know the results of your blood work.   Please let us know if you have any questions.

## 2023-02-01 NOTE — Progress Notes (Unsigned)
CC: Establish Care  HPI:  Tina Moss is a 52 y.o. female living with a history stated below and presents today to establish care. Please see problem based assessment and plan for additional details.  Past Medical History:  Diagnosis Date   Anemia    CVA (cerebral vascular accident) (HCC)    2020   Epilepsy (HCC)    Fibroids    H/O bacterial infection    H/O mumps    Hyperlipidemia    Hypertension    Seizures (HCC)     Current Outpatient Medications on File Prior to Visit  Medication Sig Dispense Refill   acetaminophen-codeine (TYLENOL #3) 300-30 MG tablet Take 1 tablet by mouth 2 (two) times daily as needed for moderate pain. 30 tablet 0   cetirizine (ZYRTEC) 10 MG tablet Take 1 tablet (10 mg total) by mouth daily. 30 tablet 1   gabapentin (NEURONTIN) 300 MG capsule Take 2 capsules (600 mg total) by mouth 2 (two) times daily. 60 capsule 5   metoprolol tartrate (LOPRESSOR) 50 MG tablet Take 1 tablet (50 mg total) by mouth 2 (two) times daily. 180 tablet 1   oxybutynin (DITROPAN XL) 5 MG 24 hr tablet Take 1 tablet (5 mg total) by mouth at bedtime. For overactive bladder 30 tablet 1   No current facility-administered medications on file prior to visit.    Family History  Problem Relation Age of Onset   Hypertension Mother    Hypertension Sister    Arthritis Sister        knees   Mental illness Daughter        ? bipolar   Hypertension Brother    Deafness Brother    Speech disorder Brother        mute   Mental illness Brother        "he can just fly off"   Scoliosis Son    Breast cancer Neg Hx     Social History   Socioeconomic History   Marital status: Divorced    Spouse name: n/a   Number of children: 2   Years of education: 11th grade   Highest education level: 11th grade  Occupational History   Occupation: Housekeeper    Comment: [employer]  Tobacco Use   Smoking status: Some Days    Types: Cigars   Smokeless tobacco: Never   Tobacco comments:     "I think I just keep so much on my mind."  Vaping Use   Vaping status: Never Used  Substance and Sexual Activity   Alcohol use: Yes    Alcohol/week: 0.0 - 3.0 standard drinks of alcohol    Comment: social   Drug use: Yes    Types: Marijuana    Comment: occasionally   Sexual activity: Yes    Partners: Male    Birth control/protection: None  Other Topics Concern   Not on file  Social History Narrative   Lives with her daughter.   Son is currently incarcerated in [place].   She completed 11th grade, but was kicked out and never when back, as she had become pregnant and was raising her daughter.   Divorced x 2.   Right handed   Drinks caffeine   One story home   Social Determinants of Health   Financial Resource Strain: Not on file  Food Insecurity: Not on file  Transportation Needs: Unmet Transportation Needs (08/28/2019)   PRAPARE - Administrator, Civil Service (Medical): Yes  Lack of Transportation (Non-Medical): Yes  Physical Activity: Not on file  Stress: Not on file  Social Connections: Not on file  Intimate Partner Violence: Not on file    Review of Systems: ROS negative except for what is noted on the assessment and plan.  Vitals:   02/01/23 1417  BP: (!) 155/90  Pulse: 98  Temp: 97.8 F (36.6 C)  TempSrc: Oral  SpO2: 99%  Weight: 202 lb 11.2 oz (91.9 kg)  Height: 5\' 7"  (1.702 m)    Physical Exam: Constitutional: well-appearing, sitting in chair, in no acute distress Cardiovascular: regular rate and rhythm, no m/r/g Pulmonary/Chest: normal work of breathing on room air, lungs clear to auscultation bilaterally MSK: normal bulk and tone Neurological: alert & oriented x 3, no focal deficit Skin: warm and dry Psych: flat affect  Assessment & Plan:     Patient seen with Dr. Lafonda Mosses  Prediabetes A1c today is 5.8. (5.7 05/2022). Patient counseled on the importance of diet and exercise. Patient has started to go to the [facility] 3 days/week.  -  Lifestyle modifications - Provide diabetes/nutrition information  Essential hypertension BP today is 155/90. Patient is prescribed Chlorthalidone 25 mg/daily, Losartan 25 mg/daily, and Lopressor 50 mg BID. Patient has not been taking any medication in the last few weeks due to concern that medications were making tooth pain worse (needs a filling and has dentist appointment soon). Patient counseled on the importance of medication compliance. Due to limited affect on BP, holding Lopressor for now. Patient to return in 2 weeks for BP check and to bring in BP log -Re-filled Chlorthalidone and Losartan  Hyperlipidemia LDL goal <100 LDL 136 in 05/2022 with a secondary prevention goal of  < 70. Patient prescribed Lipitor 80 mg/daily but has not been taking (same reason as above). - Order lipid panel - Re-fill Lipitor  Memory impairment Patient endorses memory impairment over the last few years. Patient states "I just forget a lot of things, and when I'm taking, I sometimes forget what I said 15 minutes ago." No exacerbating/alleviating factors. Patient followed by neurology and has been taking Donepezil 10 mg/daily (but not for past few weeks). Patient last seen by neurology on 11/2022 with [facility] Cognitive Assessment score of 17 and MMSE of 28. In terms of potentially contributing factors, patient is prescribed Prozac, Gabapentin, and in the past, had taken oxybutynin (not taken in over 6 months). Patient unsure if symptoms have improved since she has not taken medications for the past few weeks. Patient also has a history of ischemic stroke and MDD. Suspect etiology is multifactorial. See plan for MDD.   Left middle cerebral artery stroke [facility]) Patient with ischemic stroke in 2019. Prescribed Plavix 75 mg/daily but has not been taking. - Re-filled Plavix  Major depressive disorder Patient endorsing depressed mood with decreased: sleep, energy, concentration, and appetite. Also endorses anhedonia.  Denies SI. Prescribed Prozac 20 mg/daily but does not feel this is helping enough. Given refractory nature of depression and memory impairment, referral sent for psychiatry. - Appreciate psychiatry recommendations - Re-filled Prozac - Referral to [facility], [facility] Internal Medicine, PGY-1 Phone: [phone] Date 02/02/2023 Time 8:37 AM

## 2023-02-02 NOTE — Progress Notes (Signed)
Internal Medicine Clinic Attending  I was physically present during the key portions of the resident provided service and participated in the medical decision making of patient's management care. I reviewed pertinent patient test results.  The assessment, diagnosis, and plan were formulated together and I agree with the documentation in the resident's note.  Mercie Eon, MD     I will add that patient requested a referral to Psychiatry today now that she has insurance coverage. Dr. Annie Paras and I agreed, so he has placed referral.

## 2023-02-02 NOTE — Assessment & Plan Note (Signed)
Patient with ischemic stroke in 2019. Prescribed Plavix 75 mg/daily but has not been taking. - Re-filled Plavix

## 2023-02-02 NOTE — Assessment & Plan Note (Signed)
LDL 136 in 05/2022 with a secondary prevention goal of  < 70. Patient prescribed Lipitor 80 mg/daily but has not been taking (same reason as above). - Order lipid panel - Re-fill Lipitor

## 2023-02-02 NOTE — Assessment & Plan Note (Addendum)
Patient endorsing depressed mood with decreased: sleep, energy, concentration, and appetite. Also endorses anhedonia. Denies SI. Prescribed Prozac 20 mg/daily but does not feel this is helping enough. Given refractory nature of depression and memory impairment, referral sent for psychiatry. - Appreciate psychiatry recommendations - Re-filled Prozac - Referral to [facility]

## 2023-02-02 NOTE — Assessment & Plan Note (Signed)
Patient endorses memory impairment over the last few years. Patient states "I just forget a lot of things, and when I'm taking, I sometimes forget what I said 15 minutes ago." No exacerbating/alleviating factors. Patient followed by neurology and has been taking Donepezil 10 mg/daily (but not for past few weeks). Patient last seen by neurology on 11/2022 with [facility] Cognitive Assessment score of 17 and MMSE of 28. In terms of potentially contributing factors, patient is prescribed Prozac, Gabapentin, and in the past, had taken oxybutynin (not taken in over 6 months). Patient unsure if symptoms have improved since she has not taken medications for the past few weeks. Patient also has a history of ischemic stroke and MDD. Suspect etiology is multifactorial. See plan for MDD.

## 2023-02-02 NOTE — Assessment & Plan Note (Signed)
>>  ASSESSMENT AND PLAN FOR LEFT MIDDLE CEREBRAL ARTERY STROKE (HCC) WRITTEN ON 02/02/2023  8:26 AM BY INGOLD, TYLER, DO  Patient with ischemic stroke in 2019. Prescribed Plavix 75 mg/daily but has not been taking. - Re-filled Plavix

## 2023-02-03 ENCOUNTER — Other Ambulatory Visit (HOSPITAL_COMMUNITY): Payer: Self-pay

## 2023-02-04 LAB — LIPID PANEL
Chol/HDL Ratio: 4.7 ratio — ABNORMAL HIGH (ref 0.0–4.4)
Cholesterol, Total: 206 mg/dL — ABNORMAL HIGH (ref 100–199)
HDL: 44 mg/dL (ref >39)
LDL Chol Calc (NIH): 137 mg/dL — ABNORMAL HIGH (ref 0–99)
Triglycerides: 140 mg/dL (ref 0–149)
VLDL Cholesterol Cal: 25 mg/dL (ref 5–40)

## 2023-02-08 ENCOUNTER — Other Ambulatory Visit: Payer: Self-pay

## 2023-02-10 ENCOUNTER — Other Ambulatory Visit: Payer: Self-pay

## 2023-02-15 ENCOUNTER — Other Ambulatory Visit: Payer: Self-pay

## 2023-02-15 MED ORDER — AZITHROMYCIN 250 MG PO TABS
ORAL_TABLET | ORAL | 0 refills | Status: AC
  Filled 2023-02-15: qty 6, 5d supply, fill #0

## 2023-02-23 ENCOUNTER — Ambulatory Visit: Payer: Medicaid Other

## 2023-02-23 NOTE — Progress Notes (Addendum)
     Tina Moss presented today for blood pressure check. Patient is prescribed blood pressure medications and I confirmed that patient did not take their blood pressure medication prior to today's appointment. Blood pressure was taken in the usual and appropriate manner using an automated BP cuff.     Vitals:   02/23/23 1051  BP: 122/86      Results of today's visit will be routed to Dr. Willa Rough for review and further management.

## 2023-03-02 DIAGNOSIS — H524 Presbyopia: Secondary | ICD-10-CM | POA: Diagnosis not present

## 2023-03-10 ENCOUNTER — Ambulatory Visit (INDEPENDENT_AMBULATORY_CARE_PROVIDER_SITE_OTHER): Payer: Medicaid Other | Admitting: Licensed Clinical Social Worker

## 2023-03-10 DIAGNOSIS — F3289 Other specified depressive episodes: Secondary | ICD-10-CM | POA: Diagnosis present
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
## 2023-03-25 ENCOUNTER — Other Ambulatory Visit: Payer: Self-pay

## 2023-03-30 ENCOUNTER — Other Ambulatory Visit (HOSPITAL_COMMUNITY)
Admission: RE | Admit: 2023-03-30 | Discharge: 2023-03-30 | Disposition: A | Discharge status: Home / Self Care | Payer: Medicaid Other | Source: Ambulatory Visit | Attending: Internal Medicine | Admitting: Internal Medicine

## 2023-03-30 ENCOUNTER — Other Ambulatory Visit: Payer: Self-pay

## 2023-03-30 ENCOUNTER — Ambulatory Visit: Payer: Medicaid Other | Admitting: Student

## 2023-03-30 VITALS — BP 173/117 | HR 85 | Wt 204.9 lb

## 2023-03-30 DIAGNOSIS — N950 Postmenopausal bleeding: Secondary | ICD-10-CM | POA: Diagnosis not present

## 2023-03-30 DIAGNOSIS — F419 Anxiety disorder, unspecified: Secondary | ICD-10-CM

## 2023-03-30 DIAGNOSIS — F32A Depression, unspecified: Secondary | ICD-10-CM | POA: Diagnosis not present

## 2023-03-30 DIAGNOSIS — M150 Primary generalized (osteo)arthritis: Secondary | ICD-10-CM | POA: Diagnosis not present

## 2023-03-30 DIAGNOSIS — I10 Essential (primary) hypertension: Secondary | ICD-10-CM | POA: Diagnosis not present

## 2023-03-30 DIAGNOSIS — E785 Hyperlipidemia, unspecified: Secondary | ICD-10-CM | POA: Diagnosis not present

## 2023-03-30 MED ORDER — LOSARTAN POTASSIUM 50 MG PO TABS
50.0000 mg | ORAL_TABLET | Freq: Every day | ORAL | 3 refills | Status: DC
Start: 2023-03-30 — End: 2023-09-27
  Filled 2023-03-30: qty 30, 30d supply, fill #0
  Filled 2023-05-09 – 2023-05-27 (×2): qty 30, 30d supply, fill #1
  Filled 2023-06-22 – 2023-07-21 (×2): qty 30, 30d supply, fill #2
  Filled 2023-08-26: qty 30, 30d supply, fill #3

## 2023-03-30 MED ORDER — DICLOFENAC SODIUM 1 % EX GEL
2.0000 g | Freq: Four times a day (QID) | CUTANEOUS | 2 refills | Status: AC
Start: 2023-03-30 — End: ?
  Filled 2023-03-30: qty 100, 13d supply, fill #0
  Filled 2023-04-04: qty 100, 14d supply, fill #0
  Filled 2023-04-05: qty 100, 13d supply, fill #0
  Filled 2023-05-30: qty 100, 30d supply, fill #0
  Filled 2023-05-30: qty 100, 12d supply, fill #0

## 2023-03-30 NOTE — Patient Instructions (Addendum)
Thank you, Ms.[name] for allowing [place] to provide your care today.  I have ordered the following tests for you:  Lab Orders         CBC no Diff         BMP8+Anion Gap         Lipid Profile        Referrals ordered today:   Referral Orders         Ambulatory referral to Obstetrics / Gynecology       I have ordered the following medication/changed the following medications:   Stop the following medications: Medications Discontinued During This Encounter  Medication Reason   losartan (COZAAR) 25 MG tablet      Start the following medications: Meds ordered this encounter  Medications   losartan (COZAAR) 50 MG tablet    Sig: Take 1 tablet (50 mg total) by mouth daily.    Dispense:  30 tablet    Refill:  3   diclofenac Sodium (VOLTAREN) 1 % GEL    Sig: Apply 2 g topically 4 (four) times daily.    Dispense:  100 g    Refill:  2       Follow up:  1 month  for Follow up on today's visit  Please remember: bring your medications and blood pressure log in.    We look forward to seeing you next time. Please call our clinic at [phone] if you have any questions or concerns. The best time to call is Monday-Friday from 9am-4pm, but there is someone available 24/7. If after hours or the weekend, call the main hospital number and ask for the Internal Medicine Resident On-Call. If you need medication refills, please notify your pharmacy one week in advance and they will send [place] a request.   Thank you for trusting me with your care. Wishing you the best!  Lovie Macadamia MD [facility] Internal Medicine Center

## 2023-03-30 NOTE — Assessment & Plan Note (Signed)
Patient endorses an episode of recent postmenopausal bleeding.  She had been followed in the past for dysmenorrhea, and underwent a workup with her OB/GYN for bleeding about 4 years ago.  Unfortunately she never followed up with them.  She states that she has had no bleeding since that time, but does endorse clear discharge.  Denies recent sexual activity.  Most recent Pap NILM, HPV negative.  Denies dysuria or urinary symptoms.  Denies any other episodes of bleeding.  She states she felt a gush of fluid, and had some pink discharge/bleeding when bending over about 4 days ago. Speculum exam today was relatively unremarkable.  There was some nonbloody vaginal discharge which was swabbed, will be sent for cytology.  Plan: Referral to reestablish, follow with her OB/GYN Vaginal swab Pelvic ultrasound including transvaginal. CBC

## 2023-03-30 NOTE — Assessment & Plan Note (Signed)
>>  ASSESSMENT AND PLAN FOR ANXIETY AND DEPRESSION WRITTEN ON 03/30/2023 11:37 AM BY Sheree Dieter, MD  Patient endorses ongoing anxiety and depression.  She states that she currently lives with her sister which is stressful for her, and a source of emotional pain.  She does follow with our social worker/therapist, which she finds very helpful.  She was referred to psychiatry at last visit and will establish with them next month.  She is on fluoxetine 20 mg currently.  She denies SI or thoughts of harming herself.  Patient is upset about her mood, asks what she can do for this.  We discussed that mood disorders can be difficult to treat especially if there is an underlying reason or stressor-such as in this patient who has a less than ideal living situation currently.  We discussed that diet and exercise, medication, therapy, and behavioral modifications are the mainstays of treatment for anxiety and depression.  Plan: Continue fluoxetine 20, consider up titration at next visit Patient to establish with psychiatry Continue visits with integrated behavioral health 4-week follow-up to discuss her mood and pain in more detail.

## 2023-03-30 NOTE — Assessment & Plan Note (Signed)
Patient states she is currently taking chlorthalidone and losartan for her blood pressure.  She states she took her medications this morning.  Hypertensive to as much is 173/117 today.  Given her history of stroke is can be important to bring in her blood pressure. Plan: Increase losartan to 50 mg daily BMP today Follow-up 1 month

## 2023-03-30 NOTE — Progress Notes (Signed)
Subjective:  CC: Abnormal uterine bleeding, joint pain, depression and anxiety  HPI:  [name] is a 52 y.o. person with a past medical history stated below and presents today for above chief complaint. Please see problem based assessment and plan for additional details.  Past Medical History:  Diagnosis Date   Anemia    CVA (cerebral vascular accident) (HCC)    2020   Epilepsy (HCC)    Fibroids    H/O bacterial infection    H/O mumps    Hyperlipidemia    Hypertension    Seizures (HCC)     Current Outpatient Medications on File Prior to Visit  Medication Sig Dispense Refill   acetaminophen-codeine (TYLENOL #3) 300-30 MG tablet Take 1 tablet by mouth 2 (two) times daily as needed for moderate pain. 30 tablet 0   atorvastatin (LIPITOR) 80 MG tablet Take 1 tablet (80 mg total) by mouth daily. 30 tablet 3   cetirizine (ZYRTEC) 10 MG tablet Take 1 tablet (10 mg total) by mouth daily. 30 tablet 1   chlorthalidone (HYGROTON) 25 MG tablet Take 1 tablet (25 mg total) by mouth daily. 90 tablet 1   clopidogrel (PLAVIX) 75 MG tablet Take 1 tablet (75 mg total) by mouth daily. 90 tablet 1   donepezil (ARICEPT) 10 MG tablet Take 1 tablet (10 mg total) by mouth daily. 30 tablet 11   FLUoxetine (PROZAC) 20 MG capsule Take 1 capsule (20 mg total) by mouth daily. 90 capsule 1   gabapentin (NEURONTIN) 300 MG capsule Take 2 capsules (600 mg total) by mouth 2 (two) times daily. 60 capsule 5   hydrOXYzine (ATARAX) 10 MG tablet Take 1 tablet (10 mg total) by mouth 3 (three) times daily as needed. 60 tablet 1   metoprolol tartrate (LOPRESSOR) 50 MG tablet Take 1 tablet (50 mg total) by mouth 2 (two) times daily. 180 tablet 1   oxybutynin (DITROPAN XL) 5 MG 24 hr tablet Take 1 tablet (5 mg total) by mouth at bedtime. For overactive bladder 30 tablet 1   No current facility-administered medications on file prior to visit.    Review of Systems: Please see assessment and plan for pertinent  positives and negatives.  Objective:   Vitals:   03/30/23 0946 03/30/23 1049  BP: (!) 156/112 (!) 173/117  Pulse: 80 85  SpO2: 100%   Weight: 204 lb 14.4 oz (92.9 kg)     Physical Exam: Constitutional: Uncomfortable-appearing, in no acute distress Cardiovascular: regular rate and rhythm, no m/r/g Pulmonary/Chest: normal work of breathing on room air, lungs clear to auscultation bilaterally Abdominal: soft, non-tender, non-distended Pelvic: Normal appearance of the visualized cervix and vagina, no active bleeding or bloody discharge identified. Extremities: No edema of the lower extremities bilaterally Skin: warm and dry Psych: Sad mood, depressed affect   Assessment & Plan:  Essential hypertension Patient states she is currently taking chlorthalidone and losartan for her blood pressure.  She states she took her medications this morning.  Hypertensive to as much is 173/117 today.  Given her history of stroke is can be important to bring in her blood pressure. Plan: Increase losartan to 50 mg daily BMP today Follow-up 1 month   Primary osteoarthritis involving multiple joints Patient endorses having diffuse pain in the right hip, right knee, right ankle, and lower back.  No morning stiffness, worse with motion.  Prior imaging showing degenerative changes.  Most likely osteoarthritis.  No history of gout or inflammatory arthropathy. Plan: Voltaren gel  Discussed use of over-the-counter analgesics Discussed disease process and natural course of arthritis. May benefit from physical therapy, possible injections if she fails to improve by next visit.   Hyperlipidemia Patient has resumed her high intensity statin (Lipitor 80 mg).  Her LDL was increased at last visit, given her history of stroke and significant hypertension and will be important to get her below goal LDL for secondary prevention. Plan: Lipid panel Continue atorvastatin 80   Postmenopausal bleeding Patient  endorses an episode of recent postmenopausal bleeding.  She had been followed in the past for dysmenorrhea, and underwent a workup with her OB/GYN for bleeding about 4 years ago.  Unfortunately she never followed up with them.  She states that she has had no bleeding since that time, but does endorse clear discharge.  Denies recent sexual activity.  Most recent Pap NILM, HPV negative.  Denies dysuria or urinary symptoms.  Denies any other episodes of bleeding.  She states she felt a gush of fluid, and had some pink discharge/bleeding when bending over about 4 days ago. Speculum exam today was relatively unremarkable.  There was some nonbloody vaginal discharge which was swabbed, will be sent for cytology.  Plan: Referral to reestablish, follow with her OB/GYN Vaginal swab Pelvic ultrasound including transvaginal. CBC   Anxiety and depression Patient endorses ongoing anxiety and depression.  She states that she currently lives with her sister which is stressful for her, and a source of emotional pain.  She does follow with our social worker/therapist, which she finds very helpful.  She was referred to psychiatry at last visit and will establish with them next month.  She is on fluoxetine 20 mg currently.  She denies SI or thoughts of harming herself.  Patient is upset about her mood, asks what she can do for this.  We discussed that mood disorders can be difficult to treat especially if there is an underlying reason or stressor-such as in this patient who has a less than ideal living situation currently.  We discussed that diet and exercise, medication, therapy, and behavioral modifications are the mainstays of treatment for anxiety and depression.  Plan: Continue fluoxetine 20, consider up titration at next visit Patient to establish with psychiatry Continue visits with integrated behavioral health 4-week follow-up to discuss her mood and pain in more detail.     Patient seen with Dr.  Heide Scales MD [facility] Internal Medicine  PGY-1 Pager: [phone]  Phone: [phone] Date 03/30/2023  Time 11:37 AM

## 2023-03-30 NOTE — Addendum Note (Signed)
Addended by: Bufford Spikes on: 03/30/2023 11:45 AM   Modules accepted: Orders

## 2023-03-30 NOTE — Assessment & Plan Note (Signed)
Patient has resumed her high intensity statin (Lipitor 80 mg).  Her LDL was increased at last visit, given her history of stroke and significant hypertension and will be important to get her below goal LDL for secondary prevention. Plan: Lipid panel Continue atorvastatin 80

## 2023-03-30 NOTE — Assessment & Plan Note (Signed)
Patient endorses having diffuse pain in the right hip, right knee, right ankle, and lower back.  No morning stiffness, worse with motion.  Prior imaging showing degenerative changes.  Most likely osteoarthritis.  No history of gout or inflammatory arthropathy. Plan: Voltaren gel Discussed use of over-the-counter analgesics Discussed disease process and natural course of arthritis. May benefit from physical therapy, possible injections if she fails to improve by next visit.

## 2023-03-30 NOTE — Assessment & Plan Note (Signed)
Patient endorses ongoing anxiety and depression.  She states that she currently lives with her sister which is stressful for her, and a source of emotional pain.  She does follow with our social worker/therapist, which she finds very helpful.  She was referred to psychiatry at last visit and will establish with them next month.  She is on fluoxetine 20 mg currently.  She denies SI or thoughts of harming herself.  Patient is upset about her mood, asks what she can do for this.  We discussed that mood disorders can be difficult to treat especially if there is an underlying reason or stressor-such as in this patient who has a less than ideal living situation currently.  We discussed that diet and exercise, medication, therapy, and behavioral modifications are the mainstays of treatment for anxiety and depression.  Plan: Continue fluoxetine 20, consider up titration at next visit Patient to establish with psychiatry Continue visits with integrated behavioral health 4-week follow-up to discuss her mood and pain in more detail.

## 2023-03-30 NOTE — Addendum Note (Signed)
Addended by: Maura Crandall on: 03/30/2023 11:45 AM   Modules accepted: Orders

## 2023-03-31 LAB — CERVICOVAGINAL ANCILLARY ONLY
Bacterial Vaginitis (gardnerella): NEGATIVE
Candida Glabrata: NEGATIVE
Candida Vaginitis: NEGATIVE
Chlamydia: NEGATIVE
Comment: NEGATIVE
Comment: NEGATIVE
Comment: NEGATIVE
Comment: NEGATIVE
Comment: NEGATIVE
Comment: NORMAL
Neisseria Gonorrhea: NEGATIVE
Trichomonas: NEGATIVE

## 2023-04-01 ENCOUNTER — Telehealth: Payer: Self-pay | Admitting: Student

## 2023-04-01 LAB — LIPID PANEL
Chol/HDL Ratio: 3.9 {ratio} (ref 0.0–4.4)
Cholesterol, Total: 232 mg/dL — ABNORMAL HIGH (ref 100–199)
HDL: 59 mg/dL (ref >39)
LDL Chol Calc (NIH): 162 mg/dL — ABNORMAL HIGH (ref 0–99)
Triglycerides: 63 mg/dL (ref 0–149)
VLDL Cholesterol Cal: 11 mg/dL (ref 5–40)

## 2023-04-01 LAB — BMP8+ANION GAP
Anion Gap: 16 mmol/L (ref 10.0–18.0)
BUN/Creatinine Ratio: 14 (ref 9–23)
BUN: 10 mg/dL (ref 6–24)
CO2: 20 mmol/L (ref 20–29)
Calcium: 9.7 mg/dL (ref 8.7–10.2)
Chloride: 105 mmol/L (ref 96–106)
Creatinine, Ser: 0.69 mg/dL (ref 0.57–1.00)
Glucose: 90 mg/dL (ref 70–99)
Potassium: 4.1 mmol/L (ref 3.5–5.2)
Sodium: 141 mmol/L (ref 134–144)
eGFR: 104 mL/min/{1.73_m2} (ref >59)

## 2023-04-01 LAB — CBC
Hematocrit: 43.6 % (ref 34.0–46.6)
Hemoglobin: 14.4 g/dL (ref 11.1–15.9)
MCH: 30.4 pg (ref 26.6–33.0)
MCHC: 33 g/dL (ref 31.5–35.7)
MCV: 92 fL (ref 79–97)
Platelets: 230 10*3/uL (ref 150–450)
RBC: 4.74 x10E6/uL (ref 3.77–5.28)
RDW: 13 % (ref 11.7–15.4)
WBC: 3.5 10*3/uL (ref 3.4–10.8)

## 2023-04-01 NOTE — Telephone Encounter (Signed)
I spoke with Tina Moss on the phone. Patient's identity was confirmed using two patient specific identifiers. We discussed her lab results. CBC, BMP, and Vaginal swab all unremarkable. Her cholesterol is persistently elevated. Patient admits they have not been consistent with their statin - discussed importance and urged compliance.

## 2023-04-04 ENCOUNTER — Other Ambulatory Visit: Payer: Self-pay

## 2023-04-04 NOTE — Progress Notes (Signed)
Internal Medicine Clinic Attending  I was physically present during the key portions of the resident provided service and participated in the medical decision making of patient's management care. I reviewed pertinent patient test results.  The assessment, diagnosis, and plan were formulated together and I agree with the documentation in the resident's note.  Narendra, Nischal, MD  

## 2023-04-05 ENCOUNTER — Other Ambulatory Visit (HOSPITAL_COMMUNITY): Payer: Self-pay

## 2023-04-05 ENCOUNTER — Other Ambulatory Visit: Payer: Self-pay

## 2023-04-07 ENCOUNTER — Ambulatory Visit (INDEPENDENT_AMBULATORY_CARE_PROVIDER_SITE_OTHER): Payer: Medicaid Other | Admitting: Licensed Clinical Social Worker

## 2023-04-07 DIAGNOSIS — F32A Depression, unspecified: Secondary | ICD-10-CM

## 2023-04-07 DIAGNOSIS — F419 Anxiety disorder, unspecified: Secondary | ICD-10-CM

## 2023-04-07 NOTE — BH Specialist Note (Signed)
Integrated Behavioral Health via Telemedicine Visit  04/07/2023 Tina Moss [id number]  Number of Integrated Behavioral Health Clinician visits: 2- Second Visit  Session Start time: 1500   Session End time: 1600  Total time in minutes: 60   Referring Provider: Carmina Miller, DO   Patient/Family location: Home [facility] Provider location: Office All persons participating in visit: [facility] and Patient Types of Service: Introduction only   I connected with Tina Moss  Telephone and verified that I am speaking with the correct person using two identifiers. Discussed confidentiality: Yes    I discussed the limitations of telemedicine and the availability of in person appointments.  Discussed there is a possibility of technology failure and discussed alternative modes of communication if that failure occurs.   I discussed that engaging in this telemedicine visit, they consent to the provision of behavioral healthcare and the services will be billed under their insurance.   Patient and/or legal guardian expressed understanding and consented to Telemedicine visit: Yes    Presenting Concerns: Patient and/or family reports the following symptoms/concerns: During today's follow-up session, the patient continued to work on addressing complex family dynamics and processing past trauma. We explored how generational trauma has influenced their family relationships, discussing patterns of communication, emotional expression, and coping mechanisms that may have been passed down through generations. The patient showed insight into how these dynamics have shaped their current interactions and emotional responses. We also focused on anxiety management techniques, revisiting and refining strategies to help the patient cope with stress related to family issues and past experiences. Additionally, we discussed the ongoing process of applying for disability benefits, addressing any concerns or questions the  patient had about the application. The patient demonstrated commitment to their therapeutic goals, actively engaging in discussions about breaking unhealthy family patterns and developing more adaptive coping skills3. We emphasized the importance of self-care and establishing healthy boundaries as part of the healing process. Moving forward, we will continue to work on processing trauma, improving family communication, and building resilience. Duration of problem: Less than 5 years; Severity of problem: moderate   Patient and/or Family's Strengths/Protective Factors: Social connections   Goals Addressed: Patient will:  Reduce symptoms of: depression   Increase knowledge and/or ability of: coping skills   Demonstrate ability to: Increase healthy adjustment to current life circumstances   Progress towards Goals: Ongoing   Interventions: Interventions utilized:  CBT Cognitive Behavioral Therapy Standardized Assessments completed: PHQ-SADS   Patient and/or Family Response: Patient agrees to therapy   Assessment: Patient currently experiencing Depression.    Patient may benefit from CBT.   Plan: Follow up with behavioral health clinician on : with the next 30 days.   I discussed the assessment and treatment plan with the patient and/or parent/guardian. They were provided an opportunity to ask questions and all were answered. They agreed with the plan and demonstrated an understanding of the instructions.   They were advised to call back or seek an in-person evaluation if the symptoms worsen or if the condition fails to improve as anticipated. [name], MSW, LCSW-A She/Her Behavioral Health Clinician [facility]  Internal Medicine Center Direct [phone]  Fax [phone] Main Office Phone: [phone] [address] [place] [place], [place] [address] Website: [facility] Internal Medicine [facility]  [place], [place]  [place]

## 2023-04-12 ENCOUNTER — Telehealth: Payer: Self-pay

## 2023-04-12 NOTE — Telephone Encounter (Signed)
Left message regarding sooner appt

## 2023-04-14 ENCOUNTER — Ambulatory Visit: Payer: Medicaid Other | Admitting: Physician Assistant

## 2023-04-14 ENCOUNTER — Other Ambulatory Visit: Payer: Self-pay

## 2023-04-18 ENCOUNTER — Other Ambulatory Visit: Payer: Self-pay

## 2023-04-20 ENCOUNTER — Ambulatory Visit (INDEPENDENT_AMBULATORY_CARE_PROVIDER_SITE_OTHER): Payer: Medicaid Other | Admitting: Licensed Clinical Social Worker

## 2023-04-20 DIAGNOSIS — F439 Reaction to severe stress, unspecified: Secondary | ICD-10-CM | POA: Diagnosis present

## 2023-04-20 NOTE — BH Specialist Note (Signed)
Integrated Behavioral Health Follow Up In-Person Visit  MRN: [id number] Name: Tina Moss  Number of Integrated Behavioral Health Clinician visits: 2- Second Visit  Session Start time: 1330   Session End time: 1430  Total time in minutes: 60   Types of Service: Individual psychotherapy  Interpretor:No. Interpretor Name and Language: N/A  Subjective: Tina Moss is a 52 y.o. female  Patient was referred by PCP  Patient reports the following symptoms/concerns: On Wednesday, April 20, 2023, the patient presented for a follow-up appointment, reporting ongoing conflicts with family members and continued efforts to secure stable housing. The patient is currently on the housing authority waitlist and expresses gratitude for the support received from friends during this challenging time. Family dynamics remain strained, with the patient describing their family as overbearing and controlling. A particular source of tension is a parent's distress over recent election results, which appears to be exacerbating the overall family conflict. Despite these difficulties, the patient mentions a scheduled appointment for a disability appeal, which could potentially improve their financial situation. On a positive note, the patient reports that phone calls from their son are a source of emotional comfort and support. This connection appears to be a significant protective factor in the patient's current circumstances. Moving forward, it will be important to continue monitoring the patient's housing search, provide strategies for managing family conflicts, offer support for the upcoming disability appeal, and encourage the maintenance of positive social connections, particularly with the son. Duration of problem: Over 1 year; Severity of problem: moderate  Objective: Mood: NA and Affect: Appropriate Risk of harm to self or others: No plan to harm self or others    Patient and/or Family's  Strengths/Protective Factors: Social connections  Goals Addressed: Patient will:  Reduce symptoms of: stress   Increase knowledge and/or ability of: coping skills   Demonstrate ability to: Increase healthy adjustment to current life circumstances  Progress towards Goals: Ongoing  Interventions: Interventions utilized:  CBT Cognitive Behavioral Therapy Standardized Assessments completed: Not Needed  Patient  Response: Patient agrees to ongoing therapy   Assessment: Patient currently experiencing Stress related to housing instability.   Patient may benefit from ongoing therapy.  Plan: Follow up with behavioral health clinician on : within the next 30 days  [name], MSW, LCSW-A She/Her Behavioral Health Clinician [facility]  Internal Medicine Center Direct [phone]  Fax [phone] Main Office Phone: [phone] [address] [place] [place], [place] [address] Website: [facility] Internal Medicine [facility]  [place], [place]  [place]

## 2023-04-27 ENCOUNTER — Other Ambulatory Visit: Payer: Self-pay

## 2023-05-09 ENCOUNTER — Other Ambulatory Visit: Payer: Self-pay

## 2023-05-09 ENCOUNTER — Ambulatory Visit (HOSPITAL_COMMUNITY): Payer: Medicaid Other | Admitting: Clinical

## 2023-05-09 ENCOUNTER — Ambulatory Visit (HOSPITAL_COMMUNITY)
Admission: RE | Admit: 2023-05-09 | Discharge: 2023-05-09 | Disposition: A | Discharge status: Home / Self Care | Payer: Medicaid Other | Source: Ambulatory Visit | Attending: Internal Medicine | Admitting: Internal Medicine

## 2023-05-09 DIAGNOSIS — N950 Postmenopausal bleeding: Secondary | ICD-10-CM | POA: Diagnosis present

## 2023-05-18 ENCOUNTER — Ambulatory Visit: Payer: Medicaid Other | Admitting: Licensed Clinical Social Worker

## 2023-05-18 ENCOUNTER — Other Ambulatory Visit: Payer: Self-pay

## 2023-05-20 ENCOUNTER — Encounter: Payer: Self-pay | Admitting: Student

## 2023-05-20 ENCOUNTER — Ambulatory Visit (INDEPENDENT_AMBULATORY_CARE_PROVIDER_SITE_OTHER): Payer: Medicaid Other | Admitting: Student

## 2023-05-20 ENCOUNTER — Telehealth: Payer: Self-pay | Admitting: Student

## 2023-05-20 ENCOUNTER — Ambulatory Visit: Payer: Self-pay

## 2023-05-20 ENCOUNTER — Ambulatory Visit: Payer: Medicaid Other | Admitting: Physician Assistant

## 2023-05-20 DIAGNOSIS — J069 Acute upper respiratory infection, unspecified: Secondary | ICD-10-CM | POA: Diagnosis not present

## 2023-05-20 NOTE — Telephone Encounter (Signed)
Pt requesting a call back. Pt has been under the weather x 3 days with a runny nose, congestion, cough.

## 2023-05-20 NOTE — Telephone Encounter (Signed)
Summary: very congested and has a cough       Pt stated she is very congested and has a cough; when blowing her nose, she has seen strings of blood. Pt recently transferred to the [facility] Medicine [facility] from [facility].  I suggested to her that she should contact them to schedule an appointment.  However, pt was seeking clinical advice.      Called pt - left message to return call if she has not gotten an appt, or has any unanswered questions.  I will close out encounter.

## 2023-05-20 NOTE — Progress Notes (Signed)
  [facility] Internal Medicine Residency Telephone Encounter Continuity Care Appointment  HPI:  This telephone encounter was created for [name] on 05/20/2023 for the following purpose/cc congestion .   Past Medical History:  Past Medical History:  Diagnosis Date   Anemia    CVA (cerebral vascular accident) (HCC)    2020   Epilepsy (HCC)    Fibroids    H/O bacterial infection    H/O mumps    Hyperlipidemia    Hypertension    Seizures (HCC)      ROS:     Assessment / Plan / Recommendations:  Viral URI Patien t reports that she has had a four day course of nasal congestion, post nasal drip, and mild cough after a sore cough that has now resolved. Mucus is clear. She also endorses myalgias but no fever, chills, chest pain, or shortness of breath. She can perform her activities of daily living and has no GI disturbances. She has tried some Theraflu but no other therapies.  On the phone she sounds congested but able to speak in full sentences. She has not measured her blood pressure but has been on all her medicines. Discussed the following supportive therapies for this likely viral URI -Coricidin HBP or Nyquill HBP -Neti pot to aid with nasal irrigation -if dyspnea or worsening of symptoms, patient is to call back to after hours number for triage or get evaluated at UC over the weekend   As always, pt is advised that if symptoms worsen or new symptoms arise, they should go to an urgent care facility or to to ER for further evaluation.   Consent and Medical Decision Making:  Patient discussed with Dr. Oswaldo Done This is a telephone encounter between Tina Moss and Morene Crocker ,MD on 05/20/2023 for viral URI. The visit was conducted with the patient located at home and Morene Crocker ,MD at [facility]. The patient's identity was confirmed using their DOB and current address. The patient has consented to being evaluated through a telephone encounter and  understands the associated risks (an examination cannot be done and the patient may need to come in for an appointment) / benefits (allows the patient to remain at home, decreasing exposure to coronavirus). I personally spent 15 minutes on medical discussion.

## 2023-05-20 NOTE — Telephone Encounter (Signed)
  Chief Complaint: Cough Symptoms:  Frequency: 3 days Pertinent Negatives: Patient denies  Disposition: [] ED /[] Urgent Care (no appt availability in office) / [] Appointment(In office/virtual)/ []  [place] [facility]/ [] Home Care/ [] Refused Recommended Disposition /[] [place] [facility]/ []  Follow-up with PCP Additional Notes: Called pt back and began triage. Pt placed me on hold to take an incoming call from [employer].  Held call fro a few minutes, then disconnected.    Summary: very congested and has a cough     Pt stated she is very congested and has a cough; when blowing her nose, she has seen strings of blood. Pt recently transferred to the [facility] Medicine [facility] from [facility].  I suggested to her that she should contact them to schedule an appointment.  However, pt was seeking clinical advice.

## 2023-05-20 NOTE — Assessment & Plan Note (Signed)
Patien t reports that she has had a four day course of nasal congestion, post nasal drip, and mild cough after a sore cough that has now resolved. Mucus is clear. She also endorses myalgias but no fever, chills, chest pain, or shortness of breath. She can perform her activities of daily living and has no GI disturbances. She has tried some Theraflu but no other therapies.  On the phone she sounds congested but able to speak in full sentences. She has not measured her blood pressure but has been on all her medicines. Discussed the following supportive therapies for this likely viral URI -Coricidin HBP or Nyquill HBP -Neti pot to aid with nasal irrigation -if dyspnea or worsening of symptoms, patient is to call back to after hours number for triage or get evaluated at UC over the weekend

## 2023-05-20 NOTE — Telephone Encounter (Addendum)
Return pt's call. Not feeling well. Stated it started about 4 days ago with a sore throat. She has taken Theraflu, cough drops, and ginger ale. Stated she felt warm about 2 days ago; not now. C/o P cough of yellow-green phlegm,running nose, back pain, hoarseness. She has not taken a covid test; stated she has not been around anyone. Stated she needs something for the cough. Pt instructed to rest and drink fluids.Telehealth appt given for this afternoon @ 1500 PM with Dr Daiva Eves.

## 2023-05-20 NOTE — Progress Notes (Incomplete)
Assessment/Plan:   Memory Impairment   Tina Moss is a very pleasant 52 y.o. RH female with a history of*** presenting today in follow-up for evaluation of memory loss. Patient is on ***     Recommendations:   Follow up in   months. Continue donepezil 10 mg daily. Side effects were discussed  Recommend good control of cardiovascular risk factors Continue to control mood as per PCP and BH Monitor driving    Subjective:   This patient is here alone. Previous records as well as any outside records available were reviewed prior to todays visit.   Patient was last seen on 11/18/22  with MMSE 28/30***    Any changes in memory since last visit? ".  She continues to write down a list to not forget.  She continues to have issues with comprehension but overall is stable from prior.  She enjoys reading letters, cards. repeats oneself?  Endorsed Disoriented when walking into a room?  Patient denies ***  Misplacing objects?  Endorsed, suggest glasses, papers, medicines they does not find them for a while.  Wandering behavior?   denies   Any personality changes since last visit?   denies   Any worsening depression?:  Endorsed, sees BH.  They are family issues  that contribute d to her depression.  She does not wish to elaborate that is handled by counseling. Hallucinations or paranoia?  denies   Seizures?   denies    Any sleep changes?   Denies vivid dreams, REM behavior or sleepwalking   Sleep apnea?   denies ***  Any hygiene concerns?   denies   Independent of bathing and dressing?  Endorsed  Does the patient needs help with medications? Patient is in charge, sometimes she misses doses*** Who is in charge of the finances?  Mother is in charge   *** Any changes in appetite?  She does not eat as much as before.***   Patient have trouble swallowing?  denies   Does the patient cook?  Yes any kitchen accidents such as leaving the stove on?   denies   Any headaches?    denies   Vision  changes? denies Chronic pain?  Endorsed, she has chronic back pain as well as knee pain, does physical therapy at home*** Ambulates with difficulty?    Due to chronic pain, she may ambulate with some difficulty.***  Recent falls or head injuries?    denies      Unilateral weakness, numbness or tingling?   denies   Any tremors?  denies   Any anosmia?    denies   Any incontinence of urine?  Endorsed, wears a pad Any bowel dysfunction?  Chronic constipation Patient lives with her sister   *** Does the patient drive?  Endorsed, sometimes she does not remember where she is supposed to go***     Initial visit 09/10/2021 the patient is seen in neurologic consultation at the request of Hoy Register, MD for the evaluation of memory.  The patient is here alone. This is a 52 y.o. year old RH  female who has no recalling when her memory issues began, "I will send my by Dr. Here".  She reports that she is very distracted, forgets appointments or things to do, forgets the time and she is unable to comprehend well.  She states that when she was a child, she was diagnosed with epilepsy, but she is not aware when she stopped taking medications for it.  When she was  in grade school, she receives special education "I had a Teacher, English as a foreign language ".  Of note, during this visit, the patient does have difficulty comprehending, expressing as well as being incoherent at times.  She works at [employer], and has noted that lately she has been burning some of the biscuits.  When she drives, sometimes she does not know where she goes with the car and has to stop, and she reports that occasionally she has a "blank ".  She leaves objects "everywhere, the other day I left my keys in the drawer, and papers near the mattress ".  She ambulates without difficulty, although in the past she had right-sided weakness without residual due to the stroke.  Her mood is depressed, denies any intermittent irritability.  She sleeps  fairly well, denies vivid dreams or sleepwalking.  She does have some hallucinations seeing "floating blocks, little colors dancing ".  Sometimes, she reports being paranoid.  She denies any hygiene concerns, she is independent of bathing and dressing.  The medications are in a pillbox, but she forgets sometimes to take them.  When asked about who is in charge of the finances, incoherent answer is reported, stating "there are calls that the insurance lady takes care of ".  She cannot give a straight answer regarding finances.  Appetite is good, denies trouble swallowing.  She denies any headaches, "sometimes I have double vision" denies any dizziness, focal numbness or tingling, or unilateral weakness.  She denies tremors or anosmia.  Denies urine incontinence, sometimes she has intermittent constipation and diarrhea.  Denies sleep apnea, or alcohol.  She smokes less than a pack a day "I used to smoke more ".    MRI brain 04/03/21 No acute intracranial abnormality. 2. 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 3. Mild right frontal paranasal sinus inflammation.   MRI of the brain March 2023 1. No acute intracranial pathology. No significant change since the study from 04/03/2021. 2. 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  Past Medical History:  Diagnosis Date   Anemia    CVA (cerebral vascular accident) (HCC)    2020   Epilepsy (HCC)    Fibroids    H/O bacterial infection    H/O mumps    Hyperlipidemia    Hypertension    Seizures (HCC)      Past Surgical History:  Procedure Laterality Date   CESAREAN SECTION     x2. at term   TEE WITHOUT CARDIOVERSION N/A 04/06/2018   Procedure: TRANSESOPHAGEAL ECHOCARDIOGRAM  (TEE) BUBBLE STUDY;  Surgeon: Lewayne Bunting, MD;  [facility] ENDOSCOPY;  Service: Cardiovascular;  Laterality: N/A;   TUBAL LIGATION     WISDOM TOOTH EXTRACTION       PREVIOUS MEDICATIONS:   CURRENT MEDICATIONS:  Outpatient Encounter Medications as of 05/20/2023  Medication Sig   acetaminophen-codeine (TYLENOL #3) 300-30 MG tablet Take 1 tablet by mouth 2 (two) times daily as needed for moderate pain.   atorvastatin (LIPITOR) 80 MG tablet Take 1 tablet (80 mg total) by mouth daily.   cetirizine (ZYRTEC) 10 MG tablet Take 1 tablet (10 mg total) by mouth daily.   chlorthalidone (HYGROTON) 25 MG tablet Take 1 tablet (25 mg total) by mouth daily.   clopidogrel (PLAVIX) 75  MG tablet Take 1 tablet (75 mg total) by mouth daily.   diclofenac Sodium (VOLTAREN) 1 % GEL Apply 2 g topically 4 (four) times daily.   donepezil (ARICEPT) 10 MG tablet Take 1 tablet (10 mg total) by mouth daily.   FLUoxetine (PROZAC) 20 MG capsule Take 1 capsule (20 mg total) by mouth daily.   gabapentin (NEURONTIN) 300 MG capsule Take 2 capsules (600 mg total) by mouth 2 (two) times daily.   hydrOXYzine (ATARAX) 10 MG tablet Take 1 tablet (10 mg total) by mouth 3 (three) times daily as needed.   losartan (COZAAR) 50 MG tablet Take 1 tablet (50 mg total) by mouth daily.   metoprolol tartrate (LOPRESSOR) 50 MG tablet Take 1 tablet (50 mg total) by mouth 2 (two) times daily.   oxybutynin (DITROPAN XL) 5 MG 24 hr tablet Take 1 tablet (5 mg total) by mouth at bedtime. For overactive bladder   No facility-administered encounter medications on file as of 05/20/2023.     Objective:     PHYSICAL EXAMINATION:    VITALS:  There were no vitals filed for this visit.  GEN:  The patient appears stated age and is in NAD. HEENT:  Normocephalic, atraumatic.   Neurological examination:  General: NAD, well-groomed, appears stated age. Orientation: The patient is alert. Oriented to person, place and date Cranial nerves:  There is good facial symmetry.The speech is fluent and clear. No aphasia or dysarthria. Fund of knowledge is appropriate. Recent memory impaired and remote memory is normal.  Attention and concentration are normal.  Able to name objects and repeat phrases.  Hearing is intact to conversational tone ***.   Delayed recall *** Sensation: Sensation is intact to light touch throughout Motor: Strength is at least antigravity x4. DTR's 2/4 in UE/LE      05/25/2022   11:21 AM 05/19/2022    2:00 PM 09/10/2021    8:00 AM  Montreal Cognitive Assessment   Visuospatial/ Executive (0/5) 2 2 3   Naming (0/3) 1 1 2   Attention: Read list of digits (0/2) 2 2 2   Attention: Read list of letters (0/1) 1 1 1   Attention: Serial 7 subtraction starting at 100 (0/3) 0 0 0  Language: Repeat phrase (0/2) 1 1 0  Language : Fluency (0/1) 0 0 1  Abstraction (0/2) 0 0 0  Delayed Recall (0/5) 4 4 2   Orientation (0/6) 5 5 6   Total 16 16 17   Adjusted Score (based on education) 17 16 18        11/18/2022    3:00 PM 09/09/2021   10:56 AM 02/16/2017   12:00 PM  MMSE - Mini Mental State Exam  Not completed:   Unable to complete  Orientation to time 5 4   Orientation to Place 5 5   Registration 3 3   Attention/ Calculation 5 5   Recall 1 2   Language- name 2 objects 2 2   Language- repeat 1 1   Language- follow 3 step command 3 3   Language- read & follow direction 1 1   Write a sentence 1 1   Copy design 1 1   Total score 28 28        Movement examination: Tone: There is normal tone in the UE/LE Abnormal movements:  no tremor.  No myoclonus.  No asterixis.   Coordination:  There is no decremation with RAM's. Normal finger to nose  Gait and Station: The patient has no difficulty arising out of a  deep-seated chair without the use of the hands. The patient's stride length is good.  Gait is cautious and narrow.   Thank you for allowing [place] the opportunity to participate in the care of this nice patient. Please do not  hesitate to contact us for any questions or concerns.   Total time spent on today's visit was *** minutes dedicated to this patient today, preparing to see patient, examining the patient, ordering tests and/or medications and counseling the patient, documenting clinical information in the EHR or other health record, independently interpreting results and communicating results to the patient/family, discussing treatment and goals, answering patient's questions and coordinating care.  Cc:  Carmina Miller, DO  [facility] 05/20/2023 6:01 AM

## 2023-05-20 NOTE — Telephone Encounter (Signed)
Summary: very congested and has a cough   Pt stated she is very congested and has a cough; when blowing her nose, she has seen strings of blood. Pt recently transferred to the [facility] Medicine [facility] from [facility].  I suggested to her that she should contact them to schedule an appointment.  However, pt was seeking clinical advice.         Called pt and left message on machine to return call.

## 2023-05-23 NOTE — Progress Notes (Signed)
 Internal Medicine Clinic Attending  Case discussed with the resident physician at the time of the visit.  We reviewed the patient's history, exam, and pertinent patient test results.  I agree with the assessment, diagnosis, and plan of care documented in the resident's note.

## 2023-05-24 ENCOUNTER — Encounter: Payer: Medicaid Other | Admitting: Obstetrics and Gynecology

## 2023-05-26 ENCOUNTER — Ambulatory Visit: Payer: Medicaid Other | Admitting: Licensed Clinical Social Worker

## 2023-05-27 ENCOUNTER — Other Ambulatory Visit: Payer: Self-pay

## 2023-05-30 ENCOUNTER — Other Ambulatory Visit: Payer: Self-pay

## 2023-05-30 ENCOUNTER — Ambulatory Visit: Payer: Medicaid Other | Admitting: Student

## 2023-05-30 VITALS — BP 151/103 | HR 96 | Temp 97.7°F | Ht 67.0 in | Wt 208.7 lb

## 2023-05-30 DIAGNOSIS — I10 Essential (primary) hypertension: Secondary | ICD-10-CM | POA: Diagnosis present

## 2023-05-30 DIAGNOSIS — N939 Abnormal uterine and vaginal bleeding, unspecified: Secondary | ICD-10-CM | POA: Diagnosis not present

## 2023-05-30 NOTE — Progress Notes (Unsigned)
CC: Follow-up  HPI:  [name] is a 52 y.o. female living with a history stated below and presents today for a follow-up. Please see problem based assessment and plan for additional details.  Past Medical History:  Diagnosis Date   Anemia    CVA (cerebral vascular accident) (HCC)    2020   Epilepsy (HCC)    Fibroids    H/O bacterial infection    H/O mumps    Hyperlipidemia    Hypertension    Seizures (HCC)     Current Outpatient Medications on File Prior to Visit  Medication Sig Dispense Refill   acetaminophen-codeine (TYLENOL #3) 300-30 MG tablet Take 1 tablet by mouth 2 (two) times daily as needed for moderate pain. 30 tablet 0   atorvastatin (LIPITOR) 80 MG tablet Take 1 tablet (80 mg total) by mouth daily. 30 tablet 3   cetirizine (ZYRTEC) 10 MG tablet Take 1 tablet (10 mg total) by mouth daily. 30 tablet 1   chlorthalidone (HYGROTON) 25 MG tablet Take 1 tablet (25 mg total) by mouth daily. 90 tablet 1   clopidogrel (PLAVIX) 75 MG tablet Take 1 tablet (75 mg total) by mouth daily. 90 tablet 1   diclofenac Sodium (VOLTAREN) 1 % GEL Apply 2 g topically 4 (four) times daily. 100 g 2   donepezil (ARICEPT) 10 MG tablet Take 1 tablet (10 mg total) by mouth daily. 30 tablet 11   FLUoxetine (PROZAC) 20 MG capsule Take 1 capsule (20 mg total) by mouth daily. 90 capsule 1   gabapentin (NEURONTIN) 300 MG capsule Take 2 capsules (600 mg total) by mouth 2 (two) times daily. 60 capsule 5   hydrOXYzine (ATARAX) 10 MG tablet Take 1 tablet (10 mg total) by mouth 3 (three) times daily as needed. 60 tablet 1   losartan (COZAAR) 50 MG tablet Take 1 tablet (50 mg total) by mouth daily. 30 tablet 3   metoprolol tartrate (LOPRESSOR) 50 MG tablet Take 1 tablet (50 mg total) by mouth 2 (two) times daily. 180 tablet 1   oxybutynin (DITROPAN XL) 5 MG 24 hr tablet Take 1 tablet (5 mg total) by mouth at bedtime. For overactive bladder 30 tablet 1   No current facility-administered medications  on file prior to visit.    Family History  Problem Relation Age of Onset   Hypertension Mother    Hypertension Sister    Arthritis Sister        knees   Mental illness Daughter        ? bipolar   Hypertension Brother    Deafness Brother    Speech disorder Brother        mute   Mental illness Brother        "he can just fly off"   Scoliosis Son    Breast cancer Neg Hx     Social History   Socioeconomic History   Marital status: Divorced    Spouse name: n/a   Number of children: 2   Years of education: 11th grade   Highest education level: 11th grade  Occupational History   Occupation: Housekeeper    Comment: [employer]  Tobacco Use   Smoking status: Some Days    Types: Cigars   Smokeless tobacco: Never   Tobacco comments:    "I think I just keep so much on my mind."  Vaping Use   Vaping status: Never Used  Substance and Sexual Activity   Alcohol use: Yes  Alcohol/week: 0.0 - 3.0 standard drinks of alcohol    Comment: social   Drug use: Yes    Types: Marijuana    Comment: occasionally   Sexual activity: Yes    Partners: Male    Birth control/protection: None  Other Topics Concern   Not on file  Social History Narrative   Lives with her daughter.   Son is currently incarcerated in [place].   She completed 11th grade, but was kicked out and never when back, as she had become pregnant and was raising her daughter.   Divorced x 2.   Right handed   Drinks caffeine   One story home   Social Drivers of Health   Financial Resource Strain: Not on file  Food Insecurity: Patient Declined (05/06/2023)   Hunger Vital Sign    Worried About Running Out of Food in the Last Year: Patient declined    Ran Out of Food in the Last Year: Patient declined  Transportation Needs: Patient Declined (05/06/2023)   PRAPARE - Administrator, Civil Service (Medical): Patient declined    Lack of Transportation (Non-Medical): Patient declined  Physical Activity: Not on file   Stress: Not on file  Social Connections: Not on file  Intimate Partner Violence: Patient Declined (05/06/2023)   Humiliation, Afraid, Rape, and Kick questionnaire    Fear of Current or Ex-Partner: Patient declined    Emotionally Abused: Patient declined    Physically Abused: Patient declined    Sexually Abused: Patient declined    Review of Systems: ROS negative except for what is noted on the assessment and plan.  Vitals:   05/30/23 1011 05/30/23 1025  BP: (!) 157/112 (!) 151/103  Pulse: 95 96  Temp: 97.7 F (36.5 C)   TempSrc: Oral   SpO2: 99%   Weight: 208 lb 11.2 oz (94.7 kg)   Height: 5\' 7"  (1.702 m)     Physical Exam: Constitutional: well-appearing, sitting in chair, in no acute distress HENT: normocephalic atraumatic, mucous membranes moist Eyes: conjunctiva non-erythematous Cardiovascular: regular rate and rhythm, no m/r/g Pulmonary/Chest: normal work of breathing on room air, lungs clear to auscultation bilaterally Abdominal: soft, non-tender, non-distended MSK: normal bulk and tone Neurological: alert & oriented x 3, no focal deficit Skin: warm and dry Psych: normal mood and behavior  Assessment & Plan:     Patient seen with Dr. Cleda Daub  Essential hypertension Patient mildly hypertensive in office but had not taken blood pressure medications since Friday - prescribed Clorthalidone and Losartan. Patient endorses mild anxiety around taking medications in general. Explained the importance of staying consistent with medications and how further health risks will prove anxiety inducing as well. Patient understand importance and will be more consistent.   Abnormal uterine bleeding (AUB) Patient stated bleeding has slowed considerably. Transvaginal US on 11/25 showed   1. Heterogeneous 4.2 cm mid uterine lesion, likely representing a fibroid. This obscures visualization of the endometrium. Further evaluation with pelvic MRI could be obtained, as  clinically indicated.   2. Multiple fibroids were visualized on the prior ultrasound performed in 2018.  Patient scheduled for pelvic MRI 12/26 and will also call to re-schedule OBGYN appointment which was recently missed due to URI.   Carmina Miller, D.O. [facility] Internal Medicine, PGY-1 Phone: [phone] Date 06/02/2023 Time 9:01 AM

## 2023-05-30 NOTE — Patient Instructions (Signed)
Please take all your medication as prescribed.  Please call your OBGYN and reschedule the appointment.  Follow-up in 3 months.

## 2023-06-01 ENCOUNTER — Telehealth: Payer: Self-pay | Admitting: Student

## 2023-06-01 DIAGNOSIS — N950 Postmenopausal bleeding: Secondary | ICD-10-CM

## 2023-06-01 NOTE — Telephone Encounter (Signed)
I spoke with Tina Moss on the phone. Patient's identity was confirmed using two patient specific identifiers. We discussed the results of her ultrasound, specifically that ultrasound was inconclusive and that pelvic MRI may help [place] assess what is going on.  She endorses continued abnormal uterine bleeding from time to time.  She attempted to get in with the OB/GYN clinic, but was told that the clinic she was referred to is only accepting pregnant patients.  I have placed an order for a pelvic MRI as well as a new referral to OB/GYN with instructions to refer the patient to a gynecology clinic who may see her.  Patient may also follow-up with [place] in the [facility] as needed for her ongoing health concerns.  This case was discussed with Dr. Mikey Bussing.

## 2023-06-02 ENCOUNTER — Other Ambulatory Visit: Payer: Self-pay

## 2023-06-02 NOTE — Assessment & Plan Note (Signed)
Patient mildly hypertensive in office but had not taken blood pressure medications since Friday - prescribed Clorthalidone and Losartan. Patient endorses mild anxiety around taking medications in general. Explained the importance of staying consistent with medications and how further health risks will prove anxiety inducing as well. Patient understand importance and will be more consistent.

## 2023-06-02 NOTE — Assessment & Plan Note (Signed)
Patient stated bleeding has slowed considerably. Transvaginal US on 11/25 showed   1. Heterogeneous 4.2 cm mid uterine lesion, likely representing a fibroid. This obscures visualization of the endometrium. Further evaluation with pelvic MRI could be obtained, as clinically indicated.   2. Multiple fibroids were visualized on the prior ultrasound performed in 2018.  Patient scheduled for pelvic MRI 12/26 and will also call to re-schedule OBGYN appointment which was recently missed due to URI.

## 2023-06-04 NOTE — Progress Notes (Signed)
Internal Medicine Clinic Attending  I was physically present during the key portions of the resident provided service and participated in the medical decision making of patient's management care. I reviewed pertinent patient test results.  The assessment, diagnosis, and plan were formulated together and I agree with the documentation in the resident's note.  Gust Rung, DO

## 2023-06-09 ENCOUNTER — Ambulatory Visit (HOSPITAL_COMMUNITY)
Admission: RE | Admit: 2023-06-09 | Discharge: 2023-06-09 | Disposition: A | Discharge status: Home / Self Care | Payer: Medicaid Other | Source: Ambulatory Visit | Attending: Internal Medicine | Admitting: Internal Medicine

## 2023-06-09 DIAGNOSIS — N950 Postmenopausal bleeding: Secondary | ICD-10-CM | POA: Diagnosis present

## 2023-06-09 MED ORDER — GADOBUTROL 1 MMOL/ML IV SOLN
9.0000 mL | Freq: Once | INTRAVENOUS | Status: AC | PRN
  Administered 2023-06-09: 9 mL via INTRAVENOUS

## 2023-06-14 ENCOUNTER — Ambulatory Visit: Payer: Medicaid Other | Admitting: Physician Assistant

## 2023-06-14 ENCOUNTER — Encounter: Payer: Self-pay | Admitting: Physician Assistant

## 2023-06-14 VITALS — BP 144/91 | HR 99 | Ht 66.0 in | Wt 211.0 lb

## 2023-06-14 DIAGNOSIS — R413 Other amnesia: Secondary | ICD-10-CM | POA: Diagnosis not present

## 2023-06-14 NOTE — Progress Notes (Signed)
 Assessment/Plan:   Memory Impairment   Tina Moss is a very pleasant 52 y.o. RH female with a history of hypertension, hyperlipidemia, LM cerebral artery stroke, anemia, Vit D deficiency,  tobacco use,  anxiety, depression, history of fibroids, history of memory impairment of uncertain etiology, history syphilis in June 2023 followed by PCP presenting today in follow-up for evaluation of memory loss. Patient is on donepezil  10 daily.  Memory stable with MMSE 26/30. Mood is controlled, sees a veterinary surgeon. Patient is able to participate on his ADLs and to drive.       Recommendations:   Follow up in  6 months. Continue donepezil  10 mg daily. Side effects were discussed  Recommend good control of cardiovascular risk factors Continue to control mood as per PCP and BH Monitor driving    Subjective:   This patient is here alone. Previous records as well as any outside records available were reviewed prior to todays visit.   Patient was last seen on 11/18/22 with MMSE 28/30    Any changes in memory since last visit?  About the same.  She continues to write down a list to not forget.  She continues to have issues with comprehension but overall is stable from prior.  She enjoys reading letters, cards. repeats oneself?  Endorsed Disoriented when walking into a room?  Patient denies.    Misplacing objects?  Endorsed, sometimes she loses the glasses, papers, medicines then  does not find them for a while.  Wandering behavior?   denies   Any personality changes since last visit? She is going through stress due to health issues (fibroids)  and deaths in family.     Any worsening depression?:  Endorsed, sees BH.  They are family issues that contribute  to her depression.   Hallucinations or paranoia?  denies   Seizures?   denies    Any sleep changes?   Denies vivid dreams, REM behavior or sleepwalking   Sleep apnea?   denies    Any hygiene concerns?   denies   Independent of bathing and  dressing?  Endorsed  Does the patient needs help with medications? Patient is in charge, sometimes she misses doses  Who is in charge of the finances?  Mother is in charge     Any changes in appetite?  She does not eat as much as before.    Patient have trouble swallowing?  denies   Does the patient cook?  Yes any kitchen accidents such as leaving the stove on?   Denies.   Any headaches?    denies   Vision changes? denies Chronic pain?  Endorsed, she has chronic back pain as well as knee pain, does physical therapy at home  Ambulates with difficulty?    Due to chronic pain, she may ambulate with some difficulty.   Recent falls or head injuries?    denies      Unilateral weakness, numbness or tingling?   Denies.   Any tremors?  Denies.   Any anosmia?    denies .  Any incontinence of urine?  Endorsed, wears a pad. Any bowel dysfunction?  Chronic constipation. Patient lives with her sister    Does the patient drive?  Endorsed, sometimes she does not remember where she is supposed to go . I get nervous at the stops     Initial visit 09/10/2021 the patient is seen in neurologic consultation at the request of Newlin, Enobong, MD for the evaluation of memory.  The  patient is here alone. This is a 52 y.o. year old RH  female who has no recalling when her memory issues began, I will send my by Dr. Here.  She reports that she is very distracted, forgets appointments or things to do, forgets the time and she is unable to comprehend well.  She states that when she was a child, she was diagnosed with epilepsy, but she is not aware when she stopped taking medications for it.  When she was in grade school, she receives special education I had a teacher, english as a foreign language .  Of note, during this visit, the patient does have difficulty comprehending, expressing as well as being incoherent at times.  She works at [employer], and has noted that lately she has been burning some of the biscuits.  When she  drives, sometimes she does not know where she goes with the car and has to stop, and she reports that occasionally she has a blank .  She leaves objects everywhere, the other day I left my keys in the drawer, and papers near the mattress .  She ambulates without difficulty, although in the past she had right-sided weakness without residual due to the stroke.  Her mood is depressed, denies any intermittent irritability.  She sleeps fairly well, denies vivid dreams or sleepwalking.  She does have some hallucinations seeing floating blocks, little colors dancing .  Sometimes, she reports being paranoid.  She denies any hygiene concerns, she is independent of bathing and dressing.  The medications are in a pillbox, but she forgets sometimes to take them.  When asked about who is in charge of the finances, incoherent answer is reported, stating there are calls that the insurance lady takes care of .  She cannot give a straight answer regarding finances.  Appetite is good, denies trouble swallowing.  She denies any headaches, sometimes I have double vision denies any dizziness, focal numbness or tingling, or unilateral weakness.  She denies tremors or anosmia.  Denies urine incontinence, sometimes she has intermittent constipation and diarrhea.  Denies sleep apnea, or alcohol.  She smokes less than a pack a day I used to smoke more .    MRI brain 04/03/21 No acute intracranial abnormality. 2. 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 3. Mild right frontal paranasal sinus inflammation.   MRI of the brain March 2023 1. No acute intracranial pathology. No significant change since the study from 04/03/2021. 2. Multiple remote infarcts in the left frontal lobe cortex and bilateral centrum semiovale on a background of age advanced chronic white matter microangiopathy, not significantly changed  since the prior study.  Past Medical History:  Diagnosis Date   Anemia    CVA (cerebral vascular accident) (HCC)    2020   Epilepsy (HCC)    Fibroids    H/O bacterial infection    H/O mumps    Hyperlipidemia    Hypertension    Seizures (HCC)      Past Surgical History:  Procedure Laterality Date   CESAREAN SECTION     x2. at term   TEE WITHOUT CARDIOVERSION N/A 04/06/2018   Procedure: TRANSESOPHAGEAL ECHOCARDIOGRAM (TEE) BUBBLE STUDY;  Surgeon: Pietro Redell RAMAN, MD;  Location: [facility];  Service: Cardiovascular;  Laterality: N/A;   TUBAL LIGATION     WISDOM TOOTH EXTRACTION       PREVIOUS MEDICATIONS:   CURRENT MEDICATIONS:  Outpatient Encounter Medications as of 06/14/2023  Medication Sig   acetaminophen -codeine  (TYLENOL  #3) 300-30 MG tablet Take 1 tablet by mouth 2 (two) times daily as needed for moderate pain.   atorvastatin  (LIPITOR) 80 MG tablet Take 1 tablet (80 mg total) by mouth daily.   cetirizine  (ZYRTEC ) 10 MG tablet Take 1 tablet (10 mg total) by mouth daily.   chlorthalidone  (HYGROTON ) 25 MG tablet Take 1 tablet (25 mg total) by mouth daily.   clopidogrel  (PLAVIX ) 75 MG tablet Take 1 tablet (75 mg total) by mouth daily.   diclofenac  Sodium (VOLTAREN ) 1 % GEL Apply 2 g topically 4 (four) times daily.   donepezil  (ARICEPT ) 10 MG tablet Take 1 tablet (10 mg total) by mouth daily.   FLUoxetine  (PROZAC ) 20 MG capsule Take 1 capsule (20 mg total) by mouth daily.   gabapentin  (NEURONTIN ) 300 MG capsule Take 2 capsules (600 mg total) by mouth 2 (two) times daily.   hydrOXYzine  (ATARAX ) 10 MG tablet Take 1 tablet (10 mg total) by mouth 3 (three) times daily as needed.   losartan  (COZAAR ) 50 MG tablet Take 1 tablet (50 mg total) by mouth daily.   metoprolol  tartrate (LOPRESSOR ) 50 MG tablet Take 1 tablet (50 mg total) by mouth 2 (two) times daily.   oxybutynin  (DITROPAN  XL) 5 MG 24 hr tablet Take 1 tablet (5 mg total) by mouth at bedtime. For overactive bladder    No facility-administered encounter medications on file as of 06/14/2023.     Objective:     PHYSICAL EXAMINATION:    VITALS:   Vitals:   06/14/23 1421  BP: (!) 144/91  Pulse: 99  SpO2: 98%  Weight: 211 lb (95.7 kg)  Height: 5' 6 (1.676 m)    GEN:  The patient appears stated age and is in NAD. HEENT:  Normocephalic, atraumatic.   Neurological examination:  General: NAD, well-groomed, appears stated age. Orientation: The patient is alert. Oriented to person, place and not to date Cranial nerves: There is good facial symmetry.The speech is fluent and clear. No aphasia or dysarthria. Fund of knowledge is appropriate. Recent memory impaired and remote memory is normal.  Attention and concentration are normal.  Able to name objects and repeat phrases.  Hearing is intact to conversational tone.   Delayed recall 2/3 Sensation: Sensation is intact to light touch throughout Motor: Strength is at least antigravity x4. DTR's 2/4 in UE/LE      05/25/2022   11:21 AM 05/19/2022    2:00 PM 09/10/2021    8:00 AM  Montreal Cognitive Assessment   Visuospatial/ Executive (0/5) 2 2 3   Naming (0/3) 1 1 2   Attention: Read list of digits (0/2) 2 2 2   Attention: Read list of letters (0/1) 1 1 1   Attention: Serial 7 subtraction starting at 100 (0/3) 0 0 0  Language: Repeat phrase (0/2) 1 1 0  Language : Fluency (0/1) 0 0 1  Abstraction (0/2) 0 0 0  Delayed Recall (0/5) 4 4 2   Orientation (0/6) 5 5 6   Total 16 16 17   Adjusted Score (based on education) 17 16 18        06/14/2023    2:00 PM 11/18/2022    3:00 PM 09/09/2021   10:56 AM  MMSE - Mini Mental State Exam  Orientation to time 4 5 4   Orientation to Place 4 5 5   Registration 3 3 3   Attention/ Calculation 4 5 5   Recall 2 1 2   Language- name 2 objects  2 2 2   Language- repeat 1 1 1   Language- follow 3 step command 3 3 3   Language- read & follow direction 1 1 1   Write a sentence 1 1 1   Copy design 1 1 1   Total score 26 28 28         Movement examination: Tone: There is normal tone in the UE/LE Abnormal movements:  no tremor.  No myoclonus.  No asterixis.   Coordination:  There is no decremation with RAM's. Normal finger to nose  Gait and Station: The patient has no difficulty arising out of a deep-seated chair without the use of the hands. The patient's stride length is good.  Gait is cautious and narrow.   Thank you for allowing us  the opportunity to participate in the care of this nice patient. Please do not hesitate to contact us  for any questions or concerns.   Total time spent on today's visit was 22 minutes dedicated to this patient today, preparing to see patient, examining the patient, ordering tests and/or medications and counseling the patient, documenting clinical information in the EHR or other health record, independently interpreting results and communicating results to the patient/family, discussing treatment and goals, answering patient's questions and coordinating care.  Cc:  Marylu Gee, DO  [facility] 06/15/2023 12:09 PM

## 2023-06-14 NOTE — Patient Instructions (Signed)
 It was a pleasure to see you today at our office.   Recommendations:  Follow up in  6 months Continue Donepezil   10 mg 1 tablet  daily. Continue psychiatry   Whom to call:  Memory  decline, memory medications: Call our office [phone]   For psychiatric meds, mood meds: Please have your primary care physician manage these medications.   Counseling regarding caregiver distress, including caregiver depression, anxiety and issues regarding community resources, adult day care programs, adult living facilities, or memory care questions:   Feel free to contact Misty Waddell Simmer, Social Worker at [phone]   For assessment of decision of mental capacity and competency:  Call Dr. Rosaline Nine, geriatric psychiatrist at [phone]   If you have any severe symptoms of a stroke, or other severe issues such as confusion,severe chills or fever, etc call 911 or go to the ER as you may need to be evaluated further      RECOMMENDATIONS FOR ALL PATIENTS WITH MEMORY PROBLEMS: 1. Continue to exercise (Recommend 30 minutes of walking everyday, or 3 hours every week) 2. Increase social interactions - continue going to [place] and enjoy social gatherings with friends and family 3. Eat healthy, avoid fried foods and eat more fruits and vegetables 4. Maintain adequate blood pressure, blood sugar, and blood cholesterol level. Reducing the risk of stroke and cardiovascular disease also helps promoting better memory. 5. Avoid stressful situations. Live a simple life and avoid aggravations. Organize your time and prepare for the next day in anticipation. 6. Sleep well, avoid any interruptions of sleep and avoid any distractions in the bedroom that may interfere with adequate sleep quality 7. Avoid sugar, avoid sweets as there is a strong link between excessive sugar intake, diabetes, and cognitive impairment We discussed the Mediterranean diet, which has been shown to help patients reduce the risk  of progressive memory disorders and reduces cardiovascular risk. This includes eating fish, eat fruits and green leafy vegetables, nuts like almonds and hazelnuts, walnuts, and also use olive oil. Avoid fast foods and fried foods as much as possible. Avoid sweets and sugar as sugar use has been linked to worsening of memory function.  There is always a concern of gradual progression of memory problems. If this is the case, then we may need to adjust level of care according to patient needs. Support, both to the patient and caregiver, should then be put into place.    FALL PRECAUTIONS: Be cautious when walking. Scan the area for obstacles that may increase the risk of trips and falls. When getting up in the mornings, sit up at the edge of the bed for a few minutes before getting out of bed. Consider elevating the bed at the head end to avoid drop of blood pressure when getting up. Walk always in a well-lit room (use night lights in the walls). Avoid area rugs or power cords from appliances in the middle of the walkways. Use a walker or a cane if necessary and consider physical therapy for balance exercise. Get your eyesight checked regularly.  FINANCIAL OVERSIGHT: Supervision, especially oversight when making financial decisions or transactions is also recommended.  HOME SAFETY: Consider the safety of the kitchen when operating appliances like stoves, microwave oven, and blender. Consider having supervision and share cooking responsibilities until no longer able to participate in those. Accidents with firearms and other hazards in the house should be identified and addressed as well.   ABILITY TO BE LEFT ALONE: If patient is unable  to contact 911 operator, consider using [facility], or when the need is there, arrange for someone to stay with patients. Smoking is a fire hazard, consider supervision or cessation. Risk of wandering should be assessed by caregiver and if detected at any point, supervision and  safe proof recommendations should be instituted.  MEDICATION SUPERVISION: Inability to self-administer medication needs to be constantly addressed. Implement a mechanism to ensure safe administration of the medications.   DRIVING: Regarding driving, in patients with progressive memory problems, driving will be impaired. We advise to have someone else do the driving if trouble finding directions or if minor accidents are reported. Independent driving assessment is available to determine safety of driving.   If you are interested in the driving assessment, you can contact the following:  [employer] in [place] [phone]  [facility] [phone]  [facility] [phone]  [facility] [phone] or [phone]

## 2023-06-16 ENCOUNTER — Encounter: Payer: Self-pay | Admitting: *Deleted

## 2023-06-16 NOTE — Progress Notes (Signed)
 Pt attended 05/06/23 screening event where her b/p was 168/118 on retake. At the event, the pt noted she did have insurance, was a former smoker, and did not identify a PCP name or any SDOH insecurities. Chart review indicates pt's PCP of record is Dr. Norman Lobstein from the [facility], who saw pt on 05/30/23, when her b/p was 151/103. At that visit, PCP noted: Patient mildly hypertensive in office but had not taken blood pressure medications since Friday - prescribed Clorthalidone and Losartan . Patient endorses mild anxiety around taking medications in general. Explained the importance of staying consistent with medications and how further health risks will prove anxiety inducing as well. Patient understand importance and will be more consistent. Further chart review indicates pt was seen by Neurology on 06/14/23 when her b/p was 144/91. Pt has ongoing BH/counseling support and future appt with OB/GYN, [facility]. No further health equity team support indicated at this time.

## 2023-07-04 ENCOUNTER — Other Ambulatory Visit: Payer: Self-pay

## 2023-07-13 ENCOUNTER — Ambulatory Visit (HOSPITAL_COMMUNITY)
Admission: EM | Admit: 2023-07-13 | Discharge: 2023-07-13 | Disposition: A | Discharge status: Home / Self Care | Payer: Medicaid Other | Attending: Family Medicine | Admitting: Family Medicine

## 2023-07-13 ENCOUNTER — Other Ambulatory Visit: Payer: Self-pay

## 2023-07-13 ENCOUNTER — Ambulatory Visit (INDEPENDENT_AMBULATORY_CARE_PROVIDER_SITE_OTHER): Payer: Medicaid Other

## 2023-07-13 ENCOUNTER — Encounter (HOSPITAL_COMMUNITY): Payer: Self-pay | Admitting: Emergency Medicine

## 2023-07-13 DIAGNOSIS — R051 Acute cough: Secondary | ICD-10-CM

## 2023-07-13 DIAGNOSIS — J069 Acute upper respiratory infection, unspecified: Secondary | ICD-10-CM | POA: Diagnosis not present

## 2023-07-13 LAB — POCT INFLUENZA A/B
Influenza A, POC: NEGATIVE
Influenza B, POC: NEGATIVE

## 2023-07-13 MED ORDER — PREDNISONE 20 MG PO TABS
40.0000 mg | ORAL_TABLET | Freq: Every day | ORAL | 0 refills | Status: AC
  Filled 2023-07-13: qty 10, 5d supply, fill #0

## 2023-07-13 MED ORDER — IPRATROPIUM-ALBUTEROL 0.5-2.5 (3) MG/3ML IN SOLN
3.0000 mL | Freq: Once | RESPIRATORY_TRACT | Status: AC
  Administered 2023-07-13: 3 mL via RESPIRATORY_TRACT

## 2023-07-13 MED ORDER — PROMETHAZINE-DM 6.25-15 MG/5ML PO SYRP
5.0000 mL | ORAL_SOLUTION | Freq: Four times a day (QID) | ORAL | 0 refills | Status: DC | PRN
  Filled 2023-07-13: qty 118, 6d supply, fill #0

## 2023-07-13 MED ORDER — DEXAMETHASONE SODIUM PHOSPHATE 10 MG/ML IJ SOLN
INTRAMUSCULAR | Status: AC
  Filled 2023-07-13: qty 1

## 2023-07-13 MED ORDER — IPRATROPIUM-ALBUTEROL 0.5-2.5 (3) MG/3ML IN SOLN
RESPIRATORY_TRACT | Status: AC
  Filled 2023-07-13: qty 3

## 2023-07-13 MED ORDER — ACETAMINOPHEN 325 MG PO TABS
975.0000 mg | ORAL_TABLET | Freq: Once | ORAL | Status: AC
  Administered 2023-07-13: 975 mg via ORAL

## 2023-07-13 MED ORDER — DEXAMETHASONE SODIUM PHOSPHATE 10 MG/ML IJ SOLN
10.0000 mg | Freq: Once | INTRAMUSCULAR | Status: AC
  Administered 2023-07-13: 10 mg via INTRAMUSCULAR

## 2023-07-13 MED ORDER — ACETAMINOPHEN 325 MG PO TABS
ORAL_TABLET | ORAL | Status: AC
  Filled 2023-07-13: qty 3

## 2023-07-13 MED ORDER — ALBUTEROL SULFATE HFA 108 (90 BASE) MCG/ACT IN AERS
1.0000 | INHALATION_SPRAY | Freq: Four times a day (QID) | RESPIRATORY_TRACT | 0 refills | Status: DC | PRN
  Filled 2023-07-13: qty 18, 25d supply, fill #0

## 2023-07-13 NOTE — Discharge Instructions (Signed)
Your flu testing is negative.  We have given you breathing treatments in clinic and you can use the albuterol inhaler every 6 hours for any wheezing or shortness of breath.  Start the steroids tomorrow and take them daily for the next 5 days.  Use the cough medicine as needed.  Do not hesitate to seek further care at the nearest emergency department if you develop worsening shortness of breath or wheezing despite these interventions.

## 2023-07-13 NOTE — ED Triage Notes (Signed)
Patient c/o body aches, chills, cough, congestion x 3 days.  Patient has taken Mucinex, Dayquil and Nyquil.

## 2023-07-13 NOTE — ED Provider Notes (Signed)
[facility]    CSN: [id number] Arrival date & time: 07/13/23  1324      History   Chief Complaint Chief Complaint  Patient presents with   Cough    HPI Tina Moss is a 53 y.o. female.   Patient presents to clinic complaining of cough, shortness of breath, wheezing, body aches, chills and nasal congestion that have been present since Monday.  She had leaned over to look at something on her sister's phone and noticed that her sister was coughing and congested, felt like she got sick at that time.  Has been taking Mucinex, DayQuil and NyQuil.  Reports she recently stopped smoking cigarettes a few months ago.  No history of asthma or respiratory diseases.  No abdominal pain,or N/V/D.  The history is provided by the patient and medical records.  Cough   Past Medical History:  Diagnosis Date   Anemia    CVA (cerebral vascular accident) (HCC)    2020   Epilepsy (HCC)    Fibroids    H/O bacterial infection    H/O mumps    Hyperlipidemia    Hypertension    Seizures (HCC)     Patient Active Problem List   Diagnosis Date Noted   Viral URI 05/20/2023   Primary osteoarthritis involving multiple joints 03/30/2023   Postmenopausal bleeding 03/30/2023   Anxiety and depression 03/30/2023   Chronic bilateral low back pain with sciatica 07/12/2022   Chronic pain syndrome 07/12/2022   Prediabetes 10/06/2021   Memory impairment 09/10/2021   History of epilepsy 09/10/2021   Vitamin D deficiency 07/17/2020   Anxiety state 07/17/2020   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/2020   Hyperlipidemia 09/18/2019   Acute pain of right knee 09/17/2019   Hip pain, acute, right 09/17/2019   Abnormal uterine bleeding (AUB) 02/13/2019   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   Tobacco use 04/04/2018   Heart palpitations 12/14/2016   Major depressive disorder 11/15/2016   Essential hypertension 11/15/2016   Fibroids 01/18/2012   Menorrhagia 01/18/2012   Seizure disorder  (HCC) 01/18/2012   Anemia 01/18/2012    Past Surgical History:  Procedure Laterality Date   CESAREAN SECTION     x2. at term   TEE WITHOUT CARDIOVERSION N/A 04/06/2018   Procedure: TRANSESOPHAGEAL ECHOCARDIOGRAM (TEE) BUBBLE STUDY;  Surgeon: Lewayne Bunting, MD;  Location: [facility] ENDOSCOPY;  Service: Cardiovascular;  Laterality: N/A;   TUBAL LIGATION     WISDOM TOOTH EXTRACTION      OB History     Gravida  3   Para  2   Term  2   Preterm      AB  1   Living  2      SAB  1   IAB      Ectopic      Multiple      Live Births  2        Obstetric Comments  Term c/s x 2.           Home Medications    Prior to Admission medications   Medication Sig Start Date End Date Taking? Authorizing Provider  acetaminophen-codeine (TYLENOL #3) 300-30 MG tablet Take 1 tablet by mouth 2 (two) times daily as needed for moderate pain. 01/24/23  Yes Hoy Register, MD  albuterol (VENTOLIN HFA) 108 (90 Base) MCG/ACT inhaler Inhale 1-2 puffs into the lungs every 6 (six) hours as needed for wheezing or shortness of breath. 07/13/23  Yes Diarra Ceja, [place] N, FNP  atorvastatin (LIPITOR) 80 MG tablet Take 1 tablet (80 mg total) by mouth daily. 02/01/23  Yes Carmina Miller, DO  cetirizine (ZYRTEC) 10 MG tablet Take 1 tablet (10 mg total) by mouth daily. 08/16/22  Yes Hoy Register, MD  chlorthalidone (HYGROTON) 25 MG tablet Take 1 tablet (25 mg total) by mouth daily. 02/01/23  Yes Carmina Miller, DO  clopidogrel (PLAVIX) 75 MG tablet Take 1 tablet (75 mg total) by mouth daily. 02/01/23  Yes Carmina Miller, DO  diclofenac Sodium (VOLTAREN) 1 % GEL Apply 2 g topically 4 (four) times daily. 03/30/23  Yes Lovie Macadamia, MD  donepezil (ARICEPT) 10 MG tablet Take 1 tablet (10 mg total) by mouth daily. 02/01/23  Yes Carmina Miller, DO  FLUoxetine (PROZAC) 20 MG capsule Take 1 capsule (20 mg total) by mouth daily. 02/01/23  Yes Carmina Miller, DO  gabapentin (NEURONTIN) 300 MG capsule Take 2 capsules  (600 mg total) by mouth 2 (two) times daily. 07/12/22  Yes Elijah Birk C, DO  hydrOXYzine (ATARAX) 10 MG tablet Take 1 tablet (10 mg total) by mouth 3 (three) times daily as needed. 02/01/23  Yes Carmina Miller, DO  losartan (COZAAR) 50 MG tablet Take 1 tablet (50 mg total) by mouth daily. 03/30/23  Yes Lovie Macadamia, MD  metoprolol tartrate (LOPRESSOR) 50 MG tablet Take 1 tablet (50 mg total) by mouth 2 (two) times daily. 11/16/22  Yes Hoy Register, MD  oxybutynin (DITROPAN XL) 5 MG 24 hr tablet Take 1 tablet (5 mg total) by mouth at bedtime. For overactive bladder 03/17/22  Yes Claiborne Rigg, NP  predniSONE (DELTASONE) 20 MG tablet Take 2 tablets (40 mg total) by mouth daily for 5 days. 07/13/23 07/18/23 Yes Rinaldo Ratel, [place] N, FNP  promethazine-dextromethorphan (PROMETHAZINE-DM) 6.25-15 MG/5ML syrup Take 5 mLs by mouth 4 (four) times daily as needed for cough. 07/13/23  Yes Audianna Landgren, [place] N, FNP    Family History Family History  Problem Relation Age of Onset   Hypertension Mother    Hypertension Sister    Arthritis Sister        knees   Mental illness Daughter        ? bipolar   Hypertension Brother    Deafness Brother    Speech disorder Brother        mute   Mental illness Brother        "he can just fly off"   Scoliosis Son    Breast cancer Neg Hx     Social History Social History   Tobacco Use   Smoking status: Former    Types: Cigars   Smokeless tobacco: Never   Tobacco comments:    "I think I just keep so much on my mind."  Vaping Use   Vaping status: Never Used  Substance Use Topics   Alcohol use: Yes    Alcohol/week: 0.0 - 3.0 standard drinks of alcohol    Comment: social   Drug use: Yes    Types: Marijuana    Comment: occasionally     Allergies   Patient has no known allergies.   Review of Systems Review of Systems  Per HPI   Physical Exam Triage Vital Signs ED Triage Vitals  Encounter Vitals Group     BP 07/13/23 1351 (!) 145/97      Systolic BP Percentile --      Diastolic BP Percentile --      Pulse Rate 07/13/23 1351 (!) 112     Resp 07/13/23 1351 20  Temp 07/13/23 1351 98.1 F (36.7 C)     Temp Source 07/13/23 1351 Oral     SpO2 07/13/23 1351 92 %     Weight 07/13/23 1353 205 lb (93 kg)     Height 07/13/23 1353 5' 6.5" (1.689 m)     Head Circumference --      Peak Flow --      Pain Score 07/13/23 1352 0     Pain Loc --      Pain Education --      Exclude from Growth Chart --    No data found.  Updated Vital Signs BP (!) 145/97 (BP Location: Left Arm)   Pulse (!) 112   Temp 98.1 F (36.7 C) (Oral)   Resp 20   Ht 5' 6.5" (1.689 m)   Wt 205 lb (93 kg)   LMP  (LMP Unknown) Comment: more than a year since last menstrual  SpO2 92%   BMI 32.59 kg/m   Visual Acuity Right Eye Distance:   Left Eye Distance:   Bilateral Distance:    Right Eye Near:   Left Eye Near:    Bilateral Near:     Physical Exam Vitals and nursing note reviewed.  Constitutional:      Appearance: Normal appearance.  HENT:     Head: Normocephalic and atraumatic.     Right Ear: External ear normal.     Left Ear: External ear normal.     Nose: Congestion and rhinorrhea present.     Mouth/Throat:     Mouth: Mucous membranes are moist.     Pharynx: Posterior oropharyngeal erythema present.  Eyes:     Conjunctiva/sclera: Conjunctivae normal.  Cardiovascular:     Rate and Rhythm: Regular rhythm. Tachycardia present.     Heart sounds: Normal heart sounds. No murmur heard. Pulmonary:     Effort: Pulmonary effort is normal.     Breath sounds: Wheezing and rhonchi present.  Musculoskeletal:        General: Normal range of motion.  Skin:    General: Skin is warm and dry.  Neurological:     General: No focal deficit present.     Mental Status: She is alert and oriented to person, place, and time.  Psychiatric:        Mood and Affect: Mood normal.        Behavior: Behavior normal.      UC Treatments / Results   Labs (all labs ordered are listed, but only abnormal results are displayed) Labs Reviewed  POCT INFLUENZA A/B    EKG   Radiology DG Chest 2 View Result Date: 07/13/2023 CLINICAL DATA:  Shortness of breath, cough, and wheezing. EXAM: CHEST - 2 VIEW COMPARISON:  The dated 08/16/2022. FINDINGS: Eventration of the left hemidiaphragm similar to prior radiograph. No focal consolidation, pleural effusion, or pneumothorax. The cardiac silhouette is within normal limits. Atherosclerotic calcification of the aorta. No acute osseous pathology. IMPRESSION: No active cardiopulmonary disease. Electronically Signed   By: Elgie Collard M.D.   On: 07/13/2023 15:53    Procedures Procedures (including critical care time)  Medications Ordered in UC Medications  ipratropium-albuterol (DUONEB) 0.5-2.5 (3) MG/3ML nebulizer solution 3 mL (3 mLs Nebulization Given 07/13/23 1422)  acetaminophen (TYLENOL) tablet 975 mg (975 mg Oral Given 07/13/23 1523)  ipratropium-albuterol (DUONEB) 0.5-2.5 (3) MG/3ML nebulizer solution 3 mL (3 mLs Nebulization Given 07/13/23 1523)  dexamethasone (DECADRON) injection 10 mg (10 mg Intramuscular Given 07/13/23 1522)    Initial Impression /  Assessment and Plan / UC Course  I have reviewed the triage vital signs and the nursing notes.  Pertinent labs & imaging results that were available during my care of the patient were reviewed by me and considered in my medical decision making (see chart for details).  Vitals and triage reviewed, patient is hemodynamically stable.  Oxygenation in the lower 90s initially and tachycardic, suspect due to coughing fits.  Improved after DuoNeb.  Chest x-ray shows chronic elevation of left hemidiaphragm.  Influenza testing negative.  Symptoms started 3 days ago, suspect viral etiology.   Oxygenation after first DuoNeb is 96%.  Diffuse wheezing and rhonchi.  Will give another DuoNeb, steroid injection and Tylenol.  Symptoms improved after second  DuoNeb.  Imaging does not show any acute cardiopulmonary disease. Strict ED precaution given if symptoms worsen.      Final Clinical Impressions(s) / UC Diagnoses   Final diagnoses:  Acute cough  Upper respiratory tract infection, unspecified type     Discharge Instructions      Your flu testing is negative.  We have given you breathing treatments in clinic and you can use the albuterol inhaler every 6 hours for any wheezing or shortness of breath.  Start the steroids tomorrow and take them daily for the next 5 days.  Use the cough medicine as needed.  Do not hesitate to seek further care at the nearest emergency department if you develop worsening shortness of breath or wheezing despite these interventions.     ED Prescriptions     Medication Sig Dispense Auth. Provider   albuterol (VENTOLIN HFA) 108 (90 Base) MCG/ACT inhaler Inhale 1-2 puffs into the lungs every 6 (six) hours as needed for wheezing or shortness of breath. 18 g Lis Savitt, [place] N, [place]   promethazine-dextromethorphan (PROMETHAZINE-DM) 6.25-15 MG/5ML syrup Take 5 mLs by mouth 4 (four) times daily as needed for cough. 118 mL Rinaldo Ratel, [place] N, [place]   predniSONE (DELTASONE) 20 MG tablet Take 2 tablets (40 mg total) by mouth daily for 5 days. 10 tablet Priyah Schmuck, [place] N, FNP      PDMP not reviewed this encounter.   Sarh Kirschenbaum, [place] N, [place] 07/13/23 2026

## 2023-07-14 ENCOUNTER — Other Ambulatory Visit: Payer: Self-pay

## 2023-07-21 ENCOUNTER — Other Ambulatory Visit: Payer: Self-pay

## 2023-07-21 ENCOUNTER — Other Ambulatory Visit: Payer: Self-pay | Admitting: Family Medicine

## 2023-07-21 ENCOUNTER — Other Ambulatory Visit: Payer: Self-pay | Admitting: Student

## 2023-07-21 DIAGNOSIS — E785 Hyperlipidemia, unspecified: Secondary | ICD-10-CM

## 2023-07-21 DIAGNOSIS — R053 Chronic cough: Secondary | ICD-10-CM

## 2023-07-21 MED ORDER — ATORVASTATIN CALCIUM 80 MG PO TABS
80.0000 mg | ORAL_TABLET | Freq: Every day | ORAL | 3 refills | Status: DC
  Filled 2023-07-21: qty 30, 30d supply, fill #0
  Filled 2023-08-26 (×2): qty 30, 30d supply, fill #1
  Filled 2023-09-26 (×4): qty 30, 30d supply, fill #2

## 2023-07-21 NOTE — Telephone Encounter (Signed)
 Medication sent to pharmacy

## 2023-07-25 ENCOUNTER — Other Ambulatory Visit (HOSPITAL_COMMUNITY)
Admission: RE | Admit: 2023-07-25 | Discharge: 2023-07-25 | Disposition: A | Discharge status: Home / Self Care | Payer: Medicaid Other | Source: Ambulatory Visit | Attending: Obstetrics & Gynecology | Admitting: Obstetrics & Gynecology

## 2023-07-25 ENCOUNTER — Encounter: Payer: Self-pay | Admitting: Obstetrics & Gynecology

## 2023-07-25 ENCOUNTER — Ambulatory Visit: Payer: Medicaid Other | Admitting: Obstetrics & Gynecology

## 2023-07-25 VITALS — BP 132/92 | HR 105 | Ht 66.0 in | Wt 211.8 lb

## 2023-07-25 DIAGNOSIS — D219 Benign neoplasm of connective and other soft tissue, unspecified: Secondary | ICD-10-CM | POA: Insufficient documentation

## 2023-07-25 DIAGNOSIS — N858 Other specified noninflammatory disorders of uterus: Secondary | ICD-10-CM | POA: Diagnosis not present

## 2023-07-25 DIAGNOSIS — Z1339 Encounter for screening examination for other mental health and behavioral disorders: Secondary | ICD-10-CM | POA: Diagnosis not present

## 2023-07-25 DIAGNOSIS — N950 Postmenopausal bleeding: Secondary | ICD-10-CM | POA: Insufficient documentation

## 2023-07-25 NOTE — Progress Notes (Signed)
 Pt. Presents for spotting, clear discharge, and abdominal pain. Pt. States that she has to wear pads due to clear discharge. Pt states that the spotting is not everyday but the clear discharge is. Nothing triggers the spotting. It just comes, lasts a day or so.

## 2023-07-25 NOTE — Progress Notes (Signed)
    GYNECOLOGY PROGRESS NOTE  Subjective:    Patient ID: Tina Moss, female    DOB: Oct 22, 1970, 53 y.o.   MRN: [id number]  HPI  Patient is a single 53 y.o. G56P2012 female who presents for [facility] due to PMB for 6 months. She had an ultrasound that showed fibroids but could not see the endometrium. She then had a MRI that showed a 5 mm endometrium.  She has been abstinent for years.  The following portions of the patient's history were reviewed and updated as appropriate: allergies, current medications, past family history, past medical history, past social history, past surgical history, and problem list.  Review of Systems Pertinent items are noted in HPI.  Pap smear and mammogram are UTD.  Objective:   Blood pressure (!) 132/92, pulse (!) 105, height 5\' 6"  (1.676 m), weight 211 lb 12.8 oz (96.1 kg). Body mass index is 34.19 kg/m. General appearance: alert Well nourished, well hydrated Black female, no apparent distress She is ambulating and conversing normally.   Consent signed, time out done Cervix prepped with betadine and sprayed with Hurricaine spray. I then grasped with a single tooth tenaculum. Uterus sounded to 9 cm Pipelle used for 2 passes with a large amount of tissue/blood and fluid obtained. She tolerated the procedure well.     Assessment:   1. PMB (postmenopausal bleeding)   2. Fibroids      Plan:   1. PMB (postmenopausal bleeding) (Primary)  - Surgical pathology( [place]/ [facility])  2. Fibroids  - Surgical pathology( [place]/ [facility])  I will send her a message on Mychart with her results/treatment plan

## 2023-07-26 ENCOUNTER — Encounter: Payer: Self-pay | Admitting: Obstetrics & Gynecology

## 2023-07-26 LAB — SURGICAL PATHOLOGY

## 2023-08-03 ENCOUNTER — Other Ambulatory Visit: Payer: Self-pay

## 2023-08-22 ENCOUNTER — Ambulatory Visit (INDEPENDENT_AMBULATORY_CARE_PROVIDER_SITE_OTHER): Payer: Medicaid Other | Admitting: Clinical

## 2023-08-22 ENCOUNTER — Encounter (HOSPITAL_COMMUNITY): Payer: Self-pay

## 2023-08-22 DIAGNOSIS — F331 Major depressive disorder, recurrent, moderate: Secondary | ICD-10-CM | POA: Diagnosis not present
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
## 2023-08-24 ENCOUNTER — Encounter: Payer: Self-pay | Admitting: Family Medicine

## 2023-08-24 ENCOUNTER — Ambulatory Visit: Attending: Family Medicine | Admitting: Family Medicine

## 2023-08-24 ENCOUNTER — Ambulatory Visit: Payer: Medicaid Other | Admitting: Family Medicine

## 2023-08-24 VITALS — BP 138/87 | HR 96 | Ht 66.0 in | Wt 220.6 lb

## 2023-08-24 DIAGNOSIS — Z8673 Personal history of transient ischemic attack (TIA), and cerebral infarction without residual deficits: Secondary | ICD-10-CM

## 2023-08-24 DIAGNOSIS — M545 Low back pain, unspecified: Secondary | ICD-10-CM | POA: Diagnosis not present

## 2023-08-24 DIAGNOSIS — I110 Hypertensive heart disease with heart failure: Secondary | ICD-10-CM

## 2023-08-24 DIAGNOSIS — I10 Essential (primary) hypertension: Secondary | ICD-10-CM | POA: Diagnosis not present

## 2023-08-24 DIAGNOSIS — R0609 Other forms of dyspnea: Secondary | ICD-10-CM

## 2023-08-24 DIAGNOSIS — I509 Heart failure, unspecified: Secondary | ICD-10-CM | POA: Diagnosis not present

## 2023-08-24 DIAGNOSIS — M170 Bilateral primary osteoarthritis of knee: Secondary | ICD-10-CM

## 2023-08-24 DIAGNOSIS — R9431 Abnormal electrocardiogram [ECG] [EKG]: Secondary | ICD-10-CM

## 2023-08-24 MED ORDER — TIZANIDINE HCL 4 MG PO TABS: 4.0000 mg | ORAL_TABLET | Freq: Three times a day (TID) | ORAL | 1 refills | Status: AC | PRN

## 2023-08-24 MED ORDER — FUROSEMIDE 20 MG PO TABS: 20.0000 mg | ORAL_TABLET | Freq: Every day | ORAL | 3 refills | Status: DC

## 2023-08-24 NOTE — Patient Instructions (Signed)
 VISIT SUMMARY:  During today's visit, we discussed your shortness of breath, elevated blood pressure, knee pain, headaches, and back pain. We also reviewed your current medications and the importance of adhering to your prescribed treatments.  YOUR PLAN:  -CONGESTIVE HEART FAILURE: Congestive heart failure occurs when the heart is unable to pump blood effectively, leading to symptoms like shortness of breath, fatigue, and swelling. We will order a chest x-ray, blood tests, and an echocardiogram to evaluate your heart and lung function. A diuretic will be prescribed to help manage fluid retention, and you will be referred to a cardiologist for urgent evaluation. It is crucial to take your blood pressure medication regularly to prevent worsening of your condition. Please seek emergency care if your symptoms get worse.  -HYPERTENSION: Hypertension, or high blood pressure, can lead to serious cardiovascular problems if not managed properly. It is important to resume and stick to your blood pressure medication as prescribed. We discussed the risks of not taking your medication, including potential heart and blood vessel complications.  -KNEE OSTEOARTHRITIS: Knee osteoarthritis is a condition where the cartilage in the knee joint wears down, causing pain and stiffness. We will refer you to an orthopedic specialist for further evaluation and management, and we may consider additional knee injections to help relieve your pain.  -CHRONIC LOW BACK PAIN: Chronic low back pain can be due to various causes, including muscle strain or nerve issues. We will prescribe tizanidine to help relax your muscles and reduce pain.  -HEADACHES: Your headaches, which are sensitive to light and last for about 10-15 minutes, will be monitored. If they persist or worsen, we may need to conduct further evaluation.  -MEDICATION RECONCILIATION: We noticed some discrepancies between your medication list and what you are currently  taking. It is important to bring all your medication bottles to your next appointment so we can review and reconcile them to ensure you are taking the correct medications.  INSTRUCTIONS:  Please follow up with the cardiologist urgently as referred. Additionally, schedule an appointment for medication reconciliation and bring all your medication bottles to that visit. If your symptoms worsen, seek emergency care immediately.

## 2023-08-24 NOTE — Progress Notes (Signed)
 Subjective:  Patient ID: Tina Moss, female    DOB: [birth year]  Age: 53 y.o. MRN: [id number]  CC: Medical Management of Chronic Issues (SOB/tired all the time/Ankle swelling/Headaches/Knee pain)     Discussed the use of AI scribe software for clinical note transcription with the patient, who gave verbal consent to proceed.  History of Present Illness The patient, with a history of seizures (seizure free for the last 5 years and is not currently on antiseizure medication), multiple sclerosis, L MCA CVA in 03/2017, history of syphilis (diagnosed in 11/2021, status posttreatment with Penicillin G  presents with shortness of breath and elevated blood pressure. She reports feeling as though she has been running after minimal exertion, such as walking to the bathroom. She also notes fatigue and swelling of the ankles. She has not been taking her blood pressure medication for the past three to four days.  She was last seen at the internal medicine clinic and is unable to verify what medications she takes chronically and states a bunch of medications on her list she no longer takes.   She also reports knee pain that has been ongoing for several years and has been previously treated with injections. She has been experiencing headaches that last for about 10-15 minutes and are sensitive to light. She also reports back pain that is worse when standing and radiates across the back.    Past Medical History:  Diagnosis Date   Anemia    CVA (cerebral vascular accident) (HCC)    2020   Epilepsy (HCC)    Fibroids    H/O bacterial infection    H/O mumps    Hyperlipidemia    Hypertension    Seizures (HCC)     Past Surgical History:  Procedure Laterality Date   CESAREAN SECTION     x2. at term   TEE WITHOUT CARDIOVERSION N/A 04/06/2018   Procedure: TRANSESOPHAGEAL ECHOCARDIOGRAM (TEE) BUBBLE STUDY;  Surgeon: Lewayne Bunting, MD;  Location: [facility] ENDOSCOPY;  Service: Cardiovascular;  Laterality:  N/A;   TUBAL LIGATION     WISDOM TOOTH EXTRACTION      Family History  Problem Relation Age of Onset   Hypertension Mother    Hypertension Sister    Arthritis Sister        knees   Mental illness Daughter        ? bipolar   Hypertension Brother    Deafness Brother    Speech disorder Brother        mute   Mental illness Brother        "he can just fly off"   Scoliosis Son    Breast cancer Neg Hx     Social History   Socioeconomic History   Marital status: Divorced    Spouse name: n/a   Number of children: 2   Years of education: 11th grade   Highest education level: 11th grade  Occupational History   Occupation: Housekeeper    Comment: [employer]  Tobacco Use   Smoking status: Former    Types: Cigars   Smokeless tobacco: Never   Tobacco comments:    "I think I just keep so much on my mind."  Vaping Use   Vaping status: Never Used  Substance and Sexual Activity   Alcohol use: Not Currently    Alcohol/week: 0.0 - 3.0 standard drinks of alcohol    Comment: Stopped   Drug use: Not Currently    Types: Marijuana    Comment: Stopped  Sexual activity: Not Currently    Partners: Male    Birth control/protection: None  Other Topics Concern   Not on file  Social History Narrative   Lives with her daughter.   Son is currently incarcerated in [place].   She completed 11th grade, but was kicked out and never when back, as she had become pregnant and was raising her daughter.   Divorced x 2.   Right handed   Drinks caffeine   One story home   Social Drivers of Health   Financial Resource Strain: Not on file  Food Insecurity: Patient Declined (05/06/2023)   Hunger Vital Sign    Worried About Running Out of Food in the Last Year: Patient declined    Ran Out of Food in the Last Year: Patient declined  Transportation Needs: Patient Declined (05/06/2023)   PRAPARE - Administrator, Civil Service (Medical): Patient declined    Lack of Transportation  (Non-Medical): Patient declined  Physical Activity: Not on file  Stress: Not on file  Social Connections: Not on file    No Known Allergies  Outpatient Medications Prior to Visit  Medication Sig Dispense Refill   albuterol (VENTOLIN HFA) 108 (90 Base) MCG/ACT inhaler Inhale 1-2 puffs into the lungs every 6 (six) hours as needed for wheezing or shortness of breath. 18 g 0   atorvastatin (LIPITOR) 80 MG tablet Take 1 tablet (80 mg total) by mouth daily. 30 tablet 3   cetirizine (ZYRTEC) 10 MG tablet Take 1 tablet (10 mg total) by mouth daily. 30 tablet 1   chlorthalidone (HYGROTON) 25 MG tablet Take 1 tablet (25 mg total) by mouth daily. 90 tablet 1   clopidogrel (PLAVIX) 75 MG tablet Take 1 tablet (75 mg total) by mouth daily. 90 tablet 1   diclofenac Sodium (VOLTAREN) 1 % GEL Apply 2 g topically 4 (four) times daily. 100 g 2   donepezil (ARICEPT) 10 MG tablet Take 1 tablet (10 mg total) by mouth daily. 30 tablet 11   FLUoxetine (PROZAC) 20 MG capsule Take 1 capsule (20 mg total) by mouth daily. 90 capsule 1   gabapentin (NEURONTIN) 300 MG capsule Take 2 capsules (600 mg total) by mouth 2 (two) times daily. 60 capsule 5   losartan (COZAAR) 50 MG tablet Take 1 tablet (50 mg total) by mouth daily. 30 tablet 3   metoprolol tartrate (LOPRESSOR) 50 MG tablet Take 1 tablet (50 mg total) by mouth 2 (two) times daily. 180 tablet 1   oxybutynin (DITROPAN XL) 5 MG 24 hr tablet Take 1 tablet (5 mg total) by mouth at bedtime. For overactive bladder 30 tablet 1   promethazine-dextromethorphan (PROMETHAZINE-DM) 6.25-15 MG/5ML syrup Take 5 mLs by mouth 4 (four) times daily as needed for cough. 118 mL 0   acetaminophen-codeine (TYLENOL #3) 300-30 MG tablet Take 1 tablet by mouth 2 (two) times daily as needed for moderate pain. (Patient not taking: Reported on 08/24/2023) 30 tablet 0   hydrOXYzine (ATARAX) 10 MG tablet Take 1 tablet (10 mg total) by mouth 3 (three) times daily as needed. (Patient not taking:  Reported on 08/24/2023) 60 tablet 1   No facility-administered medications prior to visit.     ROS Review of Systems  Constitutional:  Negative for activity change and appetite change.  HENT:  Negative for sinus pressure and sore throat.   Respiratory:  Positive for shortness of breath. Negative for chest tightness and wheezing.   Cardiovascular:  Positive for leg swelling. Negative  for chest pain and palpitations.  Gastrointestinal:  Negative for abdominal distention, abdominal pain and constipation.  Genitourinary: Negative.   Musculoskeletal:        See HPI  Neurological:  Positive for headaches.  Psychiatric/Behavioral:  Negative for behavioral problems and dysphoric mood.     Objective:  BP 138/87   Pulse 96   Ht 5\' 6"  (1.676 m)   Wt 220 lb 9.6 oz (100.1 kg)   LMP  (LMP Unknown) Comment: more than a year since last menstrual  SpO2 97%   BMI 35.61 kg/m      08/24/2023    4:06 PM 08/24/2023    2:57 PM 07/25/2023    9:55 AM  BP/Weight  Systolic BP 138 154 132  Diastolic BP 87 87 92  Wt. (Lbs)  220.6 211.8  BMI  35.61 kg/m2 34.19 kg/m2      Physical Exam Constitutional:      Appearance: She is well-developed.  Neck:     Comments: Slightly elevated JVD Cardiovascular:     Rate and Rhythm: Normal rate.     Heart sounds: Normal heart sounds. No murmur heard. Pulmonary:     Effort: Pulmonary effort is normal.     Breath sounds: No wheezing or rales.     Comments: Decreased breath sounds in left lower lobe Chest:     Chest wall: No tenderness.  Abdominal:     General: Bowel sounds are normal. There is no distension.     Palpations: Abdomen is soft. There is no mass.     Tenderness: There is no abdominal tenderness.  Musculoskeletal:        General: Normal range of motion.     Right lower leg: No edema.     Left lower leg: No edema.  Neurological:     Mental Status: She is alert and oriented to person, place, and time.  Psychiatric:        Mood and Affect:  Mood normal.        Latest Ref Rng & Units 03/30/2023   10:55 AM 11/16/2022    1:38 PM 05/24/2022    2:32 PM  CMP  Glucose 70 - 99 mg/dL 90  409    BUN 6 - 24 mg/dL 10  14    Creatinine 8.11 - 1.00 mg/dL 9.14  7.82    Sodium 956 - 144 mmol/L 141  139    Potassium 3.5 - 5.2 mmol/L 4.1  3.8    Chloride 96 - 106 mmol/L 105  100    CO2 20 - 29 mmol/L 20  26    Calcium 8.7 - 10.2 mg/dL 9.7  9.9    Total Protein 6.0 - 8.5 g/dL   7.5     Lipid Panel     Component Value Date/Time   CHOL 232 (H) 03/30/2023 1055   TRIG 63 03/30/2023 1055   HDL 59 03/30/2023 1055   CHOLHDL 3.9 03/30/2023 1055   CHOLHDL 2.9 04/05/2018 0535   VLDL 13 04/05/2018 0535   LDLCALC 162 (H) 03/30/2023 1055    CBC    Component Value Date/Time   WBC 3.5 03/30/2023 1055   WBC 3.2 (L) 09/10/2021 0923   RBC 4.74 03/30/2023 1055   RBC 4.53 09/10/2021 0923   HGB 14.4 03/30/2023 1055   HCT 43.6 03/30/2023 1055   PLT 230 03/30/2023 1055   MCV 92 03/30/2023 1055   MCH 30.4 03/30/2023 1055   MCH 31.2 04/02/2021 2034   MCHC 33.0  03/30/2023 1055   MCHC 32.7 09/10/2021 0923   RDW 13.0 03/30/2023 1055   LYMPHSABS 1.4 10/16/2021 0843   MONOABS 0.4 04/02/2021 2034   EOSABS 0.0 10/16/2021 0843   BASOSABS 0.0 10/16/2021 4098    Lab Results  Component Value Date   HGBA1C 5.8 (A) 02/01/2023       Assessment & Plan Congestive Heart Failure Presumptive diagnosis due to shortness of breath, fatigue, and peripheral edema due to non-compliance with antihypertensive medication.  EKG indicates possible ischemia which is present on previous EKG with associated ectopic beats.  - Order chest x-ray to evaluate pulmonary status. - Order blood tests to assess cardiac function. - Order echocardiogram to assess cardiac function. - Prescribe diuretic to manage fluid retention. - Refer to cardiology for urgent evaluation. - Educate on the importance of regular antihypertensive medication adherence. - Advise to seek  emergency care if symptoms worsen.  Hypertension Non-compliance with antihypertensive medication may have contributed to symptoms.  -Blood pressure on repeat is normal - Instruct to resume and adhere to antihypertensive regimen. - Educate on risks of non-adherence, including potential cardiovascular complications.  Knee Osteoarthritis Chronic knee pain with confirmed arthritis. Previous injections provided relief. - Refer to orthopedics for further evaluation and management. - Consider additional knee injections for analgesia.  Chronic Low Back Pain Chronic low back pain with bilateral sciatica. Voltaren gel ineffective. - Prescribe tizanidine for muscle relaxation and analgesia.  Headaches Intermittent frontal headaches with light sensitivity. - Monitor headache pattern and consider further evaluation if symptoms persist or worsen. -Use Tylenol as needed  History of stroke -No residual deficits -Adherence with medications has been emphasized -Secondary risk factor modification is imperative -Continue statin   Medication Reconciliation Discrepancies between medication list and intake contribute to non-compliance. - Schedule appointment for medication reconciliation. - Instruct to bring all medication bottles to the next appointment for review.      Meds ordered this encounter  Medications   furosemide (LASIX) 20 MG tablet    Sig: Take 1 tablet (20 mg total) by mouth daily.    Dispense:  30 tablet    Refill:  3   tiZANidine (ZANAFLEX) 4 MG tablet    Sig: Take 1 tablet (4 mg total) by mouth every 8 (eight) hours as needed.    Dispense:  60 tablet    Refill:  1    Follow-up: Return in about 1 week (around 08/31/2023) for Medication reconciliation with Franky Macho, Coordination of care with PCP in 6 weeks.     Visit required 49 minutes of patient care including median intraservice time, reviewing previous notes and test results, coordination of care, counseling the patient  in addition to management of chronic medical conditions.Time also spent ordering medications, investigations and documenting in the chart.  All questions were answered to the patient's satisfaction   Hoy Register, MD, FAAFP. [facility] [place], [place] [phone]   08/24/2023, 5:31 PM

## 2023-08-25 ENCOUNTER — Encounter: Payer: Self-pay | Admitting: Family Medicine

## 2023-08-25 LAB — CMP14+EGFR
ALT: 17 IU/L (ref 0–32)
AST: 13 IU/L (ref 0–40)
Albumin: 4.2 g/dL (ref 3.8–4.9)
Alkaline Phosphatase: 136 IU/L — ABNORMAL HIGH (ref 44–121)
BUN/Creatinine Ratio: 21 (ref 9–23)
BUN: 14 mg/dL (ref 6–24)
Bilirubin Total: 0.3 mg/dL (ref 0.0–1.2)
CO2: 27 mmol/L (ref 20–29)
Calcium: 9.1 mg/dL (ref 8.7–10.2)
Chloride: 104 mmol/L (ref 96–106)
Creatinine, Ser: 0.68 mg/dL (ref 0.57–1.00)
Globulin, Total: 2.7 g/dL (ref 1.5–4.5)
Glucose: 91 mg/dL (ref 70–99)
Potassium: 3.8 mmol/L (ref 3.5–5.2)
Sodium: 141 mmol/L (ref 134–144)
Total Protein: 6.9 g/dL (ref 6.0–8.5)
eGFR: 103 mL/min/{1.73_m2} (ref >59)

## 2023-08-25 LAB — PRO B NATRIURETIC PEPTIDE: NT-Pro BNP: 69 pg/mL (ref 0–249)

## 2023-08-26 ENCOUNTER — Other Ambulatory Visit: Payer: Self-pay

## 2023-08-26 ENCOUNTER — Other Ambulatory Visit: Payer: Self-pay | Admitting: Student

## 2023-08-26 ENCOUNTER — Other Ambulatory Visit: Payer: Self-pay | Admitting: Family Medicine

## 2023-08-26 DIAGNOSIS — I10 Essential (primary) hypertension: Secondary | ICD-10-CM

## 2023-08-26 DIAGNOSIS — F3289 Other specified depressive episodes: Secondary | ICD-10-CM

## 2023-08-26 DIAGNOSIS — R053 Chronic cough: Secondary | ICD-10-CM

## 2023-08-26 MED ORDER — CETIRIZINE HCL 10 MG PO TABS
10.0000 mg | ORAL_TABLET | Freq: Every day | ORAL | 1 refills | Status: DC
  Filled 2023-08-26: qty 30, 30d supply, fill #0
  Filled 2023-09-26 (×4): qty 30, 30d supply, fill #1

## 2023-08-29 ENCOUNTER — Other Ambulatory Visit: Payer: Self-pay

## 2023-08-30 ENCOUNTER — Ambulatory Visit: Admitting: Physician Assistant

## 2023-08-30 ENCOUNTER — Other Ambulatory Visit (INDEPENDENT_AMBULATORY_CARE_PROVIDER_SITE_OTHER): Payer: Self-pay

## 2023-08-30 ENCOUNTER — Other Ambulatory Visit: Payer: Self-pay

## 2023-08-30 ENCOUNTER — Other Ambulatory Visit (HOSPITAL_COMMUNITY): Payer: Self-pay

## 2023-08-30 DIAGNOSIS — M25562 Pain in left knee: Secondary | ICD-10-CM

## 2023-08-30 DIAGNOSIS — M25561 Pain in right knee: Secondary | ICD-10-CM | POA: Diagnosis not present

## 2023-08-30 MED ORDER — MELOXICAM 7.5 MG PO TABS
7.5000 mg | ORAL_TABLET | Freq: Every day | ORAL | 0 refills | Status: DC
  Filled 2023-08-30 (×2): qty 30, 30d supply, fill #0

## 2023-08-30 NOTE — Progress Notes (Signed)
 Office Visit Note   Patient: Tina Moss           Date of Birth: 1971-01-08           MRN: [id number] Visit Date: 08/30/2023              Requested by: Hoy Register, MD [address] [place],  [place] [address] PCP: Hoy Register, MD   Assessment & Plan: Visit Diagnoses:  1. Pain in both knees, unspecified chronicity     Plan: Amita is a very pleasant 53 year old woman who has a long history of bilateral knee pain.  She says she has been having troubles for years.  She gets aching.  She does have a history of MS.  She said she rolls out of bed due to the pain.  She said she cannot stand for long.  She has had steroid injections in the past.  Findings consistent with some early arthritis.  We talked about continuing Voltaren gel trying a small amount of meloxicam though she should not take this with with ibuprofen Advil or Aleve.  I also think she would benefit from physical therapy.  We discussed injection she would like to defer that at this time  Follow-Up Instructions: No follow-ups on file.   Orders:  Orders Placed This Encounter  Procedures   XR KNEE 3 VIEW LEFT   XR KNEE 3 VIEW RIGHT   No orders of the defined types were placed in this encounter.     Procedures: No procedures performed   Clinical Data: No additional findings.   Subjective: Chief Complaint  Patient presents with   Right Knee - Pain   Left Knee - Pain    HPI patient presents today with bilateral knee pain.  No particular injury she has been having problems for years she said aches really bad she said she rolls pain shoots through the legs she said she cannot stand very long she is  Review of Systems  All other systems reviewed and are negative.    Objective: Vital Signs: LMP  (LMP Unknown) Comment: more than a year since last menstrual  Physical Exam Constitutional:      Appearance: Normal appearance.  Pulmonary:     Effort: Pulmonary effort is normal.  Skin:     General: Skin is warm and dry.  Neurological:     General: No focal deficit present.     Mental Status: She is alert and oriented to person, place, and time.  Psychiatric:        Mood and Affect: Mood normal.        Behavior: Behavior normal.     Ortho Exam Bilateral knees no effusion no erythema compartments are soft and compressible she does have some grinding with range of motion no ligamentous instability negative Homans' sign bilaterally Specialty Comments:  No specialty comments available.  Imaging: No results found.   PMFS History: Patient Active Problem List   Diagnosis Date Noted   Viral URI 05/20/2023   Primary osteoarthritis involving multiple joints 03/30/2023   Postmenopausal bleeding 03/30/2023   Anxiety and depression 03/30/2023   Chronic bilateral low back pain with sciatica 07/12/2022   Chronic pain syndrome 07/12/2022   Prediabetes 10/06/2021   Memory impairment 09/10/2021   History of epilepsy 09/10/2021   Vitamin D deficiency 07/17/2020   Anxiety state 07/17/2020   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/2020   Hyperlipidemia 09/18/2019   Acute pain of right knee 09/17/2019   Hip pain, acute, right  09/17/2019   Abnormal uterine bleeding (AUB) 02/13/2019   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   Tobacco use 04/04/2018   Heart palpitations 12/14/2016   Major depressive disorder 11/15/2016   Essential hypertension 11/15/2016   Fibroids 01/18/2012   Menorrhagia 01/18/2012   Seizure disorder (HCC) 01/18/2012   Anemia 01/18/2012   Past Medical History:  Diagnosis Date   Anemia    CVA (cerebral vascular accident) (HCC)    2020   Epilepsy (HCC)    Fibroids    H/O bacterial infection    H/O mumps    Hyperlipidemia    Hypertension    Seizures (HCC)     Family History  Problem Relation Age of Onset   Hypertension Mother    Hypertension Sister    Arthritis Sister        knees   Mental illness Daughter        ? bipolar   Hypertension Brother     Deafness Brother    Speech disorder Brother        mute   Mental illness Brother        "he can just fly off"   Scoliosis Son    Breast cancer Neg Hx     Past Surgical History:  Procedure Laterality Date   CESAREAN SECTION     x2. at term   TEE WITHOUT CARDIOVERSION N/A 04/06/2018   Procedure: TRANSESOPHAGEAL ECHOCARDIOGRAM (TEE) BUBBLE STUDY;  Surgeon: Lewayne Bunting, MD;  Location: [facility] ENDOSCOPY;  Service: Cardiovascular;  Laterality: N/A;   TUBAL LIGATION     WISDOM TOOTH EXTRACTION     Social History   Occupational History   Occupation: Housekeeper    Comment: [employer]  Tobacco Use   Smoking status: Former    Types: Cigars   Smokeless tobacco: Never   Tobacco comments:    "I think I just keep so much on my mind."  Vaping Use   Vaping status: Never Used  Substance and Sexual Activity   Alcohol use: Not Currently    Alcohol/week: 0.0 - 3.0 standard drinks of alcohol    Comment: Stopped   Drug use: Not Currently    Types: Marijuana    Comment: Stopped   Sexual activity: Not Currently    Partners: Male    Birth control/protection: None

## 2023-09-02 ENCOUNTER — Ambulatory Visit (HOSPITAL_COMMUNITY)
Admission: RE | Admit: 2023-09-02 | Discharge: 2023-09-02 | Disposition: A | Discharge status: Home / Self Care | Source: Ambulatory Visit | Attending: Family Medicine | Admitting: Family Medicine

## 2023-09-02 DIAGNOSIS — I10 Essential (primary) hypertension: Secondary | ICD-10-CM | POA: Diagnosis not present

## 2023-09-02 DIAGNOSIS — R0609 Other forms of dyspnea: Secondary | ICD-10-CM | POA: Diagnosis not present

## 2023-09-02 DIAGNOSIS — R0600 Dyspnea, unspecified: Secondary | ICD-10-CM | POA: Diagnosis present

## 2023-09-02 LAB — ECHOCARDIOGRAM COMPLETE: S' Lateral: 2 cm

## 2023-09-02 MED ORDER — PERFLUTREN LIPID MICROSPHERE
1.0000 mL | INTRAVENOUS | Status: AC | PRN
  Administered 2023-09-02: 2 mL via INTRAVENOUS

## 2023-09-05 ENCOUNTER — Encounter: Payer: Self-pay | Admitting: Family Medicine

## 2023-09-09 ENCOUNTER — Ambulatory Visit: Attending: Internal Medicine | Admitting: Internal Medicine

## 2023-09-09 ENCOUNTER — Ambulatory Visit: Attending: Internal Medicine

## 2023-09-09 ENCOUNTER — Telehealth (HOSPITAL_COMMUNITY): Payer: Self-pay | Admitting: Licensed Clinical Social Worker

## 2023-09-09 ENCOUNTER — Other Ambulatory Visit: Payer: Self-pay

## 2023-09-09 VITALS — BP 140/92 | HR 102 | Ht 66.5 in | Wt 217.0 lb

## 2023-09-09 DIAGNOSIS — I10 Essential (primary) hypertension: Secondary | ICD-10-CM

## 2023-09-09 DIAGNOSIS — I493 Ventricular premature depolarization: Secondary | ICD-10-CM | POA: Diagnosis not present

## 2023-09-09 DIAGNOSIS — E782 Mixed hyperlipidemia: Secondary | ICD-10-CM | POA: Diagnosis not present

## 2023-09-09 MED ORDER — CHLORTHALIDONE 25 MG PO TABS
25.0000 mg | ORAL_TABLET | Freq: Every day | ORAL | 1 refills | Status: DC
  Filled 2023-09-09: qty 90, 90d supply, fill #0
  Filled 2023-09-26: qty 90, 90d supply, fill #1

## 2023-09-09 NOTE — Telephone Encounter (Signed)
 CSW consulted to speak with pt regarding resources to help manage medications at home due to compliance/comprehension concerns- unable to reach- left VM requesting return call  Burna Sis, LCSW Clinical Social Worker [facility] Desk#: [phone] Cell#: [phone]

## 2023-09-09 NOTE — Progress Notes (Unsigned)
 Enrolled patient for a 14 day Zio XT monitor to be mailed to patients home   ZIO serial # [id number] mailed to patient and applied in the office.  Patient stated, her heart was on the right side, but found no documentation in her chart, EKG, Echocardiogram, Chest Xray, that verified Dextrocardia.   ZIO monitor applied on sternum and left of sternum with arrow pointing upward.

## 2023-09-09 NOTE — Progress Notes (Signed)
 Cardiology Office Note:  .    Date:  09/09/2023  ID:  [name], DOB [birth year], MRN [id number] PCP: Hoy Register, MD  [facility] Providers Cardiologist:  None     CC: Transition to new cardiologist  History of Present Illness: .    SHARANDA SHINAULT is a 53 y.o. female with hyperlipidemia, diabetes, and hypertension who presents with fatigue and shortness of breath. She is accompanied by her sister (virtual- teleconference in after patient did not know what she takes), who assists with medication management.   She experiences fatigue and shortness of breath with minimal exertion, such as walking to the bathroom. No chest pain is noted, but significant fatigue is present. She also reports palpitations and occasional swelling.  An echocardiogram was recently performed, indicating adequate heart function, although the images were difficult to obtain. She has a history of coronary artery calcifications with a calcium score of 13 and aortic atherosclerosis identified on a coronary CT scan in 2023.  She has been noted to have premature ventricular contractions. Previously, she was managed with metoprolol, which is currently not part of her regimen.  She is on multiple medications, including losartan, cetirizine, atorvastatin, donepezil, meloxicam, furosemide, and tizanidine. She takes these medications daily, requiring assistance from her sister-in-law to manage them. She takes five pills in the morning and additional medications from a brown bag when she returns home.  Her blood pressure and heart rate have been noted to be elevated.   Relevant histories: .  Social- sister is at home ROS: As per HPI.   Studies Reviewed: .   Cardiac Studies & Procedures   ______________________________________________________________________________________________     ECHOCARDIOGRAM  ECHOCARDIOGRAM COMPLETE 09/02/2023  Narrative ECHOCARDIOGRAM REPORT    Patient Name:   MARCEE JACOBS Date of Exam: 09/02/2023 Medical Rec #:  [id number]       Height:       66.0 in Accession #:    [id number]      Weight:       220.6 lb Date of Birth:  07/16/1970        BSA:          2.085 m Patient Age:    53 years        BP:           154/87 mmHg Patient Gender: F               HR:           94 bpm. Exam Location:  Outpatient  Procedure: 2D Echo, Cardiac Doppler and Color Doppler (Both Spectral and Color Flow Doppler were utilized during procedure).  Indications:    Dyspnea  History:        Patient has prior history of Echocardiogram examinations, most recent 04/05/2018. Risk Factors:Hypertension.  Sonographer:    Darlys Gales Referring Phys: [id number] ENOBONG NEWLIN  IMPRESSIONS   1. Left ventricular ejection fraction, by estimation, is 55 to 60%. The left ventricle has normal function. The left ventricle has no regional wall motion abnormalities. Indeterminate diastolic filling due to E-A fusion. 2. Right ventricular systolic function is normal. The right ventricular size is normal. Tricuspid regurgitation signal is inadequate for assessing PA pressure. 3. The mitral valve is normal in structure. No evidence of mitral valve regurgitation. No evidence of mitral stenosis. 4. The aortic valve is tricuspid. Aortic valve regurgitation is not visualized. No aortic stenosis is present. 5. The inferior vena cava is normal in size with greater  than 50% respiratory variability, suggesting right atrial pressure of 3 mmHg.  Comparison(s): No significant change from prior study. Prior images reviewed side by side. Poor quality study, but no major structural abnormalities are identified.  FINDINGS Left Ventricle: Left ventricular ejection fraction, by estimation, is 55 to 60%. The left ventricle has normal function. The left ventricle has no regional wall motion abnormalities. The left ventricular internal cavity size was normal in size. There is borderline concentric left ventricular  hypertrophy. Indeterminate diastolic filling due to E-A fusion.  Right Ventricle: The right ventricular size is normal. No increase in right ventricular wall thickness. Right ventricular systolic function is normal. Tricuspid regurgitation signal is inadequate for assessing PA pressure.  Left Atrium: Left atrial size was normal in size.  Right Atrium: Right atrial size was normal in size.  Pericardium: There is no evidence of pericardial effusion.  Mitral Valve: The mitral valve is normal in structure. Mild mitral annular calcification. No evidence of mitral valve regurgitation. No evidence of mitral valve stenosis.  Tricuspid Valve: The tricuspid valve is normal in structure. Tricuspid valve regurgitation is not demonstrated. No evidence of tricuspid stenosis.  Aortic Valve: The aortic valve is tricuspid. Aortic valve regurgitation is not visualized. No aortic stenosis is present.  Pulmonic Valve: The pulmonic valve was normal in structure. Pulmonic valve regurgitation is not visualized. No evidence of pulmonic stenosis.  Aorta: The aortic root is normal in size and structure.  Venous: The inferior vena cava is normal in size with greater than 50% respiratory variability, suggesting right atrial pressure of 3 mmHg.  IAS/Shunts: No atrial level shunt detected by color flow Doppler.   LEFT VENTRICLE PLAX 2D LVIDd:         3.60 cm LVIDs:         2.00 cm LV PW:         1.10 cm LV IVS:        0.90 cm LVOT diam:     2.00 cm LVOT Area:     3.14 cm    AORTA Ao Root diam: 2.50 cm   SHUNTS Systemic Diam: 2.00 cm  Thurmon Fair MD Electronically signed by Thurmon Fair MD Signature Date/Time: 09/02/2023/8:03:17 PM    Final   TEE  ECHO TEE 04/06/2018  Narrative *[place]* *[facility]* [address] [place], [place] [address] [phone]  ------------------------------------------------------------------- Transesophageal  Echocardiography  Patient:    Arley, Garant MR #:       [id number] Study Date: 04/06/2018 Gender:     F Age:        47 Height:     170.2 cm Weight:     76.2 kg BSA:        1.91 m^2 Pt. Status: Room:       3W36C  PERFORMING   Olga Millers ADMITTING    Bobette Mo ATTENDING    Hanley Ben, Kshitiz Christy Sartorius, Ovidio Kin. REFERRING    Kroeger, Ovidio Kin SONOGRAPHER  Belva Chimes  cc:  ------------------------------------------------------------------- LV EF: 55% -   60%  ------------------------------------------------------------------- Indications:      CVA 436.  ------------------------------------------------------------------- Study Conclusions  - Left ventricle: Hypertrophy was noted. Systolic function was normal. The estimated ejection fraction was in the range of 55% to 60%. Wall motion was normal; there were no regional wall motion abnormalities. - Aortic valve: No evidence of vegetation. - Mitral valve: No evidence of vegetation. - Left atrium: No evidence of thrombus in the atrial  cavity or appendage. - Atrial septum: No defect or patent foramen ovale was identified. - Tricuspid valve: No evidence of vegetation. - Pulmonic valve: No evidence of vegetation.  ------------------------------------------------------------------- Study data:   Study status:  Routine.  Consent:  The risks, benefits, and alternatives to the procedure were explained to the patient and informed consent was obtained.  Procedure:  Initial setup. The patient was brought to the laboratory. Surface ECG leads were monitored. Sedation. Conscious sedation was administered by cardiology staff. Transesophageal echocardiography. Topical anesthesia was obtained using viscous lidocaine. An adult multiplane transesophageal probe was inserted by the attending cardiologistwithout difficulty. Image quality was good.  Study completion:  The patient tolerated the procedure well. There  were no complications.  Administered medications:   Fentanyl, 3mg . Diagnostic transesophageal echocardiography.  2D and color Doppler.  Birthdate:  Patient birthdate: [birth year].  Age:  Patient is 53 yr old.  Sex:  Gender: female.    BMI: 26.3 kg/m^2.  Blood pressure:     151/93  Patient status:  Inpatient.  Study date: Study date: 04/06/2018. Study time: 09:37 AM.  Location: Endoscopy.  -------------------------------------------------------------------  ------------------------------------------------------------------- Left ventricle:  Hypertrophy was noted. Systolic function was normal. The estimated ejection fraction was in the range of 55% to 60%. Wall motion was normal; there were no regional wall motion abnormalities.  ------------------------------------------------------------------- Aortic valve:   Structurally normal valve. Trileaflet. Cusp separation was normal.  No evidence of vegetation.  Doppler:  There was no regurgitation.  ------------------------------------------------------------------- Aorta:  Descending aorta: The descending aorta had mild diffuse disease.  ------------------------------------------------------------------- Mitral valve:   Structurally normal valve.   Leaflet separation was normal.  No evidence of vegetation.  Doppler:  There was trivial regurgitation.  ------------------------------------------------------------------- Left atrium:  The atrium was normal in size.  No evidence of thrombus in the atrial cavity or appendage.  ------------------------------------------------------------------- Atrial septum:  No defect or patent foramen ovale was identified.  ------------------------------------------------------------------- Right ventricle:  The cavity size was normal. Systolic function was normal.  ------------------------------------------------------------------- Pulmonic valve:    Structurally normal valve.   Cusp separation  was normal.  No evidence of vegetation.  Doppler:  There was trivial regurgitation.  ------------------------------------------------------------------- Tricuspid valve:   Structurally normal valve.   Leaflet separation was normal.  No evidence of vegetation.  Doppler:  There was trivial regurgitation.  ------------------------------------------------------------------- Right atrium:  The atrium was normal in size.  ------------------------------------------------------------------- Pericardium:  There was no pericardial effusion.  ------------------------------------------------------------------- Measurements  Aorta                  Value Aortic root ID, ED     28    mm  Legend: (L)  and  (H)  mark values outside specified reference range.  ------------------------------------------------------------------- Prepared and Electronically Authenticated by  Olga Millers 2019-10-24T15:55:13    CT SCANS  CT CORONARY MORPH W/CTA COR W/SCORE 02/16/2022  Addendum 02/17/2022  2:00 PM ADDENDUM REPORT: 02/17/2022 13:58  EXAM: OVER-READ INTERPRETATION  CT CHEST  The following report is an over-read performed by radiologist Dr. Neita Garnet of [facility] on 02/17/2022. This over-read does not include interpretation of cardiac or coronary anatomy or pathology. The coronary calcium score/coronary CTA interpretation by the cardiologist is attached.  COMPARISON:  AP chest 12/10/2019, chest two views 04/04/2018  FINDINGS: Cardiovascular: There are no significant extracardiac vascular findings.  Mediastinum/Nodes: There are no enlarged lymph nodes within the visualized mediastinum.  Lungs/Pleura: There is again moderate to high-grade elevation of the left hemidiaphragm. There is curvilinear  left basilar subsegmental atelectasis associated with a chronic left hemidiaphragm elevation.  Upper abdomen: Mild diverticulosis within the visualized distal transverse colon  and proximal descending colon.  Musculoskeletal/Chest wall: Mild-to-moderate multilevel degenerative disc and anterior endplate spurring of the visualized mid to lower thoracic spine.  IMPRESSION: No significant extracardiac findings within the visualized chest.   Electronically Signed By: Neita Garnet M.D. On: 02/17/2022 13:58  Narrative HISTORY: Chest pain, nonspecific Chest Pain  EXAM: Cardiac/Coronary CT  TECHNIQUE: The patient was scanned on a [employer].  PROTOCOL: A 120 kV prospective scan was triggered in the descending thoracic aorta at 111 HU's. Axial non-contrast 3 mm slices were carried out through the heart. The data set was analyzed on a dedicated work station and scored using the Agatston method. Gantry rotation speed was 250 msecs and collimation was .6 mm. Heart rate was optimized medically and sl NTG was given. The 3D data set was reconstructed in 5% intervals of the 35-75 % of the R-R cycle. Systolic and diastolic phases were analyzed on a dedicated work station using MPR, MIP and VRT modes. The patient received OMNIPAQUE IOHEXOL 350 MG/ML SOLN of contrast.  FINDINGS: Coronary calcium score: The patient's coronary artery calcium score is 13, which places the patient in the 89th percentile.  Coronary arteries: Normal coronary origins.  Right dominance.  Right Coronary Artery: Normal caliber vessel, gives rise to PDA. Mixed calcified and noncalcified plaque in proximal vessel with 1-24% stenosis.  Left Main Coronary Artery: Normal caliber vessel. Distal left main with mixed calcified and noncalcified plaque with 1-24% stenosis.  Left Anterior Descending Coronary Artery: Normal caliber vessel. Mixed calcified and noncalcified plaque in proximal vessel with 1-24% stenosis. Gives rise to three diagonal branches. Distal LAD wraps apex.  Left Circumflex Artery: Normal caliber vessel. No significant plaque or stenosis. Gives rise to  one small OM branches.  Aorta: Normal size, 30 mm at the mid ascending aorta (level of the PA bifurcation) measured double oblique. Aortic atherosclerosis. No dissection seen in visualized portions of the aorta.  Aortic Valve: No calcifications. Trileaflet.  Other findings:  Normal pulmonary vein drainage into the left atrium.  Normal left atrial appendage without a thrombus.  Normal size of the pulmonary artery.  Normal appearance of the pericardium.  Mild mitral annular calcification.  Significantly elevated left hemidiaphragm.  IMPRESSION: 1. Minimal nonobstructive CAD, CADRADS = 1.  2. Coronary calcium score of 13. This was 89th percentile for age and sex matched control.  3. Normal coronary origin with right dominance.  4. Aortic atherosclerosis.  5. Significantly elevated left hemidiaphragm.  INTERPRETATION:  CAD-RADS 1: Minimal non-obstructive CAD (1-24%). Consider non-atherosclerotic causes of chest pain. Consider preventive therapy and risk factor modification.  Electronically Signed: By: Jodelle Red M.D. On: 02/17/2022 13:27     ______________________________________________________________________________________________       Physical Exam:    VS:  BP (!) 140/92 (BP Location: Right Arm)   Pulse (!) 102   Ht 5' 6.5" (1.689 m)   Wt 98.4 kg   LMP  (LMP Unknown) Comment: more than a year since last menstrual  SpO2 96%   BMI 34.50 kg/m    Wt Readings from Last 3 Encounters:  09/09/23 98.4 kg  08/24/23 100.1 kg  07/25/23 96.1 kg    Gen: no distress  Neck: No JVD Cardiac: No Rubs or Gallops, no Murmur, regular tachycardia, +2 radial pulses Respiratory: Clear to auscultation bilaterally, normal effort, normal  respiratory rate GI: Soft, nontender,  non-distended  MS: Non pitting edema;  moves all extremities Integument: Skin feels warm Neuro:  At time of evaluation, alert and oriented to person/place/time not alert to  situation Psych: Normal affect, patient feels fair   ASSESSMENT AND PLAN: .    Hypertension  From her White Bag at home Donepezil 10 mg Lorsartan 50 mg Cetirizine 10 mg Atorvastatin 80 mg  From her Brown Bag at home Meloxicam 7.5 mg Furosemide 20 mg every day Tizanidine 4 mg 8 hrs as needed   Fatigue and Dyspnea Cardiac causes are less likely given normal heart function on echocardiogram, despite challenging image acquisition.  Elevated blood pressure and heart rate contribute to symptoms. - Add chlorthalidone to manage blood pressure and assess response (return 25 mg PO Daily) - Order heart monitor to evaluate for palpitations and premature ventricular contractions (PVCs on last EKG 08/24/23 from [facility] facility) - Reassess symptoms after blood pressure and heart rate are controlled  Hypertension Blood pressure is elevated at 140/92 mmHg. Dyan is on multiple medications, but adherence and effectiveness are unclear due to confusion with medication management. Chlorthalidone was previously effective in controlling blood pressure. Elevated blood pressure and heart rate may contribute to fatigue and dyspnea. - Add chlorthalidone to the medication regimen - Check blood pressure regularly - Review medication adherence and simplify regimen with pharmacist assistance (Pharm D clinic f/u, BMP f/u)  Palpitations and Premature Ventricular Contractions (PVCs) Rainie experiences palpitations and has PVCs. Previously on metoprolol, which helped control symptoms. Current symptoms warrant further evaluation. A heart monitor will assess current heart rhythm and PVC frequency. - Order heart monitor to assess current heart rhythm and frequency of PVCs - Consider reintroducing metoprolol based on heart monitor results  Hyperlipidemia Leitha is on atorvastatin 80 mg with an LDL goal of 55 mg/dL. Cholesterol levels need reassessment after addressing more immediate concerns. - Reassess cholesterol  levels after blood pressure and heart rate are controlled - Continue atorvastatin 80 mg - Set LDL goal at 55 mg/dL  Cognitive Impairment Bennetta has mild cognitive impairment, possibly contributing to medication management difficulties. Currently on donepezil, indicating dementia or cognitive decline. Cognitive issues complicate medication adherence. - reaching out for social work support, rest as per Dr. Alvis Lemmings  Medication Management Marcelline has difficulty managing multiple medications, leading to potential non-adherence and suboptimal control of medical conditions. Confusion about medication names and dosages was evident during the visit. A structured medication regimen is necessary to improve adherence.   F/u with Tereso Newcomer or Ronie Spies PA-C in 3-4 months  Time Spent Directly with Patient:   I have spent a total of 43 minutes with the patient reviewing notes, EKG and examining the patient as well as establishing an assessment and plan that was discussed personally with the patient. Discussed disease state education, teleconferenced her sister in to see what medications she is on.  Reached out to my team to see what resources we have to support her care  Riley Lam, MD FASE [facility] Cardiologist [facility]  [address] [place], [address] [place], [place] [address] [phone]  11:13 AM

## 2023-09-09 NOTE — Patient Instructions (Signed)
 Medication Instructions:  RESTART: chlorthalidone 25 mg by mouth once daily  *If you need a refill on your cardiac medications before your next appointment, please call your pharmacy*  Lab Work: IN 2 WEEKS at [facility]- - - BMP  If you have labs (blood work) drawn today and your tests are completely normal, you will receive your results only by: MyChart Message (if you have MyChart) OR A paper copy in the mail If you have any lab test that is abnormal or we need to change your treatment, we will call you to review the results.  Testing/Procedures: Your physician has referred you to the Pharmacy Clinic for Hypertension/ BP control.  Your physician has referred you to Social Work.    Follow-Up: At [facility], you and your health needs are our priority.  As part of our continuing mission to provide you with exceptional heart care, our providers are all part of one team.  This team includes your primary Cardiologist (physician) and Advanced Practice Providers or APPs (Physician Assistants and Nurse Practitioners) who all work together to provide you with the care you need, when you need it.  Your next appointment:   3-4 month(s)  Provider:   Ronie Spies, PA-C or Tereso Newcomer, PA-C        Other Instructions Tina Moss- Long Term Monitor Instructions  Your physician has requested you wear a ZIO patch monitor for 14 days.  This is a single patch monitor. [employer] supplies one patch monitor per enrollment. Additional stickers are not available. Please do not apply patch if you will be having a Nuclear Stress Test,   Cardiac CT, MRI, or Chest Xray during the period you would be wearing the  monitor. The patch cannot be worn during these tests. You cannot remove and re-apply the  ZIO XT patch monitor.  Your ZIO patch monitor will be mailed 3 day USPS to your address on file. It may take 3-5 days  to receive your monitor after you have been enrolled.  Once you have received your  monitor, please review the enclosed instructions. Your monitor  has already been registered assigning a specific monitor serial # to you.  Billing and Patient Assistance Program Information  We have supplied [employer] with any of your insurance information on file for billing purposes. [employer] offers a sliding scale Patient Assistance Program for patients that do not have  insurance, or whose insurance does not completely cover the cost of the ZIO monitor.  You must apply for the Patient Assistance Program to qualify for this discounted rate.  To apply, please call [employer] at [phone], select option 4, select option 2, ask to apply for  Patient Assistance Program. Tina Moss will ask your household income, and how many people  are in your household. They will quote your out-of-pocket cost based on that information.  [employer] will also be able to set up a 65-month, interest-free payment plan if needed.  Applying the monitor   Shave hair from upper left chest.  Hold abrader disc by orange tab. Rub abrader in 40 strokes over the upper left chest as  indicated in your monitor instructions.  Clean area with 4 enclosed alcohol pads. Let dry.  Apply patch as indicated in monitor instructions. Patch will be placed under collarbone on left  side of chest with arrow pointing upward.  Rub patch adhesive wings for 2 minutes. Remove white label marked "1". Remove the white  label marked "2". Rub patch adhesive wings for 2  additional minutes.  While looking in a mirror, press and release button in center of patch. A small green light will  flash 3-4 times. This will be your only indicator that the monitor has been turned on.  Do not shower for the first 24 hours. You may shower after the first 24 hours.  Press the button if you feel a symptom. You will hear a small click. Record Date, Time and  Symptom in the Patient Logbook.  When you are ready to remove the patch, follow instructions on the last 2  pages of Patient  Logbook. Stick patch monitor onto the last page of Patient Logbook.  Place Patient Logbook in the blue and white box. Use locking tab on box and tape box closed  securely. The blue and white box has prepaid postage on it. Please place it in the mailbox as  soon as possible. Your physician should have your test results approximately 7 days after the  monitor has been mailed back to [facility].  Call [facility] Customer Care at [phone] if you have questions regarding  your ZIO XT patch monitor. Call them immediately if you see an orange light blinking on your  monitor.  If your monitor falls off in less than 4 days, contact our Monitor department at [phone].  If your monitor becomes loose or falls off after 4 days call [employer] at [phone] for  suggestions on securing your monitor       1st Floor: - Lobby - Registration  - Pharmacy  - Lab - Cafe  2nd Floor: - PV Lab - Diagnostic Testing (echo, CT, nuclear med)  3rd Floor: - Vacant  4th Floor: - TCTS (cardiothoracic surgery) - AFib Clinic - Structural Heart Clinic - Vascular Surgery  - Vascular Ultrasound  5th Floor: - HeartCare Cardiology (general and EP) - Clinical Pharmacy for coumadin, hypertension, lipid, weight-loss medications, and med management appointments    Valet parking services will be available as well.

## 2023-09-12 ENCOUNTER — Other Ambulatory Visit: Payer: Self-pay

## 2023-09-13 ENCOUNTER — Telehealth (HOSPITAL_COMMUNITY): Payer: Self-pay | Admitting: Licensed Clinical Social Worker

## 2023-09-13 DIAGNOSIS — I10 Essential (primary) hypertension: Secondary | ICD-10-CM

## 2023-09-13 NOTE — Telephone Encounter (Signed)
 H&V Care Navigation CSW Progress Note  Clinical Social Worker received return call from pt.  Pt is agreeable to VBCI referral to pharmacy to help with further medication management/compliance concerns- order placed.  Denies needs with anything else at this time.  Patient is participating in a Managed Medicaid Plan:  Yes  SDOH Screenings   Food Insecurity: Patient Declined (05/06/2023)  Housing: Patient Declined (05/06/2023)  Transportation Needs: Patient Declined (05/06/2023)  Utilities: Patient Declined (05/06/2023)  Depression (PHQ2-9): High Risk (08/24/2023)  Tobacco Use: Medium Risk (08/24/2023)    Burna Sis, LCSW Clinical Social Worker [facility] Desk#: [phone] Cell#: [phone]

## 2023-09-13 NOTE — Telephone Encounter (Signed)
 CSW attempted to contact patient regarding medication compliance/comphrension concerns- unable to reach- left VM requesting return call  Burna Sis, LCSW Clinical Social Worker [facility] Desk#: [phone] Cell#: [phone]

## 2023-09-19 ENCOUNTER — Other Ambulatory Visit: Payer: Self-pay

## 2023-09-23 ENCOUNTER — Other Ambulatory Visit: Payer: Self-pay | Admitting: Physician Assistant

## 2023-09-23 ENCOUNTER — Other Ambulatory Visit: Payer: Self-pay

## 2023-09-23 MED ORDER — MELOXICAM 7.5 MG PO TABS
7.5000 mg | ORAL_TABLET | Freq: Every day | ORAL | 0 refills | Status: DC
Start: 2023-09-23 — End: 2023-10-06
  Filled 2023-09-23 – 2023-09-26 (×6): qty 30, 30d supply, fill #0

## 2023-09-26 ENCOUNTER — Other Ambulatory Visit: Payer: Self-pay

## 2023-09-26 ENCOUNTER — Other Ambulatory Visit: Payer: Self-pay | Admitting: Family Medicine

## 2023-09-26 ENCOUNTER — Other Ambulatory Visit: Payer: Self-pay | Admitting: Student

## 2023-09-26 DIAGNOSIS — F3289 Other specified depressive episodes: Secondary | ICD-10-CM

## 2023-09-26 DIAGNOSIS — I10 Essential (primary) hypertension: Secondary | ICD-10-CM

## 2023-09-26 NOTE — Telephone Encounter (Signed)
 NO LONGER [facility] PATIENT

## 2023-09-27 ENCOUNTER — Other Ambulatory Visit: Payer: Self-pay

## 2023-09-27 ENCOUNTER — Other Ambulatory Visit (HOSPITAL_COMMUNITY): Payer: Self-pay

## 2023-09-27 ENCOUNTER — Ambulatory Visit: Attending: Cardiology | Admitting: Pharmacist

## 2023-09-27 ENCOUNTER — Other Ambulatory Visit: Payer: Self-pay | Admitting: Pharmacist

## 2023-09-27 ENCOUNTER — Ambulatory Visit: Admitting: Pharmacist

## 2023-09-27 VITALS — BP 134/90 | HR 91

## 2023-09-27 DIAGNOSIS — I10 Essential (primary) hypertension: Secondary | ICD-10-CM | POA: Diagnosis not present

## 2023-09-27 DIAGNOSIS — Z8673 Personal history of transient ischemic attack (TIA), and cerebral infarction without residual deficits: Secondary | ICD-10-CM

## 2023-09-27 DIAGNOSIS — R053 Chronic cough: Secondary | ICD-10-CM

## 2023-09-27 DIAGNOSIS — R413 Other amnesia: Secondary | ICD-10-CM

## 2023-09-27 DIAGNOSIS — F3289 Other specified depressive episodes: Secondary | ICD-10-CM

## 2023-09-27 DIAGNOSIS — E785 Hyperlipidemia, unspecified: Secondary | ICD-10-CM

## 2023-09-27 MED ORDER — ATORVASTATIN CALCIUM 80 MG PO TABS
80.0000 mg | ORAL_TABLET | Freq: Every day | ORAL | 1 refills | Status: DC
  Filled 2023-09-27: qty 90, 90d supply, fill #0
  Filled 2023-09-27: qty 30, 30d supply, fill #0
  Filled 2023-10-06 – 2023-10-25 (×2): qty 30, 30d supply, fill #1
  Filled 2023-11-14 – 2023-12-06 (×5): qty 30, 30d supply, fill #2
  Filled 2023-12-28 – 2024-01-05 (×2): qty 30, 30d supply, fill #3
  Filled 2024-01-31: qty 30, 30d supply, fill #0
  Filled 2024-03-01: qty 30, 30d supply, fill #1
  Filled ????-??-??: fill #2
  Filled ????-??-??: fill #4

## 2023-09-27 MED ORDER — DONEPEZIL HCL 10 MG PO TABS
10.0000 mg | ORAL_TABLET | Freq: Every day | ORAL | 11 refills | Status: AC
  Filled 2023-09-27 – 2023-12-06 (×6): qty 30, 30d supply, fill #0
  Filled 2023-12-28 – 2024-01-05 (×2): qty 30, 30d supply, fill #1
  Filled 2024-01-31: qty 30, 30d supply, fill #0
  Filled 2024-03-01: qty 30, 30d supply, fill #1
  Filled 2024-03-29 – 2024-04-03 (×2): qty 30, 30d supply, fill #2
  Filled 2024-04-30 – 2024-05-01 (×2): qty 30, 30d supply, fill #3
  Filled 2024-05-28: qty 30, 30d supply, fill #4
  Filled 2024-07-02 – 2024-07-03 (×3): qty 30, 30d supply, fill #5
  Filled ????-??-??: fill #2

## 2023-09-27 MED ORDER — CLOPIDOGREL BISULFATE 75 MG PO TABS
75.0000 mg | ORAL_TABLET | Freq: Every day | ORAL | 1 refills | Status: DC
  Filled 2023-09-27: qty 30, 30d supply, fill #0
  Filled 2023-09-27: qty 90, 90d supply, fill #0
  Filled 2023-10-06 – 2023-10-25 (×2): qty 30, 30d supply, fill #1
  Filled 2023-11-14 – 2023-12-06 (×5): qty 30, 30d supply, fill #2
  Filled 2023-12-28 – 2024-01-05 (×2): qty 30, 30d supply, fill #3
  Filled 2024-01-31: qty 30, 30d supply, fill #0
  Filled 2024-03-01: qty 30, 30d supply, fill #1
  Filled ????-??-??: fill #4

## 2023-09-27 MED ORDER — FUROSEMIDE 20 MG PO TABS
20.0000 mg | ORAL_TABLET | Freq: Every day | ORAL | 1 refills | Status: DC
  Filled 2023-09-27: qty 90, 90d supply, fill #0
  Filled 2023-09-27: qty 30, 30d supply, fill #0
  Filled 2023-10-26 – 2023-11-02 (×2): qty 90, 90d supply, fill #0
  Filled 2023-11-14 (×2): qty 90, 90d supply, fill #1
  Filled 2023-11-24: qty 30, 30d supply, fill #1
  Filled 2023-11-24: qty 90, 90d supply, fill #1
  Filled 2024-01-31: qty 30, 30d supply, fill #0
  Filled 2024-03-01: qty 30, 30d supply, fill #1
  Filled 2024-03-29 – 2024-04-03 (×2): qty 30, 30d supply, fill #2
  Filled ????-??-??: fill #1

## 2023-09-27 MED ORDER — OXYBUTYNIN CHLORIDE ER 5 MG PO TB24
5.0000 mg | ORAL_TABLET | Freq: Every day | ORAL | 1 refills | Status: DC
  Filled 2023-09-27: qty 30, 30d supply, fill #0
  Filled 2023-09-27: qty 90, 90d supply, fill #0
  Filled 2023-10-06 – 2023-10-25 (×2): qty 30, 30d supply, fill #1
  Filled 2023-11-14 – 2023-12-06 (×5): qty 30, 30d supply, fill #2
  Filled 2023-12-28 – 2024-01-05 (×2): qty 30, 30d supply, fill #3
  Filled 2024-01-31: qty 30, 30d supply, fill #0
  Filled 2024-03-01: qty 30, 30d supply, fill #1
  Filled ????-??-??: fill #4

## 2023-09-27 MED ORDER — FLUOXETINE HCL 20 MG PO CAPS
20.0000 mg | ORAL_CAPSULE | Freq: Every day | ORAL | 1 refills | Status: DC
  Filled 2023-09-27 (×2): qty 30, 30d supply, fill #0
  Filled 2023-10-06 – 2023-10-25 (×2): qty 30, 30d supply, fill #1
  Filled 2023-11-14 – 2023-11-24 (×3): qty 30, 30d supply, fill #2

## 2023-09-27 MED ORDER — CHLORTHALIDONE 25 MG PO TABS
25.0000 mg | ORAL_TABLET | Freq: Every day | ORAL | 1 refills | Status: DC
  Filled 2023-09-27: qty 90, 90d supply, fill #0
  Filled 2023-09-27: qty 30, 30d supply, fill #0
  Filled 2023-11-14 (×2): qty 90, 90d supply, fill #0
  Filled 2023-11-24: qty 30, 30d supply, fill #0
  Filled 2023-11-28: qty 90, 90d supply, fill #0
  Filled 2023-12-06: qty 30, 30d supply, fill #0
  Filled 2023-12-28 – 2024-01-05 (×2): qty 30, 30d supply, fill #1
  Filled 2024-01-31: qty 30, 30d supply, fill #0
  Filled 2024-03-01: qty 30, 30d supply, fill #1
  Filled 2024-03-29 – 2024-04-03 (×2): qty 30, 30d supply, fill #2
  Filled 2024-04-30: qty 30, 30d supply, fill #3
  Filled ????-??-??: fill #2

## 2023-09-27 MED ORDER — LOSARTAN POTASSIUM 50 MG PO TABS
50.0000 mg | ORAL_TABLET | Freq: Every day | ORAL | 1 refills | Status: DC
  Filled 2023-09-27: qty 30, 30d supply, fill #0
  Filled 2023-09-27: qty 90, 90d supply, fill #0
  Filled 2023-10-06 – 2023-10-25 (×2): qty 30, 30d supply, fill #1
  Filled 2023-11-14 – 2023-12-06 (×5): qty 30, 30d supply, fill #2
  Filled 2023-12-28 – 2024-01-05 (×2): qty 30, 30d supply, fill #3
  Filled 2024-01-31: qty 30, 30d supply, fill #0
  Filled 2024-03-01: qty 30, 30d supply, fill #1
  Filled ????-??-??: fill #4

## 2023-09-27 MED ORDER — CETIRIZINE HCL 10 MG PO TABS
10.0000 mg | ORAL_TABLET | Freq: Every day | ORAL | 1 refills | Status: DC
  Filled 2023-09-27: qty 30, 30d supply, fill #0
  Filled 2023-09-27: qty 90, 90d supply, fill #0
  Filled 2023-10-06 – 2023-10-25 (×2): qty 30, 30d supply, fill #1
  Filled 2023-11-14 – 2023-12-06 (×5): qty 30, 30d supply, fill #2
  Filled 2023-12-28 – 2024-01-05 (×2): qty 30, 30d supply, fill #3
  Filled 2024-01-31: qty 30, 30d supply, fill #0
  Filled 2024-03-01: qty 30, 30d supply, fill #1
  Filled ????-??-??: fill #4

## 2023-09-27 MED ORDER — METOPROLOL TARTRATE 50 MG PO TABS
50.0000 mg | ORAL_TABLET | Freq: Two times a day (BID) | ORAL | 1 refills | Status: DC
  Filled 2023-09-27: qty 180, 90d supply, fill #0
  Filled 2023-09-27: qty 60, 30d supply, fill #0
  Filled 2023-10-06: qty 180, 90d supply, fill #0
  Filled 2023-10-25: qty 60, 30d supply, fill #0
  Filled 2023-11-14 – 2023-12-06 (×5): qty 60, 30d supply, fill #1
  Filled 2023-12-28 – 2024-01-05 (×2): qty 60, 30d supply, fill #2

## 2023-09-27 NOTE — Progress Notes (Signed)
 Patient ID: Tina Moss                 DOB: 1970-10-19                      MRN: [id number]      HPI: Tina Moss is a 53 y.o. female referred by Dr. Izora Ribas to HTN clinic. PMH is significant for HTN, memory impairment, LM cerebral artery stroke. She was seen by Dr. Izora Ribas 09/09/23.  Medication compliance was questioned and blood pressure was elevated 140/92.    Patient presents today to Pharm.D. clinic.  She brings in all of her medications. From her medication list medications that were missing was clopidogrel, fluoxetine, metoprolol and oxybutynin.  She thinks she may have some metoprolol at home but does not sound like she has been taking it.  She admits that sometimes she forgets to take her medications.  If she is rushing around in the morning or if she has to go somewhere sometimes she does not take it because she says that they make her sleepy.  She also says that sometimes she forgets if she took it so she does not take it until the next day.  She has not taken her blood pressure medicines this morning.  She does have a blood pressure cuff at home but is not checking blood pressure often.  She is not really sure what her medications are for.  I have provided her this both written on bottles and on her medication list.  She was supposed to have an appointment with Franky Macho the pharmacist over at her primary care office today to discuss pill packing and do a medication reconciliation.  I spoke with Franky Macho, since I did the med rec already he states there is no need to see the patient.  I discussed using [facility] to do a pill pack for her which she was agreeable to.  Franky Macho to organize this with [employer].  We talked about strategies to help her remember to take her medications.  She does not feel comfortable leaving her medications by the sink since she lives with her sister and her kids in the house.  We talked about taking most of her medications in the evening since she says  they make her sleepy, with the exception of her furosemide.  We talked about trying to associate taking her medications with her routine such as bedtime routine.  Also encouraged her to set an alarm on her phone.  Current HTN meds: chlorthalidone 25mg  daily, losartan 50mg  daily, furosemide 20mg  daily Previously tried:  BP goal: <130/80  Family History:  Family History  Problem Relation Age of Onset   Hypertension Mother    Hypertension Sister    Arthritis Sister        knees   Mental illness Daughter        ? bipolar   Hypertension Brother    Deafness Brother    Speech disorder Brother        mute   Mental illness Brother        "he can just fly off"   Scoliosis Son    Breast cancer Neg Hx      Social History: no tobacco, no ETOH  Diet: some tea, no coffee  Exercise:  Going to start PT soon  Home BP readings: None available   Wt Readings from Last 3 Encounters:  09/09/23 217 lb (98.4 kg)  08/24/23 220 lb 9.6 oz (  100.1 kg)  07/25/23 211 lb 12.8 oz (96.1 kg)   BP Readings from Last 3 Encounters:  09/27/23 (!) 134/90  09/09/23 (!) 140/92  08/24/23 138/87   Pulse Readings from Last 3 Encounters:  09/27/23 91  09/09/23 (!) 102  08/24/23 96    Renal function: CrCl cannot be calculated (Patient's most recent lab result is older than the maximum 21 days allowed.).  Past Medical History:  Diagnosis Date   Anemia    CVA (cerebral vascular accident) (HCC)    2020   Epilepsy (HCC)    Fibroids    H/O bacterial infection    H/O mumps    Hyperlipidemia    Hypertension    Seizures (HCC)     Current Outpatient Medications on File Prior to Visit  Medication Sig Dispense Refill   albuterol (VENTOLIN HFA) 108 (90 Base) MCG/ACT inhaler Inhale 1-2 puffs into the lungs every 6 (six) hours as needed for wheezing or shortness of breath. 18 g 0   meloxicam (MOBIC) 7.5 MG tablet Take 1 tablet (7.5 mg total) by mouth daily. 30 tablet 0   tiZANidine (ZANAFLEX) 4 MG tablet  Take 1 tablet (4 mg total) by mouth every 8 (eight) hours as needed. 60 tablet 1   diclofenac Sodium (VOLTAREN) 1 % GEL Apply 2 g topically 4 (four) times daily. (Patient not taking: Reported on 09/27/2023) 100 g 2   No current facility-administered medications on file prior to visit.    No Known Allergies  Blood pressure (!) 134/90, pulse 91.   Assessment/Plan: HYPERTENSION CONTROL Vitals:   09/27/23 1156 09/27/23 1158  BP: (!) 140/90 (!) 134/90    The patient's blood pressure is elevated above target today.  In order to address the patient's elevated BP: Blood pressure will be monitored at home to determine if medication changes need to be made.      1. Hypertension -  Essential hypertension Assessment: Blood pressure elevated in clinic today, patient has not taken her blood pressure medicines yet today.  Generally takes in the morning and is now close to the afternoon Medication compliance is a challenge for patient-we have discussed ways to improve this She is agreeable to using pill pack from [facility] We discussed taking her medications in the evening with the exception of her fluid pill since they make her sleepy and since sometimes she skips them in the morning because they make her sleepy She does have a blood pressure cuff at home but is not checking  Plan: No medication changes today since I only have 1 blood pressure reading and compliance seems to be the biggest issue here Pillpack from [facility] chlorthalidone 25 mg daily and losartan 50 mg daily Start checking blood pressure at home daily Bring blood pressure log and machine to next visit BMP today since adding chlorthalidone Follow-up in about 2 weeks in clinic    Thank you  Bryer Cozzolino D Jessamyn Watterson, Pharm.D, BCACP, CPP [facility] of [facility] [address], [place], [place] [address]  Phone: [phone]; Fax: [phone]

## 2023-09-27 NOTE — Assessment & Plan Note (Signed)
 Assessment: Blood pressure elevated in clinic today, patient has not taken her blood pressure medicines yet today.  Generally takes in the morning and is now close to the afternoon Medication compliance is a challenge for patient-we have discussed ways to improve this She is agreeable to using pill pack from [facility] We discussed taking her medications in the evening with the exception of her fluid pill since they make her sleepy and since sometimes she skips them in the morning because they make her sleepy She does have a blood pressure cuff at home but is not checking  Plan: No medication changes today since I only have 1 blood pressure reading and compliance seems to be the biggest issue here Pillpack from [facility] chlorthalidone 25 mg daily and losartan 50 mg daily Start checking blood pressure at home daily Bring blood pressure log and machine to next visit BMP today since adding chlorthalidone Follow-up in about 2 weeks in clinic

## 2023-09-27 NOTE — Patient Instructions (Signed)
 Your blood pressure goal is < 130/63mmHg   Try to get into a routine of taking your medications Try taking most of them in the evening and the water pill in the AM  [facility] will pre-pack your medications and mail them to you  Please start checking blood pressure daily   Important lifestyle changes to control high blood pressure  Intervention  Effect on the BP   Weight loss Weight loss is one of the most effective lifestyle changes for controlling blood pressure. If you're overweight or obese, losing even a small amount of weight can help reduce blood pressure.    Blood pressure can decrease by 1 millimeter of mercury (mmHg) with each kilogram (about 2.2 pounds) of weight lost.   Exercise regularly As a general goal, aim for 30 minutes of moderate physical activity every day.    Regular physical activity can lower blood pressure by 5 - 8 mmHg.   Eat a healthy diet Eat a diet rich in whole grains, fruits, vegetables, lean meat, and low-fat dairy products. Limit processed foods, saturated fat, and sweets.    A heart-healthy diet can lower high blood pressure by 10 mmHg.   Reduce salt (sodium) in your diet Aim for 000mg  of sodium each day. Avoid deli meats, canned food, and frozen microwave meals which are high in sodium.     Limiting sodium can reduce blood pressure by 5 mmHg.   Limit alcohol One drink equals 12 ounces of beer, 5 ounces of wine, or 1.5 ounces of 80-proof liquor.    Limiting alcohol to < 1 drink a day for women or < 2 drinks a day for men can help lower blood pressure by about 4 mmHg.   To check your pressure at home you will need to:   Sit up in a chair, with feet flat on the floor and back supported. Do not cross your ankles or legs. Rest your left arm so that the cuff is about heart level. If the cuff goes on your upper arm, then just relax your arm on the table, arm of the chair, or your lap. If you have a wrist cuff, hold your wrist against your  chest at heart level. Place the cuff snugly around your arm, about 1 inch above the crease of your elbow. The cords should be inside the groove of your elbow.  Sit quietly, with the cuff in place, for about 5 minutes. Then press the power button to start a reading. Do not talk or move while the reading is taking place.  Record your readings on a sheet of paper. Although most cuffs have a memory, it is often easier to see a pattern developing when the numbers are all in front of you.  You can repeat the reading after 1-3 minutes if it is recommended.   Make sure your bladder is empty and you have not had caffeine or tobacco within the last 30 minutes   Always bring your blood pressure log with you to your appointments. If you have not brought your monitor in to be double checked for accuracy, please bring it to your next appointment.   You can find a list of validated (accurate) blood pressure cuffs at: validatebp.org

## 2023-09-28 ENCOUNTER — Other Ambulatory Visit: Payer: Self-pay

## 2023-09-28 DIAGNOSIS — E782 Mixed hyperlipidemia: Secondary | ICD-10-CM | POA: Diagnosis not present

## 2023-09-28 DIAGNOSIS — I493 Ventricular premature depolarization: Secondary | ICD-10-CM | POA: Diagnosis not present

## 2023-09-28 DIAGNOSIS — I10 Essential (primary) hypertension: Secondary | ICD-10-CM | POA: Diagnosis not present

## 2023-09-29 ENCOUNTER — Ambulatory Visit (INDEPENDENT_AMBULATORY_CARE_PROVIDER_SITE_OTHER): Admitting: Student

## 2023-09-29 ENCOUNTER — Encounter: Payer: Self-pay | Admitting: Internal Medicine

## 2023-09-29 ENCOUNTER — Other Ambulatory Visit: Payer: Self-pay

## 2023-09-29 ENCOUNTER — Encounter (HOSPITAL_COMMUNITY): Payer: Self-pay | Admitting: Student

## 2023-09-29 DIAGNOSIS — M5441 Lumbago with sciatica, right side: Secondary | ICD-10-CM

## 2023-09-29 DIAGNOSIS — M5442 Lumbago with sciatica, left side: Secondary | ICD-10-CM | POA: Diagnosis not present

## 2023-09-29 DIAGNOSIS — R413 Other amnesia: Secondary | ICD-10-CM | POA: Diagnosis not present

## 2023-09-29 DIAGNOSIS — E559 Vitamin D deficiency, unspecified: Secondary | ICD-10-CM | POA: Diagnosis not present

## 2023-09-29 DIAGNOSIS — Z8669 Personal history of other diseases of the nervous system and sense organs: Secondary | ICD-10-CM | POA: Diagnosis not present

## 2023-09-29 DIAGNOSIS — F4310 Post-traumatic stress disorder, unspecified: Secondary | ICD-10-CM

## 2023-09-29 DIAGNOSIS — G8929 Other chronic pain: Secondary | ICD-10-CM

## 2023-09-29 DIAGNOSIS — Z720 Tobacco use: Secondary | ICD-10-CM

## 2023-09-29 DIAGNOSIS — F332 Major depressive disorder, recurrent severe without psychotic features: Secondary | ICD-10-CM | POA: Diagnosis not present

## 2023-09-29 LAB — BASIC METABOLIC PANEL WITH GFR
BUN/Creatinine Ratio: 19 (ref 9–23)
BUN: 14 mg/dL (ref 6–24)
CO2: 28 mmol/L (ref 20–29)
Calcium: 9.7 mg/dL (ref 8.7–10.2)
Chloride: 102 mmol/L (ref 96–106)
Creatinine, Ser: 0.73 mg/dL (ref 0.57–1.00)
Glucose: 87 mg/dL (ref 70–99)
Potassium: 3.5 mmol/L (ref 3.5–5.2)
Sodium: 143 mmol/L (ref 134–144)
eGFR: 98 mL/min/{1.73_m2} (ref >59)

## 2023-09-29 MED ORDER — PRAZOSIN HCL 1 MG PO CAPS
1.0000 mg | ORAL_CAPSULE | Freq: Every day | ORAL | 1 refills | Status: DC
  Filled 2023-09-29: qty 30, 30d supply, fill #0
  Filled 2023-10-06 – 2023-10-25 (×3): qty 30, 30d supply, fill #1
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
## 2023-09-30 ENCOUNTER — Other Ambulatory Visit: Payer: Self-pay

## 2023-09-30 NOTE — Addendum Note (Signed)
 Addended by: Ulysess Gang A on: 09/30/2023 01:52 PM   Modules accepted: Level of Service

## 2023-10-03 ENCOUNTER — Ambulatory Visit (INDEPENDENT_AMBULATORY_CARE_PROVIDER_SITE_OTHER): Admitting: Clinical

## 2023-10-03 ENCOUNTER — Other Ambulatory Visit (INDEPENDENT_AMBULATORY_CARE_PROVIDER_SITE_OTHER)

## 2023-10-03 DIAGNOSIS — E559 Vitamin D deficiency, unspecified: Secondary | ICD-10-CM

## 2023-10-03 DIAGNOSIS — R413 Other amnesia: Secondary | ICD-10-CM

## 2023-10-03 DIAGNOSIS — Z79899 Other long term (current) drug therapy: Secondary | ICD-10-CM | POA: Diagnosis not present

## 2023-10-03 DIAGNOSIS — F332 Major depressive disorder, recurrent severe without psychotic features: Secondary | ICD-10-CM | POA: Diagnosis not present

## 2023-10-03 DIAGNOSIS — F4310 Post-traumatic stress disorder, unspecified: Secondary | ICD-10-CM

## 2023-10-03 NOTE — Progress Notes (Signed)
 Pt tolerated lab draws in right arm.    JNL, CMA

## 2023-10-03 NOTE — Progress Notes (Signed)
 THERAPIST PROGRESS NOTE  Session Time: 45 minutes  Participation Level: Active  Behavioral Response: CasualAlertDepressed  Type of Therapy: Individual Therapy  Treatment Goals addressed:  Reduce frequency, intensity, and duration of depression symptoms so that daily functioning is improved by self report 1x per session   ProgressTowards Goals: Progressing  Interventions: CBT and Supportive  Summary:  Tina Moss is a 53 y.o. female who presents for the scheduled appointment oriented x 5, appropriately dressed, and friendly.  Client denied hallucinations and delusions. Client reported today she is doing about the same.  Client reported she continues to deal with depressive and anxiety symptoms due to psychosocial stressors and family issues.  Client reported her granddaughter recently had surgery and that was hard for her to see her grand daughter be uncomfortable.  Client reported within the past week her mother had to be rushed to the hospital due to a presumably unexplained illness.  Client reported she is not in agreement with what the doctors are saying is the cause of the sickness and wants to have her followed up with them.  Client reported it has been a lot for her to cope and deal with running behind her mother with her to her doctors appointments and tending to her needs.  Client reported nobody else in her family steps up to help out with their mother.  Client reported living in the house with her sister she has been grateful for but it comes with a lot of stressors such as the 37 nieces and nephews that she has between her sister's kids as well as other people in the neighborhood who are frequently over at the house.  Client reported she has a lot that she wants to say but does not because it is not her house.  Client reported she is still waiting to hear back from Social Security about a determination so she can plan for the future of having her own place.  Client reported she  has been looking around that places but is being told that she cannot be assisted because she is not 61 yet.  Client reported she struggles with a lot of aches and throbbing pains constantly in her legs and back.  Client reported she wishes that she could work but she can barely withstand doing daily activities that she needs to get done for herself.  Client reported she needs outlets where she can be emotionally supportive because her family does not understand. Evidence of progress towards goal:  client reported 1 source of her negative emotions contribute to family and lack of community resources. Client reported 1 positive of also expressing her emotions and communicating with others appropriately and not being confrontational.  Suicidal/Homicidal: Nowithout intent/plan  Therapist Response:  Therapist began the appointment asking client how she has been doing since last seen. Therapist engaged with active listening and positive emotional support. Therapist used CBT to ask client open-ended questions about contributing stressors that caused her to feel depressed, irritable and anxious. Therapist used CBT to normalize the clients emotional response within reason and positively reinforced her ability to regulate her emotions before reacting in situations. Therapist used CBT to discuss positive coping skills as well as give her information to community support Center for women in [place]. Therapist used CBT ask the client to identify her progress with frequency of use with coping skills with continued practice in her daily activity.    Therapist gave the client homework to reach out to the women's resource center  as well as continuing to practice boundaries and positive communication skills.   Plan: Return again in 4 weeks.  Diagnosis: Severe episode of recurrent major depressive disorder, without psychotic features  Collaboration of Care: Patient refused AEB none requested by the  client.  Patient/Guardian was advised Release of Information must be obtained prior to any record release in order to collaborate their care with an outside provider. Patient/Guardian was advised if they have not already done so to contact the registration department to sign all necessary forms in order for us  to release information regarding their care.   Consent: Patient/Guardian gives verbal consent for treatment and assignment of benefits for services provided during this visit. Patient/Guardian expressed understanding and agreed to proceed.   Jacquel Mccamish Y Praneeth Bussey, LCSW 10/03/2023

## 2023-10-05 ENCOUNTER — Encounter: Payer: Self-pay | Admitting: Internal Medicine

## 2023-10-05 ENCOUNTER — Other Ambulatory Visit: Payer: Self-pay

## 2023-10-05 DIAGNOSIS — I493 Ventricular premature depolarization: Secondary | ICD-10-CM

## 2023-10-06 ENCOUNTER — Other Ambulatory Visit: Payer: Self-pay

## 2023-10-06 ENCOUNTER — Other Ambulatory Visit: Payer: Self-pay | Admitting: Physician Assistant

## 2023-10-06 LAB — VITAMIN B12: Vitamin B-12: 908 pg/mL (ref 232–1245)

## 2023-10-06 LAB — TSH+FREE T4
Free T4: 1.53 ng/dL (ref 0.82–1.77)
TSH: 0.859 u[IU]/mL (ref 0.450–4.500)

## 2023-10-06 LAB — VITAMIN D 25 HYDROXY (VIT D DEFICIENCY, FRACTURES): Vit D, 25-Hydroxy: 13.6 ng/mL — ABNORMAL LOW (ref 30.0–100.0)

## 2023-10-06 MED ORDER — MELOXICAM 7.5 MG PO TABS
7.5000 mg | ORAL_TABLET | Freq: Every day | ORAL | 0 refills | Status: DC
  Filled 2023-10-25: qty 30, 30d supply, fill #0

## 2023-10-07 ENCOUNTER — Encounter: Payer: Self-pay | Admitting: Physician Assistant

## 2023-10-13 ENCOUNTER — Ambulatory Visit: Attending: Family Medicine | Admitting: Family Medicine

## 2023-10-13 ENCOUNTER — Encounter: Payer: Self-pay | Admitting: Family Medicine

## 2023-10-13 ENCOUNTER — Other Ambulatory Visit (HOSPITAL_COMMUNITY): Payer: Self-pay

## 2023-10-13 VITALS — BP 127/89 | HR 90 | Ht 66.5 in | Wt 221.2 lb

## 2023-10-13 DIAGNOSIS — R7303 Prediabetes: Secondary | ICD-10-CM

## 2023-10-13 DIAGNOSIS — G5603 Carpal tunnel syndrome, bilateral upper limbs: Secondary | ICD-10-CM | POA: Diagnosis not present

## 2023-10-13 DIAGNOSIS — I10 Essential (primary) hypertension: Secondary | ICD-10-CM

## 2023-10-13 DIAGNOSIS — M5136 Other intervertebral disc degeneration, lumbar region with discogenic back pain only: Secondary | ICD-10-CM

## 2023-10-13 LAB — POCT GLYCOSYLATED HEMOGLOBIN (HGB A1C): HbA1c, POC (prediabetic range): 6.1 % (ref 5.7–6.4)

## 2023-10-13 NOTE — Telephone Encounter (Signed)
-----   Message from Jann Melody sent at 10/05/2023 10:40 AM EDT ----- Results: Frequent PVCs Improved medication adherence Persistent hypertension Plan: Increase metoprolol  dose to 75 mg  Jann Melody, MD

## 2023-10-13 NOTE — Patient Instructions (Signed)
 Pinched Nerve in the Wrist (Carpal Tunnel Syndrome): What to Know  Pinched nerve in the wrist (carpal tunnel syndrome, or CTS) is a nerve problem that causes pain, numbness, and weakness in the wrist, hand, and fingers. The carpal tunnel is a narrow space that is on the palm side of your wrist. Repeated wrist motions or certain diseases may cause swelling in the tunnel. This swelling can pinch the main nerve in the wrist (the median nerve). What are the causes? CTS may be caused by: Moving your hand and wrist over and over again while doing a task. Hurting the wrist. Arthritis. A pocket of fluid (cyst) or a growth (tumor) in the carpal tunnel. Fluid buildup when you are pregnant. Use of tools that vibrate. In some cases, the cause of CTS is not known. What increases the risk? You're more likely to have CTS if: You have a job that makes you do these things: Move your hand firmly over and over again. Work with tools that vibrate, such as drills or sanders. You're female. You have diabetes, obesity, thyroid problems, or kidney failure. What are the signs or symptoms? Symptoms of this condition include: A tingling feeling in your fingers. You may feel this pain in the thumb, index finger, or middle finger. Tingling or loss of feeling in your hand. Pain in your entire arm. This pain may get worse when you bend your wrist and elbow for a long time. Pain in your wrist that goes up your arm to your shoulder. Pain that goes down into your palm or fingers. Weakness in your hands. You may find it hard to grab and hold items. Your symptoms may feel worse during the night. How is this diagnosed? CTS is diagnosed with a medical history and physical exam. Tests and imaging may also be done to: Check the electrical signals sent by your nerves into the muscles. Check how well electrical signals pass through your nerves. Check possible causes of your CTS. These include X-rays, ultrasound, and  MRI. How is this treated? CTS may be treated with: Lifestyle changes. You will be asked to stop or change the activity that caused your problem. Physical therapy. This may include: Exercises that stretch and strengthen the muscles and tendons in the wrist and hand. Nerve gliding or flossing exercises. These help keep nerves moving smoothly through the tissues around them. Occupational therapy. You'll learn how to use your hand again. Medicines for pain and swelling. You may have injections in your wrist. A wrist splint or brace. Surgery. Follow these instructions at home: If you have a splint or brace: Wear the splint or brace as told. Take it off only if your provider says you can. Check the skin around it every day. Tell your provider if you see problems. Loosen the splint or brace if your fingers tingle, are numb, or turn cold and blue. Keep the splint or brace clean and dry. If the splint or brace isn't waterproof: Do not let it get wet. Cover it when you take a bath or shower. Use a cover that doesn't let any water in. Managing pain, stiffness, and swelling  Use ice or an ice pack as told. If you have a splint or brace that you can take off, remove it only as told. Place a towel between your skin and the ice. Leave the ice on for 20 minutes, 2-3 times a day. If your skin turns red, take off the ice right away to prevent skin damage. The risk  of damage is higher if you can't feel pain, heat, or cold. Move your fingers often to reduce stiffness and swelling. General instructions Take your medicines only as told. Rest your wrist and hand from activity that may cause pain. If your CTS is caused by things you do at work, talk with your employer about making changes. For example, you may need a wrist pad to use while typing. Exercise as told. Follow instructions on how to do nerve gliding or flossing exercises. These help keep nerves in moving smoothly through the tissues around  them. Keep all follow-up visits. This is important. Where to find more information American Academy of Orthopedic Surgeons: orthoinfo.aaos.[employer] of Neurological Disorders and Stroke: BasicFM.no Contact a health care provider if: You have new symptoms. Your pain is not controlled with medicines. Your symptoms get worse. Get help right away if: Your hand or wrist tingles or is numb, and the symptoms become very bad. This information is not intended to replace advice given to you by your health care provider. Make sure you discuss any questions you have with your health care provider. Document Revised: 04/12/2023 Document Reviewed: 01/28/2023 Elsevier Patient Education  2024 [employer].

## 2023-10-13 NOTE — Telephone Encounter (Signed)
 The patient has been notified of the result and verbalized understanding.  All questions (if any) were answered. Tina Du Donyae Kilner, RN 10/13/2023 4:17 PM   Pt reports she does not take metoprolol  tartrate; gets pill packs and med is not included.

## 2023-10-13 NOTE — Telephone Encounter (Signed)
 Called pt reviewed results and MD recommendations:  Results:  Frequent PVCs  Improved medication adherence  Persistent hypertension  Plan:  Increase metoprolol  dose to 75 mg    Jann Melody, MD   Pt reports does not take metoprolol  at all.  Advised pt med was sent to pharmacy by PCP on 09/27/23.  Pt reports doesn't have medication.   I called pharmacy to f/u was advised pt picked up a 3 month supply in Feb and med is d/t be added to pill pack on 5/13 dispense. Called pt advised of this information.  Pt reports does not recall medication but will search for medication in room.  If she finds med will start taking if not will contact our office.

## 2023-10-13 NOTE — Progress Notes (Signed)
 Subjective:  Patient ID: Tina Moss, female    DOB: 12-05-1970  Age: 53 y.o. MRN: [id number]  CC: Medical Management of Chronic Issues (Back pain/Leg pain)     Discussed the use of AI scribe software for clinical note transcription with the patient, who gave verbal consent to proceed.  History of Present Illness Tina Moss is a 53 year old female with  a history of seizures (seizure free for several years and is not currently on antiseizure medication), multiple sclerosis, L MCA CVA in 03/2017, history of syphilis (diagnosed in 11/2021, status posttreatment with Penicillin  G  who presents with back and leg pain.  She experiences sharp lower back and leg pain, particularly when getting up, with radiation up the back of her legs. The pain occurs regardless of sitting or walking and intensifies with prolonged walking and standing. Knee pain is described as shooting pain inside her knees, with a sensation of tiredness and pain upon use. Lumbar spine x-ray from 2024 had revealed degenerative disc disease and L4-L5. She is currently using meloxicam  for pain management.  Numbness is present on the right side of her body, including her hands, with a sensation of numbness occurring while sitting or lying down, often feeling like a 'light vibration' through her hand. She attempts to regain feeling by touching and bending her hand, sometimes waking up at night needing to shake her hand to relieve the numbness.  At her last visit she was dyspneic and underwent workup for evaluation of CHF.  Echocardiogram revealed normal EF.  She had been referred to the cardiology with diagnosis of PVCs and cardiac monitor recommended.  She also had a visit with the Cardiology pharmacist and had her medication reconciliation done and not receiving her medications in bubble packs which are beneficial for her.    Past Medical History:  Diagnosis Date   Anemia    CVA (cerebral vascular accident) (HCC)    2020    Epilepsy (HCC)    Fibroids    H/O bacterial infection    H/O mumps    Hyperlipidemia    Hypertension    Seizures (HCC)     Past Surgical History:  Procedure Laterality Date   CESAREAN SECTION     x2. at term   TEE WITHOUT CARDIOVERSION N/A 04/06/2018   Procedure: TRANSESOPHAGEAL ECHOCARDIOGRAM (TEE) BUBBLE STUDY;  Surgeon: Lenise Quince, MD;  Location: [facility] ENDOSCOPY;  Service: Cardiovascular;  Laterality: N/A;   TUBAL LIGATION     WISDOM TOOTH EXTRACTION      Family History  Problem Relation Age of Onset   Hypertension Mother    Hypertension Sister    Arthritis Sister        knees   Mental illness Daughter        ? bipolar   Hypertension Brother    Deafness Brother    Speech disorder Brother        mute   Mental illness Brother        "he can just fly off"   Scoliosis Son    Breast cancer Neg Hx     Social History   Socioeconomic History   Marital status: Divorced    Spouse name: n/a   Number of children: 2   Years of education: 11th grade   Highest education level: 11th grade  Occupational History   Occupation: Housekeeper    Comment: [employer]  Tobacco Use   Smoking status: Former    Types: Cigars  Smokeless tobacco: Never   Tobacco comments:    "I think I just keep so much on my mind."  Vaping Use   Vaping status: Never Used  Substance and Sexual Activity   Alcohol use: Not Currently    Alcohol/week: 0.0 - 3.0 standard drinks of alcohol    Comment: Stopped   Drug use: Not Currently    Types: Marijuana    Comment: Stopped   Sexual activity: Not Currently    Partners: Male    Birth control/protection: None  Other Topics Concern   Not on file  Social History Narrative   Lives with her daughter.   Son is currently incarcerated in [place].   She completed 11th grade, but was kicked out and never when back, as she had become pregnant and was raising her daughter.   Divorced x 2.   Right handed   Drinks caffeine   One story home   Social Drivers  of Health   Financial Resource Strain: Not on file  Food Insecurity: Patient Declined (05/06/2023)   Hunger Vital Sign    Worried About Running Out of Food in the Last Year: Patient declined    Ran Out of Food in the Last Year: Patient declined  Transportation Needs: Patient Declined (05/06/2023)   PRAPARE - Administrator, Civil Service (Medical): Patient declined    Lack of Transportation (Non-Medical): Patient declined  Physical Activity: Not on file  Stress: Not on file  Social Connections: Not on file    No Known Allergies  Outpatient Medications Prior to Visit  Medication Sig Dispense Refill   albuterol  (VENTOLIN  HFA) 108 (90 Base) MCG/ACT inhaler Inhale 1-2 puffs into the lungs every 6 (six) hours as needed for wheezing or shortness of breath. 18 g 0   atorvastatin  (LIPITOR) 80 MG tablet Take 1 tablet (80 mg total) by mouth daily. 90 tablet 1   cetirizine  (ZYRTEC ) 10 MG tablet Take 1 tablet (10 mg total) by mouth daily. 90 tablet 1   chlorthalidone  (HYGROTON ) 25 MG tablet Take 1 tablet (25 mg total) by mouth daily. 90 tablet 1   clopidogrel  (PLAVIX ) 75 MG tablet Take 1 tablet (75 mg total) by mouth daily. 90 tablet 1   diclofenac  Sodium (VOLTAREN ) 1 % GEL Apply 2 g topically 4 (four) times daily. 100 g 2   donepezil  (ARICEPT ) 10 MG tablet Take 1 tablet (10 mg total) by mouth daily. 30 tablet 11   FLUoxetine  (PROZAC ) 20 MG capsule Take 1 capsule (20 mg total) by mouth daily. 90 capsule 1   furosemide  (LASIX ) 20 MG tablet Take 1 tablet (20 mg total) by mouth daily. 90 tablet 1   losartan  (COZAAR ) 50 MG tablet Take 1 tablet (50 mg total) by mouth daily. 90 tablet 1   meloxicam  (MOBIC ) 7.5 MG tablet Take 1 tablet (7.5 mg total) by mouth daily. 30 tablet 0   metoprolol  tartrate (LOPRESSOR ) 50 MG tablet Take 1 tablet (50 mg total) by mouth 2 (two) times daily. 180 tablet 1   oxybutynin  (DITROPAN  XL) 5 MG 24 hr tablet Take 1 tablet (5 mg total) by mouth at bedtime. For  overactive bladder 90 tablet 1   prazosin  (MINIPRESS ) 1 MG capsule Take 1 capsule (1 mg total) by mouth at bedtime. 30 capsule 1   tiZANidine  (ZANAFLEX ) 4 MG tablet Take 1 tablet (4 mg total) by mouth every 8 (eight) hours as needed. 60 tablet 1   No facility-administered medications prior to visit.  ROS Review of Systems  Constitutional:  Negative for activity change and appetite change.  HENT:  Negative for sinus pressure and sore throat.   Respiratory:  Negative for chest tightness, shortness of breath and wheezing.   Cardiovascular:  Negative for chest pain and palpitations.  Gastrointestinal:  Negative for abdominal distention, abdominal pain and constipation.  Genitourinary: Negative.   Musculoskeletal: Negative.        See HPI  Psychiatric/Behavioral:  Negative for behavioral problems and dysphoric mood.     Objective:  BP (!) 142/91   Pulse 90   Ht 5' 6.5" (1.689 m)   Wt 221 lb 3.2 oz (100.3 kg)   LMP  (LMP Unknown) Comment: more than a year since last menstrual  SpO2 96%   BMI 35.17 kg/m      10/13/2023   10:18 AM 09/27/2023   11:58 AM 09/27/2023   11:56 AM  BP/Weight  Systolic BP 142 134 140  Diastolic BP 91 90 90  Wt. (Lbs) 221.2    BMI 35.17 kg/m2        Physical Exam Constitutional:      Appearance: She is well-developed.  Cardiovascular:     Rate and Rhythm: Normal rate.     Heart sounds: Normal heart sounds. No murmur heard. Pulmonary:     Effort: Pulmonary effort is normal.     Breath sounds: Normal breath sounds. No wheezing or rales.  Chest:     Chest wall: No tenderness.  Abdominal:     General: Bowel sounds are normal. There is no distension.     Palpations: Abdomen is soft. There is no mass.     Tenderness: There is no abdominal tenderness.  Musculoskeletal:        General: Tenderness (on palpation of midline and bilateral lumbar region) present. Normal range of motion.     Right lower leg: No edema.     Left lower leg: No edema.      Comments: Positive Tinel's sign on the left wrist, negative on the right Negative Phalen signs bilaterally  Neurological:     Mental Status: She is alert and oriented to person, place, and time.  Psychiatric:        Mood and Affect: Mood normal.        Latest Ref Rng & Units 09/28/2023   12:06 PM 08/24/2023    4:05 PM 03/30/2023   10:55 AM  CMP  Glucose 70 - 99 mg/dL 87  91  90   BUN 6 - 24 mg/dL 14  14  10    Creatinine 0.57 - 1.00 mg/dL 0.98  1.19  1.47   Sodium 134 - 144 mmol/L 143  141  141   Potassium 3.5 - 5.2 mmol/L 3.5  3.8  4.1   Chloride 96 - 106 mmol/L 102  104  105   CO2 20 - 29 mmol/L 28  27  20    Calcium  8.7 - 10.2 mg/dL 9.7  9.1  9.7   Total Protein 6.0 - 8.5 g/dL  6.9    Total Bilirubin 0.0 - 1.2 mg/dL  0.3    Alkaline Phos 44 - 121 IU/L  136    AST 0 - 40 IU/L  13    ALT 0 - 32 IU/L  17      Lipid Panel     Component Value Date/Time   CHOL 232 (H) 03/30/2023 1055   TRIG 63 03/30/2023 1055   HDL 59 03/30/2023 1055   CHOLHDL 3.9  03/30/2023 1055   CHOLHDL 2.9 04/05/2018 0535   VLDL 13 04/05/2018 0535   LDLCALC 162 (H) 03/30/2023 1055    CBC    Component Value Date/Time   WBC 3.5 03/30/2023 1055   WBC 3.2 (L) 09/10/2021 0923   RBC 4.74 03/30/2023 1055   RBC 4.53 09/10/2021 0923   HGB 14.4 03/30/2023 1055   HCT 43.6 03/30/2023 1055   PLT 230 03/30/2023 1055   MCV 92 03/30/2023 1055   MCH 30.4 03/30/2023 1055   MCH 31.2 04/02/2021 2034   MCHC 33.0 03/30/2023 1055   MCHC 32.7 09/10/2021 0923   RDW 13.0 03/30/2023 1055   LYMPHSABS 1.4 10/16/2021 0843   MONOABS 0.4 04/02/2021 2034   EOSABS 0.0 10/16/2021 0843   BASOSABS 0.0 10/16/2021 0843    Lab Results  Component Value Date   HGBA1C 5.8 (A) 02/01/2023       Assessment & Plan Chronic Low Back Pain Chronic low back pain persists, exacerbated by prolonged walking and standing. Previous lumbar spine x-ray revealed degenerative disc disease. - Refer to physical therapy for back pain  management.   Carpal Tunnel Syndrome Symptoms consistent with carpal tunnel syndrome. Differential diagnosis includes diabetes-related neuropathy, but A1c indicates prediabetes. Orthopedic evaluation is necessary. - Refer to orthopedics for evaluation and possible injection. - Consider use of wrist brace for symptom relief.  Hypertension Blood pressure remains elevated. Medication management system has improved adherence. - Repeat blood pressure measurement. -Counseled on blood pressure goal of less than 130/80, low-sodium, DASH diet, medication compliance, 150 minutes of moderate intensity exercise per week. Discussed medication compliance, adverse effects.   Prediabetes A1c is 6.1, indicating prediabetes. Family history of diabetes present. - Check A1c to monitor for diabetes.      No orders of the defined types were placed in this encounter.   Follow-up: No follow-ups on file.       Joaquin Mulberry, MD, FAAFP. [facility] [place], [place] [phone]   10/13/2023, 10:42 AM

## 2023-10-17 ENCOUNTER — Ambulatory Visit: Attending: Cardiovascular Disease | Admitting: Pharmacist

## 2023-10-17 ENCOUNTER — Other Ambulatory Visit: Payer: Self-pay

## 2023-10-17 ENCOUNTER — Telehealth: Payer: Self-pay | Admitting: Pharmacist

## 2023-10-17 VITALS — BP 149/100 | HR 82

## 2023-10-17 DIAGNOSIS — I10 Essential (primary) hypertension: Secondary | ICD-10-CM | POA: Insufficient documentation

## 2023-10-17 NOTE — Assessment & Plan Note (Signed)
 Assessment: Blood pressure elevated in clinic today I am concerned that not all of her medications mated into her pill pack and that she is not taking her chlorthalidone  or furosemide  or donepezil .  These medications say they have never been filled at [facility] and are due to be filled which makes me suspect they did not make it into the pill pack because they were not due to be filled through insurance at that time Reports more swelling than normal Blood pressure higher than it was at previous visit Pill packs due to be refilled 5/15 Not taking metoprolol   Plan: I have asked patient to call me when she gets home and we will review all the medications that are in her pill pack If there are medications not in her pill pack as I suspect that I will have her take those separately until her pill pack renews and hopefully all her medications at that time will be in there I have asked her to bring her pill pack, blood pressure cuff and list of blood pressure readings to her next appointment with me on 5/29

## 2023-10-17 NOTE — Telephone Encounter (Signed)
 Patient called back after her visit. The medications in her pill pack are:  Clopidogrel  Fluoxetine  Oxybutynin  Meloxicam  Sertraline Atorvastatin  Losartan  Prazosin    As suspected, the following are not in the pill pack yet and patient has not been taking  Donepezil  Chlorthalidone  Furosemide  Metoprolol   Patient was instructed to start taking donepezil , furosemide  and chlorthalidone  from her pill bottles. She did find them and put them with her pill pack while we were on the phone. Metoprolol  she does not have. It will be filled and placed in her pill back 5/15.  She will f/u with me in office 5/29

## 2023-10-17 NOTE — Progress Notes (Signed)
 Patient ID: Tina Moss                 DOB: Mar 22, 1971                      MRN: [id number]      HPI: Tina Moss is a 53 y.o. female referred by Dr. Paulita Boss to HTN clinic. PMH is significant for HTN, memory impairment, LM cerebral artery stroke. She was seen by Dr. Paulita Boss 09/09/23.  Medication compliance was questioned and blood pressure was elevated 140/92.    Patient last seen in clinic 09/27/2023.  At that time compliance seem to be the biggest issue.  Her blood pressure at that visit was 134/90 with a heart rate of 91.  With the help of [place] over at [facility] she was transition to pill packs with Maryan Smalling.  She was provided written information on all the medication she takes and why she is taking them.  We discussed keeping her medications on her bed or her dresser and encouraged her not to skip doses.  She was asked to check her blood pressure daily and bring her log and monitor with her.  Patient presents today.  She did not bring in her blood pressure log or her medications.  She did not realize that she was seeing me today so she forgot them.  She has not taken her blood pressure medications yet this morning but does insist that she takes them every morning on a regular basis.  States this morning she was rushing.  She recalls her blood pressure yesterday being 146/94 heart rate 75 after resting for a little while.  Reports more swelling than usual.  Pitting edema seen in the right ankle.  Her pill packs have her take a bunch of medications at 8 AM and then 1 medication at 10 PM.  She reports she has some dizziness that improves when she lies down.  Denies any shortness of breath.  Mild headache not requiring any medication.  Current HTN meds: chlorthalidone  25mg  daily, losartan  50mg  daily, furosemide  20mg  daily Previously tried:  BP goal: <130/80  Family History:  Family History  Problem Relation Age of Onset   Hypertension Mother    Hypertension  Sister    Arthritis Sister        knees   Mental illness Daughter        ? bipolar   Hypertension Brother    Deafness Brother    Speech disorder Brother        mute   Mental illness Brother        "he can just fly off"   Scoliosis Son    Breast cancer Neg Hx      Social History: no tobacco, no ETOH  Diet: some tea, no coffee  Exercise:  Going to start PT soon  Home BP readings: 146/94 heart rate 75    Wt Readings from Last 3 Encounters:  10/13/23 221 lb 3.2 oz (100.3 kg)  09/09/23 217 lb (98.4 kg)  08/24/23 220 lb 9.6 oz (100.1 kg)   BP Readings from Last 3 Encounters:  10/17/23 (!) 149/100  10/13/23 127/89  09/27/23 (!) 134/90   Pulse Readings from Last 3 Encounters:  10/17/23 82  10/13/23 90  09/27/23 91    Renal function: Estimated Creatinine Clearance: 97 mL/min (by C-G formula based on SCr of 0.73 mg/dL).  Past Medical History:  Diagnosis Date   Anemia  CVA (cerebral vascular accident) (HCC)    2020   Epilepsy (HCC)    Fibroids    H/O bacterial infection    H/O mumps    Hyperlipidemia    Hypertension    Seizures (HCC)     Current Outpatient Medications on File Prior to Visit  Medication Sig Dispense Refill   albuterol  (VENTOLIN  HFA) 108 (90 Base) MCG/ACT inhaler Inhale 1-2 puffs into the lungs every 6 (six) hours as needed for wheezing or shortness of breath. 18 g 0   atorvastatin  (LIPITOR) 80 MG tablet Take 1 tablet (80 mg total) by mouth daily. 90 tablet 1   cetirizine  (ZYRTEC ) 10 MG tablet Take 1 tablet (10 mg total) by mouth daily. 90 tablet 1   chlorthalidone  (HYGROTON ) 25 MG tablet Take 1 tablet (25 mg total) by mouth daily. 90 tablet 1   clopidogrel  (PLAVIX ) 75 MG tablet Take 1 tablet (75 mg total) by mouth daily. 90 tablet 1   diclofenac  Sodium (VOLTAREN ) 1 % GEL Apply 2 g topically 4 (four) times daily. 100 g 2   donepezil  (ARICEPT ) 10 MG tablet Take 1 tablet (10 mg total) by mouth daily. 30 tablet 11   FLUoxetine  (PROZAC ) 20 MG  capsule Take 1 capsule (20 mg total) by mouth daily. 90 capsule 1   furosemide  (LASIX ) 20 MG tablet Take 1 tablet (20 mg total) by mouth daily. 90 tablet 1   losartan  (COZAAR ) 50 MG tablet Take 1 tablet (50 mg total) by mouth daily. 90 tablet 1   meloxicam  (MOBIC ) 7.5 MG tablet Take 1 tablet (7.5 mg total) by mouth daily. 30 tablet 0   metoprolol  tartrate (LOPRESSOR ) 50 MG tablet Take 1 tablet (50 mg total) by mouth 2 (two) times daily. 180 tablet 1   oxybutynin  (DITROPAN  XL) 5 MG 24 hr tablet Take 1 tablet (5 mg total) by mouth at bedtime. For overactive bladder 90 tablet 1   prazosin  (MINIPRESS ) 1 MG capsule Take 1 capsule (1 mg total) by mouth at bedtime. 30 capsule 1   tiZANidine  (ZANAFLEX ) 4 MG tablet Take 1 tablet (4 mg total) by mouth every 8 (eight) hours as needed. 60 tablet 1   No current facility-administered medications on file prior to visit.    No Known Allergies  Blood pressure (!) 149/100, pulse 82.   Assessment/Plan: HYPERTENSION CONTROL Vitals:   10/17/23 1126 10/17/23 1129  BP: (!) 150/100 (!) 149/100    The patient's blood pressure is elevated above target today.  In order to address the patient's elevated BP: Blood pressure will be monitored at home to determine if medication changes need to be made.; A current anti-hypertensive medication was adjusted today.      1. Hypertension -  Essential hypertension Assessment: Blood pressure elevated in clinic today I am concerned that not all of her medications mated into her pill pack and that she is not taking her chlorthalidone  or furosemide  or donepezil .  These medications say they have never been filled at [facility] and are due to be filled which makes me suspect they did not make it into the pill pack because they were not due to be filled through insurance at that time Reports more swelling than normal Blood pressure higher than it was at previous visit Pill packs due to be refilled 5/15 Not taking  metoprolol   Plan: I have asked patient to call me when she gets home and we will review all the medications that are in her pill pack If there  are medications not in her pill pack as I suspect that I will have her take those separately until her pill pack renews and hopefully all her medications at that time will be in there I have asked her to bring her pill pack, blood pressure cuff and list of blood pressure readings to her next appointment with me on 5/29   Thank you  Courtland Reas D Pixie Burgener, Pharm.D, BCACP, CPP [facility] A Division of [facility] [address], [place], [place] [address]  Phone: [phone]; Fax: [phone]

## 2023-10-17 NOTE — Patient Instructions (Signed)
 Please call me when you get home so we can make sure everything is in your pill pack that should be in there. [phone]  Your blood pressure goal is < 130/52mmHg   Please bring your blood pressure cuff, pill packs and list of blood pressure readings to your next appointment  Important lifestyle changes to control high blood pressure  Intervention  Effect on the BP   Weight loss Weight loss is one of the most effective lifestyle changes for controlling blood pressure. If you're overweight or obese, losing even a small amount of weight can help reduce blood pressure.    Blood pressure can decrease by 1 millimeter of mercury (mmHg) with each kilogram (about 2.2 pounds) of weight lost.   Exercise regularly As a general goal, aim for 30 minutes of moderate physical activity every day.    Regular physical activity can lower blood pressure by 5 - 8 mmHg.   Eat a healthy diet Eat a diet rich in whole grains, fruits, vegetables, lean meat, and low-fat dairy products. Limit processed foods, saturated fat, and sweets.    A heart-healthy diet can lower high blood pressure by 10 mmHg.   Reduce salt (sodium) in your diet Aim for 000mg  of sodium each day. Avoid deli meats, canned food, and frozen microwave meals which are high in sodium.     Limiting sodium can reduce blood pressure by 5 mmHg.   Limit alcohol One drink equals 12 ounces of beer, 5 ounces of wine, or 1.5 ounces of 80-proof liquor.    Limiting alcohol to < 1 drink a day for women or < 2 drinks a day for men can help lower blood pressure by about 4 mmHg.   To check your pressure at home you will need to:   Sit up in a chair, with feet flat on the floor and back supported. Do not cross your ankles or legs. Rest your left arm so that the cuff is about heart level. If the cuff goes on your upper arm, then just relax your arm on the table, arm of the chair, or your lap. If you have a wrist cuff, hold your wrist against your  chest at heart level. Place the cuff snugly around your arm, about 1 inch above the crease of your elbow. The cords should be inside the groove of your elbow.  Sit quietly, with the cuff in place, for about 5 minutes. Then press the power button to start a reading. Do not talk or move while the reading is taking place.  Record your readings on a sheet of paper. Although most cuffs have a memory, it is often easier to see a pattern developing when the numbers are all in front of you.  You can repeat the reading after 1-3 minutes if it is recommended.   Make sure your bladder is empty and you have not had caffeine or tobacco within the last 30 minutes   Always bring your blood pressure log with you to your appointments. If you have not brought your monitor in to be double checked for accuracy, please bring it to your next appointment.   You can find a list of validated (accurate) blood pressure cuffs at: validatebp.org

## 2023-10-19 ENCOUNTER — Ambulatory Visit (INDEPENDENT_AMBULATORY_CARE_PROVIDER_SITE_OTHER): Admitting: Orthopaedic Surgery

## 2023-10-19 DIAGNOSIS — G5603 Carpal tunnel syndrome, bilateral upper limbs: Secondary | ICD-10-CM

## 2023-10-19 DIAGNOSIS — G5602 Carpal tunnel syndrome, left upper limb: Secondary | ICD-10-CM | POA: Diagnosis not present

## 2023-10-19 DIAGNOSIS — G5601 Carpal tunnel syndrome, right upper limb: Secondary | ICD-10-CM | POA: Diagnosis not present

## 2023-10-19 NOTE — Progress Notes (Signed)
 Office Visit Note   Patient: Tina Moss           Date of Birth: 1970-12-16           MRN: [id number] Visit Date: 10/19/2023              Requested by: Joaquin Mulberry, MD [address] [place],  [place] [address] PCP: Joaquin Mulberry, MD   Assessment & Plan: Visit Diagnoses:  1. Right carpal tunnel syndrome   2. Left carpal tunnel syndrome     Plan: History of Present Illness MAKEL HALLIGAN is a 53 year old female with carpal tunnel syndrome who presents with numbness, tingling, and pain in her hands.  She has experienced these symptoms for several months, with more pronounced issues in her left hand. The sensation occasionally causes her fingers to 'lock up.' She has not undergone nerve conduction studies or used braces. Her symptoms do not cause her to shake her hands or feel as though they fall asleep, but they do wake her at night due to pain.  Physical Exam MUSCULOSKELETAL: No muscle atrophy or skin lesions in hands. Positive Phalen's and Durkan's tests in left hand.  Assessment and Plan Carpal tunnel syndrome Chronic numbness, tingling, and pain in hands, predominantly left, consistent with carpal tunnel syndrome. Positive Phalen's and Durkan's tests confirm diagnosis. Likely median nerve compression at wrist. - Provide wrist braces for nighttime use. - Instruct to wear braces nightly for two weeks. - Advise follow-up if no improvement after two weeks. - Consider nerve conduction studies if no improvement with braces. - Discuss surgical or injection options if symptoms persist or worsen.  Follow-Up Instructions: No follow-ups on file.   Orders:  No orders of the defined types were placed in this encounter.  No orders of the defined types were placed in this encounter.      Subjective: Chief Complaint  Patient presents with   Right Hand - Pain   Left Hand - Pain    HPI  Review of Systems  Constitutional: Negative.   HENT: Negative.     Eyes: Negative.   Respiratory: Negative.    Cardiovascular: Negative.   Endocrine: Negative.   Musculoskeletal: Negative.   Neurological: Negative.   Hematological: Negative.   Psychiatric/Behavioral: Negative.    All other systems reviewed and are negative.    Objective: Vital Signs: LMP  (LMP Unknown) Comment: more than a year since last menstrual  Physical Exam Vitals and nursing note reviewed.  Constitutional:      Appearance: She is well-developed.  HENT:     Head: Atraumatic.     Nose: Nose normal.  Eyes:     Extraocular Movements: Extraocular movements intact.  Cardiovascular:     Pulses: Normal pulses.  Pulmonary:     Effort: Pulmonary effort is normal.  Abdominal:     Palpations: Abdomen is soft.  Musculoskeletal:     Cervical back: Neck supple.  Skin:    General: Skin is warm.     Capillary Refill: Capillary refill takes less than 2 seconds.  Neurological:     Mental Status: She is alert. Mental status is at baseline.  Psychiatric:        Behavior: Behavior normal.        Thought Content: Thought content normal.        Judgment: Judgment normal.     Ortho Exam  Specialty Comments:  No specialty comments available.  Imaging: No results found.  PMFS History: Patient Active Problem List   Diagnosis Date Noted   PTSD (post-traumatic stress disorder) 09/29/2023   PVC (premature ventricular contraction) 09/09/2023   Viral URI 05/20/2023   Primary osteoarthritis involving multiple joints 03/30/2023   Postmenopausal bleeding 03/30/2023   Chronic bilateral low back pain with sciatica 07/12/2022   Chronic pain syndrome 07/12/2022   Prediabetes 10/06/2021   Memory impairment 09/10/2021   History of epilepsy 09/10/2021   Vitamin D  deficiency 07/17/2020   Anxiety state 07/17/2020   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/2020   Hyperlipidemia 09/18/2019   Acute pain of right knee 09/17/2019   Hip pain, acute, right 09/17/2019   Abnormal uterine bleeding (AUB)  02/13/2019   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   Tobacco use 04/04/2018   Heart palpitations 12/14/2016   Major depressive disorder 11/15/2016   Essential hypertension 11/15/2016   Fibroids 01/18/2012   Menorrhagia 01/18/2012   Seizure disorder (HCC) 01/18/2012   Anemia 01/18/2012   Past Medical History:  Diagnosis Date   Anemia    CVA (cerebral vascular accident) (HCC)    2020   Epilepsy (HCC)    Fibroids    H/O bacterial infection    H/O mumps    Hyperlipidemia    Hypertension    Seizures (HCC)     Family History  Problem Relation Age of Onset   Hypertension Mother    Hypertension Sister    Arthritis Sister        knees   Mental illness Daughter        ? bipolar   Hypertension Brother    Deafness Brother    Speech disorder Brother        mute   Mental illness Brother        "he can just fly off"   Scoliosis Son    Breast cancer Neg Hx     Past Surgical History:  Procedure Laterality Date   CESAREAN SECTION     x2. at term   TEE WITHOUT CARDIOVERSION N/A 04/06/2018   Procedure: TRANSESOPHAGEAL ECHOCARDIOGRAM (TEE) BUBBLE STUDY;  Surgeon: Lenise Quince, MD;  Location: [facility];  Service: Cardiovascular;  Laterality: N/A;   TUBAL LIGATION     WISDOM TOOTH EXTRACTION     Social History   Occupational History   Occupation: Housekeeper    Comment: [employer]  Tobacco Use   Smoking status: Former    Types: Cigars   Smokeless tobacco: Never   Tobacco comments:    "I think I just keep so much on my mind."  Vaping Use   Vaping status: Never Used  Substance and Sexual Activity   Alcohol use: Not Currently    Alcohol/week: 0.0 - 3.0 standard drinks of alcohol    Comment: Stopped   Drug use: Not Currently    Types: Marijuana    Comment: Stopped   Sexual activity: Not Currently    Partners: Male    Birth control/protection: None

## 2023-10-25 ENCOUNTER — Other Ambulatory Visit: Payer: Self-pay

## 2023-10-26 ENCOUNTER — Other Ambulatory Visit: Payer: Self-pay

## 2023-10-27 ENCOUNTER — Other Ambulatory Visit: Payer: Self-pay

## 2023-10-27 ENCOUNTER — Ambulatory Visit: Attending: Family Medicine

## 2023-10-27 DIAGNOSIS — M25561 Pain in right knee: Secondary | ICD-10-CM | POA: Diagnosis present

## 2023-10-27 DIAGNOSIS — M5441 Lumbago with sciatica, right side: Secondary | ICD-10-CM | POA: Diagnosis present

## 2023-10-27 DIAGNOSIS — R2689 Other abnormalities of gait and mobility: Secondary | ICD-10-CM | POA: Insufficient documentation

## 2023-10-27 DIAGNOSIS — M5442 Lumbago with sciatica, left side: Secondary | ICD-10-CM | POA: Diagnosis present

## 2023-10-27 DIAGNOSIS — M6281 Muscle weakness (generalized): Secondary | ICD-10-CM | POA: Insufficient documentation

## 2023-10-27 DIAGNOSIS — M5136 Other intervertebral disc degeneration, lumbar region with discogenic back pain only: Secondary | ICD-10-CM | POA: Diagnosis not present

## 2023-10-27 DIAGNOSIS — M25562 Pain in left knee: Secondary | ICD-10-CM | POA: Diagnosis present

## 2023-10-27 DIAGNOSIS — G8929 Other chronic pain: Secondary | ICD-10-CM | POA: Insufficient documentation

## 2023-10-27 NOTE — Therapy (Addendum)
 OUTPATIENT PHYSICAL THERAPY TREATMENT NOTE/DISCHARGE  PHYSICAL THERAPY DISCHARGE SUMMARY  Visits from Start of Care: 1  Current functional level related to goals / functional outcomes: See goals/objective   Remaining deficits: Unable to assess   Education / Equipment: HEP   Patient agrees to discharge. Patient goals were unable to assess. Patient is being discharged due to not returning since the last visit.     Patient Name: Tina Moss MRN: [id number] DOB:1970/06/15, 53 y.o., female Today's Date: 10/28/2023  END OF SESSION:  PT End of Session - 10/28/23 1203     Visit Number 1    Number of Visits 17    Date for PT Re-Evaluation 12/23/23    Authorization Type [employer]    PT Start Time 1315    PT Stop Time 1400    PT Time Calculation (min) 45 min             Past Medical History:  Diagnosis Date   Anemia    CVA (cerebral vascular accident) (HCC)    2020   Epilepsy (HCC)    Fibroids    H/O bacterial infection    H/O mumps    Hyperlipidemia    Hypertension    Seizures (HCC)    Past Surgical History:  Procedure Laterality Date   CESAREAN SECTION     x2. at term   TEE WITHOUT CARDIOVERSION N/A 04/06/2018   Procedure: TRANSESOPHAGEAL ECHOCARDIOGRAM (TEE) BUBBLE STUDY;  Surgeon: Pietro Redell RAMAN, MD;  Location: [facility] ENDOSCOPY;  Service: Cardiovascular;  Laterality: N/A;   TUBAL LIGATION     WISDOM TOOTH EXTRACTION     Patient Active Problem List   Diagnosis Date Noted   PTSD (post-traumatic stress disorder) 09/29/2023   PVC (premature ventricular contraction) 09/09/2023   Viral URI 05/20/2023   Primary osteoarthritis involving multiple joints 03/30/2023   Postmenopausal bleeding 03/30/2023   Chronic bilateral low back pain with sciatica 07/12/2022   Chronic pain syndrome 07/12/2022   Prediabetes 10/06/2021   Memory impairment 09/10/2021   History of epilepsy 09/10/2021   Vitamin D  deficiency 07/17/2020   Anxiety state 07/17/2020   Leukopenia  07/17/2020   Hyperlipidemia 09/18/2019   Acute pain of right knee 09/17/2019   Hip pain, acute, right 09/17/2019   Abnormal uterine bleeding (AUB) 02/13/2019   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   Tobacco use 04/04/2018   Heart palpitations 12/14/2016   Major depressive disorder 11/15/2016   Essential hypertension 11/15/2016   Fibroids 01/18/2012   Menorrhagia 01/18/2012   Seizure disorder (HCC) 01/18/2012   Anemia 01/18/2012    PCP: Delbert Clam, MD   REFERRING PROVIDER: Delbert Clam, MD   REFERRING DIAG:  M51.360 (ICD-10-CM) - Degeneration of intervertebral disc of lumbar region with discogenic back pain  M25.561,M25.562 (ICD-10-CM) - Pain in both knees, unspecified chronicity   Rationale for Evaluation and Treatment: Rehabilitation  THERAPY DIAG:  Chronic bilateral low back pain with sciatica, sciatica laterality unspecified - Plan: PT plan of care cert/re-cert  Muscle weakness (generalized) - Plan: PT plan of care cert/re-cert  Chronic pain of both knees - Plan: PT plan of care cert/re-cert  Other abnormalities of gait and mobility - Plan: PT plan of care cert/re-cert  ONSET DATE: Chronic  SUBJECTIVE:  SUBJECTIVE STATEMENT: Pt presents to PT with reports of chronic knee and LBP. Had a CVA a few years ago and this increased R sided weakness but otherwise no residual affect noted. Has a lot of pain laying supine and feels like her lower lumbar curve gets really exaggerated. Has some problems with dizziness with positional change. Occasionally has knee buckling but no falls recently. Wants to be able to walk and do stairs better with less pain.   PERTINENT HISTORY:  CVA, HTN, Seizures   PAIN:  Are you having pain?  Yes: NPRS scale: 8/10 Worst: 10/10 Pain  location: lower back Pain description: sharp, tight Aggravating factors: bending, lifting, squatting, lying flat Relieving factors: cold  Are you having pain?  Yes: NPRS scale: 7/10 Pain location: bilateral knees, R>L Pain description: sharp Aggravating factors: walking, stairs Relieving factors: rest  PRECAUTIONS: None  RED FLAGS: None   WEIGHT BEARING RESTRICTIONS: No  FALLS:  Has patient fallen in last 6 months? No  LIVING ENVIRONMENT: Lives with: lives with their family Lives in: House/apartment Stairs: Yes: External: 2 steps; on right going up Has following equipment at home: Vannie - 2 wheeled  OCCUPATION: Not working  PLOF: Independent  PATIENT GOALS: decrease back and knee pain, get improve comfort with standing and walking  NEXT MD VISIT: PRN  OBJECTIVE:  Note: Objective measures were completed at Evaluation unless otherwise noted.  DIAGNOSTIC FINDINGS:  See imaging   PATIENT SURVEYS:   LEFS: 10/80  COGNITION: Overall cognitive status: Within functional limits for tasks assessed     SENSATION: Light touch: Impaired - R posterior Le  MUSCLE LENGTH: Thomas test: Right (+); Left (+)  POSTURE: rounded shoulders, forward head, and increased lumbar lordosis  PALPATION: TTP to bilateral distal quad, bilateral lumbar paraspinals   LUMBAR ROM:   AROM eval  Flexion 25%   Extension 0%  Right lateral flexion   Left lateral flexion   Right rotation 25%  Left rotation 25%   (Blank rows = not tested)  LOWER EXTREMITY MMT:    MMT Right eval Left eval  Hip flexion 3+/5 3+/5  Hip extension    Hip abduction 3/5 3/5  Hip adduction    Hip internal rotation    Hip external rotation    Knee flexion 3+/5 3+/5  Knee extension 3+/5 3+/5  Ankle dorsiflexion    Ankle plantarflexion    Ankle inversion    Ankle eversion     (Blank rows = not tested)  LUMBAR SPECIAL TESTS:  Straight leg raise test: Negative and Slump test: Negative  FUNCTIONAL  TESTS:  30 Second Sit to Stand: 4 reps with UE  GAIT: Distance walked: 73ft Assistive device utilized: None Level of assistance: SBA Comments: antalgic gait R  VITALS: HR: 52-73bpm  TREATMENT: [facility] Adult PT Treatment:                                                DATE: 10/27/2023 Therapeutic Exercise: Seated clamshell x 5 GTB Seated march x 10 GTB LAQ x 5  PATIENT EDUCATION:  Education details: eval findings, LEFS, HEP, POC Person educated: Patient Education method: Explanation, Demonstration, and Handouts Education comprehension: verbalized understanding and returned demonstration  HOME EXERCISE PROGRAM: Access Code: [email or web address] URL: [email or web address] Date: 10/27/2023 Prepared by: Alm Kingdom  Exercises - Seated Hip Abduction with Resistance  - 1 x daily -  7 x weekly - 3 sets - 10 reps - green band hold - Seated March with Resistance  - 1 x daily - 7 x weekly - 3 sets - 10 reps - green band hold - Seated Long Arc Quad  - 1 x daily - 7 x weekly - 3 sets - 10 reps  ASSESSMENT:  CLINICAL IMPRESSION: Patient is a 53 y.o. F who was seen today for physical therapy evaluation and treatment for chronic bilateral knee and LBP. Physical findings are consistent with MD impression as pt demonstrates significant decrease in lumbar mobility, hip and core strength, and general functional mobility. She could not tolerate supine positioning due to pain and symptoms of dizziness. LEFS score shows severe disability in performance of home ADLs and community activity. Pt would benefit from skilled PT services working on improving core/LE strength and mobility in order to decrease pain and improve function.   OBJECTIVE IMPAIRMENTS: Abnormal gait, decreased activity tolerance, decreased balance, decreased mobility, difficulty walking, decreased ROM, decreased strength, improper body mechanics, and pain   ACTIVITY LIMITATIONS: carrying, lifting, sitting, standing, squatting,  stairs, transfers, bed mobility, and locomotion level  PARTICIPATION LIMITATIONS: driving, shopping, community activity, occupation, and yard work  PERSONAL FACTORS: Time since onset of injury/illness/exacerbation and 3+ comorbidities: CVA, HTN, Seizures  are also affecting patient's functional outcome.   REHAB POTENTIAL: Good  CLINICAL DECISION MAKING: Evolving/moderate complexity  EVALUATION COMPLEXITY: Low   GOALS: Goals reviewed with patient? No  SHORT TERM GOALS: Target date: 11/18/2023  Pt will be compliant and knowledgeable with initial HEP for improved comfort and carryover Baseline: initial HEP given  Goal status: INITIAL  2.  Pt will self report knee and low back pain no greater than 7/10 for improved comfort and functional ability Baseline: 10/10 at worst Goal status: INITIAL   LONG TERM GOALS: Target date: 12/23/2023   Pt will improve LEFS to no less than 30/80 as proxy for functional improvement with home ADLs and higher level community activity Baseline: 10/80 Goal status: INITIAL  2.  Pt will self report knee and low back pain no greater than 3/10 for improved comfort and functional ability Baseline: 10/10 at worst Goal status: INITIAL   3.  Pt will improve LE MMT at least 4/5 for improved functional mobility and decrease pain Baseline:  Goal status: INITIAL  4.  Pt will increase 30 Second Sit to Stand rep count to no less than 10 reps for improved balance, strength, and functional mobility Baseline: 4 reps with UE  Goal status: INITIAL    PLAN:  PT FREQUENCY: 1-2x/week  PT DURATION: 8 weeks  PLANNED INTERVENTIONS: 97164- PT Re-evaluation, 97110-Therapeutic exercises, 97530- Therapeutic activity, [id number]- Neuromuscular re-education, 97535- Self Care, [id number]- Manual therapy, [id number]- Gait training, [id number]- Aquatic Therapy, H9716- Electrical stimulation (unattended), [id number]- Electrical stimulation (manual), 97016- Vasopneumatic device, Cryotherapy, and Moist  heat.  PLAN FOR NEXT SESSION: core and LE strengthening, functional mobility training, hip flexor stretching    Alm JAYSON Kingdom, PT 10/28/2023, 12:27 PM

## 2023-11-02 ENCOUNTER — Other Ambulatory Visit: Payer: Self-pay

## 2023-11-02 ENCOUNTER — Encounter: Payer: Self-pay | Admitting: Physical Therapy

## 2023-11-08 ENCOUNTER — Telehealth (HOSPITAL_COMMUNITY): Payer: Self-pay | Admitting: Student

## 2023-11-08 ENCOUNTER — Other Ambulatory Visit: Payer: Self-pay

## 2023-11-08 NOTE — Telephone Encounter (Signed)
 Patient presented in person to pick up jury duty excuse letter. It was then faxed by front desk

## 2023-11-10 ENCOUNTER — Telehealth: Payer: Self-pay

## 2023-11-10 ENCOUNTER — Ambulatory Visit: Admitting: Pharmacist

## 2023-11-10 ENCOUNTER — Ambulatory Visit: Admitting: Podiatry

## 2023-11-10 NOTE — Telephone Encounter (Signed)
 Called [facility] to clarify pt and Rx. Was advised they do not have this pt in their system. Must have been an error with their system that printed Rx and this pt? DISREGARD this fax per pharmacy.

## 2023-11-12 ENCOUNTER — Ambulatory Visit (INDEPENDENT_AMBULATORY_CARE_PROVIDER_SITE_OTHER)

## 2023-11-12 ENCOUNTER — Encounter (HOSPITAL_COMMUNITY): Payer: Self-pay

## 2023-11-12 ENCOUNTER — Ambulatory Visit (HOSPITAL_COMMUNITY)
Admission: EM | Admit: 2023-11-12 | Discharge: 2023-11-12 | Disposition: A | Discharge status: Home / Self Care | Attending: Physician Assistant | Admitting: Physician Assistant

## 2023-11-12 DIAGNOSIS — R000 Tachycardia, unspecified: Secondary | ICD-10-CM

## 2023-11-12 DIAGNOSIS — J40 Bronchitis, not specified as acute or chronic: Secondary | ICD-10-CM | POA: Diagnosis not present

## 2023-11-12 DIAGNOSIS — R051 Acute cough: Secondary | ICD-10-CM | POA: Diagnosis not present

## 2023-11-12 DIAGNOSIS — J329 Chronic sinusitis, unspecified: Secondary | ICD-10-CM

## 2023-11-12 LAB — POC COVID19/FLU A&B COMBO
Covid Antigen, POC: NEGATIVE
Influenza A Antigen, POC: NEGATIVE
Influenza B Antigen, POC: NEGATIVE

## 2023-11-12 MED ORDER — AEROCHAMBER PLUS FLO-VU LARGE MISC
1.0000 | Freq: Once | Status: AC
  Administered 2023-11-12: 1

## 2023-11-12 MED ORDER — ALBUTEROL SULFATE HFA 108 (90 BASE) MCG/ACT IN AERS
INHALATION_SPRAY | RESPIRATORY_TRACT | Status: AC
  Filled 2023-11-12: qty 6.7

## 2023-11-12 MED ORDER — ALBUTEROL SULFATE HFA 108 (90 BASE) MCG/ACT IN AERS
2.0000 | INHALATION_SPRAY | Freq: Once | RESPIRATORY_TRACT | Status: AC
  Administered 2023-11-12: 2 via RESPIRATORY_TRACT

## 2023-11-12 MED ORDER — AEROCHAMBER PLUS FLO-VU LARGE MISC
Status: AC
  Filled 2023-11-12: qty 1

## 2023-11-12 MED ORDER — ALBUTEROL SULFATE (2.5 MG/3ML) 0.083% IN NEBU
INHALATION_SOLUTION | RESPIRATORY_TRACT | Status: AC
  Filled 2023-11-12: qty 3

## 2023-11-12 MED ORDER — PREDNISONE 20 MG PO TABS: 40.0000 mg | ORAL_TABLET | Freq: Every day | ORAL | 0 refills | Status: AC

## 2023-11-12 MED ORDER — DOXYCYCLINE HYCLATE 100 MG PO CAPS
100.0000 mg | ORAL_CAPSULE | Freq: Two times a day (BID) | ORAL | 0 refills | Status: DC
Start: 2023-11-12 — End: 2024-02-10

## 2023-11-12 MED ORDER — BENZONATATE 100 MG PO CAPS
100.0000 mg | ORAL_CAPSULE | Freq: Three times a day (TID) | ORAL | 0 refills | Status: AC
Start: 2023-11-12 — End: ?

## 2023-11-12 NOTE — ED Provider Notes (Signed)
 [facility]    CSN: [id number] Arrival date & time: 11/12/23  1106      History   Chief Complaint Chief Complaint  Patient presents with   Cough   Wheezing   Generalized Body Aches   Headache    HPI Tina Moss is a 53 y.o. female.   Patient presents today with a 2-day history of URI symptoms.  She reports associated cough, wheezing, nasal congestion, body aches, headache.  Denies any fever, chest pain, shortness of breath but has had some wheezing.  She denies any known sick contacts.  She has not been taking any over-the-counter medication for symptom management.  She has not had COVID recently.  She has never taken the COVID-19 vaccinations.  She denies history of allergies, asthma, COPD, smoking.  Denies history of diabetes.  Denies any recent antibiotics or steroids.  She is having difficulty with daily activities as she feels reportedly and has widespread body aches.    Past Medical History:  Diagnosis Date   Anemia    CVA (cerebral vascular accident) (HCC)    2020   Epilepsy (HCC)    Fibroids    H/O bacterial infection    H/O mumps    Hyperlipidemia    Hypertension    Seizures (HCC)     Patient Active Problem List   Diagnosis Date Noted   PTSD (post-traumatic stress disorder) 09/29/2023   PVC (premature ventricular contraction) 09/09/2023   Viral URI 05/20/2023   Primary osteoarthritis involving multiple joints 03/30/2023   Postmenopausal bleeding 03/30/2023   Chronic bilateral low back pain with sciatica 07/12/2022   Chronic pain syndrome 07/12/2022   Prediabetes 10/06/2021   Memory impairment 09/10/2021   History of epilepsy 09/10/2021   Vitamin D  deficiency 07/17/2020   Anxiety state 07/17/2020   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/2020   Hyperlipidemia 09/18/2019   Acute pain of right knee 09/17/2019   Hip pain, acute, right 09/17/2019   Abnormal uterine bleeding (AUB) 02/13/2019   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   Tobacco  use 04/04/2018   Heart palpitations 12/14/2016   Major depressive disorder 11/15/2016   Essential hypertension 11/15/2016   Fibroids 01/18/2012   Menorrhagia 01/18/2012   Seizure disorder (HCC) 01/18/2012   Anemia 01/18/2012    Past Surgical History:  Procedure Laterality Date   CESAREAN SECTION     x2. at term   TEE WITHOUT CARDIOVERSION N/A 04/06/2018   Procedure: TRANSESOPHAGEAL ECHOCARDIOGRAM (TEE) BUBBLE STUDY;  Surgeon: Lenise Quince, MD;  Location: [facility] ENDOSCOPY;  Service: Cardiovascular;  Laterality: N/A;   TUBAL LIGATION     WISDOM TOOTH EXTRACTION      OB History     Gravida  3   Para  2   Term  2   Preterm      AB  1   Living  2      SAB  1   IAB      Ectopic      Multiple      Live Births  2        Obstetric Comments  Term c/s x 2.           Home Medications    Prior to Admission medications   Medication Sig Start Date End Date Taking? Authorizing Provider  benzonatate  (TESSALON ) 100 MG capsule Take 1 capsule (100 mg total) by mouth every 8 (eight) hours. 11/12/23  Yes Omeka Holben K, PA-C  doxycycline  (VIBRAMYCIN ) 100 MG capsule Take  1 capsule (100 mg total) by mouth 2 (two) times daily. 11/12/23  Yes Tasharra Nodine K, PA-C  predniSONE  (DELTASONE ) 20 MG tablet Take 2 tablets (40 mg total) by mouth daily for 5 days. 11/12/23 11/17/23 Yes Dazaria Macneill K, PA-C  albuterol  (VENTOLIN  HFA) 108 (90 Base) MCG/ACT inhaler Inhale 1-2 puffs into the lungs every 6 (six) hours as needed for wheezing or shortness of breath. 07/13/23   Harlow Lighter, [place]  N, FNP  atorvastatin  (LIPITOR) 80 MG tablet Take 1 tablet (80 mg total) by mouth daily. 09/27/23   Newlin, Enobong, MD  cetirizine  (ZYRTEC ) 10 MG tablet Take 1 tablet (10 mg total) by mouth daily. 09/27/23   Newlin, Enobong, MD  chlorthalidone  (HYGROTON ) 25 MG tablet Take 1 tablet (25 mg total) by mouth daily. 09/27/23   Newlin, Enobong, MD  clopidogrel  (PLAVIX ) 75 MG tablet Take 1 tablet (75 mg total) by mouth  daily. 09/27/23   Newlin, Enobong, MD  diclofenac  Sodium (VOLTAREN ) 1 % GEL Apply 2 g topically 4 (four) times daily. 03/30/23   Sheree Dieter, MD  donepezil  (ARICEPT ) 10 MG tablet Take 1 tablet (10 mg total) by mouth daily. 09/27/23   Newlin, Enobong, MD  FLUoxetine  (PROZAC ) 20 MG capsule Take 1 capsule (20 mg total) by mouth daily. 09/27/23   Newlin, Enobong, MD  furosemide  (LASIX ) 20 MG tablet Take 1 tablet (20 mg total) by mouth daily. 09/27/23   Newlin, Enobong, MD  losartan  (COZAAR ) 50 MG tablet Take 1 tablet (50 mg total) by mouth daily. 09/27/23   Newlin, Enobong, MD  meloxicam  (MOBIC ) 7.5 MG tablet Take 1 tablet (7.5 mg total) by mouth daily. 10/06/23   Wilhelmenia Harada, MD  metoprolol  tartrate (LOPRESSOR ) 50 MG tablet Take 1 tablet (50 mg total) by mouth 2 (two) times daily. 09/27/23   Newlin, Enobong, MD  oxybutynin  (DITROPAN  XL) 5 MG 24 hr tablet Take 1 tablet (5 mg total) by mouth at bedtime. For overactive bladder 09/27/23   Joaquin Mulberry, MD  prazosin  (MINIPRESS ) 1 MG capsule Take 1 capsule (1 mg total) by mouth at bedtime. 09/29/23 11/28/23  Nguyen, Julie, DO  tiZANidine  (ZANAFLEX ) 4 MG tablet Take 1 tablet (4 mg total) by mouth every 8 (eight) hours as needed. 08/24/23   Joaquin Mulberry, MD    Family History Family History  Problem Relation Age of Onset   Hypertension Mother    Hypertension Sister    Arthritis Sister        knees   Mental illness Daughter        ? bipolar   Hypertension Brother    Deafness Brother    Speech disorder Brother        mute   Mental illness Brother        "he can just fly off"   Scoliosis Son    Breast cancer Neg Hx     Social History Social History   Tobacco Use   Smoking status: Former    Types: Cigars   Smokeless tobacco: Never   Tobacco comments:    "I think I just keep so much on my mind."  Vaping Use   Vaping status: Never Used  Substance Use Topics   Alcohol use: Not Currently    Alcohol/week: 0.0 - 3.0 standard drinks of  alcohol    Comment: Stopped   Drug use: Not Currently    Types: Marijuana    Comment: Stopped     Allergies   Patient has no known allergies.   Review  of Systems Review of Systems  Constitutional:  Positive for activity change and fatigue. Negative for appetite change and fever.  HENT:  Positive for congestion and sore throat. Negative for sinus pressure and sneezing.   Respiratory:  Positive for cough. Negative for shortness of breath.   Cardiovascular:  Negative for chest pain.  Gastrointestinal:  Negative for abdominal pain, diarrhea, nausea and vomiting.  Musculoskeletal:  Positive for arthralgias and myalgias.  Neurological:  Positive for headaches. Negative for dizziness and light-headedness.     Physical Exam Triage Vital Signs ED Triage Vitals [11/12/23 1155]  Encounter Vitals Group     BP 137/87     Systolic BP Percentile      Diastolic BP Percentile      Pulse Rate (!) 105     Resp 16     Temp 98.8 F (37.1 C)     Temp Source Oral     SpO2 92 %     Weight      Height      Head Circumference      Peak Flow      Pain Score      Pain Loc      Pain Education      Exclude from Growth Chart    No data found.  Updated Vital Signs BP 132/89 (BP Location: Left Arm)   Pulse (!) 114   Temp 99 F (37.2 C) (Oral)   Resp 18   LMP  (LMP Unknown) Comment: more than a year since last menstrual  SpO2 92%   Visual Acuity Right Eye Distance:   Left Eye Distance:   Bilateral Distance:    Right Eye Near:   Left Eye Near:    Bilateral Near:     Physical Exam Vitals reviewed.  Constitutional:      General: She is awake. She is not in acute distress.    Appearance: Normal appearance. She is well-developed. She is not ill-appearing.     Comments: Very pleasant female appears stated age in no acute distress sitting comfortably in exam room  HENT:     Head: Normocephalic and atraumatic.     Right Ear: Tympanic membrane, ear canal and external ear normal.  Tympanic membrane is not erythematous or bulging.     Left Ear: Tympanic membrane, ear canal and external ear normal. Tympanic membrane is not erythematous or bulging.     Nose:     Right Sinus: Maxillary sinus tenderness present. No frontal sinus tenderness.     Left Sinus: Maxillary sinus tenderness present. No frontal sinus tenderness.     Mouth/Throat:     Pharynx: Uvula midline. Postnasal drip present. No oropharyngeal exudate or posterior oropharyngeal erythema.  Cardiovascular:     Rate and Rhythm: Normal rate and regular rhythm.     Heart sounds: Normal heart sounds, S1 normal and S2 normal. No murmur heard. Pulmonary:     Effort: Pulmonary effort is normal.     Breath sounds: Examination of the right-lower field reveals decreased breath sounds. Examination of the left-lower field reveals decreased breath sounds. Decreased breath sounds and wheezing present. No rhonchi or rales.     Comments: Decreased aeration of bilateral bases with scattered wheezing. Psychiatric:        Behavior: Behavior is cooperative.      UC Treatments / Results  Labs (all labs ordered are listed, but only abnormal results are displayed) Labs Reviewed  POC COVID19/FLU A&B COMBO    EKG  Radiology DG Chest 2 View Result Date: 11/12/2023 CLINICAL DATA:  Worsening cough EXAM: CHEST - 2 VIEW COMPARISON:  Chest x-ray performed July 13, 2023 FINDINGS: Elevated left hemidiaphragm, similar. Mild peribronchial thickening is similar when compared to the previous exam. A discrete lobar infiltrate is not appreciated. No pleural effusion or pneumothorax. IMPRESSION: 1. Mild peribronchial thickening which can be observed in the setting of atypical infectious and inflammatory etiologies. 2. A discrete lobar infiltrate is not seen. 3. Chronic elevation of left hemidiaphragm. Electronically Signed   By: Reagan Camera M.D.   On: 11/12/2023 12:50    Procedures Procedures (including critical care  time)  Medications Ordered in UC Medications  albuterol  (VENTOLIN  HFA) 108 (90 Base) MCG/ACT inhaler 2 puff (2 puffs Inhalation Given 11/12/23 1225)  AeroChamber Plus Flo-Vu Large MISC 1 each (1 each Other Given 11/12/23 1226)    Initial Impression / Assessment and Plan / UC Course  I have reviewed the triage vital signs and the nursing notes.  Pertinent labs & imaging results that were available during my care of the patient were reviewed by me and considered in my medical decision making (see chart for details).     Patient is well-appearing, afebrile, nontoxic.  She was tachycardic on exam and EKG was obtained that showed sinus tachycardia with ventricular rate of 105 bpm without ischemic changes; compared to 08/24/2023 tracing patient has severe PVC but otherwise no significant change.  Recommend that she follow-up closely with her primary care for further evaluation and management to consider referral to cardiology.  Concern for viral infection given her short duration of symptoms but she tested negative for COVID and flu.  She was given albuterol  with improvement of symptoms and so sent home with this medication to be used every 4-6 hours as needed.  Her chest x-ray was obtained that showed concerns for reactive airway disease versus atypical infection.  Given her worsening symptoms will cover for both conditions and she was started on doxycycline  for atypical pneumonia with instruction to avoid prolonged sun exposure while on this medication.  She can use over-the-counter medication including recent, Flonase , Tylenol  for symptom relief.  She was given Tessalon  for cough.  Will also start prednisone  burst of 40 mg for 5 days and we discussed that she is not to take NSAIDs with this medication.  Recommended close follow-up with her primary care ideally for Singh next week.  We discussed that if anything worsens or changes she would need to go to the emergency room for further evaluation and  management including chest pain, shortness of breath, palpitations, weakness, lightheadedness.  Strict return precautions given.  Excuse note provided.  All questions answered to patient satisfaction.  Final Clinical Impressions(s) / UC Diagnoses   Final diagnoses:  Acute cough  Sinobronchitis     Discharge Instructions      You are negative for COVID and flu.  Your chest x-ray did show evidence of a possible infection so we are starting doxycycline  100 mg twice daily for 10 days.  Stay out of the sun while on this medication.  I would also like you to start prednisone  40 mg for 5 days.  Do not take NSAIDs with this medication including aspirin , ibuprofen /Advil , naproxen/Aleve.  Use the albuterol  every 4-6 hours as needed.  Use over-the-counter medications including Mucinex, Flonase , Tylenol , nasal saline/sinus rinses for additional symptom relief.  Make sure you rest and drink plenty fluid.  If you are not feeling better within 3 to 5 days  or if anything worsens and you have high fever, worsening cough, shortness of breath, chest pain you need to be seen immediately.   ED Prescriptions     Medication Sig Dispense Auth. Provider   doxycycline  (VIBRAMYCIN ) 100 MG capsule Take 1 capsule (100 mg total) by mouth 2 (two) times daily. 20 capsule Aengus Sauceda K, PA-C   predniSONE  (DELTASONE ) 20 MG tablet Take 2 tablets (40 mg total) by mouth daily for 5 days. 10 tablet Deaken Jurgens K, PA-C   benzonatate  (TESSALON ) 100 MG capsule Take 1 capsule (100 mg total) by mouth every 8 (eight) hours. 21 capsule Colette Dicamillo K, PA-C      PDMP not reviewed this encounter.   Budd Cargo, PA-C 11/12/23 1353

## 2023-11-12 NOTE — ED Triage Notes (Signed)
 Patient c/o a productive cough with green blood -tinged sputum, wheezing, nasal congestion with green mucus, body aches, and a headache x 3 days.  Patient denies taking any medication for her symptoms.

## 2023-11-12 NOTE — Discharge Instructions (Signed)
 You are negative for COVID and flu.  Your chest x-ray did show evidence of a possible infection so we are starting doxycycline  100 mg twice daily for 10 days.  Stay out of the sun while on this medication.  I would also like you to start prednisone  40 mg for 5 days.  Do not take NSAIDs with this medication including aspirin , ibuprofen /Advil , naproxen/Aleve.  Use the albuterol  every 4-6 hours as needed.  Use over-the-counter medications including Mucinex, Flonase , Tylenol , nasal saline/sinus rinses for additional symptom relief.  Make sure you rest and drink plenty fluid.  If you are not feeling better within 3 to 5 days or if anything worsens and you have high fever, worsening cough, shortness of breath, chest pain you need to be seen immediately.

## 2023-11-14 ENCOUNTER — Other Ambulatory Visit: Payer: Self-pay

## 2023-11-14 ENCOUNTER — Other Ambulatory Visit: Payer: Self-pay | Admitting: Orthopaedic Surgery

## 2023-11-14 ENCOUNTER — Ambulatory Visit: Attending: Family Medicine

## 2023-11-14 MED ORDER — MELOXICAM 7.5 MG PO TABS
7.5000 mg | ORAL_TABLET | Freq: Every day | ORAL | 0 refills | Status: DC
  Filled 2023-11-24 – 2023-12-06 (×3): qty 30, 30d supply, fill #0

## 2023-11-14 NOTE — Therapy (Incomplete)
 OUTPATIENT PHYSICAL THERAPY TREATMENT   Patient Name: JENIFER STRUVE MRN: [id number] DOB:February 17, 1971, 53 y.o., female Today's Date: 11/14/2023  END OF SESSION:    Past Medical History:  Diagnosis Date   Anemia    CVA (cerebral vascular accident) (HCC)    2020   Epilepsy (HCC)    Fibroids    H/O bacterial infection    H/O mumps    Hyperlipidemia    Hypertension    Seizures (HCC)    Past Surgical History:  Procedure Laterality Date   CESAREAN SECTION     x2. at term   TEE WITHOUT CARDIOVERSION N/A 04/06/2018   Procedure: TRANSESOPHAGEAL ECHOCARDIOGRAM (TEE) BUBBLE STUDY;  Surgeon: Lenise Quince, MD;  Location: [facility] ENDOSCOPY;  Service: Cardiovascular;  Laterality: N/A;   TUBAL LIGATION     WISDOM TOOTH EXTRACTION     Patient Active Problem List   Diagnosis Date Noted   PTSD (post-traumatic stress disorder) 09/29/2023   PVC (premature ventricular contraction) 09/09/2023   Viral URI 05/20/2023   Primary osteoarthritis involving multiple joints 03/30/2023   Postmenopausal bleeding 03/30/2023   Chronic bilateral low back pain with sciatica 07/12/2022   Chronic pain syndrome 07/12/2022   Prediabetes 10/06/2021   Memory impairment 09/10/2021   History of epilepsy 09/10/2021   Vitamin D  deficiency 07/17/2020   Anxiety state 07/17/2020   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/2020   Hyperlipidemia 09/18/2019   Acute pain of right knee 09/17/2019   Hip pain, acute, right 09/17/2019   Abnormal uterine bleeding (AUB) 02/13/2019   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   Tobacco use 04/04/2018   Heart palpitations 12/14/2016   Major depressive disorder 11/15/2016   Essential hypertension 11/15/2016   Fibroids 01/18/2012   Menorrhagia 01/18/2012   Seizure disorder (HCC) 01/18/2012   Anemia 01/18/2012    PCP: Joaquin Mulberry, MD   REFERRING PROVIDER: Joaquin Mulberry, MD   REFERRING DIAG:  M51.360 (ICD-10-CM) - Degeneration of intervertebral disc of lumbar region with  discogenic back pain  M25.561,M25.562 (ICD-10-CM) - Pain in both knees, unspecified chronicity   Rationale for Evaluation and Treatment: Rehabilitation  THERAPY DIAG:  No diagnosis found.  ONSET DATE: Chronic  SUBJECTIVE:                                                                                                                                                                                           SUBJECTIVE STATEMENT: ***   EVAL: Pt presents to PT with reports of chronic knee and LBP. Had a CVA a few years ago and this increased R sided weakness but otherwise no residual affect noted. Has a lot of  pain laying supine and feels like her lower lumbar curve gets really exaggerated. Has some problems with dizziness with positional change. Occasionally has knee buckling but no falls recently. Wants to be able to walk and do stairs better with less pain.   PERTINENT HISTORY:  CVA, HTN, Seizures   PAIN:  Are you having pain?  Yes: NPRS scale: 8/10 Worst: 10/10 Pain location: lower back Pain description: sharp, tight Aggravating factors: bending, lifting, squatting, lying flat Relieving factors: cold  Are you having pain?  Yes: NPRS scale: 7/10 Pain location: bilateral knees, R>L Pain description: sharp Aggravating factors: walking, stairs Relieving factors: rest  PRECAUTIONS: None  RED FLAGS: None   WEIGHT BEARING RESTRICTIONS: No  FALLS:  Has patient fallen in last 6 months? No  LIVING ENVIRONMENT: Lives with: lives with their family Lives in: House/apartment Stairs: Yes: External: 2 steps; on right going up Has following equipment at home: Otho Blitz - 2 wheeled  OCCUPATION: Not working  PLOF: Independent  PATIENT GOALS: decrease back and knee pain, get improve comfort with standing and walking  NEXT MD VISIT: PRN  OBJECTIVE:  Note: Objective measures were completed at Evaluation unless otherwise noted.  DIAGNOSTIC FINDINGS:  See imaging   PATIENT  SURVEYS:   LEFS: 10/80  COGNITION: Overall cognitive status: Within functional limits for tasks assessed     SENSATION: Light touch: Impaired - R posterior Le  MUSCLE LENGTH: Thomas test: Right (+); Left (+)  POSTURE: rounded shoulders, forward head, and increased lumbar lordosis  PALPATION: TTP to bilateral distal quad, bilateral lumbar paraspinals   LUMBAR ROM:   AROM eval  Flexion 25%   Extension 0%  Right lateral flexion   Left lateral flexion   Right rotation 25%  Left rotation 25%   (Blank rows = not tested)  LOWER EXTREMITY MMT:    MMT Right eval Left eval  Hip flexion 3+/5 3+/5  Hip extension    Hip abduction 3/5 3/5  Hip adduction    Hip internal rotation    Hip external rotation    Knee flexion 3+/5 3+/5  Knee extension 3+/5 3+/5  Ankle dorsiflexion    Ankle plantarflexion    Ankle inversion    Ankle eversion     (Blank rows = not tested)  LUMBAR SPECIAL TESTS:  Straight leg raise test: Negative and Slump test: Negative  FUNCTIONAL TESTS:  30 Second Sit to Stand: 4 reps with UE  GAIT: Distance walked: 16ft Assistive device utilized: None Level of assistance: SBA Comments: antalgic gait R  VITALS: HR: 52-73bpm  TREATMENT: [facility] Adult PT Treatment:                                                DATE: 11/14/2023 Therapeutic Exercise: Seated clamshell x 5 GTB Seated march x 10 GTB LAQ x 5  [facility] Adult PT Treatment:                                                DATE: 10/27/2023 Therapeutic Exercise: Seated clamshell x 5 GTB Seated march x 10 GTB LAQ x 5  PATIENT EDUCATION:  Education details: eval findings, LEFS, HEP, POC Person educated: Patient Education method: Explanation, Demonstration, and Handouts Education comprehension: verbalized  understanding and returned demonstration  HOME EXERCISE PROGRAM: Access Code: [email or web address] URL: [email or web address] Date: 10/27/2023 Prepared by: Loral Roch  Exercises - Seated  Hip Abduction with Resistance  - 1 x daily - 7 x weekly - 3 sets - 10 reps - green band hold - Seated March with Resistance  - 1 x daily - 7 x weekly - 3 sets - 10 reps - green band hold - Seated Long Arc Quad  - 1 x daily - 7 x weekly - 3 sets - 10 reps  ASSESSMENT:  CLINICAL IMPRESSION: ***  EVAL: Patient is a 53 y.o. F who was seen today for physical therapy evaluation and treatment for chronic bilateral knee and LBP. Physical findings are consistent with MD impression as pt demonstrates significant decrease in lumbar mobility, hip and core strength, and general functional mobility. She could not tolerate supine positioning due to pain and symptoms of dizziness. LEFS score shows severe disability in performance of home ADLs and community activity. Pt would benefit from skilled PT services working on improving core/LE strength and mobility in order to decrease pain and improve function.   OBJECTIVE IMPAIRMENTS: Abnormal gait, decreased activity tolerance, decreased balance, decreased mobility, difficulty walking, decreased ROM, decreased strength, improper body mechanics, and pain   ACTIVITY LIMITATIONS: carrying, lifting, sitting, standing, squatting, stairs, transfers, bed mobility, and locomotion level  PARTICIPATION LIMITATIONS: driving, shopping, community activity, occupation, and yard work  PERSONAL FACTORS: Time since onset of injury/illness/exacerbation and 3+ comorbidities: CVA, HTN, Seizures  are also affecting patient's functional outcome.   REHAB POTENTIAL: Good  CLINICAL DECISION MAKING: Evolving/moderate complexity  EVALUATION COMPLEXITY: Low   GOALS: Goals reviewed with patient? No  SHORT TERM GOALS: Target date: 11/18/2023  Pt will be compliant and knowledgeable with initial HEP for improved comfort and carryover Baseline: initial HEP given  Goal status: INITIAL  2.  Pt will self report knee and low back pain no greater than 7/10 for improved comfort and functional  ability Baseline: 10/10 at worst Goal status: INITIAL   LONG TERM GOALS: Target date: 12/23/2023   Pt will improve LEFS to no less than 30/80 as proxy for functional improvement with home ADLs and higher level community activity Baseline: 10/80 Goal status: INITIAL  2.  Pt will self report knee and low back pain no greater than 3/10 for improved comfort and functional ability Baseline: 10/10 at worst Goal status: INITIAL   3.  Pt will improve LE MMT at least 4/5 for improved functional mobility and decrease pain Baseline:  Goal status: INITIAL  4.  Pt will increase 30 Second Sit to Stand rep count to no less than 10 reps for improved balance, strength, and functional mobility Baseline: 4 reps with UE  Goal status: INITIAL    PLAN:  PT FREQUENCY: 1-2x/week  PT DURATION: 8 weeks  PLANNED INTERVENTIONS: [id number]- PT Re-evaluation, 97110-Therapeutic exercises, 97530- Therapeutic activity, [id number]- Neuromuscular re-education, 97535- Self Care, [id number]- Manual therapy, [id number]- Gait training, [id number]- Aquatic Therapy, [id number]- Electrical stimulation (unattended), [id number]- Electrical stimulation (manual), 97016- Vasopneumatic device, Cryotherapy, and Moist heat.  PLAN FOR NEXT SESSION: core and LE strengthening, functional mobility training, hip flexor stretching    Ivor Mars, PT 11/14/2023, 7:58 AM

## 2023-11-15 ENCOUNTER — Ambulatory Visit (HOSPITAL_COMMUNITY): Admitting: Clinical

## 2023-11-15 ENCOUNTER — Telehealth: Payer: Self-pay

## 2023-11-15 ENCOUNTER — Encounter (HOSPITAL_COMMUNITY): Payer: Self-pay

## 2023-11-15 NOTE — Telephone Encounter (Signed)
 PT called and left voicemail to patient regarding missed visit on 11/14/2023. Reminded her of next appointment and attendance policy.   Ivor Mars   11/15/23 3:34 PM

## 2023-11-21 ENCOUNTER — Ambulatory Visit

## 2023-11-21 ENCOUNTER — Other Ambulatory Visit: Payer: Self-pay | Admitting: Family Medicine

## 2023-11-21 NOTE — Therapy (Incomplete)
 OUTPATIENT PHYSICAL THERAPY TREATMENT   Patient Name: Tina Moss MRN: [id number] DOB:[birth year], 53 y.o., female Today's Date: 11/21/2023  END OF SESSION:    Past Medical History:  Diagnosis Date   Anemia    CVA (cerebral vascular accident) (HCC)    2020   Epilepsy (HCC)    Fibroids    H/O bacterial infection    H/O mumps    Hyperlipidemia    Hypertension    Seizures (HCC)    Past Surgical History:  Procedure Laterality Date   CESAREAN SECTION     x2. at term   TEE WITHOUT CARDIOVERSION N/A 04/06/2018   Procedure: TRANSESOPHAGEAL ECHOCARDIOGRAM (TEE) BUBBLE STUDY;  Surgeon: Lenise Quince, MD;  Location: [facility] ENDOSCOPY;  Service: Cardiovascular;  Laterality: N/A;   TUBAL LIGATION     WISDOM TOOTH EXTRACTION     Patient Active Problem List   Diagnosis Date Noted   PTSD (post-traumatic stress disorder) 09/29/2023   PVC (premature ventricular contraction) 09/09/2023   Viral URI 05/20/2023   Primary osteoarthritis involving multiple joints 03/30/2023   Postmenopausal bleeding 03/30/2023   Chronic bilateral low back pain with sciatica 07/12/2022   Chronic pain syndrome 07/12/2022   Prediabetes 10/06/2021   Memory impairment 09/10/2021   History of epilepsy 09/10/2021   Vitamin D  deficiency 07/17/2020   Anxiety state 07/17/2020   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/2020   Hyperlipidemia 09/18/2019   Acute pain of right knee 09/17/2019   Hip pain, acute, right 09/17/2019   Abnormal uterine bleeding (AUB) 02/13/2019   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   Tobacco use 04/04/2018   Heart palpitations 12/14/2016   Major depressive disorder 11/15/2016   Essential hypertension 11/15/2016   Fibroids 01/18/2012   Menorrhagia 01/18/2012   Seizure disorder (HCC) 01/18/2012   Anemia 01/18/2012    PCP: Joaquin Mulberry, MD   REFERRING PROVIDER: Joaquin Mulberry, MD   REFERRING DIAG:  M51.360 (ICD-10-CM) - Degeneration of intervertebral disc of lumbar region with  discogenic back pain  M25.561,M25.562 (ICD-10-CM) - Pain in both knees, unspecified chronicity   Rationale for Evaluation and Treatment: Rehabilitation  THERAPY DIAG:  No diagnosis found.  ONSET DATE: Chronic  SUBJECTIVE:                                                                                                                                                                                           SUBJECTIVE STATEMENT: ***   EVAL: Pt presents to PT with reports of chronic knee and LBP. Had a CVA a few years ago and this increased R sided weakness but otherwise no residual affect noted. Has a lot of  pain laying supine and feels like her lower lumbar curve gets really exaggerated. Has some problems with dizziness with positional change. Occasionally has knee buckling but no falls recently. Wants to be able to walk and do stairs better with less pain.   PERTINENT HISTORY:  CVA, HTN, Seizures   PAIN:  Are you having pain?  Yes: NPRS scale: 8/10 Worst: 10/10 Pain location: lower back Pain description: sharp, tight Aggravating factors: bending, lifting, squatting, lying flat Relieving factors: cold  Are you having pain?  Yes: NPRS scale: 7/10 Pain location: bilateral knees, R>L Pain description: sharp Aggravating factors: walking, stairs Relieving factors: rest  PRECAUTIONS: None  RED FLAGS: None   WEIGHT BEARING RESTRICTIONS: No  FALLS:  Has patient fallen in last 6 months? No  LIVING ENVIRONMENT: Lives with: lives with their family Lives in: House/apartment Stairs: Yes: External: 2 steps; on right going up Has following equipment at home: Otho Blitz - 2 wheeled  OCCUPATION: Not working  PLOF: Independent  PATIENT GOALS: decrease back and knee pain, get improve comfort with standing and walking  NEXT MD VISIT: PRN  OBJECTIVE:  Note: Objective measures were completed at Evaluation unless otherwise noted.  DIAGNOSTIC FINDINGS:  See imaging   PATIENT  SURVEYS:   LEFS: 10/80  COGNITION: Overall cognitive status: Within functional limits for tasks assessed     SENSATION: Light touch: Impaired - R posterior Le  MUSCLE LENGTH: Thomas test: Right (+); Left (+)  POSTURE: rounded shoulders, forward head, and increased lumbar lordosis  PALPATION: TTP to bilateral distal quad, bilateral lumbar paraspinals   LUMBAR ROM:   AROM eval  Flexion 25%   Extension 0%  Right lateral flexion   Left lateral flexion   Right rotation 25%  Left rotation 25%   (Blank rows = not tested)  LOWER EXTREMITY MMT:    MMT Right eval Left eval  Hip flexion 3+/5 3+/5  Hip extension    Hip abduction 3/5 3/5  Hip adduction    Hip internal rotation    Hip external rotation    Knee flexion 3+/5 3+/5  Knee extension 3+/5 3+/5  Ankle dorsiflexion    Ankle plantarflexion    Ankle inversion    Ankle eversion     (Blank rows = not tested)  LUMBAR SPECIAL TESTS:  Straight leg raise test: Negative and Slump test: Negative  FUNCTIONAL TESTS:  30 Second Sit to Stand: 4 reps with UE  GAIT: Distance walked: 67ft Assistive device utilized: None Level of assistance: SBA Comments: antalgic gait R  VITALS: HR: 52-73bpm  TREATMENT: [facility] Adult PT Treatment:                                                DATE: 11/21/2023 Therapeutic Exercise: Seated clamshell x 5 GTB Seated march x 10 GTB LAQ x 5  [facility] Adult PT Treatment:                                                DATE: 10/27/2023 Therapeutic Exercise: Seated clamshell x 5 GTB Seated march x 10 GTB LAQ x 5  PATIENT EDUCATION:  Education details: eval findings, LEFS, HEP, POC Person educated: Patient Education method: Explanation, Demonstration, and Handouts Education comprehension: verbalized  understanding and returned demonstration  HOME EXERCISE PROGRAM: Access Code: [email or web address] URL: [email or web address] Date: 10/27/2023 Prepared by: Loral Roch  Exercises - Seated  Hip Abduction with Resistance  - 1 x daily - 7 x weekly - 3 sets - 10 reps - green band hold - Seated March with Resistance  - 1 x daily - 7 x weekly - 3 sets - 10 reps - green band hold - Seated Long Arc Quad  - 1 x daily - 7 x weekly - 3 sets - 10 reps  ASSESSMENT:  CLINICAL IMPRESSION: ***  EVAL: Patient is a 53 y.o. F who was seen today for physical therapy evaluation and treatment for chronic bilateral knee and LBP. Physical findings are consistent with MD impression as pt demonstrates significant decrease in lumbar mobility, hip and core strength, and general functional mobility. She could not tolerate supine positioning due to pain and symptoms of dizziness. LEFS score shows severe disability in performance of home ADLs and community activity. Pt would benefit from skilled PT services working on improving core/LE strength and mobility in order to decrease pain and improve function.   OBJECTIVE IMPAIRMENTS: Abnormal gait, decreased activity tolerance, decreased balance, decreased mobility, difficulty walking, decreased ROM, decreased strength, improper body mechanics, and pain   ACTIVITY LIMITATIONS: carrying, lifting, sitting, standing, squatting, stairs, transfers, bed mobility, and locomotion level  PARTICIPATION LIMITATIONS: driving, shopping, community activity, occupation, and yard work  PERSONAL FACTORS: Time since onset of injury/illness/exacerbation and 3+ comorbidities: CVA, HTN, Seizures  are also affecting patient's functional outcome.   REHAB POTENTIAL: Good  CLINICAL DECISION MAKING: Evolving/moderate complexity  EVALUATION COMPLEXITY: Low   GOALS: Goals reviewed with patient? No  SHORT TERM GOALS: Target date: 11/18/2023  Pt will be compliant and knowledgeable with initial HEP for improved comfort and carryover Baseline: initial HEP given  Goal status: INITIAL  2.  Pt will self report knee and low back pain no greater than 7/10 for improved comfort and functional  ability Baseline: 10/10 at worst Goal status: INITIAL   LONG TERM GOALS: Target date: 12/23/2023   Pt will improve LEFS to no less than 30/80 as proxy for functional improvement with home ADLs and higher level community activity Baseline: 10/80 Goal status: INITIAL  2.  Pt will self report knee and low back pain no greater than 3/10 for improved comfort and functional ability Baseline: 10/10 at worst Goal status: INITIAL   3.  Pt will improve LE MMT at least 4/5 for improved functional mobility and decrease pain Baseline:  Goal status: INITIAL  4.  Pt will increase 30 Second Sit to Stand rep count to no less than 10 reps for improved balance, strength, and functional mobility Baseline: 4 reps with UE  Goal status: INITIAL    PLAN:  PT FREQUENCY: 1-2x/week  PT DURATION: 8 weeks  PLANNED INTERVENTIONS: 97164- PT Re-evaluation, 97110-Therapeutic exercises, 97530- Therapeutic activity, [id number]- Neuromuscular re-education, 97535- Self Care, 44034- Manual therapy, [id number]- Gait training, [id number]- Aquatic Therapy, [id number]- Electrical stimulation (unattended), [id number]- Electrical stimulation (manual), 97016- Vasopneumatic device, Cryotherapy, and Moist heat.  PLAN FOR NEXT SESSION: core and LE strengthening, functional mobility training, hip flexor stretching    Ivor Mars, PT 11/21/2023, 7:57 AM

## 2023-11-24 ENCOUNTER — Other Ambulatory Visit: Payer: Self-pay

## 2023-11-24 ENCOUNTER — Ambulatory Visit (HOSPITAL_COMMUNITY): Admitting: Student

## 2023-11-24 DIAGNOSIS — R413 Other amnesia: Secondary | ICD-10-CM | POA: Diagnosis not present

## 2023-11-24 DIAGNOSIS — F3289 Other specified depressive episodes: Secondary | ICD-10-CM

## 2023-11-24 DIAGNOSIS — F4310 Post-traumatic stress disorder, unspecified: Secondary | ICD-10-CM

## 2023-11-24 MED ORDER — FLUOXETINE HCL 40 MG PO CAPS
40.0000 mg | ORAL_CAPSULE | Freq: Every morning | ORAL | 1 refills | Status: DC
Start: 2023-11-24 — End: 2023-11-25

## 2023-11-24 MED ORDER — PRAZOSIN HCL 1 MG PO CAPS
1.0000 mg | ORAL_CAPSULE | Freq: Every day | ORAL | 1 refills | Status: DC
Start: 2023-11-24 — End: 2023-11-25
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
## 2023-11-25 ENCOUNTER — Other Ambulatory Visit (HOSPITAL_COMMUNITY): Payer: Self-pay

## 2023-11-25 ENCOUNTER — Other Ambulatory Visit: Payer: Self-pay

## 2023-11-25 MED ORDER — PRAZOSIN HCL 1 MG PO CAPS
1.0000 mg | ORAL_CAPSULE | Freq: Every day | ORAL | 1 refills | Status: DC
  Filled 2023-11-25 – 2024-01-05 (×5): qty 30, 30d supply, fill #0

## 2023-11-25 MED ORDER — FLUOXETINE HCL 40 MG PO CAPS
40.0000 mg | ORAL_CAPSULE | Freq: Every morning | ORAL | 1 refills | Status: DC
  Filled 2023-11-25 – 2024-01-06 (×5): qty 30, 30d supply, fill #0

## 2023-11-25 NOTE — Addendum Note (Signed)
 Addended by: Varshini Arrants on: 11/25/2023 11:35 AM   Modules accepted: Orders

## 2023-11-26 ENCOUNTER — Other Ambulatory Visit (HOSPITAL_COMMUNITY): Payer: Self-pay

## 2023-11-28 ENCOUNTER — Other Ambulatory Visit: Payer: Self-pay

## 2023-11-30 ENCOUNTER — Ambulatory Visit: Admitting: Pharmacist

## 2023-12-05 ENCOUNTER — Other Ambulatory Visit: Payer: Self-pay

## 2023-12-06 ENCOUNTER — Other Ambulatory Visit: Payer: Self-pay

## 2023-12-07 ENCOUNTER — Other Ambulatory Visit: Payer: Self-pay

## 2023-12-12 ENCOUNTER — Ambulatory Visit: Admitting: Physician Assistant

## 2023-12-12 ENCOUNTER — Ambulatory Visit: Payer: Medicaid Other | Admitting: Physician Assistant

## 2023-12-12 ENCOUNTER — Encounter: Payer: Self-pay | Admitting: Physician Assistant

## 2023-12-12 NOTE — Progress Notes (Incomplete)
 Assessment/Plan:    Memory impairment    Tina Moss is a very pleasant 53 y.o. RH female with a history ofhypertension, hyperlipidemia, LM cerebral artery stroke, anemia, Vit D deficiency,  tobacco use,  anxiety, depression, history of fibroids, history of memory impairment of uncertain etiology, history syphilis in June 2023 followed by PCP  presenting today in follow-up for evaluation of memory loss. Patient is on donepezil  10 mg daily.  Memory is***.  MMSE today is***.  Mood is controlled, sees a Veterinary surgeon.  She is able to participate in her ADLs and drive without difficulty.***.     Recommendations:   Follow up in   months. Milligrams daily side effects discussed Recommend good control of cardiovascular risk factors Continue to control mood as per PCP and BH and monitor driving    Subjective:   This patient is here alone. Previous records as well as any outside records available were reviewed prior to todays visit.   Patient was last seen on 06/14/2019 for***.    Any changes in memory since last visit? .  Have issues with comprehension, but overall stable from prior.  She enjoys reading letters and cards. repeats oneself?  Endorsed Disoriented when walking into a room?  Patient denies ***  Misplacing objects?  She may misplace some things at times such as the glasses and menses, but then finds them after a while. Wandering behavior?  Denies, but she sees behavioral health.  There are family issues that contribute to depression  Any personality changes since last visit? Denies.   Any worsening depression?: denies.   Hallucinations or paranoia?  Denies.   Seizures?   Denies.    Any sleep changes? Sleeps well***. Does not sleep very well***.   Denies vivid dreams, REM behavior or sleepwalking   Sleep apnea?   denies ***  Any hygiene concerns?   Denies.   Independent of bathing and dressing?  Endorsed  Does the patient needs help with medications? Patient is in charge  sometimes he misses doses*** Who is in charge of the finances?  Mother is in charge   *** Any changes in appetite?  denies ***   Patient have trouble swallowing?  Denies.   Does the patient cook?  Any kitchen accidents such as leaving the stove on?   Denies.   Any headaches?    Denies.   Vision changes? Denies. Chronic pain?  She has chronic back and knee pain does PT at home Ambulates with difficulty?    Due to chronic pain, she may ambulate with some difficulty.***  Recent falls or head injuries?    Denies.      Unilateral weakness, numbness or tingling?  Denies.   Any tremors?  Denies.   Any anosmia?    Denies.   Any incontinence of urine?  Endorsed, wear pads Any bowel dysfunction?  Chronic constipation Patient lives with her sister.*** Does the patient drive?  Endorsed, sometimes she does not remember where she supposed to go, I get nervous stops ***     Initial visit 09/10/2021 the patient is seen in neurologic consultation at the request of Newlin, Enobong, MD for the evaluation of memory.  The patient is here alone. This is a 53 y.o. year old RH  female who has no recalling when her memory issues began, I will send my by Dr. Here.  She reports that she is very distracted, forgets appointments or things to do, forgets the time and she is unable to comprehend well.  She states that when she was a child, she was diagnosed with epilepsy, but she is not aware when she stopped taking medications for it.  When she was in grade school, she receives special education I had a Teacher, English as a foreign language .  Of note, during this visit, the patient does have difficulty comprehending, expressing as well as being incoherent at times.  She works at [employer], and has noted that lately she has been burning some of the biscuits.  When she drives, sometimes she does not know where she goes with the car and has to stop, and she reports that occasionally she has a blank .  She leaves objects  everywhere, the other day I left my keys in the drawer, and papers near the mattress .  She ambulates without difficulty, although in the past she had right-sided weakness without residual due to the stroke.  Her mood is depressed, denies any intermittent irritability.  She sleeps fairly well, denies vivid dreams or sleepwalking.  She does have some hallucinations seeing floating blocks, little colors dancing .  Sometimes, she reports being paranoid.  She denies any hygiene concerns, she is independent of bathing and dressing.  The medications are in a pillbox, but she forgets sometimes to take them.  When asked about who is in charge of the finances, incoherent answer is reported, stating there are calls that the insurance lady takes care of .  She cannot give a straight answer regarding finances.  Appetite is good, denies trouble swallowing.  She denies any headaches, sometimes I have double vision denies any dizziness, focal numbness or tingling, or unilateral weakness.  She denies tremors or anosmia.  Denies urine incontinence, sometimes she has intermittent constipation and diarrhea.  Denies sleep apnea, or alcohol.  She smokes less than a pack a day I used to smoke more .       MRI of the brain March 2023 1. No acute intracranial pathology. No significant change since the study from 04/03/2021. 2. 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  Past Medical History:  Diagnosis Date   Anemia    CVA (cerebral vascular accident) (HCC)    2020   Epilepsy (HCC)    Fibroids    H/O bacterial infection    H/O mumps    Hyperlipidemia    Hypertension    Seizures (HCC)      Past Surgical History:  Procedure Laterality Date   CESAREAN SECTION     x2. at term   TEE WITHOUT CARDIOVERSION N/A 04/06/2018   Procedure:  TRANSESOPHAGEAL ECHOCARDIOGRAM (TEE) BUBBLE STUDY;  Surgeon: Pietro Redell RAMAN, MD;  Location: [facility] ENDOSCOPY;  Service: Cardiovascular;  Laterality: N/A;   TUBAL LIGATION     WISDOM TOOTH EXTRACTION       PREVIOUS MEDICATIONS:   CURRENT MEDICATIONS:  Outpatient Encounter Medications as of 12/12/2023  Medication Sig   albuterol  (VENTOLIN  HFA) 108 (90 Base) MCG/ACT inhaler Inhale 1-2 puffs into the lungs every 6 (six) hours as needed for wheezing or shortness of breath.   atorvastatin  (LIPITOR) 80 MG tablet Take 1 tablet (80 mg total) by mouth daily.   benzonatate  (TESSALON ) 100 MG capsule Take 1 capsule (100 mg total) by mouth every 8 (eight) hours.   cetirizine  (ZYRTEC ) 10 MG tablet Take 1 tablet (10 mg total) by mouth daily.   chlorthalidone  (HYGROTON ) 25  MG tablet Take 1 tablet (25 mg total) by mouth daily.   clopidogrel  (PLAVIX ) 75 MG tablet Take 1 tablet (75 mg total) by mouth daily.   diclofenac  Sodium (VOLTAREN ) 1 % GEL Apply 2 g topically 4 (four) times daily.   donepezil  (ARICEPT ) 10 MG tablet Take 1 tablet (10 mg total) by mouth daily.   doxycycline  (VIBRAMYCIN ) 100 MG capsule Take 1 capsule (100 mg total) by mouth 2 (two) times daily.   FLUoxetine  (PROZAC ) 40 MG capsule Take 1 capsule (40 mg total) by mouth every morning.   furosemide  (LASIX ) 20 MG tablet Take 1 tablet (20 mg total) by mouth daily.   losartan  (COZAAR ) 50 MG tablet Take 1 tablet (50 mg total) by mouth daily.   meloxicam  (MOBIC ) 7.5 MG tablet Take 1 tablet (7.5 mg total) by mouth daily.   metoprolol  tartrate (LOPRESSOR ) 50 MG tablet Take 1 tablet (50 mg total) by mouth 2 (two) times daily.   oxybutynin  (DITROPAN  XL) 5 MG 24 hr tablet Take 1 tablet (5 mg total) by mouth at bedtime. For overactive bladder   prazosin  (MINIPRESS ) 1 MG capsule Take 1 capsule (1 mg total) by mouth at bedtime.   tiZANidine  (ZANAFLEX ) 4 MG tablet Take 1 tablet (4 mg total) by mouth every 8 (eight) hours as needed.   No facility-administered  encounter medications on file as of 12/12/2023.     Objective:     PHYSICAL EXAMINATION:    VITALS:  There were no vitals filed for this visit.  GEN:  The patient appears stated age and is in NAD. HEENT:  Normocephalic, atraumatic.   Neurological examination:  General: NAD, well-groomed, appears stated age. Orientation: The patient is alert. Oriented to person, place and not to date.*** Cranial nerves: There is good facial symmetry.The speech is fluent and clear. No aphasia or dysarthria. Fund of knowledge is appropriate. Recent memory impaired and remote memory is normal.  Attention and concentration are normal.  Able to name objects and repeat phrases.  Hearing is intact to conversational tone ***.   Delayed recall *** Sensation: Sensation is intact to light touch throughout Motor: Strength is at least antigravity x4. DTR's 2/4 in UE/LE      05/25/2022   11:21 AM 05/19/2022    2:00 PM 09/10/2021    8:00 AM  Montreal Cognitive Assessment   Visuospatial/ Executive (0/5) 2 2 3   Naming (0/3) 1 1 2   Attention: Read list of digits (0/2) 2 2 2   Attention: Read list of letters (0/1) 1 1 1   Attention: Serial 7 subtraction starting at 100 (0/3) 0 0 0  Language: Repeat phrase (0/2) 1 1 0  Language : Fluency (0/1) 0 0 1  Abstraction (0/2) 0 0 0  Delayed Recall (0/5) 4 4 2   Orientation (0/6) 5 5 6   Total 16 16 17   Adjusted Score (based on education) 17 16 18        06/14/2023    2:00 PM 11/18/2022    3:00 PM 09/09/2021   10:56 AM  MMSE - Mini Mental State Exam  Orientation to time 4 5 4   Orientation to Place 4 5 5   Registration 3 3 3   Attention/ Calculation 4 5 5   Recall 2 1 2   Language- name 2 objects 2 2 2   Language- repeat 1 1 1   Language- follow 3 step command 3 3 3   Language- read & follow direction 1 1 1   Write a sentence 1 1 1   Copy design 1 1  1  Total score 26 28 28        Movement examination: Tone: There is normal tone in the UE/LE Abnormal movements:  no  tremor.  No myoclonus.  No asterixis.   Coordination:  There is no decremation with RAM's. Normal finger to nose  Gait and Station: The patient has no difficulty arising out of a deep-seated chair without the use of the hands. The patient's stride length is good.  Gait is cautious and narrow.   Thank you for allowing us  the opportunity to participate in the care of this nice patient. Please do not hesitate to contact us  for any questions or concerns.   Total time spent on today's visit was *** minutes dedicated to this patient today, preparing to see patient, examining the patient, ordering tests and/or medications and counseling the patient, documenting clinical information in the EHR or other health record, independently interpreting results and communicating results to the patient/family, discussing treatment and goals, answering patient's questions and coordinating care.  Cc:  Delbert Clam, MD  Camie Sevin 12/12/2023 6:13 AM

## 2023-12-19 DIAGNOSIS — I2510 Atherosclerotic heart disease of native coronary artery without angina pectoris: Secondary | ICD-10-CM | POA: Insufficient documentation

## 2023-12-19 NOTE — Progress Notes (Deleted)
    OFFICE NOTE:    Date:  12/19/2023  ID:  Tina Moss, DOB [birth year], MRN [id number] PCP: Delbert Clam, MD  [facility] Providers Cardiologist:  None { Click to update primary MD,subspecialty MD or APP then REFRESH:1}       Coronary artery Ca2+ CCTA 02/16/2022: CAC score 13, 89th percentile, minimal nonobstructive CAD, RCA, LM, LAD 1-24% aortic atherosclerosis  Premature ventricular contractions (PVCs) Monitor 09/2023: One run of NSVT, 18 beats, short runs of SVT, PVCs 12%  TTE 09/02/23: EF 55-60, no RWMA Hypertension Hyperlipidemia Aortic atherosclerosis History of cerebrovascular accident (CVA) in 2018 [L MCA - affected speech] Seizure disorder Diabetes mellitus      Discussed the use of AI scribe software for clinical note transcription with the patient, who gave verbal consent to proceed. History of Present Illness Tina Moss is a 53 y.o. female who returns for follow up of CAD, PVCs. She was last seen in March 2025 with similar symptoms of fatigue, shortness of breath, and palpitations. At that time, her blood pressure was uncontrolled, and medications were adjusted with the addition of chlorthalidone . A cardiac monitor was obtained to assess her palpitations, which demonstrated PVCs with 12% burden. Her beta blocker dose was adjusted. She has been followed in our PharmD HTN clinic as well. She has had some issues with her medications in the past.     ROS-See HPI***    Studies Reviewed:      *** Results  Risk Assessment/Calculations: {Does this patient have ATRIAL FIBRILLATION?:[phone]} No BP recorded.  {Refresh Note OR Click here to enter BP  :1}***      Physical Exam:  VS:  LMP  (LMP Unknown) Comment: more than a year since last menstrual       Wt Readings from Last 3 Encounters:  10/13/23 221 lb 3.2 oz (100.3 kg)  09/09/23 217 lb (98.4 kg)  08/24/23 220 lb 9.6 oz (100.1 kg)    Physical Exam***     Assessment and Plan:    Assessment &  Plan Coronary artery calcification seen on CT scan  Essential hypertension  Mixed hyperlipidemia  PVC's (premature ventricular contractions)  Assessment and Plan Assessment & Plan    {      :1}    {Are you ordering a CV Procedure (e.g. stress test, cath, DCCV, TEE, etc)?   Press F2        :[id number]}  Dispo:  No follow-ups on file.  Signed, Glendia Ferrier, PA-C

## 2023-12-20 ENCOUNTER — Ambulatory Visit: Admitting: Physician Assistant

## 2023-12-20 DIAGNOSIS — E782 Mixed hyperlipidemia: Secondary | ICD-10-CM

## 2023-12-20 DIAGNOSIS — I2510 Atherosclerotic heart disease of native coronary artery without angina pectoris: Secondary | ICD-10-CM

## 2023-12-20 DIAGNOSIS — I493 Ventricular premature depolarization: Secondary | ICD-10-CM

## 2023-12-20 DIAGNOSIS — I10 Essential (primary) hypertension: Secondary | ICD-10-CM

## 2023-12-21 ENCOUNTER — Telehealth: Admitting: Physician Assistant

## 2023-12-21 NOTE — Progress Notes (Incomplete)
 Assessment/Plan:    Memory impairment    Tina Moss is a very pleasant 53 y.o. RH female with a history ofhypertension, hyperlipidemia, LM cerebral artery stroke, anemia, Vit D deficiency,  tobacco use,  anxiety, depression, history of fibroids, history of memory impairment of uncertain etiology, history syphilis in June 2023 followed by PCP  presenting today in follow-up for evaluation of memory loss. Patient is on donepezil  10 mg daily.  Memory is***.  MMSE today is***.  Mood is controlled, sees a Veterinary surgeon.  She is able to participate in her ADLs and drive without difficulty.***.     Recommendations:   Follow up in   months. Milligrams daily side effects discussed Recommend good control of cardiovascular risk factors Continue to control mood as per PCP and BH and monitor driving    Subjective:   This patient is here alone. Previous records as well as any outside records available were reviewed prior to todays visit.   Patient was last seen on 06/14/2019 for***.    Any changes in memory since last visit? .  Have issues with comprehension, but overall stable from prior.  She enjoys reading letters and cards. repeats oneself?  Endorsed Disoriented when walking into a room?  Patient denies ***  Misplacing objects?  She may misplace some things at times such as the glasses and menses, but then finds them after a while. Wandering behavior?  Denies, but she sees behavioral health.  There are family issues that contribute to depression  Any personality changes since last visit? Denies.   Any worsening depression?: denies.   Hallucinations or paranoia?  Denies.   Seizures?   Denies.    Any sleep changes? Sleeps well***. Does not sleep very well***.   Denies vivid dreams, REM behavior or sleepwalking   Sleep apnea?   denies ***  Any hygiene concerns?   Denies.   Independent of bathing and dressing?  Endorsed  Does the patient needs help with medications? Patient is in charge  sometimes he misses doses*** Who is in charge of the finances?  Mother is in charge   *** Any changes in appetite?  denies ***   Patient have trouble swallowing?  Denies.   Does the patient cook?  Any kitchen accidents such as leaving the stove on?   Denies.   Any headaches?    Denies.   Vision changes? Denies. Chronic pain?  She has chronic back and knee pain does PT at home Ambulates with difficulty?    Due to chronic pain, she may ambulate with some difficulty.***  Recent falls or head injuries?    Denies.      Unilateral weakness, numbness or tingling?  Denies.   Any tremors?  Denies.   Any anosmia?    Denies.   Any incontinence of urine?  Endorsed, wear pads Any bowel dysfunction?  Chronic constipation Patient lives with her sister.*** Does the patient drive?  Endorsed, sometimes she does not remember where she supposed to go, I get nervous stops ***     Initial visit 09/10/2021 the patient is seen in neurologic consultation at the request of Newlin, Enobong, MD for the evaluation of memory.  The patient is here alone. This is a 53 y.o. year old RH  female who has no recalling when her memory issues began, I will send my by Dr. Here.  She reports that she is very distracted, forgets appointments or things to do, forgets the time and she is unable to comprehend well.  She states that when she was a child, she was diagnosed with epilepsy, but she is not aware when she stopped taking medications for it.  When she was in grade school, she receives special education I had a Teacher, English as a foreign language .  Of note, during this visit, the patient does have difficulty comprehending, expressing as well as being incoherent at times.  She works at [employer], and has noted that lately she has been burning some of the biscuits.  When she drives, sometimes she does not know where she goes with the car and has to stop, and she reports that occasionally she has a blank .  She leaves objects  everywhere, the other day I left my keys in the drawer, and papers near the mattress .  She ambulates without difficulty, although in the past she had right-sided weakness without residual due to the stroke.  Her mood is depressed, denies any intermittent irritability.  She sleeps fairly well, denies vivid dreams or sleepwalking.  She does have some hallucinations seeing floating blocks, little colors dancing .  Sometimes, she reports being paranoid.  She denies any hygiene concerns, she is independent of bathing and dressing.  The medications are in a pillbox, but she forgets sometimes to take them.  When asked about who is in charge of the finances, incoherent answer is reported, stating there are calls that the insurance lady takes care of .  She cannot give a straight answer regarding finances.  Appetite is good, denies trouble swallowing.  She denies any headaches, sometimes I have double vision denies any dizziness, focal numbness or tingling, or unilateral weakness.  She denies tremors or anosmia.  Denies urine incontinence, sometimes she has intermittent constipation and diarrhea.  Denies sleep apnea, or alcohol.  She smokes less than a pack a day I used to smoke more .       MRI of the brain March 2023 1. No acute intracranial pathology. No significant change since the study from 04/03/2021. 2. 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  Past Medical History:  Diagnosis Date   Anemia    CVA (cerebral vascular accident) (HCC)    2020   Epilepsy (HCC)    Fibroids    H/O bacterial infection    H/O mumps    Hyperlipidemia    Hypertension    Seizures (HCC)      Past Surgical History:  Procedure Laterality Date   CESAREAN SECTION     x2. at term   TEE WITHOUT CARDIOVERSION N/A 04/06/2018   Procedure:  TRANSESOPHAGEAL ECHOCARDIOGRAM (TEE) BUBBLE STUDY;  Surgeon: Pietro Redell RAMAN, MD;  Location: [facility] ENDOSCOPY;  Service: Cardiovascular;  Laterality: N/A;   TUBAL LIGATION     WISDOM TOOTH EXTRACTION       PREVIOUS MEDICATIONS:   CURRENT MEDICATIONS:  Outpatient Encounter Medications as of 12/21/2023  Medication Sig   albuterol  (VENTOLIN  HFA) 108 (90 Base) MCG/ACT inhaler Inhale 1-2 puffs into the lungs every 6 (six) hours as needed for wheezing or shortness of breath.   atorvastatin  (LIPITOR) 80 MG tablet Take 1 tablet (80 mg total) by mouth daily.   benzonatate  (TESSALON ) 100 MG capsule Take 1 capsule (100 mg total) by mouth every 8 (eight) hours.   cetirizine  (ZYRTEC ) 10 MG tablet Take 1 tablet (10 mg total) by mouth daily.   chlorthalidone  (HYGROTON ) 25  MG tablet Take 1 tablet (25 mg total) by mouth daily.   clopidogrel  (PLAVIX ) 75 MG tablet Take 1 tablet (75 mg total) by mouth daily.   diclofenac  Sodium (VOLTAREN ) 1 % GEL Apply 2 g topically 4 (four) times daily.   donepezil  (ARICEPT ) 10 MG tablet Take 1 tablet (10 mg total) by mouth daily.   doxycycline  (VIBRAMYCIN ) 100 MG capsule Take 1 capsule (100 mg total) by mouth 2 (two) times daily.   FLUoxetine  (PROZAC ) 40 MG capsule Take 1 capsule (40 mg total) by mouth every morning.   furosemide  (LASIX ) 20 MG tablet Take 1 tablet (20 mg total) by mouth daily.   losartan  (COZAAR ) 50 MG tablet Take 1 tablet (50 mg total) by mouth daily.   meloxicam  (MOBIC ) 7.5 MG tablet Take 1 tablet (7.5 mg total) by mouth daily.   metoprolol  tartrate (LOPRESSOR ) 50 MG tablet Take 1 tablet (50 mg total) by mouth 2 (two) times daily.   oxybutynin  (DITROPAN  XL) 5 MG 24 hr tablet Take 1 tablet (5 mg total) by mouth at bedtime. For overactive bladder   prazosin  (MINIPRESS ) 1 MG capsule Take 1 capsule (1 mg total) by mouth at bedtime.   tiZANidine  (ZANAFLEX ) 4 MG tablet Take 1 tablet (4 mg total) by mouth every 8 (eight) hours as needed.   No facility-administered  encounter medications on file as of 12/21/2023.     Objective:     PHYSICAL EXAMINATION:    VITALS:  There were no vitals filed for this visit.  GEN:  The patient appears stated age and is in NAD. HEENT:  Normocephalic, atraumatic.   Neurological examination:  General: NAD, well-groomed, appears stated age. Orientation: The patient is alert. Oriented to person, place and not to date.*** Cranial nerves: There is good facial symmetry.The speech is fluent and clear. No aphasia or dysarthria. Fund of knowledge is appropriate. Recent memory impaired and remote memory is normal.  Attention and concentration are normal.  Able to name objects and repeat phrases.  Hearing is intact to conversational tone ***.   Delayed recall *** Sensation: Sensation is intact to light touch throughout Motor: Strength is at least antigravity x4. DTR's 2/4 in UE/LE      05/25/2022   11:21 AM 05/19/2022    2:00 PM 09/10/2021    8:00 AM  Montreal Cognitive Assessment   Visuospatial/ Executive (0/5) 2 2 3   Naming (0/3) 1 1 2   Attention: Read list of digits (0/2) 2 2 2   Attention: Read list of letters (0/1) 1 1 1   Attention: Serial 7 subtraction starting at 100 (0/3) 0 0 0  Language: Repeat phrase (0/2) 1 1 0  Language : Fluency (0/1) 0 0 1  Abstraction (0/2) 0 0 0  Delayed Recall (0/5) 4 4 2   Orientation (0/6) 5 5 6   Total 16 16 17   Adjusted Score (based on education) 17 16 18        06/14/2023    2:00 PM 11/18/2022    3:00 PM 09/09/2021   10:56 AM  MMSE - Mini Mental State Exam  Orientation to time 4 5 4   Orientation to Place 4 5 5   Registration 3 3 3   Attention/ Calculation 4 5 5   Recall 2 1 2   Language- name 2 objects 2 2 2   Language- repeat 1 1 1   Language- follow 3 step command 3 3 3   Language- read & follow direction 1 1 1   Write a sentence 1 1 1   Copy design 1 1  1  Total score 26 28 28        Movement examination: Tone: There is normal tone in the UE/LE Abnormal movements:  no  tremor.  No myoclonus.  No asterixis.   Coordination:  There is no decremation with RAM's. Normal finger to nose  Gait and Station: The patient has no difficulty arising out of a deep-seated chair without the use of the hands. The patient's stride length is good.  Gait is cautious and narrow.   Thank you for allowing us  the opportunity to participate in the care of this nice patient. Please do not hesitate to contact us  for any questions or concerns.   Total time spent on today's visit was *** minutes dedicated to this patient today, preparing to see patient, examining the patient, ordering tests and/or medications and counseling the patient, documenting clinical information in the EHR or other health record, independently interpreting results and communicating results to the patient/family, discussing treatment and goals, answering patient's questions and coordinating care.  Cc:  Delbert Clam, MD  Camie Sevin 12/21/2023 5:34 AM

## 2023-12-26 ENCOUNTER — Ambulatory Visit: Admitting: Physician Assistant

## 2023-12-26 ENCOUNTER — Encounter: Payer: Self-pay | Admitting: Physician Assistant

## 2023-12-26 ENCOUNTER — Other Ambulatory Visit: Payer: Self-pay

## 2023-12-26 VITALS — BP 157/96 | HR 85 | Resp 18 | Ht 66.0 in | Wt 222.0 lb

## 2023-12-26 DIAGNOSIS — G3184 Mild cognitive impairment, so stated: Secondary | ICD-10-CM | POA: Diagnosis not present

## 2023-12-26 NOTE — Patient Instructions (Signed)
 It was a pleasure to see you today at our office.   Recommendations:  Follow up in  6 months Continue Donepezil   10 mg 1 tablet  daily. Continue psychiatry   Whom to call:  Memory  decline, memory medications: Call our office [phone]   For psychiatric meds, mood meds: Please have your primary care physician manage these medications.   Counseling regarding caregiver distress, including caregiver depression, anxiety and issues regarding community resources, adult day care programs, adult living facilities, or memory care questions:   Feel free to contact Misty Waddell Simmer, Social Worker at [phone]   For assessment of decision of mental capacity and competency:  Call Dr. Rosaline Nine, geriatric psychiatrist at [phone]   If you have any severe symptoms of a stroke, or other severe issues such as confusion,severe chills or fever, etc call 911 or go to the ER as you may need to be evaluated further      RECOMMENDATIONS FOR ALL PATIENTS WITH MEMORY PROBLEMS: 1. Continue to exercise (Recommend 30 minutes of walking everyday, or 3 hours every week) 2. Increase social interactions - continue going to [place] and enjoy social gatherings with friends and family 3. Eat healthy, avoid fried foods and eat more fruits and vegetables 4. Maintain adequate blood pressure, blood sugar, and blood cholesterol level. Reducing the risk of stroke and cardiovascular disease also helps promoting better memory. 5. Avoid stressful situations. Live a simple life and avoid aggravations. Organize your time and prepare for the next day in anticipation. 6. Sleep well, avoid any interruptions of sleep and avoid any distractions in the bedroom that may interfere with adequate sleep quality 7. Avoid sugar, avoid sweets as there is a strong link between excessive sugar intake, diabetes, and cognitive impairment We discussed the Mediterranean diet, which has been shown to help patients reduce the risk  of progressive memory disorders and reduces cardiovascular risk. This includes eating fish, eat fruits and green leafy vegetables, nuts like almonds and hazelnuts, walnuts, and also use olive oil. Avoid fast foods and fried foods as much as possible. Avoid sweets and sugar as sugar use has been linked to worsening of memory function.  There is always a concern of gradual progression of memory problems. If this is the case, then we may need to adjust level of care according to patient needs. Support, both to the patient and caregiver, should then be put into place.    FALL PRECAUTIONS: Be cautious when walking. Scan the area for obstacles that may increase the risk of trips and falls. When getting up in the mornings, sit up at the edge of the bed for a few minutes before getting out of bed. Consider elevating the bed at the head end to avoid drop of blood pressure when getting up. Walk always in a well-lit room (use night lights in the walls). Avoid area rugs or power cords from appliances in the middle of the walkways. Use a walker or a cane if necessary and consider physical therapy for balance exercise. Get your eyesight checked regularly.  FINANCIAL OVERSIGHT: Supervision, especially oversight when making financial decisions or transactions is also recommended.  HOME SAFETY: Consider the safety of the kitchen when operating appliances like stoves, microwave oven, and blender. Consider having supervision and share cooking responsibilities until no longer able to participate in those. Accidents with firearms and other hazards in the house should be identified and addressed as well.   ABILITY TO BE LEFT ALONE: If patient is unable  to contact 911 operator, consider using [facility], or when the need is there, arrange for someone to stay with patients. Smoking is a fire hazard, consider supervision or cessation. Risk of wandering should be assessed by caregiver and if detected at any point, supervision and  safe proof recommendations should be instituted.  MEDICATION SUPERVISION: Inability to self-administer medication needs to be constantly addressed. Implement a mechanism to ensure safe administration of the medications.   DRIVING: Regarding driving, in patients with progressive memory problems, driving will be impaired. We advise to have someone else do the driving if trouble finding directions or if minor accidents are reported. Independent driving assessment is available to determine safety of driving.   If you are interested in the driving assessment, you can contact the following:  [employer] in [place] [phone]  [facility] [phone]  [facility] [phone]  [facility] [phone] or [phone]

## 2023-12-26 NOTE — Progress Notes (Signed)
 Assessment/Plan:    Mild cognitive impairment of uncertain etiology    Tina Moss is a very pleasant 53 y.o. RH female with a history of hypertension, hyperlipidemia, LM cerebral artery stroke, anemia, Vit D deficiency,  tobacco use,  anxiety, depression, history of fibroids, history of MCI of uncertain etiology, history syphilis in June 2023 followed by PCP  presenting today in follow-up for evaluation of memory loss. Patient is on donepezil  10 mg daily.  MMSE today 22/30. She is recovering from sinobronchitis, remains sick appearing which along with mood changes during illness may contribute to lower scores today. She is able to participate in her ADLs and drive without difficulty when healthy.     Recommendations:   Follow up in 6  months.  Continue donepezil  10 mg daily side effects discussed Recommend good control of cardiovascular risk factors. Patient informed of elevated blood pressure Continue to control mood as per PCP and BH  monitor driving    Subjective:   This patient is here alone. Previous records as well as any outside records available were reviewed prior to todays visit.   Patient was last seen on 06/14/2023 .    Any changes in memory since last visit? I can't keep up with certain things, I can't really tell because I am not feeling well. Have issues with comprehension, but overall stable from prior.  She no longer enjoys reading letters and cards. repeats oneself?  Endorsed Disoriented when walking into a room?  Patient denies    Misplacing objects?  She may misplace some things at times such as the glasses but then finds them after a while. Wandering behavior?  Denies, but she sees behavioral health.  There are family issues that contribute to depression . I need to see the counselor soon, she says  Any personality changes since last visit? Denies.   Any worsening depression?: denies.   Hallucinations or paranoia?  Denies.   Seizures?   Denies.     Any sleep changes? Not sleeping well due to preoccupation.  She reports  vivid dreams, denies REM behavior or sleepwalking   Sleep apnea?   denies    Any hygiene concerns?   Denies.   Independent of bathing and dressing?  Endorsed  Does the patient needs help with medications? Patient is in charge sometimes he misses doses  Who is in charge of the finances?  Mother is in charge     Any changes in appetite?  Decrease, sometimes she does not have appetite  Patient have trouble swallowing?  Denies.   Does the patient cook?  Any kitchen accidents such as leaving the stove on?   Denies.   Any headaches?    Denies.   Vision changes? Denies. Chronic pain?  She has chronic back and knee pain does PT at home Ambulates with difficulty?    Due to chronic pain, she may ambulate with some difficulty.   Recent falls or head injuries?    Denies.      Unilateral weakness, numbness or tingling?  Denies.   Any tremors?  Denies.   Any anosmia?    Denies.   Any incontinence of urine?  Endorsed, I have leakage Any bowel dysfunction?  Chronic constipation Patient lives with her sister.  Does the patient drive?  Endorsed, sometimes she does not remember where she supposed to go, I get nervous at stops   Quit smoking several months go       Initial visit 09/10/2021 the patient is  seen in neurologic consultation at the request of Newlin, Enobong, MD for the evaluation of memory.  The patient is here alone. This is a 53 y.o. year old RH  female who has no recalling when her memory issues began, I will send my by Dr. Here.  She reports that she is very distracted, forgets appointments or things to do, forgets the time and she is unable to comprehend well.  She states that when she was a child, she was diagnosed with epilepsy, but she is not aware when she stopped taking medications for it.  When she was in grade school, she receives special education I had a Teacher, English as a foreign language .  Of note, during this visit, the  patient does have difficulty comprehending, expressing as well as being incoherent at times.  She works at [employer], and has noted that lately she has been burning some of the biscuits.  When she drives, sometimes she does not know where she goes with the car and has to stop, and she reports that occasionally she has a blank .  She leaves objects everywhere, the other day I left my keys in the drawer, and papers near the mattress .  She ambulates without difficulty, although in the past she had right-sided weakness without residual due to the stroke.  Her mood is depressed, denies any intermittent irritability.  She sleeps fairly well, denies vivid dreams or sleepwalking.  She does have some hallucinations seeing floating blocks, little colors dancing .  Sometimes, she reports being paranoid.  She denies any hygiene concerns, she is independent of bathing and dressing.  The medications are in a pillbox, but she forgets sometimes to take them.  When asked about who is in charge of the finances, incoherent answer is reported, stating there are calls that the insurance lady takes care of .  She cannot give a straight answer regarding finances.  Appetite is good, denies trouble swallowing.  She denies any headaches, sometimes I have double vision denies any dizziness, focal numbness or tingling, or unilateral weakness.  She denies tremors or anosmia.  Denies urine incontinence, sometimes she has intermittent constipation and diarrhea.  Denies sleep apnea, or alcohol.  She smokes less than a pack a day I used to smoke more .       MRI of the brain March 2023 1. No acute intracranial pathology. No significant change since the study from 04/03/2021. 2. 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  Past Medical History:   Diagnosis Date   Anemia    CVA (cerebral vascular accident) (HCC)    2020   Epilepsy (HCC)    Fibroids    H/O bacterial infection    H/O mumps    Hyperlipidemia    Hypertension    Seizures (HCC)      Past Surgical History:  Procedure Laterality Date   CESAREAN SECTION     x2. at term   TEE WITHOUT CARDIOVERSION N/A 04/06/2018   Procedure: TRANSESOPHAGEAL ECHOCARDIOGRAM (TEE) BUBBLE STUDY;  Surgeon: Pietro Redell RAMAN, MD;  Location: [facility] ENDOSCOPY;  Service: Cardiovascular;  Laterality: N/A;   TUBAL LIGATION     WISDOM TOOTH EXTRACTION       PREVIOUS MEDICATIONS:   CURRENT MEDICATIONS:  Outpatient Encounter Medications as of 12/26/2023  Medication Sig   albuterol  (VENTOLIN  HFA) 108 (90 Base) MCG/ACT inhaler Inhale  1-2 puffs into the lungs every 6 (six) hours as needed for wheezing or shortness of breath.   atorvastatin  (LIPITOR) 80 MG tablet Take 1 tablet (80 mg total) by mouth daily.   benzonatate  (TESSALON ) 100 MG capsule Take 1 capsule (100 mg total) by mouth every 8 (eight) hours.   cetirizine  (ZYRTEC ) 10 MG tablet Take 1 tablet (10 mg total) by mouth daily.   chlorthalidone  (HYGROTON ) 25 MG tablet Take 1 tablet (25 mg total) by mouth daily.   clopidogrel  (PLAVIX ) 75 MG tablet Take 1 tablet (75 mg total) by mouth daily.   diclofenac  Sodium (VOLTAREN ) 1 % GEL Apply 2 g topically 4 (four) times daily.   donepezil  (ARICEPT ) 10 MG tablet Take 1 tablet (10 mg total) by mouth daily.   doxycycline  (VIBRAMYCIN ) 100 MG capsule Take 1 capsule (100 mg total) by mouth 2 (two) times daily.   FLUoxetine  (PROZAC ) 40 MG capsule Take 1 capsule (40 mg total) by mouth every morning.   furosemide  (LASIX ) 20 MG tablet Take 1 tablet (20 mg total) by mouth daily.   losartan  (COZAAR ) 50 MG tablet Take 1 tablet (50 mg total) by mouth daily.   meloxicam  (MOBIC ) 7.5 MG tablet Take 1 tablet (7.5 mg total) by mouth daily.   metoprolol  tartrate (LOPRESSOR ) 50 MG tablet Take 1 tablet (50 mg total) by  mouth 2 (two) times daily.   oxybutynin  (DITROPAN  XL) 5 MG 24 hr tablet Take 1 tablet (5 mg total) by mouth at bedtime. For overactive bladder   prazosin  (MINIPRESS ) 1 MG capsule Take 1 capsule (1 mg total) by mouth at bedtime.   tiZANidine  (ZANAFLEX ) 4 MG tablet Take 1 tablet (4 mg total) by mouth every 8 (eight) hours as needed.   No facility-administered encounter medications on file as of 12/26/2023.     Objective:     PHYSICAL EXAMINATION:    VITALS:   Vitals:   12/26/23 0936 12/26/23 0942 12/26/23 0946  BP: (!) 157/96 (!) 155/92 (!) 157/96  Pulse: 93 85   Resp: 18    SpO2: 98%    Weight: 222 lb (100.7 kg)    Height: 5' 6 (1.676 m)      GEN:  The patient appears stated age and is in NAD. HEENT:  Normocephalic, atraumatic.   Neurological examination:  General: NAD, well-groomed, appears stated age. Orientation: The patient is alert. Oriented to person, place and not to date.  Cranial nerves: There is good facial symmetry.The speech is fluent and clear, tangential and sometimes hesitant. No aphasia or dysarthria. Fund of knowledge is appropriate. Recent memory impaired and remote memory is normal.  Attention and concentration are normal.  Able to name objects and repeat phrases.  Hearing is intact to conversational tone   Delayed recall 1/3 Sensation: Sensation is intact to light touch throughout Motor: Strength is at least antigravity x4. DTR's 2/4 in UE/LE      05/25/2022   11:21 AM 05/19/2022    2:00 PM 09/10/2021    8:00 AM  Montreal Cognitive Assessment   Visuospatial/ Executive (0/5) 2 2 3   Naming (0/3) 1 1 2   Attention: Read list of digits (0/2) 2 2 2   Attention: Read list of letters (0/1) 1 1 1   Attention: Serial 7 subtraction starting at 100 (0/3) 0 0 0  Language: Repeat phrase (0/2) 1 1 0  Language : Fluency (0/1) 0 0 1  Abstraction (0/2) 0 0 0  Delayed Recall (0/5) 4 4 2   Orientation (0/6) 5  5 6  Total 16 16 17   Adjusted Score (based on education) 17  16 18        12/26/2023    6:00 PM 06/14/2023    2:00 PM 11/18/2022    3:00 PM  MMSE - Mini Mental State Exam  Orientation to time 2 4 5   Orientation to Place 3 4 5   Registration 3 3 3   Attention/ Calculation 4 4 5   Recall 1 2 1   Language- name 2 objects 2 2 2   Language- repeat 1 1 1   Language- follow 3 step command 3 3 3   Language- read & follow direction 1 1 1   Write a sentence 1 1 1   Copy design 1 1 1   Total score 22 26 28        Movement examination: Tone: There is normal tone in the UE/LE Abnormal movements:  no tremor.  No myoclonus.  No asterixis.   Coordination:  There is no decremation with RAM's. Normal finger to nose  Gait and Station: The patient has no difficulty arising out of a deep-seated chair without the use of the hands. The patient's stride length is good but slow.  Gait is cautious and narrow.   Thank you for allowing us  the opportunity to participate in the care of this nice patient. Please do not hesitate to contact us  for any questions or concerns.   Total time spent on today's visit was 31 minutes dedicated to this patient today, preparing to see patient, examining the patient, ordering tests and/or medications and counseling the patient, documenting clinical information in the EHR or other health record, independently interpreting results and communicating results to the patient/family, discussing treatment and goals, answering patient's questions and coordinating care.  Cc:  Delbert Clam, MD  Camie Sevin 12/26/2023 6:13 PM

## 2023-12-28 ENCOUNTER — Other Ambulatory Visit (HOSPITAL_COMMUNITY): Payer: Self-pay

## 2023-12-28 ENCOUNTER — Other Ambulatory Visit: Payer: Self-pay | Admitting: Orthopaedic Surgery

## 2023-12-28 ENCOUNTER — Other Ambulatory Visit: Payer: Self-pay

## 2023-12-28 ENCOUNTER — Ambulatory Visit: Attending: Cardiology | Admitting: Pharmacist

## 2023-12-28 VITALS — BP 150/100 | HR 89

## 2023-12-28 DIAGNOSIS — I10 Essential (primary) hypertension: Secondary | ICD-10-CM | POA: Insufficient documentation

## 2023-12-28 MED ORDER — AMLODIPINE BESYLATE 5 MG PO TABS
5.0000 mg | ORAL_TABLET | Freq: Every day | ORAL | 3 refills | Status: AC
  Filled 2023-12-28: qty 11, 11d supply, fill #0
  Filled 2023-12-28: qty 11, 11d supply, fill #1
  Filled 2024-01-05 – 2024-01-06 (×2): qty 30, 30d supply, fill #1
  Filled 2024-01-31: qty 30, 30d supply, fill #0
  Filled 2024-03-01: qty 30, 30d supply, fill #1
  Filled 2024-03-29 – 2024-04-03 (×2): qty 30, 30d supply, fill #2
  Filled 2024-04-30: qty 30, 30d supply, fill #3
  Filled 2024-05-28: qty 30, 30d supply, fill #4
  Filled 2024-07-02 – 2024-07-03 (×3): qty 30, 30d supply, fill #5
  Filled ????-??-??: fill #2

## 2023-12-28 MED ORDER — BLOOD PRESSURE MONITOR MISC: 1.0000 | Freq: Every day | 0 refills | Status: AC

## 2023-12-28 MED ORDER — MELOXICAM 7.5 MG PO TABS
7.5000 mg | ORAL_TABLET | Freq: Every day | ORAL | 0 refills | Status: DC
  Filled 2024-01-05: qty 30, 30d supply, fill #0

## 2023-12-28 NOTE — Assessment & Plan Note (Addendum)
 Assessment: Blood pressure elevated in clinic today. No home blood pressure readings available Blood pressure in clinic today consistent with readings at other visits She gets her meds [facility] and reports its helped a lot with her keeping track of her medications.  I did confirm that all the medications are in the pill pack She denies any headaches or dizziness Reports feeling sleepy after taking her medications but when she takes a short nap she feels fine Doing physical therapy exercises for her knee and back She needs a new blood pressure cuff since she gave her store mom-I will send blood pressure cuff to [facility] where they should be able to fill it under her Medicaid.  Patient given address and phone number to [facility]  Plan: Start amlodipine  5 mg daily A short supply was sent downstairs to [facility] for her to pick up today The remaining should be filled in her med box which should be filled next week Continue chlorthalidone  25mg  daily, losartan  50mg  daily, furosemide  20mg  daily, metoprolol  tartrate 50mg  twice a day Follow-up with Artist next week Follow-up with me at the end of August

## 2023-12-28 NOTE — Progress Notes (Signed)
 Patient ID: Tina Moss                 DOB: 1970-07-03                      MRN: [id number]      HPI: Tina Moss is a 53 y.o. female referred by Dr. Santo to HTN clinic. PMH is significant for HTN, memory impairment, LM cerebral artery stroke. She was seen by Dr. Santo 09/09/23.  Medication compliance was questioned and blood pressure was elevated 140/92.    Patient seen in clinic 09/27/2023.  At that time compliance seem to be the biggest issue.  Her blood pressure at that visit was 134/90 with a heart rate of 91.  With the help of [name] over at [employer] [facility] she was transition to pill packs with [name].  She was provided written information on all the medication she takes and why she is taking them.  We discussed keeping her medications on her bed or her dresser and encouraged her not to skip doses.  She was asked to check her blood pressure daily and bring her log and monitor with her.  I saw her in clinic 10/17/23. She did not bring her blood pressure log or medication. She did call me after the visit and we determined that she was not taking Donepezil , Chlorthalidone , Furosemide , Metoprolol . They had not yet been added to her pill pack. She found her pill bottles and put them with her pillpack. She was supposed to follow up with me on 5/29. She has canceled her last few visits.   Patient presents today to clinic for follow-up.  She brings in her pill pack box which is up-to-date to today.  She reports compliance and denies any missed doses.  She reports that she takes meds at 8 AM and 10 PM.  Reports having some hot flashes with tingling that dissipate on their own.  Chronic back pain but does not take any medication.  Denies any headaches or dizziness.  She gave her mom her blood pressure cuff and her mom lives in [place].  She says when she checks it her blood pressure is okay.  She is not able to tell me what numbers she gets.   Current HTN meds:  chlorthalidone  25mg  daily, losartan  50mg  daily, furosemide  20mg  daily, metoprolol  tartrate 50mg  twice a day Previously tried:  BP goal: <130/80  Family History:  Family History  Problem Relation Age of Onset   Hypertension Mother    Hypertension Sister    Arthritis Sister        knees   Mental illness Daughter        ? bipolar   Hypertension Brother    Deafness Brother    Speech disorder Brother        mute   Mental illness Brother        he can just fly off   Scoliosis Son    Breast cancer Neg Hx      Social History: no tobacco, no ETOH  Diet: some tea, no coffee  Exercise:  Going to start PT soon  Home BP readings: 146/94 heart rate 75    Wt Readings from Last 3 Encounters:  12/26/23 222 lb (100.7 kg)  10/13/23 221 lb 3.2 oz (100.3 kg)  09/09/23 217 lb (98.4 kg)   BP Readings from Last 3 Encounters:  12/28/23 (!) 150/100  12/26/23 (!) 157/96  11/12/23 132/89  Pulse Readings from Last 3 Encounters:  12/28/23 89  12/26/23 85  11/12/23 (!) 114    Renal function: CrCl cannot be calculated (Patient's most recent lab result is older than the maximum 21 days allowed.).  Past Medical History:  Diagnosis Date   Anemia    CVA (cerebral vascular accident) (HCC)    2020   Epilepsy (HCC)    Fibroids    H/O bacterial infection    H/O mumps    Hyperlipidemia    Hypertension    Seizures (HCC)     Current Outpatient Medications on File Prior to Visit  Medication Sig Dispense Refill   albuterol  (VENTOLIN  HFA) 108 (90 Base) MCG/ACT inhaler Inhale 1-2 puffs into the lungs every 6 (six) hours as needed for wheezing or shortness of breath. 18 g 0   atorvastatin  (LIPITOR) 80 MG tablet Take 1 tablet (80 mg total) by mouth daily. 90 tablet 1   benzonatate  (TESSALON ) 100 MG capsule Take 1 capsule (100 mg total) by mouth every 8 (eight) hours. 21 capsule 0   cetirizine  (ZYRTEC ) 10 MG tablet Take 1 tablet (10 mg total) by mouth daily. 90 tablet 1   chlorthalidone   (HYGROTON ) 25 MG tablet Take 1 tablet (25 mg total) by mouth daily. 90 tablet 1   clopidogrel  (PLAVIX ) 75 MG tablet Take 1 tablet (75 mg total) by mouth daily. 90 tablet 1   diclofenac  Sodium (VOLTAREN ) 1 % GEL Apply 2 g topically 4 (four) times daily. 100 g 2   donepezil  (ARICEPT ) 10 MG tablet Take 1 tablet (10 mg total) by mouth daily. 30 tablet 11   doxycycline  (VIBRAMYCIN ) 100 MG capsule Take 1 capsule (100 mg total) by mouth 2 (two) times daily. 20 capsule 0   FLUoxetine  (PROZAC ) 40 MG capsule Take 1 capsule (40 mg total) by mouth every morning. 30 capsule 1   furosemide  (LASIX ) 20 MG tablet Take 1 tablet (20 mg total) by mouth daily. 90 tablet 1   losartan  (COZAAR ) 50 MG tablet Take 1 tablet (50 mg total) by mouth daily. 90 tablet 1   meloxicam  (MOBIC ) 7.5 MG tablet Take 1 tablet (7.5 mg total) by mouth daily. 30 tablet 0   metoprolol  tartrate (LOPRESSOR ) 50 MG tablet Take 1 tablet (50 mg total) by mouth 2 (two) times daily. 180 tablet 1   oxybutynin  (DITROPAN  XL) 5 MG 24 hr tablet Take 1 tablet (5 mg total) by mouth at bedtime. For overactive bladder 90 tablet 1   prazosin  (MINIPRESS ) 1 MG capsule Take 1 capsule (1 mg total) by mouth at bedtime. 30 capsule 1   tiZANidine  (ZANAFLEX ) 4 MG tablet Take 1 tablet (4 mg total) by mouth every 8 (eight) hours as needed. 60 tablet 1   No current facility-administered medications on file prior to visit.    No Known Allergies  Blood pressure (!) 150/100, pulse 89.   Assessment/Plan: HYPERTENSION CONTROL Vitals:   12/28/23 1307 12/28/23 1309  BP: (!) 150/90 (!) 150/100    The patient's blood pressure is elevated above target today.  In order to address the patient's elevated BP: A new medication was prescribed today.      1. Hypertension -  Essential hypertension Assessment: Blood pressure elevated in clinic today. No home blood pressure readings available Blood pressure in clinic today consistent with readings at other visits She  gets her meds [facility] packed [facility] and reports its helped a lot with her keeping track of her medications.  I did confirm  that all the medications are in the pill pack She denies any headaches or dizziness Reports feeling sleepy after taking her medications but when she takes a short nap she feels fine Doing physical therapy exercises for her knee and back She needs a new blood pressure cuff since she gave her store mom-I will send blood pressure cuff to [facility] where they should be able to fill it under her Medicaid.  Patient given address and phone number to [facility]  Plan: Start amlodipine  5 mg daily A short supply was sent downstairs to [facility] for her to pick up today The remaining should be filled in her med box which should be filled next week Continue chlorthalidone  25mg  daily, losartan  50mg  daily, furosemide  20mg  daily, metoprolol  tartrate 50mg  twice a day Follow-up with Artist next week Follow-up with me at the end of August    Thank you  Elvi Leventhal D Luanne Krzyzanowski, Pharm.D, BCACP, CPP [facility] [address], [place], [place] [address]  Phone: [phone]; Fax: [phone]

## 2023-12-28 NOTE — Patient Instructions (Addendum)
 Start taking amlodipine  5mg  daily. This will be in a pill bottle until your new med box comes Continue taking chlorthalidone  25mg  daily, losartan  50mg  daily, furosemide  20mg  daily, metoprolol  tartrate 50mg  twice a day  Please start checking blood pressure and write down your readings! Bring your log and machine with you to your next visit  Pick up blood pressure cuff from  [facility] - [place], [place] - [address] [place], [place] [address] Phone: [phone]   Your blood pressure goal is < 130/43mmHg    Important lifestyle changes to control high blood pressure  Intervention  Effect on the BP   Weight loss Weight loss is one of the most effective lifestyle changes for controlling blood pressure. If you're overweight or obese, losing even a small amount of weight can help reduce blood pressure.    Blood pressure can decrease by 1 millimeter of mercury (mmHg) with each kilogram (about 2.2 pounds) of weight lost.   Exercise regularly As a general goal, aim for 30 minutes of moderate physical activity every day.    Regular physical activity can lower blood pressure by 5 - 8 mmHg.   Eat a healthy diet Eat a diet rich in whole grains, fruits, vegetables, lean meat, and low-fat dairy products. Limit processed foods, saturated fat, and sweets.    A heart-healthy diet can lower high blood pressure by 10 mmHg.   Reduce salt (sodium) in your diet Aim for 000mg  of sodium each day. Avoid deli meats, canned food, and frozen microwave meals which are high in sodium.     Limiting sodium can reduce blood pressure by 5 mmHg.   Limit alcohol One drink equals 12 ounces of beer, 5 ounces of wine, or 1.5 ounces of 80-proof liquor.    Limiting alcohol to < 1 drink a day for women or < 2 drinks a day for men can help lower blood pressure by about 4 mmHg.   To check your pressure at home you will need to:   Sit up in a chair, with feet flat on the  floor and back supported. Do not cross your ankles or legs. Rest your left arm so that the cuff is about heart level. If the cuff goes on your upper arm, then just relax your arm on the table, arm of the chair, or your lap. If you have a wrist cuff, hold your wrist against your chest at heart level. Place the cuff snugly around your arm, about 1 inch above the crease of your elbow. The cords should be inside the groove of your elbow.  Sit quietly, with the cuff in place, for about 5 minutes. Then press the power button to start a reading. Do not talk or move while the reading is taking place.  Record your readings on a sheet of paper. Although most cuffs have a memory, it is often easier to see a pattern developing when the numbers are all in front of you.  You can repeat the reading after 1-3 minutes if it is recommended.   Make sure your bladder is empty and you have not had caffeine or tobacco within the last 30 minutes   Always bring your blood pressure log with you to your appointments. If you have not brought your monitor in to be double checked for accuracy, please bring it to your next appointment.   You can find a list of validated (accurate) blood pressure cuffs at: validatebp.org

## 2023-12-29 ENCOUNTER — Other Ambulatory Visit: Payer: Self-pay

## 2024-01-05 ENCOUNTER — Encounter: Payer: Self-pay | Admitting: Cardiology

## 2024-01-05 ENCOUNTER — Ambulatory Visit

## 2024-01-05 ENCOUNTER — Ambulatory Visit: Attending: Cardiology | Admitting: Cardiology

## 2024-01-05 ENCOUNTER — Other Ambulatory Visit: Payer: Self-pay

## 2024-01-05 ENCOUNTER — Other Ambulatory Visit (HOSPITAL_COMMUNITY): Payer: Self-pay

## 2024-01-05 VITALS — BP 110/60 | HR 58 | Ht 66.0 in | Wt 215.5 lb

## 2024-01-05 DIAGNOSIS — R002 Palpitations: Secondary | ICD-10-CM | POA: Diagnosis not present

## 2024-01-05 DIAGNOSIS — I493 Ventricular premature depolarization: Secondary | ICD-10-CM | POA: Diagnosis not present

## 2024-01-05 DIAGNOSIS — E782 Mixed hyperlipidemia: Secondary | ICD-10-CM | POA: Insufficient documentation

## 2024-01-05 DIAGNOSIS — I10 Essential (primary) hypertension: Secondary | ICD-10-CM | POA: Insufficient documentation

## 2024-01-05 DIAGNOSIS — R0609 Other forms of dyspnea: Secondary | ICD-10-CM | POA: Diagnosis not present

## 2024-01-05 NOTE — Progress Notes (Unsigned)
 Enrolled patient for a 3 day Zio XT monitor to be mailed to patients home  DOD

## 2024-01-05 NOTE — Patient Instructions (Signed)
 Medication Instructions:  Your physician recommends that you continue on your current medications as directed. Please refer to the Current Medication list given to you today.  *If you need a refill on your cardiac medications before your next appointment, please call your pharmacy*  Lab Work: TO BE DONE FASTING: LIPIDS, LFTS  If you have labs (blood work) drawn today and your tests are completely normal, you will receive your results only by: MyChart Message (if you have MyChart) OR A paper copy in the mail If you have any lab test that is abnormal or we need to change your treatment, we will call you to review the results.  Testing/Procedures: GEOFFRY HEWS- Long Term Monitor Instructions  Your physician has requested you wear a ZIO patch monitor for 3 days.  This is a single patch monitor. [employer] supplies one patch monitor per enrollment. Additional stickers are not available. Please do not apply patch if you will be having a Nuclear Stress Test,  Echocardiogram, Cardiac CT, MRI, or Chest Xray during the period you would be wearing the  monitor. The patch cannot be worn during these tests. You cannot remove and re-apply the  ZIO XT patch monitor.  Your ZIO patch monitor will be mailed 3 day USPS to your address on file. It may take 3-5 days  to receive your monitor after you have been enrolled.  Once you have received your monitor, please review the enclosed instructions. Your monitor  has already been registered assigning a specific monitor serial # to you.  Billing and Patient Assistance Program Information  We have supplied [employer] with any of your insurance information on file for billing purposes. [employer] offers a sliding scale Patient Assistance Program for patients that do not have  insurance, or whose insurance does not completely cover the cost of the ZIO monitor.  You must apply for the Patient Assistance Program to qualify for this discounted rate.  To apply, please call [employer]  at [phone], select option 4, select option 2, ask to apply for  Patient Assistance Program. Meredeth will ask your household income, and how many people  are in your household. They will quote your out-of-pocket cost based on that information.  [employer] will also be able to set up a 60-month, interest-free payment plan if needed.  Applying the monitor   Shave hair from upper left chest.  Hold abrader disc by orange tab. Rub abrader in 40 strokes over the upper left chest as  indicated in your monitor instructions.  Clean area with 4 enclosed alcohol pads. Let dry.  Apply patch as indicated in monitor instructions. Patch will be placed under collarbone on left  side of chest with arrow pointing upward.  Rub patch adhesive wings for 2 minutes. Remove white label marked 1. Remove the white  label marked 2. Rub patch adhesive wings for 2 additional minutes.  While looking in a mirror, press and release button in center of patch. A small green light will  flash 3-4 times. This will be your only indicator that the monitor has been turned on.  Do not shower for the first 24 hours. You may shower after the first 24 hours.  Press the button if you feel a symptom. You will hear a small click. Record Date, Time and  Symptom in the Patient Logbook.  When you are ready to remove the patch, follow instructions on the last 2 pages of Patient  Logbook. Stick patch monitor onto the last page of Patient Logbook.  Place Patient Logbook in the blue and white box. Use locking tab on box and tape box closed  securely. The blue and white box has prepaid postage on it. Please place it in the mailbox as  soon as possible. Your physician should have your test results approximately 7 days after the  monitor has been mailed back to [facility].  Call [facility] Customer Care at [phone] if you have questions regarding  your ZIO XT patch monitor. Call them immediately if you see an orange light  blinking on your  monitor.  If your monitor falls off in less than 4 days, contact our Monitor department at [phone].  If your monitor becomes loose or falls off after 4 days call [employer] at [phone] for  suggestions on securing your monitor   Follow-Up: At [facility], you and your health needs are our priority.  As part of our continuing mission to provide you with exceptional heart care, our providers are all part of one team.  This team includes your primary Cardiologist (physician) and Advanced Practice Providers or APPs (Physician Assistants and Nurse Practitioners) who all work together to provide you with the care you need, when you need it.  Your next appointment:   3-4 month(s)  Provider:   One of our Advanced Practice Providers (APPs): Morse Clause, PA-C  Lamarr Satterfield, NP Miriam Shams, NP  Olivia Pavy, PA-C Josefa Beauvais, NP  Leontine Salen, PA-C Orren Fabry, PA-C  [place], PA-C Ernest Dick, NP  Damien Braver, NP Jon Hails, PA-C  Waddell Donath, PA-C    Dayna Dunn, PA-C  Scott Weaver, PA-C Lum Louis, NP Katlyn West, NP Callie Goodrich, PA-C  Evan Williams, PA-C Sheng Haley, PA-C  Xika Zhao, NP Kathleen Johnson, PA-C    We recommend signing up for the patient portal called MyChart.  Sign up information is provided on this After Visit Summary.  MyChart is used to connect with patients for Virtual Visits (Telemedicine).  Patients are able to view lab/test results, encounter notes, upcoming appointments, etc.  Non-urgent messages can be sent to your provider as well.   To learn more about what you can do with MyChart, go to [email or web address].

## 2024-01-05 NOTE — Progress Notes (Unsigned)
 Cardiology Office Note:   Date:  01/06/2024  ID:  Tina Moss, DOB [birth year], MRN [id number] PCP: Delbert Clam, MD  [facility] Providers Cardiologist:  None    History of Present Illness:    Discussed the use of AI scribe software for clinical note transcription with the patient, who gave verbal consent to proceed.  History of Present Illness Tina Moss is a 53 year old female with coronary artery disease, CVA, and PVCs who presents for follow-up.  She has a history of coronary artery atherosclerosis, aortic atherosclerosis, PVCs, hypertension, hypolipidemia, CVA, seizure disorder, and diabetes. In March 2025, she experienced fatigue, shortness of breath, and palpitations with uncontrolled blood pressure, leading to medication adjustments including the addition of chlorthalidone . A cardiac monitor showed a 12% PVC burden with isolated runs of SVT. Metoprolol  was increased to 75mg .   In May 2025, her blood pressure was 150/100, with concerns about medication confusion and noncompliance. Donepezil , chlorthalidone , furosemide , and metoprolol  were missing from her pill packs. Patient was also not taking Metoprolol . On December 28, 2023, her blood pressure remained elevated, and amlodipine  5 mg was added to her regimen, which now includes chlorthalidone  25 mg, losartan  50 mg, furosemide  20 mg daily, and metoprolol  tartrate 50 mg twice daily.  She experiences significant fatigue, stating she has 'no type of energy' and needs frequent rest. Previously active before her stroke, she now struggles with energy and physical activity. She attempts to stay active by going to [employer] and grocery shopping with her mother but finds herself 'out of breath' and 'tired.'  She experiences shortness of breath, especially when walking or standing for long periods. She uses an inhaler obtained from urgent care after a visit in May when she was diagnosed with a suspected upper respiratory infection  in May. She denies a history of asthma but notes the inhaler helps her breathing 'slow down a little bit.'  Regarding arrhythmia history, she does still feel some episodes of palpitations/rapid heartbeat with chest tightness.  She experiences back pain and numbness in her feet and fingers, with her hand forming painful shapes. She reports numbness in her feet while sitting and talking.  She experiences dizziness, particularly in the morning and when exposed to the sun, describing episodes where her vision goes dark and then light with 'little colors' in her eyes. She ensures she stays hydrated, especially on hot days.  She reports swelling in her ankles, which she has noticed for years and believes runs in her family. She does not regularly check for swelling but is informed by others when it is present.   Studies Reviewed:    EKG:        Cardiac Studies & Procedures   ______________________________________________________________________________________________     ECHOCARDIOGRAM  ECHOCARDIOGRAM COMPLETE 09/02/2023  Narrative ECHOCARDIOGRAM REPORT    Patient Name:   Tina Moss Date of Exam: 09/02/2023 Medical Rec #:  [id number]       Height:       66.0 in Accession #:    [id number]      Weight:       220.6 lb Date of Birth:  March 16, 1971        BSA:          2.085 m Patient Age:    53 years        BP:           154/87 mmHg Patient Gender: F  HR:           94 bpm. Exam Location:  Outpatient  Procedure: 2D Echo, Cardiac Doppler and Color Doppler (Both Spectral and Color Flow Doppler were utilized during procedure).  Indications:    Dyspnea  History:        Patient has prior history of Echocardiogram examinations, most recent 04/05/2018. Risk Factors:Hypertension.  Sonographer:    Jayson Gaskins Referring Phys: [id number] ENOBONG NEWLIN  IMPRESSIONS   1. Left ventricular ejection fraction, by estimation, is 55 to 60%. The left ventricle has normal function.  The left ventricle has no regional wall motion abnormalities. Indeterminate diastolic filling due to E-A fusion. 2. Right ventricular systolic function is normal. The right ventricular size is normal. Tricuspid regurgitation signal is inadequate for assessing PA pressure. 3. The mitral valve is normal in structure. No evidence of mitral valve regurgitation. No evidence of mitral stenosis. 4. The aortic valve is tricuspid. Aortic valve regurgitation is not visualized. No aortic stenosis is present. 5. The inferior vena cava is normal in size with greater than 50% respiratory variability, suggesting right atrial pressure of 3 mmHg.  Comparison(s): No significant change from prior study. Prior images reviewed side by side. Poor quality study, but no major structural abnormalities are identified.  FINDINGS Left Ventricle: Left ventricular ejection fraction, by estimation, is 55 to 60%. The left ventricle has normal function. The left ventricle has no regional wall motion abnormalities. The left ventricular internal cavity size was normal in size. There is borderline concentric left ventricular hypertrophy. Indeterminate diastolic filling due to E-A fusion.  Right Ventricle: The right ventricular size is normal. No increase in right ventricular wall thickness. Right ventricular systolic function is normal. Tricuspid regurgitation signal is inadequate for assessing PA pressure.  Left Atrium: Left atrial size was normal in size.  Right Atrium: Right atrial size was normal in size.  Pericardium: There is no evidence of pericardial effusion.  Mitral Valve: The mitral valve is normal in structure. Mild mitral annular calcification. No evidence of mitral valve regurgitation. No evidence of mitral valve stenosis.  Tricuspid Valve: The tricuspid valve is normal in structure. Tricuspid valve regurgitation is not demonstrated. No evidence of tricuspid stenosis.  Aortic Valve: The aortic valve is tricuspid.  Aortic valve regurgitation is not visualized. No aortic stenosis is present.  Pulmonic Valve: The pulmonic valve was normal in structure. Pulmonic valve regurgitation is not visualized. No evidence of pulmonic stenosis.  Aorta: The aortic root is normal in size and structure.  Venous: The inferior vena cava is normal in size with greater than 50% respiratory variability, suggesting right atrial pressure of 3 mmHg.  IAS/Shunts: No atrial level shunt detected by color flow Doppler.   LEFT VENTRICLE PLAX 2D LVIDd:         3.60 cm LVIDs:         2.00 cm LV PW:         1.10 cm LV IVS:        0.90 cm LVOT diam:     2.00 cm LVOT Area:     3.14 cm    AORTA Ao Root diam: 2.50 cm   SHUNTS Systemic Diam: 2.00 cm  Mihai Croitoru MD Electronically signed by Jerel Balding MD Signature Date/Time: 09/02/2023/8:03:17 PM    Final MONITORS  LONG TERM MONITOR (3-14 DAYS) 10/04/2023  Narrative   Patient had a minimum heart rate of 60 bpm, maximum heart rate of 188 bpm, and average heart rate of 102 bpm. Predominant underlying  rhythm was sinus rhythm. One run of relatively slow NSVT- 18 beats, 158 bpm. Short runs of SVT noted. Isolated PVCs were frequent (12%). No triggered and diary events.  Frequent, asymptomatic PVCs.   CT SCANS  CT CORONARY MORPH W/CTA COR W/SCORE 02/16/2022  Addendum 02/17/2022  2:00 PM ADDENDUM REPORT: 02/17/2022 13:58  EXAM: OVER-READ INTERPRETATION  CT CHEST  The following report is an over-read performed by radiologist Dr. Tanda Lyons of [facility] on 02/17/2022. This over-read does not include interpretation of cardiac or coronary anatomy or pathology. The coronary calcium  score/coronary CTA interpretation by the cardiologist is attached.  COMPARISON:  AP chest 12/10/2019, chest two views 04/04/2018  FINDINGS: Cardiovascular: There are no significant extracardiac vascular findings.  Mediastinum/Nodes: There are no enlarged lymph  nodes within the visualized mediastinum.  Lungs/Pleura: There is again moderate to high-grade elevation of the left hemidiaphragm. There is curvilinear left basilar subsegmental atelectasis associated with a chronic left hemidiaphragm elevation.  Upper abdomen: Mild diverticulosis within the visualized distal transverse colon and proximal descending colon.  Musculoskeletal/Chest wall: Mild-to-moderate multilevel degenerative disc and anterior endplate spurring of the visualized mid to lower thoracic spine.  IMPRESSION: No significant extracardiac findings within the visualized chest.   Electronically Signed By: Tanda Lyons M.D. On: 02/17/2022 13:58  Narrative HISTORY: Chest pain, nonspecific Chest Pain  EXAM: Cardiac/Coronary CT  TECHNIQUE: The patient was scanned on a [employer].  PROTOCOL: A 120 kV prospective scan was triggered in the descending thoracic aorta at 111 HU's. Axial non-contrast 3 mm slices were carried out through the heart. The data set was analyzed on a dedicated work station and scored using the Agatston method. Gantry rotation speed was 250 msecs and collimation was .6 mm. Heart rate was optimized medically and sl NTG was given. The 3D data set was reconstructed in 5% intervals of the 35-75 % of the R-R cycle. Systolic and diastolic phases were analyzed on a dedicated work station using MPR, MIP and VRT modes. The patient received 100mL OMNIPAQUE  IOHEXOL  350 MG/ML SOLN of contrast.  FINDINGS: Coronary calcium  score: The patient's coronary artery calcium  score is 13, which places the patient in the 89th percentile.  Coronary arteries: Normal coronary origins.  Right dominance.  Right Coronary Artery: Normal caliber vessel, gives rise to PDA. Mixed calcified and noncalcified plaque in proximal vessel with 1-24% stenosis.  Left Main Coronary Artery: Normal caliber vessel. Distal left main with mixed calcified and noncalcified  plaque with 1-24% stenosis.  Left Anterior Descending Coronary Artery: Normal caliber vessel. Mixed calcified and noncalcified plaque in proximal vessel with 1-24% stenosis. Gives rise to three diagonal branches. Distal LAD wraps apex.  Left Circumflex Artery: Normal caliber vessel. No significant plaque or stenosis. Gives rise to one small OM branches.  Aorta: Normal size, 30 mm at the mid ascending aorta (level of the PA bifurcation) measured double oblique. Aortic atherosclerosis. No dissection seen in visualized portions of the aorta.  Aortic Valve: No calcifications. Trileaflet.  Other findings:  Normal pulmonary vein drainage into the left atrium.  Normal left atrial appendage without a thrombus.  Normal size of the pulmonary artery.  Normal appearance of the pericardium.  Mild mitral annular calcification.  Significantly elevated left hemidiaphragm.  IMPRESSION: 1. Minimal nonobstructive CAD, CADRADS = 1.  2. Coronary calcium  score of 13. This was 89th percentile for age and sex matched control.  3. Normal coronary origin with right dominance.  4. Aortic atherosclerosis.  5. Significantly elevated left hemidiaphragm.  INTERPRETATION:  CAD-RADS 1: Minimal non-obstructive CAD (1-24%). Consider non-atherosclerotic causes of chest pain. Consider preventive therapy and risk factor modification.  Electronically Signed: By: Shelda Bruckner M.D. On: 02/17/2022 13:27     ______________________________________________________________________________________________       Risk Assessment/Calculations:              Physical Exam:   VS:  BP 104/62   Pulse (!) 58   Ht 5' 6 (1.676 m)   Wt 215 lb 8 oz (97.8 kg)   LMP  (LMP Unknown) Comment: more than a year since last menstrual  SpO2 92%   BMI 34.78 kg/m    Wt Readings from Last 3 Encounters:  01/05/24 215 lb 8 oz (97.8 kg)  12/26/23 222 lb (100.7 kg)  10/13/23 221 lb 3.2 oz (100.3 kg)      Physical Exam Vitals reviewed.  Constitutional:      Appearance: Normal appearance.  HENT:     Head: Normocephalic.     Nose: Nose normal.  Eyes:     Pupils: Pupils are equal, round, and reactive to light.  Cardiovascular:     Rate and Rhythm: Normal rate and regular rhythm.     Pulses: Normal pulses.     Heart sounds: Normal heart sounds. No murmur heard.    No friction rub. No gallop.     Comments: Brief tachycardia, regular rhythm during auscultation. Spontaneous resolution with HR then 60bpm.  Pulmonary:     Effort: Pulmonary effort is normal.     Breath sounds: Normal breath sounds.  Abdominal:     General: Abdomen is flat.  Musculoskeletal:     Right lower leg: No edema.     Left lower leg: No edema.  Skin:    General: Skin is warm and dry.     Capillary Refill: Capillary refill takes less than 2 seconds.  Neurological:     General: No focal deficit present.     Mental Status: She is alert and oriented to person, place, and time.  Psychiatric:        Mood and Affect: Mood normal.        Behavior: Behavior normal.        Thought Content: Thought content normal.        Judgment: Judgment normal.       ASSESSMENT AND PLAN:    Assessment & Plan Coronary artery disease and aortic atherosclerosis with hyperlipidemia 2023 coronary CT with calcium  score 13. Minimal CAD. - Updated lipid panel ordered - Continue Atorvastatin  80mg . With CVA hx, LDL goal 55mg /dL.  PVCs SVT 12% PVC burden on cardiac monitoring with brief runs of SVT. Following this monitor, Metoprolol  was supposed to be increased to 75mg  BID but appears this did not happen with medication confusion. Now taking 50mg  BID. She continues to have some symptoms including palpitations and occasional associated chest pain. On exam today, patient briefly with tachy-arrhythmia, regular with rate ~110bpm. Rhythm terminated spontaneously before 12 lead ECG could be checked. These brief runs of SVT possibly  contributing to shortness of breath and fatigue. - Continue metoprolol  tartrate 50 mg twice daily - Arrange for a 3-day cardiac monitor to re-assess frequency of PVCs and SVT given tachycardia on exam.   Hypertension Previously uncontrolled hypertension now well-controlled at 110/60 mmHg with current regimen of amlodipine , chlorthalidone , losartan , furosemide , and metoprolol . - Continue current antihypertensive regimen: amlodipine , chlorthalidone , losartan , furosemide , and metoprolol  - Attend upcoming pharmD HTN clinic.  Hypolipidemia Elevated LDL cholesterol last checked in  October 2023 with high levels noted. Currently on atorvastatin . No recent labs available. - Order fasting lipid panel - Continue atorvastatin  80mg   History of CVA Cerebrovascular accident with residual effects including fatigue and decreased physical activity, contributing to deconditioning and shortness of breath. - Encourage increased physical activity as tolerated to improve conditioning  Fatigue and shortness of breath Chronic fatigue and shortness of breath likely multifactorial due to deconditioning post-CVA, weight, and possible cardiac rhythm issues. Inhaler use provides some relief for shortness of breath. Dizziness possibly related to blood pressure medications. - Encourage increased physical activity as tolerated - Ensure adequate hydration, especially in hot weather          Signed, Artist Pouch, PA-C

## 2024-01-06 ENCOUNTER — Other Ambulatory Visit (HOSPITAL_COMMUNITY): Payer: Self-pay

## 2024-01-06 ENCOUNTER — Other Ambulatory Visit: Payer: Self-pay

## 2024-01-09 ENCOUNTER — Other Ambulatory Visit: Payer: Self-pay

## 2024-01-10 ENCOUNTER — Other Ambulatory Visit: Payer: Self-pay

## 2024-01-10 ENCOUNTER — Other Ambulatory Visit (HOSPITAL_COMMUNITY): Payer: Self-pay
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
## 2024-01-16 DIAGNOSIS — E782 Mixed hyperlipidemia: Secondary | ICD-10-CM | POA: Diagnosis not present

## 2024-01-16 DIAGNOSIS — I10 Essential (primary) hypertension: Secondary | ICD-10-CM | POA: Diagnosis not present

## 2024-01-17 LAB — LIPID PANEL
Chol/HDL Ratio: 3.3 ratio (ref 0.0–4.4)
Cholesterol, Total: 137 mg/dL (ref 100–199)
HDL: 42 mg/dL (ref >39)
LDL Chol Calc (NIH): 82 mg/dL (ref 0–99)
Triglycerides: 61 mg/dL (ref 0–149)
VLDL Cholesterol Cal: 13 mg/dL (ref 5–40)

## 2024-01-17 LAB — HEPATIC FUNCTION PANEL
ALT: 33 IU/L — ABNORMAL HIGH (ref 0–32)
AST: 20 IU/L (ref 0–40)
Albumin: 4 g/dL (ref 3.8–4.9)
Alkaline Phosphatase: 151 IU/L — ABNORMAL HIGH (ref 44–121)
Bilirubin Total: 0.3 mg/dL (ref 0.0–1.2)
Bilirubin, Direct: 0.11 mg/dL (ref 0.00–0.40)
Total Protein: 6.6 g/dL (ref 6.0–8.5)

## 2024-01-18 ENCOUNTER — Ambulatory Visit: Payer: Self-pay | Admitting: Cardiology

## 2024-01-18 DIAGNOSIS — I493 Ventricular premature depolarization: Secondary | ICD-10-CM

## 2024-01-18 DIAGNOSIS — E782 Mixed hyperlipidemia: Secondary | ICD-10-CM

## 2024-01-19 ENCOUNTER — Ambulatory Visit (INDEPENDENT_AMBULATORY_CARE_PROVIDER_SITE_OTHER): Admitting: Psychiatry

## 2024-01-19 ENCOUNTER — Other Ambulatory Visit: Payer: Self-pay

## 2024-01-19 VITALS — BP 137/88 | HR 79 | Wt 219.4 lb

## 2024-01-19 DIAGNOSIS — F4310 Post-traumatic stress disorder, unspecified: Secondary | ICD-10-CM

## 2024-01-19 DIAGNOSIS — F331 Major depressive disorder, recurrent, moderate: Secondary | ICD-10-CM | POA: Diagnosis not present

## 2024-01-19 DIAGNOSIS — F3289 Other specified depressive episodes: Secondary | ICD-10-CM

## 2024-01-19 DIAGNOSIS — R413 Other amnesia: Secondary | ICD-10-CM

## 2024-01-19 MED ORDER — PRAZOSIN HCL 1 MG PO CAPS
1.0000 mg | ORAL_CAPSULE | Freq: Every day | ORAL | 1 refills | Status: DC
  Filled 2024-01-19 – 2024-01-31 (×2): qty 30, 30d supply, fill #0
  Filled 2024-03-01: qty 30, 30d supply, fill #1
  Filled ????-??-??: fill #0

## 2024-01-19 MED ORDER — FLUOXETINE HCL 40 MG PO CAPS
40.0000 mg | ORAL_CAPSULE | Freq: Every morning | ORAL | 1 refills | Status: DC
  Filled 2024-01-19 – 2024-01-31 (×2): qty 30, 30d supply, fill #0
  Filled 2024-03-01: qty 30, 30d supply, fill #1
  Filled ????-??-??: fill #0

## 2024-01-19 NOTE — Addendum Note (Signed)
 Addended by: CARVIN CROCK on: 01/19/2024 11:52 AM   Modules accepted: Level of Service

## 2024-01-21 ENCOUNTER — Other Ambulatory Visit: Payer: Self-pay

## 2024-01-21 ENCOUNTER — Emergency Department (HOSPITAL_COMMUNITY)

## 2024-01-21 ENCOUNTER — Emergency Department (HOSPITAL_COMMUNITY)
Admission: EM | Admit: 2024-01-21 | Discharge: 2024-01-21 | Disposition: A | Discharge status: Home / Self Care | Attending: Emergency Medicine | Admitting: Emergency Medicine

## 2024-01-21 ENCOUNTER — Encounter (HOSPITAL_COMMUNITY): Payer: Self-pay | Admitting: *Deleted

## 2024-01-21 DIAGNOSIS — M25511 Pain in right shoulder: Secondary | ICD-10-CM | POA: Insufficient documentation

## 2024-01-21 DIAGNOSIS — W01198A Fall on same level from slipping, tripping and stumbling with subsequent striking against other object, initial encounter: Secondary | ICD-10-CM | POA: Insufficient documentation

## 2024-01-21 DIAGNOSIS — S3991XA Unspecified injury of abdomen, initial encounter: Secondary | ICD-10-CM | POA: Diagnosis present

## 2024-01-21 DIAGNOSIS — Z79899 Other long term (current) drug therapy: Secondary | ICD-10-CM | POA: Insufficient documentation

## 2024-01-21 DIAGNOSIS — M542 Cervicalgia: Secondary | ICD-10-CM | POA: Diagnosis not present

## 2024-01-21 DIAGNOSIS — I10 Essential (primary) hypertension: Secondary | ICD-10-CM | POA: Insufficient documentation

## 2024-01-21 DIAGNOSIS — M25551 Pain in right hip: Secondary | ICD-10-CM | POA: Insufficient documentation

## 2024-01-21 DIAGNOSIS — S301XXA Contusion of abdominal wall, initial encounter: Secondary | ICD-10-CM | POA: Diagnosis not present

## 2024-01-21 DIAGNOSIS — S80919A Unspecified superficial injury of unspecified knee, initial encounter: Secondary | ICD-10-CM | POA: Diagnosis not present

## 2024-01-21 DIAGNOSIS — S0990XA Unspecified injury of head, initial encounter: Secondary | ICD-10-CM | POA: Diagnosis not present

## 2024-01-21 DIAGNOSIS — Z7901 Long term (current) use of anticoagulants: Secondary | ICD-10-CM | POA: Diagnosis not present

## 2024-01-21 DIAGNOSIS — R0781 Pleurodynia: Secondary | ICD-10-CM | POA: Insufficient documentation

## 2024-01-21 DIAGNOSIS — W19XXXA Unspecified fall, initial encounter: Secondary | ICD-10-CM

## 2024-01-21 LAB — COMPREHENSIVE METABOLIC PANEL WITH GFR
ALT: 31 U/L (ref 0–44)
AST: 18 U/L (ref 15–41)
Albumin: 3.4 g/dL — ABNORMAL LOW (ref 3.5–5.0)
Alkaline Phosphatase: 117 U/L (ref 38–126)
Anion gap: 8 (ref 5–15)
BUN: 13 mg/dL (ref 6–20)
CO2: 25 mmol/L (ref 22–32)
Calcium: 9.1 mg/dL (ref 8.9–10.3)
Chloride: 107 mmol/L (ref 98–111)
Creatinine, Ser: 0.79 mg/dL (ref 0.44–1.00)
GFR, Estimated: >60 mL/min (ref >60)
Glucose, Bld: 100 mg/dL — ABNORMAL HIGH (ref 70–99)
Potassium: 3.4 mmol/L — ABNORMAL LOW (ref 3.5–5.1)
Sodium: 140 mmol/L (ref 135–145)
Total Bilirubin: 0.5 mg/dL (ref 0.0–1.2)
Total Protein: 7 g/dL (ref 6.5–8.1)

## 2024-01-21 LAB — CBC
HCT: 39.3 % (ref 36.0–46.0)
Hemoglobin: 13.1 g/dL (ref 12.0–15.0)
MCH: 30.3 pg (ref 26.0–34.0)
MCHC: 33.3 g/dL (ref 30.0–36.0)
MCV: 91 fL (ref 80.0–100.0)
Platelets: 196 K/uL (ref 150–400)
RBC: 4.32 MIL/uL (ref 3.87–5.11)
RDW: 12.7 % (ref 11.5–15.5)
WBC: 4.7 K/uL (ref 4.0–10.5)
nRBC: 0 % (ref 0.0–0.2)

## 2024-01-21 MED ORDER — IBUPROFEN 600 MG PO TABS
600.0000 mg | ORAL_TABLET | Freq: Three times a day (TID) | ORAL | 0 refills | Status: DC
  Filled 2024-01-21: qty 15, 5d supply, fill #0

## 2024-01-21 MED ORDER — NAPROXEN 250 MG PO TABS
500.0000 mg | ORAL_TABLET | Freq: Once | ORAL | Status: AC
  Administered 2024-01-21: 500 mg via ORAL
  Filled 2024-01-21: qty 2

## 2024-01-21 MED ORDER — MORPHINE SULFATE (PF) 4 MG/ML IV SOLN
4.0000 mg | Freq: Once | INTRAVENOUS | Status: AC
  Administered 2024-01-21: 4 mg via INTRAVENOUS
  Filled 2024-01-21: qty 1

## 2024-01-21 NOTE — ED Provider Notes (Signed)
 [place] EMERGENCY DEPARTMENT AT [facility] Provider Note   CSN: [id number] Arrival date & time: 01/21/24  1530     Patient presents with: Tina Moss   Tina Moss is a 53 y.o. female.   HPI     53 year old female comes in with chief complaint of mechanical fall.  Patient has history of hypertension, hyperlipidemia, seizures and she is on Plavix .  She was at [employer], trying to get up from sitting position.  She tripped in the process and fell.  Patient fell forward, right-sided and struck her head onto the table.  Patient is now complaining of pain to the right shoulder, neck, rib and hip plus abdomen.  Family at the bedside.  They states that patient had a hard fall.  Patient denies any loss of consciousness.  Prior to Admission medications   Medication Sig Start Date End Date Taking? Authorizing Provider  ibuprofen  (ADVIL ) 600 MG tablet Take 1 tablet (600 mg total) by mouth 3 (three) times daily. 01/21/24  Yes Charlyn Sora, MD  albuterol  (VENTOLIN  HFA) 108 (90 Base) MCG/ACT inhaler Inhale 1-2 puffs into the lungs every 6 (six) hours as needed for wheezing or shortness of breath. 07/13/23   Dreama, [place]  N, FNP  amLODipine  (NORVASC ) 5 MG tablet Take 1 tablet (5 mg total) by mouth daily. 12/28/23   Chandrasekhar, Stanly LABOR, MD  atorvastatin  (LIPITOR) 80 MG tablet Take 1 tablet (80 mg total) by mouth daily. 09/27/23   Newlin, Enobong, MD  benzonatate  (TESSALON ) 100 MG capsule Take 1 capsule (100 mg total) by mouth every 8 (eight) hours. 11/12/23   Raspet, Rocky MARLA, PA-C  Blood Pressure Monitor MISC 1 each by Does not apply route daily. 12/28/23   Santo Stanly LABOR, MD  cetirizine  (ZYRTEC ) 10 MG tablet Take 1 tablet (10 mg total) by mouth daily. 09/27/23   Newlin, Enobong, MD  chlorthalidone  (HYGROTON ) 25 MG tablet Take 1 tablet (25 mg total) by mouth daily. 09/27/23   Newlin, Enobong, MD  clopidogrel  (PLAVIX ) 75 MG tablet Take 1 tablet (75 mg total) by mouth daily. 09/27/23    Newlin, Enobong, MD  diclofenac  Sodium (VOLTAREN ) 1 % GEL Apply 2 g topically 4 (four) times daily. 03/30/23   Gabino Boga, MD  donepezil  (ARICEPT ) 10 MG tablet Take 1 tablet (10 mg total) by mouth daily. 09/27/23   Newlin, Enobong, MD  doxycycline  (VIBRAMYCIN ) 100 MG capsule Take 1 capsule (100 mg total) by mouth 2 (two) times daily. 11/12/23   Raspet, Erin K, PA-C  FLUoxetine  (PROZAC ) 40 MG capsule Take 1 capsule (40 mg total) by mouth every morning. 01/19/24 03/19/24  Chien, Stephanie, MD  furosemide  (LASIX ) 20 MG tablet Take 1 tablet (20 mg total) by mouth daily. 09/27/23   Newlin, Enobong, MD  losartan  (COZAAR ) 50 MG tablet Take 1 tablet (50 mg total) by mouth daily. 09/27/23   Newlin, Enobong, MD  metoprolol  tartrate (LOPRESSOR ) 50 MG tablet Take 1 tablet (50 mg total) by mouth 2 (two) times daily. 09/27/23   Newlin, Enobong, MD  oxybutynin  (DITROPAN  XL) 5 MG 24 hr tablet Take 1 tablet (5 mg total) by mouth at bedtime. For overactive bladder 09/27/23   Delbert Clam, MD  prazosin  (MINIPRESS ) 1 MG capsule Take 1 capsule (1 mg total) by mouth at bedtime. 01/19/24   Graham Krabbe, MD  tiZANidine  (ZANAFLEX ) 4 MG tablet Take 1 tablet (4 mg total) by mouth every 8 (eight) hours as needed. 08/24/23   Newlin, Enobong, MD  Allergies: Patient has no known allergies.    Review of Systems  All other systems reviewed and are negative.   Updated Vital Signs BP (!) 142/99   Pulse 82   Temp 98.8 F (37.1 C)   Resp (!) 22   Ht 5' 6 (1.676 m)   Wt 97.7 kg   LMP  (LMP Unknown) Comment: more than a year since last menstrual  SpO2 97%   BMI 34.78 kg/m   Physical Exam Vitals and nursing note reviewed.  Constitutional:      Appearance: She is well-developed.  Eyes:     Extraocular Movements: Extraocular movements intact.     Pupils: Pupils are equal, round, and reactive to light.  Neck:     Comments: In a c-collar, has midline lower C-spine tenderness Cardiovascular:     Rate and Rhythm:  Normal rate.  Pulmonary:     Effort: Pulmonary effort is normal.  Abdominal:     Palpations: Abdomen is soft.     Tenderness: There is abdominal tenderness.     Comments: Generalized tenderness over the right side, with deep palpation.  No tenderness when patient is not being examined  Musculoskeletal:     Comments: Patient has right hip tenderness with palpation and right scapular tenderness.  Patient able to actively raise her bilateral upper and lower extremities  Otherwise:  Head to toe evaluation shows no hematoma, bleeding of the scalp, no facial abrasions, no spine step offs, crepitus of the chest or neck, no tenderness to palpation of the bilateral upper and lower extremities, no gross deformities, no chest tenderness, no pelvic pain.   Skin:    General: Skin is warm and dry.  Neurological:     Mental Status: She is alert and oriented to person, place, and time.     (all labs ordered are listed, but only abnormal results are displayed) Labs Reviewed  COMPREHENSIVE METABOLIC PANEL WITH GFR - Abnormal; Notable for the following components:      Result Value   Potassium 3.4 (*)    Glucose, Bld 100 (*)    Albumin 3.4 (*)    All other components within normal limits  CBC    EKG: None  Radiology: CT HEAD WO CONTRAST Result Date: 01/21/2024 CLINICAL DATA:  Head trauma, moderate-severe; Polytrauma, blunt EXAM: CT HEAD WITHOUT CONTRAST CT CERVICAL SPINE WITHOUT CONTRAST TECHNIQUE: Multidetector CT imaging of the head and cervical spine was performed following the standard protocol without intravenous contrast. Multiplanar CT image reconstructions of the cervical spine were also generated. RADIATION DOSE REDUCTION: This exam was performed according to the departmental dose-optimization program which includes automated exposure control, adjustment of the mA and/or kV according to patient size and/or use of iterative reconstruction technique. COMPARISON:  None Available. FINDINGS: CT  HEAD FINDINGS Brain: Patchy and confluent areas of decreased attenuation are noted throughout the deep and periventricular white matter of the cerebral hemispheres bilaterally, compatible with chronic microvascular ischemic disease. No evidence of large-territorial acute infarction. No parenchymal hemorrhage. No mass lesion. No extra-axial collection. No mass effect or midline shift. No hydrocephalus. Basilar cisterns are patent. Vascular: No hyperdense vessel. Skull: No acute fracture or focal lesion. Sinuses/Orbits: Paranasal sinuses and mastoid air cells are clear. The orbits are unremarkable. Other: None. CT CERVICAL SPINE FINDINGS Alignment: Normal. Skull base and vertebrae: Multilevel mild degenerative changes of the spine. No acute fracture. No aggressive appearing focal osseous lesion or focal pathologic process. Soft tissues and spinal canal: No prevertebral fluid or swelling.  No visible canal hematoma. Upper chest: Unremarkable. Other: None. IMPRESSION: 1. No acute intracranial abnormality. 2. No acute displaced fracture or traumatic listhesis of the cervical spine. Electronically Signed   By: Morgane  Naveau M.D.   On: 01/21/2024 17:50   CT CERVICAL SPINE WO CONTRAST Result Date: 01/21/2024 CLINICAL DATA:  Head trauma, moderate-severe; Polytrauma, blunt EXAM: CT HEAD WITHOUT CONTRAST CT CERVICAL SPINE WITHOUT CONTRAST TECHNIQUE: Multidetector CT imaging of the head and cervical spine was performed following the standard protocol without intravenous contrast. Multiplanar CT image reconstructions of the cervical spine were also generated. RADIATION DOSE REDUCTION: This exam was performed according to the departmental dose-optimization program which includes automated exposure control, adjustment of the mA and/or kV according to patient size and/or use of iterative reconstruction technique. COMPARISON:  None Available. FINDINGS: CT HEAD FINDINGS Brain: Patchy and confluent areas of decreased attenuation  are noted throughout the deep and periventricular white matter of the cerebral hemispheres bilaterally, compatible with chronic microvascular ischemic disease. No evidence of large-territorial acute infarction. No parenchymal hemorrhage. No mass lesion. No extra-axial collection. No mass effect or midline shift. No hydrocephalus. Basilar cisterns are patent. Vascular: No hyperdense vessel. Skull: No acute fracture or focal lesion. Sinuses/Orbits: Paranasal sinuses and mastoid air cells are clear. The orbits are unremarkable. Other: None. CT CERVICAL SPINE FINDINGS Alignment: Normal. Skull base and vertebrae: Multilevel mild degenerative changes of the spine. No acute fracture. No aggressive appearing focal osseous lesion or focal pathologic process. Soft tissues and spinal canal: No prevertebral fluid or swelling. No visible canal hematoma. Upper chest: Unremarkable. Other: None. IMPRESSION: 1. No acute intracranial abnormality. 2. No acute displaced fracture or traumatic listhesis of the cervical spine. Electronically Signed   By: Morgane  Naveau M.D.   On: 01/21/2024 17:50   DG Shoulder Right Result Date: 01/21/2024 CLINICAL DATA:  Fall, pain. EXAM: RIGHT SHOULDER - 2+ VIEW COMPARISON:  None Available. FINDINGS: No acute osseous or joint abnormality. Visualized right chest is grossly unremarkable. IMPRESSION: No acute findings. Electronically Signed   By: Newell Eke M.D.   On: 01/21/2024 16:44   DG Hip Unilat  With Pelvis 2-3 Views Right Result Date: 01/21/2024 CLINICAL DATA:  Fall, right hip pain. EXAM: DG HIP (WITH OR WITHOUT PELVIS) 2-3V RIGHT COMPARISON:  None Available. FINDINGS: There is no evidence of hip fracture or dislocation. There is no evidence of arthropathy or other focal bone abnormality. IMPRESSION: Negative. Electronically Signed   By: Greig Pique M.D.   On: 01/21/2024 16:43   DG Chest 2 View Result Date: 01/21/2024 CLINICAL DATA:  Fall with right hip pain. EXAM: CHEST - 2 VIEW  COMPARISON:  11/12/2023. FINDINGS: Trachea is midline. Heart size normal. Thoracic aorta is calcified. Chronically elevated left hemidiaphragm. Very minimal streaky atelectasis in the right lung base. Lungs are otherwise clear. IMPRESSION: Minimal streaky atelectasis in right lung base Electronically Signed   By: Newell Eke M.D.   On: 01/21/2024 16:43     Procedures   Medications Ordered in the ED  naproxen  (NAPROSYN ) tablet 500 mg (has no administration in time range)  morphine  (PF) 4 MG/ML injection 4 mg (4 mg Intravenous Given 01/21/24 1804)    Clinical Course as of 01/21/24 1903  Sat Jan 21, 2024  1837 BP(!): 162/98 No hypotension. [AN]    Clinical Course User Index [AN] Charlyn Sora, MD  Medical Decision Making Amount and/or Complexity of Data Reviewed Labs: ordered. Radiology: ordered.  Risk Prescription drug management.   53 year old patient comes in after sustaining what appears to be a mechanical fall. Pertinent past medical includes -hypertension, hyperlipidemia, seizures and Plavix  use. Collateral history provided by patient's family members, who are at the bedside.  Based on my history and exam, differential diagnosis includes: - Traumatic brain injury including intracranial hemorrhage - Long bone fractures - Contusions - Soft tissue injury - Concussion - Liver laceration, renal fracture, intra-abdominal bleeding, abdominal wall bruising  Based on the initial assessment, the following workup was initiated CT scan of the head, C-spine, x-ray of the right upper extremity, chest and pelvis.  I have independently interpreted the following imaging from the perspective of acute trauma: Chest x-ray, CT scan of the brain and the results indicate no evidence of pneumothorax.  Patient has no brain bleed.  I reassessed the patient at 6:15 PM.  I have removed the collar at this time.  On repeat exam, patient remains pain-free unless I  applied deep palpation to the right upper quadrant only.  CBC and CMP are pending at this time.  If the patient's hemoglobin is completely stable, and her abdominal exam remains unchanged, then I do not think we need to CT blunt her.   7:03 PM Abdominal exam repeated again.  Unchanged.  She is stable for discharge at this time.  Will give her ibuprofen  for symptom management.  Return precautions discussed  Final diagnoses:  Fall, initial encounter  Contusion of abdominal wall, initial encounter    ED Discharge Orders          Ordered    ibuprofen  (ADVIL ) 600 MG tablet  3 times daily        01/21/24 1903               Charlyn Sora, MD 01/21/24 1904

## 2024-01-21 NOTE — ED Triage Notes (Signed)
 PT here via [facility] from [employer] where she fell.  She states she was stepping down from the elevated booth when she slipped,on the carpet, hitting the hard floor.  No loc.  Pt now c/o R shoulder pain, R neck pain, R rib and scapula pain and R hip pain.  Pt ao x 4.

## 2024-01-21 NOTE — Discharge Instructions (Signed)
 We saw you in the ER for fall and related right-sided injury. The workup in the ER is overall reassuring.  No evidence of brain bleed, cervical spine injury, and the x-ray does not show any broken bones, collapsed lung.  We suspect that you have contusion and will likely have discomfort over the next 24 to 48 hours.  Return to the emergency room if you start having worsening pain, worsening abdominal pain, shortness of breath, severe dizziness, near fainting or fainting.

## 2024-01-21 NOTE — ED Notes (Signed)
 Patient transported to X-ray

## 2024-01-21 NOTE — ED Notes (Signed)
 Patient stated she was wet, This RN and a NT went in to change her. Bed was dry.

## 2024-01-22 DIAGNOSIS — R002 Palpitations: Secondary | ICD-10-CM | POA: Diagnosis not present

## 2024-01-22 DIAGNOSIS — M791 Myalgia, unspecified site: Secondary | ICD-10-CM | POA: Diagnosis not present

## 2024-01-23 ENCOUNTER — Other Ambulatory Visit: Payer: Self-pay

## 2024-01-23 DIAGNOSIS — H2513 Age-related nuclear cataract, bilateral: Secondary | ICD-10-CM | POA: Diagnosis not present

## 2024-01-24 ENCOUNTER — Ambulatory Visit (INDEPENDENT_AMBULATORY_CARE_PROVIDER_SITE_OTHER): Admitting: Clinical

## 2024-01-24 DIAGNOSIS — F332 Major depressive disorder, recurrent severe without psychotic features: Secondary | ICD-10-CM | POA: Diagnosis not present

## 2024-01-24 NOTE — Progress Notes (Signed)
   THERAPIST PROGRESS NOTE  Session Time: 50 min  Participation Level: Active  Behavioral Response: CasualAlertAnxious  Type of Therapy: Individual Therapy  Treatment Goals addressed: client will engage in at least 80% of scheduled individual psychotherapy sessions  ProgressTowards Goals: Progressing  Interventions: CBT  Summary:  REMINGTYN DEPAOLA is a 53 y.o. female who presents for the scheduled appointment oriented times five, appropriately dressed and friendly. Client denied hallucinations and delusions. Client reported on today things have been about the same. Client reported she is still living at her sisters house. Client reported she has not heard back anything on her disability case. Client reported she has gotten along with her sister but they can disagree. Client reported her sister keeps a busy household which gets overwhelming. Client reported otherwise she recently went out for her daughters birthday dinner. Client reported while at [employer] she fell. Client reported the ambulance had to come help her. Client reported she is looking into a case because they did not have proper signage indicating the steps. Client reported otherwise she still has chronic pain as is. Evidence of progress towards goal:  client reported 1 positive of having her outlets to get away from stress at her home.   Suicidal/Homicidal: Nowithout intent/plan  Therapist Response:  Therapist began the appointment asking the client how she has been doing. Therapist engaged using active listening and positive emotional support. Therapist used cbt to engage and ask her about changes with home, family and her health. Therapist used cbt to encourage the client to advocate for herself and take care of herself. Therapist used CBT ask the client to identify her progress with frequency of use with coping skills with continued practice in her daily activity.      Plan: Return again in 4 weeks.  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  Patient/Guardian was advised Release of Information must be obtained prior to any record release in order to collaborate their care with an outside provider. Patient/Guardian was advised if they have not already done so to contact the registration department to sign all necessary forms in order for us  to release information regarding their care.   Consent: Patient/Guardian gives verbal consent for treatment and assignment of benefits for services provided during this visit. Patient/Guardian expressed understanding and agreed to proceed.   Desira Alessandrini Y Meckenzie Balsley, LCSW 01/24/2024

## 2024-01-25 ENCOUNTER — Other Ambulatory Visit: Payer: Self-pay

## 2024-01-25 ENCOUNTER — Ambulatory Visit: Attending: Cardiology

## 2024-01-25 DIAGNOSIS — I493 Ventricular premature depolarization: Secondary | ICD-10-CM

## 2024-01-25 MED ORDER — METOPROLOL TARTRATE 50 MG PO TABS
75.0000 mg | ORAL_TABLET | Freq: Two times a day (BID) | ORAL | 3 refills | Status: AC
  Filled 2024-01-25 (×2): qty 270, 90d supply, fill #0
  Filled 2024-01-31 – 2024-02-03 (×2): qty 90, 30d supply, fill #0
  Filled 2024-03-01 (×2): qty 90, 30d supply, fill #1
  Filled 2024-03-29 – 2024-04-09 (×4): qty 90, 30d supply, fill #2
  Filled 2024-04-30 – 2024-05-03 (×3): qty 90, 30d supply, fill #3
  Filled 2024-05-28 – 2024-05-30 (×2): qty 90, 30d supply, fill #4
  Filled 2024-07-02 – 2024-07-03 (×3): qty 90, 30d supply, fill #5
  Filled ????-??-??: fill #0

## 2024-01-25 NOTE — Progress Notes (Unsigned)
 Enrolled patient for a 14 day Zio XT monitor to be mailed to patients home  Chandrasekhar to read

## 2024-01-26 ENCOUNTER — Ambulatory Visit: Payer: Self-pay

## 2024-01-26 NOTE — Telephone Encounter (Signed)
 FYI Only or Action Required?: FYI only for provider.  Patient was last seen in primary care on 10/13/2023 by Newlin, Enobong, MD.  Called Nurse Triage reporting Rash and Numbness.  Symptoms began a week ago.  Interventions attempted: Nothing.  Symptoms are: unchanged.  Triage Disposition: See PCP When Office is Open (Within 3 Days)  Patient/caregiver understands and will follow disposition?: Yes, no appointments proceeding to urgent care  Copied from CRM #[id number]. Topic: Clinical - Red Word Triage >> Jan 26, 2024  3:43 PM [name] wrote: Red Word that prompted transfer to Nurse Triage: patient says she has been experiencing a numbness in all her fingers except her pinky on the left hand. She also has a rash of some kind from her elbow down to the back of her hand. Reason for Disposition  Localized rash present > 7 days  Answer Assessment - Initial Assessment Questions 1. APPEARANCE of RASH: What does the rash look like? (e.g., blisters, dry flaky skin, red spots, redness, sores)     Eczema looking. Dry flaky itchy 2. LOCATION: Where is the rash located?      Left hand to elbow 3. NUMBER: How many spots are there?       4. SIZE: How big are the spots? (e.g., inches, cm; or compare to size of pinhead, tip of pen, eraser, pea)      Hand to elbow 5. ONSET: When did the rash start?      One week ago 6. ITCHING: Does the rash itch? If Yes, ask: How bad is the itch?  (Scale 0-10; or none, mild, moderate, severe)     Intense, causing scratch marks on arm 7. PAIN: Does the rash hurt? If Yes, ask: How bad is the pain?  (Scale 0-10; or none, mild, moderate, severe)     Irritated 8. OTHER SYMPTOMS: Do you have any other symptoms? (e.g., fever)      Fingers tingling intermittently  Additional info: No appointments available at clinic until September, patient will proceed to urgent care.  Protocols used: Rash or Redness - Localized-A-AH

## 2024-01-26 NOTE — Telephone Encounter (Signed)
 noted

## 2024-01-30 ENCOUNTER — Other Ambulatory Visit: Payer: Self-pay

## 2024-01-31 ENCOUNTER — Ambulatory Visit: Admitting: Physician Assistant

## 2024-01-31 ENCOUNTER — Other Ambulatory Visit: Payer: Self-pay

## 2024-02-01 ENCOUNTER — Other Ambulatory Visit: Payer: Self-pay

## 2024-02-01 ENCOUNTER — Ambulatory Visit (INDEPENDENT_AMBULATORY_CARE_PROVIDER_SITE_OTHER): Admitting: Clinical

## 2024-02-01 ENCOUNTER — Ambulatory Visit (HOSPITAL_COMMUNITY): Admitting: Clinical

## 2024-02-01 DIAGNOSIS — F331 Major depressive disorder, recurrent, moderate: Secondary | ICD-10-CM

## 2024-02-02 ENCOUNTER — Other Ambulatory Visit: Payer: Self-pay

## 2024-02-03 ENCOUNTER — Other Ambulatory Visit: Payer: Self-pay

## 2024-02-04 DIAGNOSIS — L239 Allergic contact dermatitis, unspecified cause: Secondary | ICD-10-CM | POA: Diagnosis not present

## 2024-02-04 NOTE — Progress Notes (Signed)
   THERAPIST PROGRESS NOTE  Session Time: 60 min  Participation Level: Active  Behavioral Response: CasualAlertAnxious  Type of Therapy: Individual Therapy  Treatment Goals addressed: Tina Moss will practice behavioral activation skills 4 times per week for the next 12 weeks   ProgressTowards Goals: Progressing  Interventions: CBT and Supportive  Summary:  Tina Moss is a 53 y.o. female who presents for the scheduled appointment oriented times five, appropriately dressed and friendly. Client denied hallucinations and delusions. Client reported she has been anxious and a little irritable. Client reported the environment at her sister house where she is living gets to be overwhelming. Client reported there is always children, a lot and gatherings of everyone. Client reported they get to carrying on and they nag at her because she does not drink and socialize with them too much. Client reported otherwise she had spoke with a lawyer about her case with [employer]. Client reported he told her sh doesn't have a strong case and should independently handle the matter if they offer her compensation for the fall. Client reported she wants her additional medical bills to be compensated for. Client reported she also has been thinking about her past. Client reported she see her ex boyfriend out and she wonders if life has treated him just as bad as he treated her. Client reported she partially blames him for why she is how she is. Evidence of progress towards goal:  client reported 1 positive of reporting at minimum that she keeps physical distance away from people who trigger her.   Suicidal/Homicidal: Nowithout intent/plan  Therapist Response:  Therapist began the appointment asking how she has been doing. Therapist engaged with active listening and positive emotional support. Therapist used cbt to engage and give her time to discuss her thoughts and feelings. Therapist used cbt to normalize  thoughts and reactions within reason. Therapist used cbt to continue teaching her about boundaries. Therapist used CBT ask the client to identify her progress with frequency of use with coping skills with continued practice in her daily activity.    Therapist assigned homework to practice self care.   Plan: Return again in 4 weeks.  Diagnosis: mdd, moderate with anxious distress  Collaboration of Care: Patient refused AEB none requested by the client.  Patient/Guardian was advised Release of Information must be obtained prior to any record release in order to collaborate their care with an outside provider. Patient/Guardian was advised if they have not already done so to contact the registration department to sign all necessary forms in order for us  to release information regarding their care.   Consent: Patient/Guardian gives verbal consent for treatment and assignment of benefits for services provided during this visit. Patient/Guardian expressed understanding and agreed to proceed.   Tina Moss Y Aalyiah Camberos, LCSW 02/01/2024

## 2024-02-06 ENCOUNTER — Other Ambulatory Visit: Payer: Self-pay

## 2024-02-10 ENCOUNTER — Other Ambulatory Visit: Payer: Self-pay

## 2024-02-10 ENCOUNTER — Ambulatory Visit: Attending: Cardiology | Admitting: Pharmacist

## 2024-02-10 VITALS — BP 131/84

## 2024-02-10 DIAGNOSIS — I10 Essential (primary) hypertension: Secondary | ICD-10-CM | POA: Insufficient documentation

## 2024-02-10 NOTE — Assessment & Plan Note (Signed)
 Assessment: Blood pressure slightly above goal in clinic today Home readings variable, averaging 137/89 Not resting prior to checking at home Patient's lack of quality sleep, PTSD, living situation, diet are most certainly contributing to her blood pressure She does see a therapist I think she was taking 100 mg of metoprolol  twice daily Her new pill pack does not have any metoprolol  in it, she started this yesterday.  I will advise her that she needs to take 1-1/2 tablets from the bottle twice a day  Plan: Continue chlorthalidone  25mg  daily, losartan  50mg  daily, furosemide  20mg  daily, metoprolol  tartrate 75mg  twice a day, amlodipine  5 mg daily Patient instructed to rest prior to checking blood pressure at home and to recheck after 1 to 2 minutes if initial blood pressure is high Follow-up in clinic in 1 month
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
## 2024-02-10 NOTE — Patient Instructions (Addendum)
 Your blood pressure goal is < 130/36mmHg   Please continue to check blood pressure at home, but please make sure you rest at least 5 min prior to checking. Wait 1 min and then check again.  Continue current medications  Please call me and let me know how much metoprolol  is in your pill pack  Important lifestyle changes to control high blood pressure  Intervention  Effect on the BP   Weight loss Weight loss is one of the most effective lifestyle changes for controlling blood pressure. If you're overweight or obese, losing even a small amount of weight can help reduce blood pressure.    Blood pressure can decrease by 1 millimeter of mercury (mmHg) with each kilogram (about 2.2 pounds) of weight lost.   Exercise regularly As a general goal, aim for 30 minutes of moderate physical activity every day.    Regular physical activity can lower blood pressure by 5 - 8 mmHg.   Eat a healthy diet Eat a diet rich in whole grains, fruits, vegetables, lean meat, and low-fat dairy products. Limit processed foods, saturated fat, and sweets.    A heart-healthy diet can lower high blood pressure by 10 mmHg.   Reduce salt (sodium) in your diet Aim for 000mg  of sodium each day. Avoid deli meats, canned food, and frozen microwave meals which are high in sodium.     Limiting sodium can reduce blood pressure by 5 mmHg.   Limit alcohol One drink equals 12 ounces of beer, 5 ounces of wine, or 1.5 ounces of 80-proof liquor.    Limiting alcohol to < 1 drink a day for women or < 2 drinks a day for men can help lower blood pressure by about 4 mmHg.   To check your pressure at home you will need to:   Sit up in a chair, with feet flat on the floor and back supported. Do not cross your ankles or legs. Rest your left arm so that the cuff is about heart level. If the cuff goes on your upper arm, then just relax your arm on the table, arm of the chair, or your lap. If you have a wrist cuff, hold your  wrist against your chest at heart level. Place the cuff snugly around your arm, about 1 inch above the crease of your elbow. The cords should be inside the groove of your elbow.  Sit quietly, with the cuff in place, for about 5 minutes. Then press the power button to start a reading. Do not talk or move while the reading is taking place.  Record your readings on a sheet of paper. Although most cuffs have a memory, it is often easier to see a pattern developing when the numbers are all in front of you.  You can repeat the reading after 1-3 minutes if it is recommended.   Make sure your bladder is empty and you have not had caffeine or tobacco within the last 30 minutes   Always bring your blood pressure log with you to your appointments. If you have not brought your monitor in to be double checked for accuracy, please bring it to your next appointment.   You can find a list of validated (accurate) blood pressure cuffs at: validatebp.org

## 2024-02-16 DIAGNOSIS — I493 Ventricular premature depolarization: Secondary | ICD-10-CM | POA: Diagnosis not present

## 2024-02-20 ENCOUNTER — Ambulatory Visit: Payer: Self-pay | Admitting: Cardiology

## 2024-02-20 DIAGNOSIS — I493 Ventricular premature depolarization: Secondary | ICD-10-CM

## 2024-02-21 NOTE — Telephone Encounter (Signed)
 Patient called back to say that she take 75mg  of metoprolol  Please advise
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
## 2024-02-27 ENCOUNTER — Other Ambulatory Visit: Payer: Self-pay

## 2024-03-01 ENCOUNTER — Other Ambulatory Visit: Payer: Self-pay

## 2024-03-01 ENCOUNTER — Ambulatory Visit (INDEPENDENT_AMBULATORY_CARE_PROVIDER_SITE_OTHER): Admitting: Psychiatry

## 2024-03-01 DIAGNOSIS — R413 Other amnesia: Secondary | ICD-10-CM | POA: Diagnosis not present

## 2024-03-01 DIAGNOSIS — F331 Major depressive disorder, recurrent, moderate: Secondary | ICD-10-CM | POA: Diagnosis not present

## 2024-03-01 DIAGNOSIS — F4310 Post-traumatic stress disorder, unspecified: Secondary | ICD-10-CM | POA: Diagnosis not present

## 2024-03-01 DIAGNOSIS — F3289 Other specified depressive episodes: Secondary | ICD-10-CM

## 2024-03-01 MED ORDER — PRAZOSIN HCL 1 MG PO CAPS
1.0000 mg | ORAL_CAPSULE | Freq: Every day | ORAL | 2 refills | Status: DC
  Filled 2024-03-29 – 2024-04-03 (×2): qty 30, 30d supply, fill #0
  Filled 2024-04-30 – 2024-05-01 (×2): qty 30, 30d supply, fill #1

## 2024-03-01 MED ORDER — FLUOXETINE HCL 40 MG PO CAPS
40.0000 mg | ORAL_CAPSULE | Freq: Every morning | ORAL | 1 refills | Status: DC
  Filled 2024-03-29 – 2024-04-03 (×2): qty 30, 30d supply, fill #0
  Filled 2024-04-30 – 2024-05-01 (×2): qty 30, 30d supply, fill #1

## 2024-03-02 ENCOUNTER — Other Ambulatory Visit: Payer: Self-pay

## 2024-03-06 ENCOUNTER — Ambulatory Visit (HOSPITAL_COMMUNITY): Admitting: Clinical

## 2024-03-06 ENCOUNTER — Other Ambulatory Visit: Payer: Self-pay

## 2024-03-13 ENCOUNTER — Other Ambulatory Visit: Payer: Self-pay

## 2024-03-13 ENCOUNTER — Other Ambulatory Visit: Payer: Self-pay | Admitting: Family Medicine

## 2024-03-13 MED ORDER — ALBUTEROL SULFATE HFA 108 (90 BASE) MCG/ACT IN AERS
1.0000 | INHALATION_SPRAY | Freq: Four times a day (QID) | RESPIRATORY_TRACT | 0 refills | Status: DC | PRN
  Filled 2024-03-13: qty 18, 25d supply, fill #0

## 2024-03-15 ENCOUNTER — Other Ambulatory Visit: Payer: Self-pay

## 2024-03-16 ENCOUNTER — Ambulatory Visit: Admitting: Pharmacist

## 2024-03-26 ENCOUNTER — Other Ambulatory Visit: Payer: Self-pay | Admitting: Family Medicine

## 2024-03-26 ENCOUNTER — Other Ambulatory Visit: Payer: Self-pay

## 2024-03-26 ENCOUNTER — Ambulatory Visit (HOSPITAL_COMMUNITY): Admitting: Clinical

## 2024-03-26 DIAGNOSIS — E785 Hyperlipidemia, unspecified: Secondary | ICD-10-CM

## 2024-03-26 DIAGNOSIS — I10 Essential (primary) hypertension: Secondary | ICD-10-CM

## 2024-03-26 DIAGNOSIS — Z8673 Personal history of transient ischemic attack (TIA), and cerebral infarction without residual deficits: Secondary | ICD-10-CM

## 2024-03-26 DIAGNOSIS — R053 Chronic cough: Secondary | ICD-10-CM

## 2024-03-26 MED ORDER — OXYBUTYNIN CHLORIDE ER 5 MG PO TB24
5.0000 mg | ORAL_TABLET | Freq: Every day | ORAL | 0 refills | Status: DC
  Filled 2024-03-26: qty 90, 90d supply, fill #0
  Filled 2024-03-29 – 2024-04-03 (×2): qty 30, 30d supply, fill #0
  Filled 2024-04-30 – 2024-05-01 (×2): qty 30, 30d supply, fill #1
  Filled 2024-05-28: qty 30, 30d supply, fill #2

## 2024-03-26 MED ORDER — CETIRIZINE HCL 10 MG PO TABS
10.0000 mg | ORAL_TABLET | Freq: Every day | ORAL | 0 refills | Status: DC
  Filled 2024-03-26 – 2024-04-03 (×3): qty 30, 30d supply, fill #0
  Filled 2024-04-30 – 2024-05-01 (×2): qty 30, 30d supply, fill #1
  Filled 2024-05-28: qty 30, 30d supply, fill #2

## 2024-03-26 MED ORDER — LOSARTAN POTASSIUM 50 MG PO TABS
50.0000 mg | ORAL_TABLET | Freq: Every day | ORAL | 0 refills | Status: DC
  Filled 2024-03-26: qty 90, 90d supply, fill #0
  Filled 2024-03-29 – 2024-04-03 (×2): qty 30, 30d supply, fill #0
  Filled 2024-04-30 – 2024-05-01 (×2): qty 30, 30d supply, fill #1
  Filled 2024-05-28: qty 30, 30d supply, fill #2

## 2024-03-26 MED ORDER — ATORVASTATIN CALCIUM 80 MG PO TABS
80.0000 mg | ORAL_TABLET | Freq: Every day | ORAL | 0 refills | Status: DC
  Filled 2024-03-26: qty 90, 90d supply, fill #0
  Filled 2024-03-29 – 2024-04-03 (×2): qty 30, 30d supply, fill #0
  Filled 2024-04-30: qty 30, 30d supply, fill #1
  Filled 2024-05-28: qty 30, 30d supply, fill #2

## 2024-03-26 MED ORDER — CLOPIDOGREL BISULFATE 75 MG PO TABS
75.0000 mg | ORAL_TABLET | Freq: Every day | ORAL | 0 refills | Status: DC
  Filled 2024-03-26: qty 90, 90d supply, fill #0
  Filled 2024-03-29 – 2024-04-03 (×2): qty 30, 30d supply, fill #0
  Filled 2024-04-30 – 2024-05-01 (×2): qty 30, 30d supply, fill #1
  Filled 2024-05-28: qty 30, 30d supply, fill #2

## 2024-03-26 NOTE — Progress Notes (Deleted)
  Cardiology Office Note   Date:  03/26/2024  ID:  AUDRA KAGEL, DOB [birth year], MRN [id number] PCP: Delbert Clam, MD  [facility] Providers Cardiologist:  None { Click to update primary MD,subspecialty MD or APP then REFRESH:1}    History of Present Illness Tina Moss is a 53 y.o. female with past medical history of coronary artery disease, aortic atherosclerosis, PVCs, hypertension, hyperlipidemia, history of CVA, seizure disorder, diabetes.  Followed by Dr. Toney presents today for 79-month follow-up appointment  Patient previously had CVA in 03/2018.  Echocardiogram showed mild LVH, EF 60 to 65%, no wall motion abnormalities.  TEE showed no evidence of thrombus in the left atrial appendage, no evidence of vegetation, no defect or PFO.  Patient later underwent coronary CTA in 02/2022 that showed minimal nonobstructive CAD, coronary calcium  score 13 (89 percentile).   In 08/2023, patient reported fatigue, shortness of breath, palpitations.  Cardiogram 09/02/2023 showed EF 55-60%, no regional wall motion abnormalities, normal RV systolic function, no significant valvular abnormalities.  Cardiac monitor showed frequent PVCs with 12% burden, short runs of SVT.  She was seen by cardiology in 12/2023 for a follow-up appointment.  She had tachycardia on exam.  3-day monitor showed 10.5% PVC burden.  Her metoprolol  tartrate was increased to 75 mg twice daily.  2 weeks later repeated cardiac monitor that showed 13% PVC burden.  A limited echo was ordered and showed EF ***  Frequent PVCs  - Patient had 12% PVC burden on monitor in 08/2023.  10.5% burden on monitor in 12/2023.  Metoprolol  was increased but had 13% PVC burden on monitor in 01/2024 - Echocardiogram 10/16 showed *** - Continue metoprolol  to tartrate 75 mg twice daily - EP ?  Coronary Artery Disease - Coronary CTA in 02/2022 showed coronary calcium  score 13 (89th percentile), minimal nonobstructive CAD -  - Continue  Plavix  75 mg daily and Lipitor 80 mg daily  History of CVA  - Continue Lipitor 80 mg daily and Plavix  75 mg daily  HLD - Lipid panel from 01/2024 showed LDL 82, HDL 42, triglycerides 61, total cholesterol 173 - Goal LDL less than 55 with history of CVA - She has been referred to the Pharm.D. lipid clinic ***  HTN  -  - Continue amlodipine  5 mg daily, losartan  50 mg daily, metoprolol  tartrate 75 mg twice daily, chlorthalidone  25 mg daily - K3.4, creatinine 0.79 on 8/9.  Ordered repeat BMP  ROS: ***  Studies Reviewed      *** Risk Assessment/Calculations {Does this patient have ATRIAL FIBRILLATION?:[phone]} No BP recorded.  {Refresh Note OR Click here to enter BP  :1}***       Physical Exam VS:  LMP  (LMP Unknown) Comment: more than a year since last menstrual       Wt Readings from Last 3 Encounters:  01/21/24 215 lb 8 oz (97.7 kg)  01/05/24 215 lb 8 oz (97.8 kg)  12/26/23 222 lb (100.7 kg)    GEN: Well nourished, well developed in no acute distress NECK: No JVD; No carotid bruits CARDIAC: ***RRR, no murmurs, rubs, gallops RESPIRATORY:  Clear to auscultation without rales, wheezing or rhonchi  ABDOMEN: Soft, non-tender, non-distended EXTREMITIES:  No edema; No deformity   ASSESSMENT AND PLAN ***    {Are you ordering a CV Procedure (e.g. stress test, cath, DCCV, TEE, etc)?   Press F2        :[phone]}  Dispo: ***  Signed, Rollo FABIENE Louder, PA-C

## 2024-03-27 ENCOUNTER — Other Ambulatory Visit: Payer: Self-pay

## 2024-03-29 ENCOUNTER — Ambulatory Visit (INDEPENDENT_AMBULATORY_CARE_PROVIDER_SITE_OTHER): Admitting: Obstetrics and Gynecology

## 2024-03-29 ENCOUNTER — Other Ambulatory Visit: Payer: Self-pay

## 2024-03-29 ENCOUNTER — Encounter: Payer: Self-pay | Admitting: Obstetrics and Gynecology

## 2024-03-29 ENCOUNTER — Ambulatory Visit (HOSPITAL_COMMUNITY)
Admission: RE | Admit: 2024-03-29 | Discharge: 2024-03-29 | Disposition: A | Discharge status: Home / Self Care | Source: Ambulatory Visit | Attending: Cardiology | Admitting: Cardiology

## 2024-03-29 ENCOUNTER — Other Ambulatory Visit (HOSPITAL_COMMUNITY)
Admission: RE | Admit: 2024-03-29 | Discharge: 2024-03-29 | Disposition: A | Discharge status: Home / Self Care | Source: Ambulatory Visit | Attending: Obstetrics and Gynecology | Admitting: Obstetrics and Gynecology

## 2024-03-29 VITALS — BP 120/78 | HR 86 | Ht 66.5 in | Wt 213.6 lb

## 2024-03-29 DIAGNOSIS — Z01419 Encounter for gynecological examination (general) (routine) without abnormal findings: Secondary | ICD-10-CM | POA: Insufficient documentation

## 2024-03-29 DIAGNOSIS — Z124 Encounter for screening for malignant neoplasm of cervix: Secondary | ICD-10-CM | POA: Diagnosis present

## 2024-03-29 DIAGNOSIS — Z113 Encounter for screening for infections with a predominantly sexual mode of transmission: Secondary | ICD-10-CM | POA: Diagnosis present

## 2024-03-29 DIAGNOSIS — I493 Ventricular premature depolarization: Secondary | ICD-10-CM

## 2024-03-29 LAB — CERVICOVAGINAL ANCILLARY ONLY
Bacterial Vaginitis (gardnerella): NEGATIVE
Candida Glabrata: NEGATIVE
Candida Vaginitis: NEGATIVE
Chlamydia: NEGATIVE
Comment: NEGATIVE
Comment: NEGATIVE
Comment: NEGATIVE
Comment: NEGATIVE
Comment: NEGATIVE
Comment: NORMAL
Neisseria Gonorrhea: NEGATIVE
Trichomonas: NEGATIVE

## 2024-03-29 LAB — ECHOCARDIOGRAM LIMITED
Area-P 1/2: 2.32 cm2
Height: 66.5 in
S' Lateral: 2.3 cm
Weight: 3417.6 [oz_av]

## 2024-03-29 MED ORDER — PERFLUTREN LIPID MICROSPHERE
1.0000 mL | INTRAVENOUS | Status: AC | PRN
  Administered 2024-03-29: 4 mL via INTRAVENOUS

## 2024-03-29 NOTE — Progress Notes (Signed)
 Pt presents for AEX. Last PAP 08/2021 Requesting PAP and STD today   Pt states Her PH is off and she has increased discharge Last mammogram 07/2022 No colonoscopy

## 2024-03-30 ENCOUNTER — Ambulatory Visit: Payer: Self-pay | Admitting: Cardiology

## 2024-03-31 LAB — HEPATITIS C ANTIBODY: Hep C Virus Ab: NONREACTIVE

## 2024-03-31 LAB — RPR, QUANT+TP ABS (REFLEX)
Rapid Plasma Reagin, Quant: 1:1 {titer} — ABNORMAL HIGH
T Pallidum Abs: REACTIVE — AB

## 2024-03-31 LAB — HEPATITIS B SURFACE ANTIGEN: Hepatitis B Surface Ag: NEGATIVE

## 2024-03-31 LAB — RPR: RPR Ser Ql: REACTIVE — AB

## 2024-03-31 LAB — HIV ANTIBODY (ROUTINE TESTING W REFLEX): HIV Screen 4th Generation wRfx: NONREACTIVE

## 2024-04-01 ENCOUNTER — Ambulatory Visit: Payer: Self-pay | Admitting: Obstetrics and Gynecology

## 2024-04-01 DIAGNOSIS — A539 Syphilis, unspecified: Secondary | ICD-10-CM

## 2024-04-02 ENCOUNTER — Other Ambulatory Visit: Payer: Self-pay

## 2024-04-03 ENCOUNTER — Other Ambulatory Visit: Payer: Self-pay

## 2024-04-03 LAB — CYTOLOGY - PAP
Comment: NEGATIVE
Diagnosis: NEGATIVE
High risk HPV: NEGATIVE

## 2024-04-04 ENCOUNTER — Other Ambulatory Visit: Payer: Self-pay

## 2024-04-05 ENCOUNTER — Encounter: Payer: Self-pay | Admitting: Obstetrics and Gynecology

## 2024-04-05 NOTE — Progress Notes (Signed)
   WELL-WOMAN PHYSICAL & PAP Patient name: Tina Moss MRN [id number]  Date of birth: May 20, 1971 Chief Complaint:   Gynecologic Exam  History of Present Illness:   Tina Moss is a 53 y.o. G29P2012 African American female being seen today for a routine well-woman exam.  Current complaints: pH is off and increased vaginal discharge.  PCP: Newlin, Enobong, MD       does desire labs No LMP recorded (lmp unknown). Patient is postmenopausal. The current method of family planning is none.  Last pap 08/2021. Results were: normal Last mammogram: 07/2022. Results were: normal. Family h/o breast cancer: No Last colonoscopy: never had. Family h/o colorectal cancer: No Review of Systems:   Pertinent items are noted in HPI Denies any headaches, blurred vision, fatigue, shortness of breath, chest pain, abdominal pain, abnormal vaginal discharge/itching/odor/irritation, problems with periods, bowel movements, urination, or intercourse unless otherwise stated above. Pertinent History Reviewed:  Reviewed past medical,surgical, social and family history.  Reviewed problem list, medications and allergies. Physical Assessment:   Vitals:   03/29/24 1042 03/29/24 1109  BP: (!) 146/86 120/78  Pulse: 93 86  Weight: 213 lb 9.6 oz (96.9 kg)   Height: 5' 6.5 (1.689 m)   Body mass index is 33.96 kg/m.        Physical Examination:   General appearance - well appearing, and in no distress  Mental status - alert, oriented to person, place, and time  Psych:  She has a normal mood and affect  Skin - warm and dry, normal color, no suspicious lesions noted  Chest - effort normal, all lung fields clear to auscultation bilaterally  Heart - normal rate and regular rhythm  Neck:  midline trachea, no thyromegaly or nodules  Breasts - breasts appear normal, no suspicious masses, no skin or nipple changes or  axillary nodes  Abdomen - soft, nontender, nondistended, no masses or organomegaly  Pelvic - VULVA:  normal appearing vulva with no masses, tenderness or lesions  VAGINA: normal appearing vagina with normal color and discharge, no lesions  CERVIX: normal appearing cervix without discharge or lesions, no CMT  Thin prep pap is done with HR HPV cotesting  UTERUS: uterus is felt to be normal size, shape, consistency and nontender   ADNEXA: No adnexal masses or tenderness noted.  Rectal - deferred  Extremities:  No swelling or varicosities noted  No results found for this or any previous visit (from the past 24 hours).  Assessment & Plan:  1. Well woman exam with routine gynecological exam (Primary) - Cytology - PAP( [place]) - Ambulatory referral to Gastroenterology for colonoscopy  2. Pap smear for cervical cancer screening - Cytology - PAP( [place])  3. Screening examination for STI - Cervicovaginal ancillary only( [place]) - HIV antibody (with reflex) - RPR - Hepatitis C Antibody - Hepatitis B Surface AntiGEN   Labs/procedures today: pap and STI testing  Mammogram needs to schedule for this year Colonoscopy needs to schedule  Orders Placed This Encounter  Procedures   HIV antibody (with reflex)   RPR   Hepatitis C Antibody   Hepatitis B Surface AntiGEN   RPR, quant & T.pallidum antibodies   Ambulatory referral to Gastroenterology    Meds: No orders of the defined types were placed in this encounter.   Follow-up: Return in about 1 year (around 03/29/2025) for Annual Exam.  Ala Cart MSN, CNM 03/29/2024 11:18 AM

## 2024-04-06 ENCOUNTER — Ambulatory Visit: Admitting: Nurse Practitioner

## 2024-04-06 ENCOUNTER — Other Ambulatory Visit: Payer: Self-pay

## 2024-04-06 ENCOUNTER — Ambulatory Visit: Admitting: Cardiology

## 2024-04-09 ENCOUNTER — Other Ambulatory Visit: Payer: Self-pay

## 2024-04-09 ENCOUNTER — Encounter (HOSPITAL_COMMUNITY): Payer: Self-pay

## 2024-04-09 ENCOUNTER — Other Ambulatory Visit (HOSPITAL_COMMUNITY): Payer: Self-pay

## 2024-04-09 ENCOUNTER — Ambulatory Visit (INDEPENDENT_AMBULATORY_CARE_PROVIDER_SITE_OTHER): Admitting: Clinical

## 2024-04-09 DIAGNOSIS — F331 Major depressive disorder, recurrent, moderate: Secondary | ICD-10-CM | POA: Diagnosis not present

## 2024-04-09 MED ORDER — FLUZONE 0.5 ML IM SUSY
0.5000 mL | PREFILLED_SYRINGE | Freq: Once | INTRAMUSCULAR | 0 refills | Status: AC
  Filled 2024-04-09: qty 0.5, 1d supply, fill #0

## 2024-04-09 NOTE — Progress Notes (Signed)
   THERAPIST PROGRESS NOTE Virtual Visit via Video Note  I connected with Ghina K Valli on 04/09/24 at  2:00 PM EDT by a video enabled telemedicine application and verified that I am speaking with the correct person using two identifiers.  Location: Patient: home Provider: office   I discussed the limitations of evaluation and management by telemedicine and the availability of in person appointments. The patient expressed understanding and agreed to proceed.   Follow Up Instructions: I discussed the assessment and treatment plan with the patient. The patient was provided an opportunity to ask questions and all were answered. The patient agreed with the plan and demonstrated an understanding of the instructions.   The patient was advised to call back or seek an in-person evaluation if the symptoms worsen or if the condition fails to improve as anticipated.    Session Time: 16 min  Participation Level: Active  Behavioral Response: CasualAlertEuthymic  Type of Therapy: Individual Therapy  Treatment Goals addressed: client will engage in at last 80% of scheduled individual psychotherapy sessions  ProgressTowards Goals: Progressing  Interventions: CBT and Supportive  Summary:  Zamya K Foulk is a 53 y.o. female who presents for the scheduled appointment oriented times five, appropriately dressed and friendly. Client denied hallucinations and delusions. Client reported she is a little under the weather but she is doing okay. Client reported she was approved for disability. Client reported the downside is due to the government shut down she does not know when she will start to receive payment. Client reported she also has the stress of not having food stamps. Client reported she goes to the food bank but sometimes the food is not fresh. Client reported no other changes. Evidence of progress towards goal:  client reported 1 positive of managing stress.   Suicidal/Homicidal:  Nowithout intent/plan  Therapist Response:  Therapist began the appointment asking the client how she has been doing. Therapist engaged with active listening and positive emotional support. Therapist used cbt to ask the client about any changes and her mood. Therapist used cbt to teach and reinforce her self care practices. Therapist used CBT ask the client to identify her progress with frequency of use with coping skills with continued practice in her daily activity.    Therapist assigned self care.   Plan: Return again in 2 weeks.  Diagnosis: mdd, recurrent episode, moderate with anxious distress  Collaboration of Care: Patient refused AEB none requested.  Patient/Guardian was advised Release of Information must be obtained prior to any record release in order to collaborate their care with an outside provider. Patient/Guardian was advised if they have not already done so to contact the registration department to sign all necessary forms in order for us  to release information regarding their care.   Consent: Patient/Guardian gives verbal consent for treatment and assignment of benefits for services provided during this visit. Patient/Guardian expressed understanding and agreed to proceed.   Dalessandro Baldyga Y Cristopher Ciccarelli, LCSW 04/09/2024

## 2024-04-11 ENCOUNTER — Emergency Department (HOSPITAL_COMMUNITY)

## 2024-04-11 ENCOUNTER — Encounter (HOSPITAL_COMMUNITY): Payer: Self-pay | Admitting: *Deleted

## 2024-04-11 ENCOUNTER — Other Ambulatory Visit: Payer: Self-pay

## 2024-04-11 ENCOUNTER — Emergency Department (HOSPITAL_COMMUNITY)
Admission: EM | Admit: 2024-04-11 | Discharge: 2024-04-12 | Discharge status: Against Medical Advice | Attending: Emergency Medicine | Admitting: Emergency Medicine

## 2024-04-11 ENCOUNTER — Ambulatory Visit: Payer: Self-pay

## 2024-04-11 DIAGNOSIS — R093 Abnormal sputum: Secondary | ICD-10-CM | POA: Insufficient documentation

## 2024-04-11 DIAGNOSIS — R918 Other nonspecific abnormal finding of lung field: Secondary | ICD-10-CM | POA: Diagnosis not present

## 2024-04-11 DIAGNOSIS — R0602 Shortness of breath: Secondary | ICD-10-CM | POA: Insufficient documentation

## 2024-04-11 DIAGNOSIS — Z5321 Procedure and treatment not carried out due to patient leaving prior to being seen by health care provider: Secondary | ICD-10-CM | POA: Diagnosis not present

## 2024-04-11 DIAGNOSIS — J40 Bronchitis, not specified as acute or chronic: Secondary | ICD-10-CM | POA: Diagnosis not present

## 2024-04-11 DIAGNOSIS — R059 Cough, unspecified: Secondary | ICD-10-CM | POA: Diagnosis not present

## 2024-04-11 DIAGNOSIS — R9389 Abnormal findings on diagnostic imaging of other specified body structures: Secondary | ICD-10-CM | POA: Diagnosis not present

## 2024-04-11 LAB — CBC WITH DIFFERENTIAL/PLATELET
Abs Immature Granulocytes: 0.04 K/uL (ref 0.00–0.07)
Basophils Absolute: 0.1 K/uL (ref 0.0–0.1)
Basophils Relative: 1 %
Eosinophils Absolute: 0.1 K/uL (ref 0.0–0.5)
Eosinophils Relative: 3 %
HCT: 39.1 % (ref 36.0–46.0)
Hemoglobin: 12.8 g/dL (ref 12.0–15.0)
Immature Granulocytes: 1 %
Lymphocytes Relative: 24 %
Lymphs Abs: 1.2 K/uL (ref 0.7–4.0)
MCH: 30 pg (ref 26.0–34.0)
MCHC: 32.7 g/dL (ref 30.0–36.0)
MCV: 91.6 fL (ref 80.0–100.0)
Monocytes Absolute: 0.5 K/uL (ref 0.1–1.0)
Monocytes Relative: 11 %
Neutro Abs: 3.2 K/uL (ref 1.7–7.7)
Neutrophils Relative %: 60 %
Platelets: 226 K/uL (ref 150–400)
RBC: 4.27 MIL/uL (ref 3.87–5.11)
RDW: 13.2 % (ref 11.5–15.5)
WBC: 5.2 K/uL (ref 4.0–10.5)
nRBC: 0 % (ref 0.0–0.2)

## 2024-04-11 LAB — COMPREHENSIVE METABOLIC PANEL WITH GFR
ALT: 22 U/L (ref 0–44)
AST: 20 U/L (ref 15–41)
Albumin: 3.5 g/dL (ref 3.5–5.0)
Alkaline Phosphatase: 120 U/L (ref 38–126)
Anion gap: 11 (ref 5–15)
BUN: 13 mg/dL (ref 6–20)
CO2: 22 mmol/L (ref 22–32)
Calcium: 9 mg/dL (ref 8.9–10.3)
Chloride: 107 mmol/L (ref 98–111)
Creatinine, Ser: 0.92 mg/dL (ref 0.44–1.00)
GFR, Estimated: >60 mL/min (ref >60)
Glucose, Bld: 103 mg/dL — ABNORMAL HIGH (ref 70–99)
Potassium: 3.5 mmol/L (ref 3.5–5.1)
Sodium: 140 mmol/L (ref 135–145)
Total Bilirubin: 0.7 mg/dL (ref 0.0–1.2)
Total Protein: 7.3 g/dL (ref 6.5–8.1)

## 2024-04-11 LAB — TROPONIN I (HIGH SENSITIVITY)
Troponin I (High Sensitivity): 4 ng/L (ref <18)
Troponin I (High Sensitivity): 4 ng/L (ref <18)

## 2024-04-11 NOTE — ED Triage Notes (Signed)
 Sob for 3 weeks exertional sob  she has seen her doctor and she has had  inhalers given to her her  productive cough green  sputum  no temp fpr 3 days

## 2024-04-11 NOTE — ED Provider Triage Note (Signed)
 Emergency Medicine Provider Triage Evaluation Note  Tina Moss , a 53 y.o. female  was evaluated in triage.  Pt complains of sob.  Review of Systems  Positive: Exertional sob Negative: LE edema, fever  Physical Exam  BP (!) 151/104 (BP Location: Right Arm)   Pulse 84   Temp 97.8 F (36.6 C) (Oral)   Resp (!) 26   Ht 5' 6 (1.676 m)   Wt 96.9 kg   LMP  (LMP Unknown) Comment: more than a year since last menstrual  SpO2 98%   BMI 34.48 kg/m  Gen:   Awake, no distress   Resp:  Normal effort  MSK:   Moves extremities without difficulty  Other:    Medical Decision Making  Medically screening exam initiated at 3:31 PM.  Appropriate orders placed.  Tina Moss was informed that the remainder of the evaluation will be completed by another provider, this initial triage assessment does not replace that evaluation, and the importance of remaining in the ED until their evaluation is complete.  Patient to ED for progressively worsening SOB on exertion. She feels SOB even turning over in bed and reports chest pain when she is lying down. H/O atrial fib. Using albuterol  without known history of asthma, without relief.   Odell Balls, PA-C 04/11/24 1534

## 2024-04-11 NOTE — Telephone Encounter (Signed)
 FYI Only or Action Required?: FYI only for provider: ED advised.  Patient was last seen in primary care on 10/13/2023 by Newlin, Enobong, MD.  Called Nurse Triage reporting No chief complaint on file..  Symptoms began today.  Interventions attempted: Prescription medications: albuterol .  Symptoms are: gradually worsening.  Triage Disposition: No disposition on file.  Patient/caregiver understands and will follow disposition?:   Copied from CRM [id number]. Topic: Clinical - Red Word Triage >> Apr 11, 2024  2:29 PM Travis F wrote: Red Word that prompted transfer to Nurse Triage: Shortness of breath. Patient is calling in wanting to request medication to help open her airways. She is currently experiencing shortness of breath and difficulty breathing. Reason for Disposition  SEVERE difficulty breathing (e.g., struggling for each breath, speaks in single words)  Answer Assessment - Initial Assessment Questions Offered to call 911, patient declines and reports sister will drive to ED when she returns from the school. Nurse again advised to call 911, patient declined again. Nurse advised ED now.  1. RESPIRATORY STATUS: Describe your breathing? (e.g., wheezing, shortness of breath, unable to speak, severe coughing)      Rescue inhaler took it 30 minutes ago, not working Denies dizziness, faint, chest pain  Walking makes worse, audible shortness of breath, single words phrases with pauses while at rest.  Nurse unable to complete triage, advised 911.  Protocols used: Breathing Difficulty-A-AH

## 2024-04-11 NOTE — ED Triage Notes (Signed)
 PT arrives via POV. She reports sob and mild productive cough over the past 3 days. Pt arrives AxOx4.

## 2024-04-11 NOTE — ED Notes (Signed)
 Pt left stating she will look at her results on MyChart and go to urgent care in the morning

## 2024-04-11 NOTE — Telephone Encounter (Signed)
 Routing to PCP for review.  Patient has prescription for Albuterol  inhaler but is needing refill.

## 2024-04-12 ENCOUNTER — Emergency Department (HOSPITAL_COMMUNITY)

## 2024-04-12 ENCOUNTER — Other Ambulatory Visit: Payer: Self-pay

## 2024-04-12 ENCOUNTER — Emergency Department (HOSPITAL_COMMUNITY)
Admission: EM | Admit: 2024-04-12 | Discharge: 2024-04-12 | Disposition: A | Discharge status: Home / Self Care | Source: Home / Self Care | Attending: Emergency Medicine | Admitting: Emergency Medicine

## 2024-04-12 DIAGNOSIS — J40 Bronchitis, not specified as acute or chronic: Secondary | ICD-10-CM | POA: Insufficient documentation

## 2024-04-12 DIAGNOSIS — J9811 Atelectasis: Secondary | ICD-10-CM | POA: Diagnosis not present

## 2024-04-12 DIAGNOSIS — R0602 Shortness of breath: Secondary | ICD-10-CM | POA: Diagnosis not present

## 2024-04-12 DIAGNOSIS — R9389 Abnormal findings on diagnostic imaging of other specified body structures: Secondary | ICD-10-CM | POA: Diagnosis not present

## 2024-04-12 LAB — CBC
HCT: 39.4 % (ref 36.0–46.0)
Hemoglobin: 12.5 g/dL (ref 12.0–15.0)
MCH: 28.7 pg (ref 26.0–34.0)
MCHC: 31.7 g/dL (ref 30.0–36.0)
MCV: 90.6 fL (ref 80.0–100.0)
Platelets: 250 K/uL (ref 150–400)
RBC: 4.35 MIL/uL (ref 3.87–5.11)
RDW: 13.1 % (ref 11.5–15.5)
WBC: 5 K/uL (ref 4.0–10.5)
nRBC: 0 % (ref 0.0–0.2)

## 2024-04-12 LAB — BASIC METABOLIC PANEL WITH GFR
Anion gap: 12 (ref 5–15)
BUN: 14 mg/dL (ref 6–20)
CO2: 24 mmol/L (ref 22–32)
Calcium: 9.3 mg/dL (ref 8.9–10.3)
Chloride: 107 mmol/L (ref 98–111)
Creatinine, Ser: 0.65 mg/dL (ref 0.44–1.00)
GFR, Estimated: >60 mL/min (ref >60)
Glucose, Bld: 94 mg/dL (ref 70–99)
Potassium: 3.6 mmol/L (ref 3.5–5.1)
Sodium: 143 mmol/L (ref 135–145)

## 2024-04-12 LAB — PRO BRAIN NATRIURETIC PEPTIDE: Pro Brain Natriuretic Peptide: 134 pg/mL (ref <300.0)

## 2024-04-12 LAB — TROPONIN T, HIGH SENSITIVITY: Troponin T High Sensitivity: <15 ng/L (ref 0–19)

## 2024-04-12 LAB — D-DIMER, QUANTITATIVE: D-Dimer, Quant: 0.29 ug{FEU}/mL (ref 0.00–0.50)

## 2024-04-12 MED ORDER — ALBUTEROL SULFATE HFA 108 (90 BASE) MCG/ACT IN AERS
1.0000 | INHALATION_SPRAY | Freq: Four times a day (QID) | RESPIRATORY_TRACT | 2 refills | Status: AC | PRN
  Filled 2024-04-12: qty 18, 25d supply, fill #0

## 2024-04-12 MED ORDER — IPRATROPIUM-ALBUTEROL 0.5-2.5 (3) MG/3ML IN SOLN
3.0000 mL | Freq: Once | RESPIRATORY_TRACT | Status: AC
  Administered 2024-04-12: 3 mL via RESPIRATORY_TRACT
  Filled 2024-04-12: qty 3

## 2024-04-12 MED ORDER — DOXYCYCLINE HYCLATE 100 MG PO CAPS
100.0000 mg | ORAL_CAPSULE | Freq: Two times a day (BID) | ORAL | 0 refills | Status: DC
  Filled 2024-04-12: qty 14, 7d supply, fill #0

## 2024-04-12 NOTE — Discharge Instructions (Signed)
 We are putting you on antibiotics to help treat an atypical infection causing your cough and shortness of breath.  Follow-up closely with your primary care provider.  Return to the ER for any new or worsening symptoms.

## 2024-04-12 NOTE — Addendum Note (Signed)
 Addended by: Manual Navarra on: 04/12/2024 05:20 PM   Modules accepted: Orders

## 2024-04-12 NOTE — ED Provider Notes (Signed)
 [place] EMERGENCY DEPARTMENT AT [facility] Provider Note   CSN: [id number] Arrival date & time: 04/12/24  1045     Patient presents with: Shortness of Breath   Tina Moss is a 53 y.o. female.   HPI 53 year old female presents with shortness of breath and cough.  She has had this cough that is occasionally productive of different types of sputum for the past month or so.  She has also been short of breath, primarily on exertion, for the same time.  She also gets short of breath and has more cough when she lays flat.  Occasional leg swelling bilaterally.  Sometimes she will get some chest pain.  No fevers that she knows of.  She went to [facility] but left without being seen yesterday.  She went to urgent care today where she received a breathing treatment with minimal improvement in her symptoms and was told to come to the emergency department.  She does not smoke.  No history of asthma.  Prior to Admission medications   Medication Sig Start Date End Date Taking? Authorizing Provider  doxycycline  (VIBRAMYCIN ) 100 MG capsule Take 1 capsule (100 mg total) by mouth 2 (two) times daily. 04/12/24  Yes Freddi Hamilton, MD  albuterol  (VENTOLIN  HFA) 108 (90 Base) MCG/ACT inhaler Inhale 1-2 puffs into the lungs every 6 (six) hours as needed for wheezing or shortness of breath. 03/13/24   Newlin, Enobong, MD  amLODipine  (NORVASC ) 5 MG tablet Take 1 tablet (5 mg total) by mouth daily. 12/28/23   Chandrasekhar, Mahesh A, MD  atorvastatin  (LIPITOR) 80 MG tablet Take 1 tablet (80 mg total) by mouth daily.Must have office visit for refills 03/26/24   Newlin, Enobong, MD  benzonatate  (TESSALON ) 100 MG capsule Take 1 capsule (100 mg total) by mouth every 8 (eight) hours. 11/12/23   Raspet, Rocky MARLA, PA-C  Blood Pressure Monitor MISC 1 each by Does not apply route daily. 12/28/23   Santo Stanly LABOR, MD  cetirizine  (ZYRTEC ) 10 MG tablet Take 1 tablet (10 mg total) by mouth daily.Must have  office visit for refills 03/26/24   Newlin, Enobong, MD  chlorthalidone  (HYGROTON ) 25 MG tablet Take 1 tablet (25 mg total) by mouth daily. 09/27/23   Newlin, Enobong, MD  clopidogrel  (PLAVIX ) 75 MG tablet Take 1 tablet (75 mg total) by mouth daily.Must have office visit for refills 03/26/24   Delbert Clam, MD  diclofenac  Sodium (VOLTAREN ) 1 % GEL Apply 2 g topically 4 (four) times daily. 03/30/23   Gabino Boga, MD  donepezil  (ARICEPT ) 10 MG tablet Take 1 tablet (10 mg total) by mouth daily. 09/27/23   Newlin, Enobong, MD  FLUoxetine  (PROZAC ) 40 MG capsule Take 1 capsule (40 mg total) by mouth every morning. 04/06/24 06/05/24  Chien, Stephanie, MD  furosemide  (LASIX ) 20 MG tablet Take 1 tablet (20 mg total) by mouth daily. 09/27/23   Newlin, Enobong, MD  ibuprofen  (ADVIL ) 600 MG tablet Take 1 tablet (600 mg total) by mouth 3 (three) times daily. 01/21/24   Charlyn Sora, MD  losartan  (COZAAR ) 50 MG tablet Take 1 tablet (50 mg total) by mouth daily.Must have office visit for refills 03/26/24   Newlin, Enobong, MD  metoprolol  tartrate (LOPRESSOR ) 50 MG tablet Take 1.5 tablets (75 mg total) by mouth 2 (two) times daily. 01/25/24 05/09/24  Trudy Birmingham, PA-C  oxybutynin  (DITROPAN  XL) 5 MG 24 hr tablet Take 1 tablet (5 mg total) by mouth at bedtime. For overactive bladder.Must have office visit for  refills 03/26/24   Newlin, Enobong, MD  prazosin  (MINIPRESS ) 1 MG capsule Take 1 capsule (1 mg total) by mouth at bedtime. 03/01/24 05/30/24  Chien, Stephanie, MD  tiZANidine  (ZANAFLEX ) 4 MG tablet Take 1 tablet (4 mg total) by mouth every 8 (eight) hours as needed. 08/24/23   Newlin, Enobong, MD  triamcinolone  cream (KENALOG ) 0.1 % Apply 1 Application topically 2 (two) times daily. 02/04/24 08/02/24  [provider]    Allergies: Patient has no known allergies.    Review of Systems  Constitutional:  Negative for fever.  Respiratory:  Positive for cough and shortness of breath.    Cardiovascular:  Positive for chest pain and leg swelling.    Updated Vital Signs BP (!) 143/106 (BP Location: Left Arm)   Pulse 77   Temp 97.8 F (36.6 C) (Oral)   Resp (!) 22   Ht 5' 6 (1.676 m)   Wt 96.6 kg   LMP  (LMP Unknown) Comment: more than a year since last menstrual  SpO2 96%   BMI 34.38 kg/m   Physical Exam Vitals and nursing note reviewed.  Constitutional:      Appearance: She is well-developed.  HENT:     Head: Normocephalic and atraumatic.  Cardiovascular:     Rate and Rhythm: Normal rate and regular rhythm.     Heart sounds: Normal heart sounds.  Pulmonary:     Effort: Pulmonary effort is normal. No accessory muscle usage or respiratory distress.     Breath sounds: No wheezing.     Comments: Coarse breath sounds bilaterally Abdominal:     Palpations: Abdomen is soft.     Tenderness: There is no abdominal tenderness.  Musculoskeletal:     Comments: No pitting edema to lower legs/ankles  Skin:    General: Skin is warm and dry.  Neurological:     Mental Status: She is alert.     (all labs ordered are listed, but only abnormal results are displayed) Labs Reviewed  BASIC METABOLIC PANEL WITH GFR  CBC  D-DIMER, QUANTITATIVE  PRO BRAIN NATRIURETIC PEPTIDE  TROPONIN T, HIGH SENSITIVITY    EKG: EKG Interpretation Date/Time:  Thursday April 12 2024 14:03:03 EDT Ventricular Rate:  59 PR Interval:  180 QRS Duration:  94 QT Interval:  474 QTC Calculation: 470 R Axis:   56  Text Interpretation: Sinus rhythm Right atrial enlargement Borderline T wave abnormalities similar to Apr 11 2024 Confirmed by Freddi Hamilton [phone]) on 04/12/2024 2:28:27 PM  Radiology: DG Chest 2 View Result Date: 04/12/2024 EXAM: 2 VIEW(S) XRAY OF THE CHEST 04/12/2024 01:40:00 PM COMPARISON: Yesterday. CLINICAL HISTORY: sob sob FINDINGS: LUNGS AND PLEURA: Stable elevated left hemidiaphragm with minimal adjacent subsegmental atelectasis. No pulmonary edema. No pleural  effusion. No pneumothorax. HEART AND MEDIASTINUM: No acute abnormality of the cardiac and mediastinal silhouettes. BONES AND SOFT TISSUES: No acute osseous abnormality. IMPRESSION: 1. Stable elevated left hemidiaphragm. 2. Minimal adjacent subsegmental atelectasis. Electronically signed by: Lynwood Seip MD 04/12/2024 02:04 PM EDT RP Workstation: [id number]   DG Chest 2 View Result Date: 04/11/2024 CLINICAL DATA:  Shortness of breath for 3 weeks. EXAM: CHEST - 2 VIEW COMPARISON:  Radiograph 01/21/2024 FINDINGS: Chronic elevation of left hemidiaphragm with adjacent atelectasis/scarring. Stable heart size and mediastinal contours. No focal airspace disease, pleural effusion or pneumothorax. No pulmonary edema. No acute osseous findings. IMPRESSION: No acute findings. Chronic elevation of left hemidiaphragm with adjacent atelectasis/scarring. Electronically Signed   By: Andrea Gasman M.D.   On: 04/11/2024 17:03  Procedures   Medications Ordered in the ED  ipratropium-albuterol  (DUONEB) 0.5-2.5 (3) MG/3ML nebulizer solution 3 mL (3 mLs Nebulization Given 04/12/24 1420)                                     Medical Decision Making Amount and/or Complexity of Data Reviewed External Data Reviewed: notes.    Details: Urgent care notes from today Labs: ordered. Radiology: ordered and independent interpretation performed.    Details: No lobar pneumonia ECG/medicine tests: ordered and independent interpretation performed.    Details: No STEMI  Risk Prescription drug management.   No pneumonia seen on x-ray.  Her labs are reassuring including negative D-dimer, troponin, BNP.  She had 2 negative troponins yesterday, I do not think further troponins are needed.  This is probably an atypical infection, and given the length of time of symptoms we will put on some antibiotics.  Otherwise, no overt wheezing, no history of asthma or COPD to suggest that steroids would be particularly helpful.  Will  refer back to her PCP, but otherwise I have low suspicion of ACS, PE, CHF, etc.  Will discharge home with return precautions.     Final diagnoses:  Bronchitis    ED Discharge Orders          Ordered    doxycycline  (VIBRAMYCIN ) 100 MG capsule  2 times daily        04/12/24 1527               Freddi Hamilton, MD 04/12/24 1529

## 2024-04-12 NOTE — ED Triage Notes (Signed)
 C/o SOB for 1 month. Tried being seen yesterday at [facility] and left before being dc'ed. Has been using an inhaler for this. Patient speaking in full sentences. Denies any other symptoms.

## 2024-04-12 NOTE — Telephone Encounter (Signed)
 Prescription has been sent to pharmacy.

## 2024-04-13 NOTE — Telephone Encounter (Signed)
 Patient has been informed.

## 2024-04-16 ENCOUNTER — Encounter: Payer: Self-pay | Admitting: Radiology

## 2024-04-20 ENCOUNTER — Encounter: Payer: Self-pay | Admitting: *Deleted

## 2024-04-22 NOTE — Progress Notes (Signed)
 Patient ID: Tina Moss                 DOB: 1970/06/20                      MRN: [id number]      HPI: Tina Moss is a 53 y.o. female referred by Dr. Santo to HTN clinic. PMH is significant for HTN, memory impairment, LM cerebral artery stroke. She was seen by Dr. Santo 09/09/23, where medication compliance was questioned and blood pressure was elevated 140/92.    09/27/2023 HTN visit: Compliance seem to be the biggest issue (BP 134/90 with a heart rate of 91).  She was transitioned to pill packs with [name] and provided written information on all the medication she takes and why she is taking them. She was instructed to keep her medications on her bed or her dresser, encouraged her not to skip doses, and asked to check her blood pressure daily and bring her log and monitor with her.  10/17/23 HTN clinic visit: She did not bring her BP log or medication. She did call me after the visit and we determined that she was not taking Donepezil , Chlorthalidone , Furosemide , Metoprolol . They had not yet been added to her pill pack. She found her pill bottles and put them with her pillpack. She was supposed to follow up on 5/29 but canceled her last few visits.   12/28/23 HTN visit: Her BP in clinic was 150/100. No home readings. All her medications were in her pill packs. Amlodipine  5mg  daily was added. She saw Artist 01/05/24. BP was much better in clinic, 110/60. Metoprolol  was increased to 75mg  BID due to monitor results showing high PVC burden.   Patient was last seen in HTN clinic on 02/10/24. There was some question about how much metoprolol  she had been taking.  I spoke with [facility] and they noted that since metoprolol  needed to be split, it cannot be added to her pill pack. Patient advised on correct dose. Her home cuff was found to be accurate.  Home blood pressure was averaging 137/89.  Although it did not sound as though she is always resting or checking a second reading.   Patient  went to the ED for exertional SOB lasting 3 weeks on 10/29, but left stating she would review labs on MyChart and would go to urgent care in the morning.  Patient reports to clinic today. She stated that she feels proud of herself for losing some weight and limiting her stress. Overall she feels that she has been doing well other than eating ~5 [employer] ice cream bars and fried pork chop with gravy last night. She did not bring her BP cuff or medications to clinic, but stated she would call when she gets home with her readings. She stated that she does not have many episodes of dizziness or lightheadedness, but did last night after she ate pork chop and stated that her sister cooks with a lot of salt. She also experienced an episode of nausea/vomiting and feeling hot about a month ago that resolved within an hour, but stated she thought it may have been because she took her medications too close together. She also stated that she missed BP meds when she got sick around 10/30 when she was sick with bronchitis, but often does not forget to take her medications.  She has been taking just 50 mg twice daily of the metoprolol  tartrate, which is less than  the prescribed dose. The option to switch metoprolol  to 3-25 mg tablets was offered, but the patient ultimately decided to take 1 and 1/2 of the 50 mg tablets for a total dose of 75 mg twice daily. It was recommended that the patient purchase a pill-cutter to assist with halving the tablets.   Clinic BP: 132/100 and 136/98 HR 94 (ate pork chop this morning right before she came in)  Current HTN meds: chlorthalidone  25mg  daily, losartan  50mg  daily, furosemide  20mg  daily, metoprolol  tartrate 75mg  twice a day (was taking 50 mg twice a day), amlodipine  5 mg daily, prazosin  1 mg daily (PTSD indication but also lowers BP) Previously tried:  BP goal: <130/80  Family History:  Family History  Problem Relation Age of Onset   Hypertension Mother    Hypertension Sister     Arthritis Sister        knees   Mental illness Daughter        ? bipolar   Hypertension Brother    Deafness Brother    Speech disorder Brother        mute   Mental illness Brother        he can just fly off   Scoliosis Son    Breast cancer Neg Hx     Social History: no tobacco, no ETOH  Diet: some tea, no coffee Fried chicken, pork and beans, salad, cookies, candy  Exercise:  YouTube at-home exercises focusing on legs and arms (3-4 days/week)  Home BP readings:     Wt Readings from Last 3 Encounters:  04/12/24 213 lb (96.6 kg)  04/11/24 213 lb 10 oz (96.9 kg)  03/29/24 213 lb 9.6 oz (96.9 kg)   BP Readings from Last 3 Encounters:  04/23/24 (!) 136/98  04/12/24 (!) 177/109  04/11/24 (!) 168/110   Pulse Readings from Last 3 Encounters:  04/23/24 94  04/12/24 76  04/11/24 84    Renal function: Estimated Creatinine Clearance: 94.2 mL/min (by C-G formula based on SCr of 0.65 mg/dL).  Past Medical History:  Diagnosis Date   Anemia    CVA (cerebral vascular accident) (HCC)    2020   Epilepsy (HCC)    Fibroids    H/O bacterial infection    H/O mumps    Hyperlipidemia    Hypertension    Seizures (HCC)     Current Outpatient Medications on File Prior to Visit  Medication Sig Dispense Refill   albuterol  (VENTOLIN  HFA) 108 (90 Base) MCG/ACT inhaler Inhale 1-2 puffs into the lungs every 6 (six) hours as needed for wheezing or shortness of breath. 18 g 2   amLODipine  (NORVASC ) 5 MG tablet Take 1 tablet (5 mg total) by mouth daily. 90 tablet 3   atorvastatin  (LIPITOR) 80 MG tablet Take 1 tablet (80 mg total) by mouth daily.Must have office visit for refills 90 tablet 0   benzonatate  (TESSALON ) 100 MG capsule Take 1 capsule (100 mg total) by mouth every 8 (eight) hours. 21 capsule 0   Blood Pressure Monitor MISC 1 each by Does not apply route daily. 1 each 0   cetirizine  (ZYRTEC ) 10 MG tablet Take 1 tablet (10 mg total) by mouth daily.Must have office visit for  refills 90 tablet 0   chlorthalidone  (HYGROTON ) 25 MG tablet Take 1 tablet (25 mg total) by mouth daily. 90 tablet 1   clopidogrel  (PLAVIX ) 75 MG tablet Take 1 tablet (75 mg total) by mouth daily.Must have office visit for refills 90 tablet 0  diclofenac  Sodium (VOLTAREN ) 1 % GEL Apply 2 g topically 4 (four) times daily. 100 g 2   donepezil  (ARICEPT ) 10 MG tablet Take 1 tablet (10 mg total) by mouth daily. 30 tablet 11   FLUoxetine  (PROZAC ) 40 MG capsule Take 1 capsule (40 mg total) by mouth every morning. 30 capsule 1   furosemide  (LASIX ) 20 MG tablet Take 1 tablet (20 mg total) by mouth daily. 90 tablet 1   losartan  (COZAAR ) 50 MG tablet Take 1 tablet (50 mg total) by mouth daily.Must have office visit for refills 90 tablet 0   metoprolol  tartrate (LOPRESSOR ) 50 MG tablet Take 1.5 tablets (75 mg total) by mouth 2 (two) times daily. 270 tablet 3   oxybutynin  (DITROPAN  XL) 5 MG 24 hr tablet Take 1 tablet (5 mg total) by mouth at bedtime. For overactive bladder.Must have office visit for refills 90 tablet 0   prazosin  (MINIPRESS ) 1 MG capsule Take 1 capsule (1 mg total) by mouth at bedtime. 30 capsule 2   tiZANidine  (ZANAFLEX ) 4 MG tablet Take 1 tablet (4 mg total) by mouth every 8 (eight) hours as needed. 60 tablet 1   triamcinolone  cream (KENALOG ) 0.1 % Apply 1 Application topically 2 (two) times daily.     No current facility-administered medications on file prior to visit.    No Known Allergies  Blood pressure (!) 136/98, pulse 94.   Assessment/Plan: HYPERTENSION CONTROL Vitals:   04/23/24 1005 04/23/24 1007  BP: (!) 132/100 (!) 136/98    The patient's blood pressure is elevated above target today.  In order to address the patient's elevated BP: A current anti-hypertensive medication was adjusted today.      1. Hypertension -  Essential hypertension Assessment: Patient's in-clinic BP was 132/100 and 136/98, which is above goal of <130/80 Patient stated she ate fried pork  chop this morning prior to coming to clinic  Patient stated she rarely misses any doses of her BP medications Patient stated she has been taking just 1 tablet of the metoprolol  tartrate 50 mg twice daily, which is decreased from her prescribed dose of 75 mg  Patient was unable to recall at-home BP readings, but will call later with readings  Plan: Continue current medications: chlorthalidone  25mg  daily, losartan  50mg  daily, furosemide  20mg  daily, metoprolol  tartrate 75mg  twice a day, amlodipine  5 mg daily Focus on remembering to taking 1 and 1/2 tablets of metoprolol  tartrate to equal 75 mg twice daily. Call with home BP readings today Limit sweets, fried foods, and salt intake Continue to engage in physical activity targeting 150 minutes/week  Continue to limit stress Medication changes pending home readings. Consider either increasing losartan  or amlodipine .  Follow-up in clinic in 4 weeks   Thank you  B. Maegan Lamb, PharmD PGY-1 Pharmacy Resident [facility] 04/23/2024 12:10 PM   Kinsleigh Ludolph D Kaysen Deal, Pharm.D, BCACP, CPP  [facility] [address] [place], [place] [address]  Phone: [phone]; Fax: [phone]

## 2024-04-23 ENCOUNTER — Ambulatory Visit: Admitting: Pharmacist

## 2024-04-23 VITALS — BP 136/98 | HR 94

## 2024-04-23 DIAGNOSIS — I10 Essential (primary) hypertension: Secondary | ICD-10-CM | POA: Diagnosis present

## 2024-04-23 NOTE — Patient Instructions (Signed)
 Start taking 1.5 tablets of metoprolol  twice a day.  Please call me when you get home with your blood pressure readings [phone]  Try to limit sweets/salt  Your blood pressure goal is < 130/52mmHg    Important lifestyle changes to control high blood pressure  Intervention  Effect on the BP   Weight loss Weight loss is one of the most effective lifestyle changes for controlling blood pressure. If you're overweight or obese, losing even a small amount of weight can help reduce blood pressure.    Blood pressure can decrease by 1 millimeter of mercury (mmHg) with each kilogram (about 2.2 pounds) of weight lost.   Exercise regularly As a general goal, aim for 30 minutes of moderate physical activity every day.    Regular physical activity can lower blood pressure by 5 - 8 mmHg.   Eat a healthy diet Eat a diet rich in whole grains, fruits, vegetables, lean meat, and low-fat dairy products. Limit processed foods, saturated fat, and sweets.    A heart-healthy diet can lower high blood pressure by 10 mmHg.   Reduce salt (sodium) in your diet Aim for 000mg  of sodium each day. Avoid deli meats, canned food, and frozen microwave meals which are high in sodium.     Limiting sodium can reduce blood pressure by 5 mmHg.   Limit alcohol One drink equals 12 ounces of beer, 5 ounces of wine, or 1.5 ounces of 80-proof liquor.    Limiting alcohol to < 1 drink a day for women or < 2 drinks a day for men can help lower blood pressure by about 4 mmHg.   To check your pressure at home you will need to:   Sit up in a chair, with feet flat on the floor and back supported. Do not cross your ankles or legs. Rest your left arm so that the cuff is about heart level. If the cuff goes on your upper arm, then just relax your arm on the table, arm of the chair, or your lap. If you have a wrist cuff, hold your wrist against your chest at heart level. Place the cuff snugly around your arm, about 1  inch above the crease of your elbow. The cords should be inside the groove of your elbow.  Sit quietly, with the cuff in place, for about 5 minutes. Then press the power button to start a reading. Do not talk or move while the reading is taking place.  Record your readings on a sheet of paper. Although most cuffs have a memory, it is often easier to see a pattern developing when the numbers are all in front of you.  You can repeat the reading after 1-3 minutes if it is recommended.   Make sure your bladder is empty and you have not had caffeine or tobacco within the last 30 minutes   Always bring your blood pressure log with you to your appointments. If you have not brought your monitor in to be double checked for accuracy, please bring it to your next appointment.   You can find a list of validated (accurate) blood pressure cuffs at: validatebp.org

## 2024-04-23 NOTE — Assessment & Plan Note (Addendum)
 Assessment: Patient's in-clinic BP was 132/100 and 136/98, which is above goal of <130/80 Patient stated she ate fried pork chop this morning prior to coming to clinic  Patient stated she rarely misses any doses of her BP medications Patient stated she has been taking just 1 tablet of the metoprolol  tartrate 50 mg twice daily, which is decreased from her prescribed dose of 75 mg  Patient was unable to recall at-home BP readings, but will call later with readings  Plan: Continue current medications: chlorthalidone  25mg  daily, losartan  50mg  daily, furosemide  20mg  daily, metoprolol  tartrate 75mg  twice a day, amlodipine  5 mg daily Focus on remembering to taking 1 and 1/2 tablets of metoprolol  tartrate to equal 75 mg twice daily. Call with home BP readings today Limit sweets, fried foods, and salt intake Continue to engage in physical activity targeting 150 minutes/week  Continue to limit stress Medication changes pending home readings. Consider either increasing losartan  or amlodipine .  Follow-up in clinic in 4 weeks

## 2024-04-24 ENCOUNTER — Ambulatory Visit: Attending: Internal Medicine | Admitting: Internal Medicine

## 2024-04-24 ENCOUNTER — Encounter: Payer: Self-pay | Admitting: Internal Medicine

## 2024-04-24 VITALS — BP 116/86 | HR 107 | Ht 66.5 in | Wt 214.0 lb

## 2024-04-24 DIAGNOSIS — I493 Ventricular premature depolarization: Secondary | ICD-10-CM | POA: Diagnosis not present

## 2024-04-24 DIAGNOSIS — R002 Palpitations: Secondary | ICD-10-CM | POA: Diagnosis present

## 2024-04-24 NOTE — Progress Notes (Signed)
 Cardiology Office Note   Date:  04/24/2024  ID:  INDYAH SAULNIER, DOB 09/13/1970, MRN [id number] PCP: Delbert Clam, MD  [facility] Providers Cardiologist:  Stanly DELENA Leavens, MD     History of Present Illness Tina Moss is a 53 y.o. female with past medical history of coronary artery disease, aortic atherosclerosis, PVCs, hypertension, hyperlipidemia, history of CVA, seizure disorder, diabetes.  Followed by Dr. Toney presents today for 101-month follow-up appointment  Patient previously had CVA in 03/2018.  Echocardiogram showed mild LVH, EF 60 to 65%, no wall motion abnormalities.  TEE showed no evidence of thrombus in the left atrial appendage, no evidence of vegetation, no defect or PFO.  Patient later underwent coronary CTA in 02/2022 that showed minimal nonobstructive CAD, coronary calcium  score 13 (89 percentile).   In 08/2023, patient reported fatigue, shortness of breath, palpitations.  Cardiogram 09/02/2023 showed EF 55-60%, no regional wall motion abnormalities, normal RV systolic function, no significant valvular abnormalities.  Cardiac monitor showed frequent PVCs with 12% burden, short runs of SVT.  She was seen by cardiology in 12/2023 for a follow-up appointment.  She had tachycardia on exam.  3-day monitor showed 10.5% PVC burden.  Her metoprolol  tartrate was increased to 75 mg twice daily.  2 weeks later repeated cardiac monitor that showed 13% PVC burden.  A limited echo was ordered and showed EF 60 to 65%.  ROS: abdominal discomfort when bending over   Studies Reviewed        Risk Assessment/Calculations           Physical Exam VS:  BP 116/86   Pulse (!) 107   Ht 5' 6.5 (1.689 m)   Wt 214 lb (97.1 kg)   LMP  (LMP Unknown) Comment: more than a year since last menstrual  SpO2 95%   BMI 34.02 kg/m        Wt Readings from Last 3 Encounters:  04/24/24 214 lb (97.1 kg)  04/12/24 213 lb (96.6 kg)  04/11/24 213 lb 10 oz (96.9 kg)    GEN:  Well nourished, well developed in no acute distress NECK: No JVD; No carotid bruits CARDIAC: RRR, no murmurs, rubs, gallops RESPIRATORY:  Clear to auscultation without rales, wheezing or rhonchi  ABDOMEN: Soft, non-tender, non-distended EXTREMITIES:  No edema; No deformity   ASSESSMENT AND PLAN  Frequent PVCs  - Patient had 12% PVC burden on monitor in 08/2023.  10.5% burden on monitor in 12/2023.  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  - Will hold off on EP for now as her EF is normal and she is tolerating the lopressor .  - follow up in 6 months or sooner if symptomatic  Coronary Artery Disease - Coronary CTA in 02/2022 showed coronary calcium  score 13 (89th percentile), minimal nonobstructive CAD - Continue Plavix  75 mg daily and Lipitor 80 mg daily  History of CVA  - Continue Lipitor 80 mg daily and Plavix  75 mg daily  HLD - Lipid panel from 01/2024 showed LDL 82, HDL 42, triglycerides 61, total cholesterol 173 - Goal LDL less than 55 with history of CVA - She has been referred to the Pharm.D. lipid clinic   HTN  - Continue amlodipine  5 mg daily, losartan  50 mg  daily, metoprolol  tartrate 75 mg twice daily, chlorthalidone  25 mg daily - K3.4, creatinine 0.79 on 8/9.   Abdominal discomfort when bending over - history of uterine fibroids - advised to follow up with her gynecologist to see if this is related.        Dispo: 6 months with Dr. Catherene or APP  Signed, Emeline FORBES Calender, MD

## 2024-04-24 NOTE — Patient Instructions (Signed)
 Medication Instructions:  No medication changes were made at this visit. Continue current regimen.   Follow-Up: At [facility], you and your health needs are our priority.  As part of our continuing mission to provide you with exceptional heart care, our providers are all part of one team.  This team includes your primary Cardiologist (physician) and Advanced Practice Providers or APPs (Physician Assistants and Nurse Practitioners) who all work together to provide you with the care you need, when you need it.  Your next appointment:   6 month(s)  Provider:   One of our Advanced Practice Providers (APPs): Morse Clause, PA-C  Lamarr Satterfield, NP Miriam Shams, NP  Olivia Pavy, PA-C Josefa Beauvais, NP  Leontine Salen, PA-C Orren Fabry, PA-C  Hao Meng, PA-C Ernest Dick, NP  Damien Braver, NP Jon Hails, PA-C  Waddell Donath, PA-C    Dayna Dunn, PA-C  Scott Weaver, PA-C Lum Louis, NP Katlyn West, NP Callie Goodrich, PA-C  Xika Zhao, NP Sheng Haley, PA-C    Kathleen Johnson, PA-C    We recommend signing up for the patient portal called MyChart.  Sign up information is provided on this After Visit Summary.  MyChart is used to connect with patients for Virtual Visits (Telemedicine).  Patients are able to view lab/test results, encounter notes, upcoming appointments, etc.  Non-urgent messages can be sent to your provider as well.   To learn more about what you can do with MyChart, go to [email or web address].

## 2024-04-27 ENCOUNTER — Telehealth: Payer: Self-pay

## 2024-04-27 NOTE — Telephone Encounter (Signed)
 Patient was seen in HTN clinic by PharmD on 04/23/2024. During the visit, patient did not have her blood pressure cuff or log, so she was instructed to call back with those readings once she got home. Called patient to obtain readings. Patient stated that she thinks her granddaughter took her BP book to school with her and she is waiting for her daughter to bring it back. Patient stated that she had some readings written on little sheets of paper that she would call Melissa back with today.

## 2024-04-30 ENCOUNTER — Other Ambulatory Visit: Payer: Self-pay

## 2024-04-30 ENCOUNTER — Encounter (HOSPITAL_COMMUNITY): Payer: Self-pay

## 2024-04-30 ENCOUNTER — Other Ambulatory Visit: Payer: Self-pay | Admitting: Family Medicine

## 2024-04-30 ENCOUNTER — Ambulatory Visit (HOSPITAL_COMMUNITY): Admitting: Clinical

## 2024-04-30 MED ORDER — FUROSEMIDE 20 MG PO TABS
20.0000 mg | ORAL_TABLET | Freq: Every day | ORAL | 0 refills | Status: DC
  Filled 2024-04-30: qty 30, 30d supply, fill #0

## 2024-05-01 ENCOUNTER — Other Ambulatory Visit: Payer: Self-pay

## 2024-05-02 ENCOUNTER — Encounter: Payer: Self-pay | Admitting: Pharmacist

## 2024-05-02 ENCOUNTER — Other Ambulatory Visit: Payer: Self-pay

## 2024-05-03 ENCOUNTER — Other Ambulatory Visit: Payer: Self-pay
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
## 2024-05-14 ENCOUNTER — Other Ambulatory Visit: Payer: Self-pay

## 2024-05-15 ENCOUNTER — Other Ambulatory Visit: Payer: Self-pay

## 2024-05-17 ENCOUNTER — Other Ambulatory Visit: Payer: Self-pay

## 2024-05-17 ENCOUNTER — Ambulatory Visit (INDEPENDENT_AMBULATORY_CARE_PROVIDER_SITE_OTHER): Admitting: Psychiatry

## 2024-05-17 DIAGNOSIS — E559 Vitamin D deficiency, unspecified: Secondary | ICD-10-CM | POA: Diagnosis not present

## 2024-05-17 DIAGNOSIS — F4310 Post-traumatic stress disorder, unspecified: Secondary | ICD-10-CM

## 2024-05-17 DIAGNOSIS — F3289 Other specified depressive episodes: Secondary | ICD-10-CM

## 2024-05-17 DIAGNOSIS — F331 Major depressive disorder, recurrent, moderate: Secondary | ICD-10-CM | POA: Diagnosis not present

## 2024-05-17 DIAGNOSIS — R413 Other amnesia: Secondary | ICD-10-CM | POA: Diagnosis not present

## 2024-05-17 MED ORDER — FLUOXETINE HCL 40 MG PO CAPS
40.0000 mg | ORAL_CAPSULE | Freq: Every morning | ORAL | 0 refills | Status: DC
  Filled 2024-05-17: qty 90, 90d supply, fill #0
  Filled 2024-05-28: qty 30, 30d supply, fill #0
  Filled 2024-07-02 – 2024-07-03 (×3): qty 30, 30d supply, fill #1

## 2024-05-17 MED ORDER — PRAZOSIN HCL 1 MG PO CAPS
1.0000 mg | ORAL_CAPSULE | Freq: Every day | ORAL | 0 refills | Status: DC
  Filled 2024-05-17: qty 90, 90d supply, fill #0
  Filled 2024-05-28: qty 30, 30d supply, fill #0
  Filled 2024-07-02 – 2024-07-03 (×2): qty 30, 30d supply, fill #1

## 2024-05-25 ENCOUNTER — Emergency Department (HOSPITAL_COMMUNITY)

## 2024-05-25 ENCOUNTER — Emergency Department (HOSPITAL_COMMUNITY): Admission: EM | Admit: 2024-05-25 | Discharge: 2024-05-25 | Disposition: A | Discharge status: Home / Self Care

## 2024-05-25 DIAGNOSIS — M25562 Pain in left knee: Secondary | ICD-10-CM | POA: Insufficient documentation

## 2024-05-25 DIAGNOSIS — R052 Subacute cough: Secondary | ICD-10-CM | POA: Insufficient documentation

## 2024-05-25 DIAGNOSIS — Y9302 Activity, running: Secondary | ICD-10-CM | POA: Insufficient documentation

## 2024-05-25 DIAGNOSIS — Z7901 Long term (current) use of anticoagulants: Secondary | ICD-10-CM | POA: Insufficient documentation

## 2024-05-25 DIAGNOSIS — X58XXXA Exposure to other specified factors, initial encounter: Secondary | ICD-10-CM | POA: Insufficient documentation

## 2024-05-25 MED ORDER — ACETAMINOPHEN 500 MG PO TABS
1000.0000 mg | ORAL_TABLET | Freq: Once | ORAL | Status: AC
  Administered 2024-05-25: 1000 mg via ORAL
  Filled 2024-05-25: qty 2

## 2024-05-25 NOTE — ED Provider Notes (Signed)
 [facility] AT [facility] Provider Note   CSN: [id number] Arrival date & time: 05/25/24  1029     Patient presents with: Knee Pain and Cough   Tina Moss is a 53 y.o. female.    Knee Pain Cough   Presents because of left knee pain.  Happened whenever she was running to close the door.  Felt like her knee gave out.  Has been present for about a week.  Hurts with ambulation.  Has not taken thing for pain.  No obvious swelling.  Has had chronic knee problems in the past but seems to be worse now.  Endorsing cough.  About if it was coffee right now.  About he is at risk for infection.  She said cough is been present for about a week.  No fever chills or shortness of breath.  No chest pain.  No pleuritic chest pain or hemoptysis.   Previous medical history reviewed : Last seen in the ED on April 12, 2024.  Complained about a cough.  Shortness of breath.  Negative D-dimer.  Negative troponin.  Negative BNP.     Prior to Admission medications  Medication Sig Start Date End Date Taking? Authorizing Provider  albuterol  (VENTOLIN  HFA) 108 (90 Base) MCG/ACT inhaler Inhale 1-2 puffs into the lungs every 6 (six) hours as needed for wheezing or shortness of breath. 04/12/24   Newlin, Enobong, MD  amLODipine  (NORVASC ) 5 MG tablet Take 1 tablet (5 mg total) by mouth daily. 12/28/23   Chandrasekhar, Stanly LABOR, MD  atorvastatin  (LIPITOR) 80 MG tablet Take 1 tablet (80 mg total) by mouth daily.Must have office visit for refills 03/26/24   Newlin, Enobong, MD  benzonatate  (TESSALON ) 100 MG capsule Take 1 capsule (100 mg total) by mouth every 8 (eight) hours. 11/12/23   Raspet, Rocky MARLA, PA-C  Blood Pressure Monitor MISC 1 each by Does not apply route daily. 12/28/23   Chandrasekhar, Stanly LABOR, MD  cetirizine  (ZYRTEC ) 10 MG tablet Take 1 tablet (10 mg total) by mouth daily.Must have office visit for refills 03/26/24   Newlin, Enobong, MD  chlorthalidone  (HYGROTON ) 25  MG tablet Take 1 tablet (25 mg total) by mouth daily. 09/27/23   Newlin, Enobong, MD  clopidogrel  (PLAVIX ) 75 MG tablet Take 1 tablet (75 mg total) by mouth daily.Must have office visit for refills 03/26/24   Delbert Clam, MD  diclofenac  Sodium (VOLTAREN ) 1 % GEL Apply 2 g topically 4 (four) times daily. 03/30/23   Gabino Boga, MD  donepezil  (ARICEPT ) 10 MG tablet Take 1 tablet (10 mg total) by mouth daily. 09/27/23   Newlin, Enobong, MD  FLUoxetine  (PROZAC ) 40 MG capsule Take 1 capsule (40 mg total) by mouth every morning. 05/17/24 08/15/24  Chien, Stephanie, MD  furosemide  (LASIX ) 20 MG tablet Take 1 tablet (20 mg total) by mouth daily. 04/30/24   Newlin, Enobong, MD  losartan  (COZAAR ) 50 MG tablet Take 1 tablet (50 mg total) by mouth daily.Must have office visit for refills 03/26/24   Newlin, Enobong, MD  metoprolol  tartrate (LOPRESSOR ) 50 MG tablet Take 1.5 tablets (75 mg total) by mouth 2 (two) times daily. 01/25/24 06/14/24  Trudy Birmingham, PA-C  oxybutynin  (DITROPAN  XL) 5 MG 24 hr tablet Take 1 tablet (5 mg total) by mouth at bedtime. For overactive bladder.Must have office visit for refills 03/26/24   Delbert Clam, MD  prazosin  (MINIPRESS ) 1 MG capsule Take 1 capsule (1 mg total) by mouth at bedtime. 05/17/24 08/15/24  Chien, Stephanie, MD  tiZANidine  (ZANAFLEX ) 4 MG tablet Take 1 tablet (4 mg total) by mouth every 8 (eight) hours as needed. 08/24/23   Newlin, Enobong, MD  triamcinolone  cream (KENALOG ) 0.1 % Apply 1 Application topically 2 (two) times daily. 02/04/24 08/02/24  [provider]    Allergies: Patient has no known allergies.    Review of Systems  Respiratory:  Positive for cough.     Updated Vital Signs BP 121/85   Pulse 91   Temp 98 F (36.7 C) (Oral)   Resp 17   Ht 5' 7 (1.702 m)   Wt 97.5 kg   LMP  (LMP Unknown) Comment: more than a year since last menstrual  SpO2 95%   BMI 33.67 kg/m   Physical Exam Vitals and nursing note reviewed.   Constitutional:      General: She is not in acute distress.    Appearance: She is well-developed.  HENT:     Head: Normocephalic and atraumatic.  Eyes:     Conjunctiva/sclera: Conjunctivae normal.  Cardiovascular:     Rate and Rhythm: Normal rate and regular rhythm.     Heart sounds: No murmur heard. Pulmonary:     Effort: Pulmonary effort is normal. No respiratory distress.     Breath sounds: Normal breath sounds.  Abdominal:     Palpations: Abdomen is soft.     Tenderness: There is no abdominal tenderness.  Musculoskeletal:        General: No swelling.     Cervical back: Neck supple.  Skin:    General: Skin is warm and dry.     Capillary Refill: Capillary refill takes less than 2 seconds.  Neurological:     Mental Status: She is alert.  Psychiatric:        Mood and Affect: Mood normal.     (all labs ordered are listed, but only abnormal results are displayed) Labs Reviewed - No data to display  EKG: None  Radiology: DG Chest Portable 1 View Result Date: 05/25/2024 CLINICAL DATA:  Cough EXAM: PORTABLE CHEST 1 VIEW COMPARISON:  April 12, 2024 FINDINGS: The heart size and mediastinal contours are within normal limits. Stable elevated left hemidiaphragm. Both lungs are clear. The visualized skeletal structures are unremarkable. IMPRESSION: No active disease. Electronically Signed   By: Lynwood Landy Raddle M.D.   On: 05/25/2024 12:18   DG Knee Complete 4 Views Left Result Date: 05/25/2024 CLINICAL DATA:  Left knee pain after injury this week EXAM: LEFT KNEE - COMPLETE 4+ VIEW COMPARISON:  August 30, 2023 FINDINGS: No evidence of fracture or dislocation. Small suprapatellar joint effusion. No evidence of arthropathy or other focal bone abnormality. Soft tissues are unremarkable. IMPRESSION: Small suprapatellar joint effusion. No fracture or dislocation. Electronically Signed   By: Lynwood Landy Raddle M.D.   On: 05/25/2024 12:17     Procedures   Medications Ordered in the ED   acetaminophen  (TYLENOL ) tablet 1,000 mg (1,000 mg Oral Given 05/25/24 1132)                                    Medical Decision Making Amount and/or Complexity of Data Reviewed Radiology: ordered.  Risk OTC drugs.     HPI:   Presents because of left knee pain.  Happened whenever she was running to close the door.  Felt like her knee gave out.  Has been present for about a week.  Hurts with ambulation.  Has not taken thing for pain.  No obvious swelling.  Has had chronic knee problems in the past but seems to be worse now.  Endorsing cough.  About if it was coffee right now.  About he is at risk for infection.  She said cough is been present for about a week.  No fever chills or shortness of breath.  No chest pain.  No pleuritic chest pain or hemoptysis.   Previous medical history reviewed : Last seen in the ED on April 12, 2024.  Complained about a cough.  Shortness of breath.  Negative D-dimer.  Negative troponin.  Negative BNP.     MDM:   Upon exam, patient hemodynamically stable.   In terms of cough.  Likely upper upper respiratory infection.  Viral in nature.  Lung sounds clear to auscultation bilaterally.  Positive sick contacts.  Will obtain chest x-ray to make sure there is no superimposed pneumonia though I think this is unlikely.  No negation for labs.  No chest pain.  No shortness of breath.  If chest x-ray negative, no further workup needed.  Terms of knee pain.  Happened acutely.  No concerns for DVT.  No obvious ligamentous laxity on exam.  Patella is midline.  No patella tendon rupture that I can see.  Will obtain x-ray to make sure there is no underlying fracture as I think this is unlikely.  She will  likely need follow-up with orthopedic surgery  Reevaluation:   Upon reexamination, patient hemodynamically stable.  Remains A&O x 3 with GCS 15.  X-ray chest unremarkable.  No infiltrate.  No pneumonia.  X-ray of the knee shows small patella joint effusion.   No fracture.  No occasion for aspiration of this area.  Patient will follow-up with orthopedic surgery     Disposition and Follow Up: ortho      Final diagnoses:  Subacute cough  Acute pain of left knee    ED Discharge Orders     None          Simon Lavonia SAILOR, MD 05/25/24 1651

## 2024-05-25 NOTE — Discharge Instructions (Addendum)
 For pain, you can take 1000 mg of Tylenol  or 1 g of Tylenol  every 6-8 hours.  Do not exceed more than 4000 mg or 4 g in a 24-hour period.    Please follow-up with orthopedic surgery.  You do have a small collection of fluid around your knee.  No concerns for any kind of infected joint at this time.  If you have any kind of fevers please come back.  You do need to follow-up with orthopedic surgery so that way they can get MRI if they feel like it is needed.   Your chest x-ray was unremarkable.  Come back if you have any kind of chest pain or shortness of breath.

## 2024-05-25 NOTE — ED Triage Notes (Addendum)
 Patient in today reporting left knee pain, reports hx of arthritis. Reports injury this week while running. Also reporting ongoing cough - states that everyone in the home is sick with cough.

## 2024-05-28 ENCOUNTER — Other Ambulatory Visit: Payer: Self-pay | Admitting: Family Medicine

## 2024-05-28 ENCOUNTER — Other Ambulatory Visit (INDEPENDENT_AMBULATORY_CARE_PROVIDER_SITE_OTHER)

## 2024-05-28 ENCOUNTER — Other Ambulatory Visit: Payer: Self-pay

## 2024-05-28 DIAGNOSIS — I10 Essential (primary) hypertension: Secondary | ICD-10-CM | POA: Diagnosis not present

## 2024-05-28 DIAGNOSIS — E559 Vitamin D deficiency, unspecified: Secondary | ICD-10-CM

## 2024-05-28 NOTE — Progress Notes (Signed)
 Patient presented to the office for labs, labs were drawn right hand with no issue or complaints . Pt left office alert and ambulatory.

## 2024-05-29 ENCOUNTER — Other Ambulatory Visit: Payer: Self-pay

## 2024-05-29 LAB — VITAMIN D 25 HYDROXY (VIT D DEFICIENCY, FRACTURES): Vit D, 25-Hydroxy: 15.3 ng/mL — ABNORMAL LOW (ref 30.0–100.0)

## 2024-05-29 MED ORDER — CHLORTHALIDONE 25 MG PO TABS
25.0000 mg | ORAL_TABLET | Freq: Every day | ORAL | 0 refills | Status: DC
  Filled 2024-05-29: qty 30, 30d supply, fill #0

## 2024-05-29 MED ORDER — FUROSEMIDE 20 MG PO TABS
20.0000 mg | ORAL_TABLET | Freq: Every day | ORAL | 0 refills | Status: DC
  Filled 2024-05-29: qty 30, 30d supply, fill #0

## 2024-05-30 ENCOUNTER — Other Ambulatory Visit: Payer: Self-pay

## 2024-05-30 ENCOUNTER — Ambulatory Visit (HOSPITAL_COMMUNITY): Payer: Self-pay | Admitting: Psychiatry

## 2024-06-05 ENCOUNTER — Other Ambulatory Visit: Payer: Self-pay

## 2024-06-12 ENCOUNTER — Other Ambulatory Visit: Payer: Self-pay

## 2024-06-25 ENCOUNTER — Ambulatory Visit: Admitting: Pharmacist

## 2024-06-26 NOTE — Progress Notes (Incomplete)
 "   Mild Cognitive Impairment    Tina Moss is a very pleasant 54 y.o. RH female with a history of hypertension, hyperlipidemia, LM cerebral artery stroke, anemia, Vit D deficiency,  tobacco use,  anxiety, depression, history of fibroids, history of MCI of uncertain etiology, history syphilis in June 2023 followed by PCP  presenting today in follow-up for evaluation of memory loss. Patient is on donepezil  10 mg daily***. Patient was last seen on 12/26/2023***. Memory is ***. MMSE today is  /30. Patient is able to participate on ADLs and to to drive without difficulties. Mood is ***. This patient is here alone. Previous records as well as any outside records available were reviewed prior to todays visit   Follow up in  *** months Continue donepezil  10 mg daily, side effects discussed Continue to control mood as per PCP BH Recommend good control of cardiovascular risk factors Monitor driving      Discussed the use of AI scribe software for clinical note transcription with the patient, who gave verbal consent to proceed.  History of Present Illness       Any changes in memory since last visit? I can't keep up with certain things, I can't really tell because I am not feeling well. Have issues with comprehension, but overall stable from prior.  She no longer enjoys reading letters and cards. repeats oneself?  Endorsed Disoriented when walking into a room?  Patient denies    Misplacing objects?  She may misplace some things at times such as the glasses but then finds them after a while. Wandering behavior?  Denies, but she sees behavioral health.  There are family issues that contribute to depression . I need to see the counselor soon, she says  Any personality changes since last visit? Denies.   Any worsening depression?: denies.   Hallucinations or paranoia?  Denies.   Seizures?   Denies.    Any sleep changes? Not sleeping well due to preoccupation.  She reports  vivid dreams,  denies REM behavior or sleepwalking   Sleep apnea?   denies    Any hygiene concerns?   Denies.   Independent of bathing and dressing?  Endorsed  Does the patient needs help with medications? Patient is in charge sometimes he misses doses  Who is in charge of the finances?  Mother is in charge     Any changes in appetite?  Decrease, sometimes she does not have appetite  Patient have trouble swallowing?  Denies.   Does the patient cook?  Any kitchen accidents such as leaving the stove on?   Denies.   Any headaches?    Denies.   Vision changes? Denies. Chronic pain?  She has chronic back and knee pain does PT at home Ambulates with difficulty?    Due to chronic pain, she may ambulate with some difficulty.   Recent falls or head injuries?   She had a mechanical fall in August 2025 after she tripped while trying to get up from a sitting position at the restaurant, falling forward and the right side of her head hit the table without LOC or concussion, negative workup.  Unilateral weakness, numbness or tingling?  Denies.   Any tremors?  Denies.   Any anosmia?    Denies.   Any incontinence of urine?  Endorsed, I have leakage Any bowel dysfunction?  Chronic constipation Patient lives with her sister.  Does the patient drive?  Endorsed, sometimes she does not remember where she supposed to  go, I get nervous at stops   Quit smoking several months go         Initial visit 09/10/2021 the patient is seen in neurologic consultation at the request of Newlin, Enobong, MD for the evaluation of memory.  The patient is here alone. This is a 54 y.o. year old RH  female who has no recalling when her memory issues began, I will send my by Dr. Here.  She reports that she is very distracted, forgets appointments or things to do, forgets the time and she is unable to comprehend well.  She states that when she was a child, she was diagnosed with epilepsy, but she is not aware when she stopped taking medications  for it.  When she was in grade school, she receives special education I had a teacher, english as a foreign language .  Of note, during this visit, the patient does have difficulty comprehending, expressing as well as being incoherent at times.  She works at [employer], and has noted that lately she has been burning some of the biscuits.  When she drives, sometimes she does not know where she goes with the car and has to stop, and she reports that occasionally she has a blank .  She leaves objects everywhere, the other day I left my keys in the drawer, and papers near the mattress .  She ambulates without difficulty, although in the past she had right-sided weakness without residual due to the stroke.  Her mood is depressed, denies any intermittent irritability.  She sleeps fairly well, denies vivid dreams or sleepwalking.  She does have some hallucinations seeing floating blocks, little colors dancing .  Sometimes, she reports being paranoid.  She denies any hygiene concerns, she is independent of bathing and dressing.  The medications are in a pillbox, but she forgets sometimes to take them.  When asked about who is in charge of the finances, incoherent answer is reported, stating there are calls that the insurance lady takes care of .  She cannot give a straight answer regarding finances.  Appetite is good, denies trouble swallowing.  She denies any headaches, sometimes I have double vision denies any dizziness, focal numbness or tingling, or unilateral weakness.  She denies tremors or anosmia.  Denies urine incontinence, sometimes she has intermittent constipation and diarrhea.  Denies sleep apnea, or alcohol.  She smokes less than a pack a day I used to smoke more .       MRI of the brain March 2023 1. No acute intracranial pathology. No significant change since the study from 04/03/2021. 2. Multiple remote infarcts in the left frontal lobe cortex and bilateral centrum semiovale on a background of  age advanced chronic white matter microangiopathy, not significantly changed since the prior study.    Past Medical History:  Diagnosis Date   Anemia    CVA (cerebral vascular accident) (HCC)    2020   Epilepsy (HCC)    Fibroids    H/O bacterial infection    H/O mumps    Hyperlipidemia    Hypertension    Seizures (HCC)      Past Surgical History:  Procedure Laterality Date   CESAREAN SECTION     x2. at term   TEE WITHOUT CARDIOVERSION N/A 04/06/2018   Procedure: TRANSESOPHAGEAL ECHOCARDIOGRAM (TEE) BUBBLE STUDY;  Surgeon: Pietro Redell RAMAN, MD;  Location: [facility] ENDOSCOPY;  Service: Cardiovascular;  Laterality: N/A;   TUBAL LIGATION     WISDOM TOOTH EXTRACTION  Objective:     PHYSICAL EXAMINATION:    VITALS:  There were no vitals filed for this visit.  GEN:  The patient appears stated age and is in NAD. HEENT:  Normocephalic, atraumatic.   Neurological examination:  General: NAD, well-groomed, appears stated age. Orientation: The patient is alert. Oriented to person, place and not to date.*** Cranial nerves: There is good facial symmetry.The speech is fluent and clear, tangential, sometimes hesitant. No aphasia or dysarthria. Fund of knowledge is appropriate. Recent memory impaired and remote memory is normal.  Attention and concentration are normal.  Able to name objects and repeat phrases.  Hearing is intact to conversational tone ***.   Delayed recall *** Sensation: Sensation is intact to light touch throughout Motor: Strength is at least antigravity x4. DTR's 2/4 in UE/LE      05/25/2022   11:21 AM 05/19/2022    2:00 PM 09/10/2021    8:00 AM  Montreal Cognitive Assessment   Visuospatial/ Executive (0/5) 2 2 3   Naming (0/3) 1 1 2   Attention: Read list of digits (0/2) 2 2 2   Attention: Read list of letters (0/1) 1 1 1   Attention: Serial 7 subtraction starting at 100 (0/3) 0 0 0  Language: Repeat phrase (0/2) 1 1 0  Language : Fluency (0/1) 0 0 1   Abstraction (0/2) 0 0 0  Delayed Recall (0/5) 4 4 2   Orientation (0/6) 5 5 6   Total 16 16 17   Adjusted Score (based on education) 17 16 18        12/26/2023    6:00 PM 06/14/2023    2:00 PM 11/18/2022    3:00 PM  MMSE - Mini Mental State Exam  Orientation to time 2 4 5   Orientation to Place 3 4 5   Registration 3 3 3   Attention/ Calculation 4 4 5   Recall 1 2 1   Language- name 2 objects 2 2 2   Language- repeat 1 1 1   Language- follow 3 step command 3 3 3   Language- read & follow direction 1 1 1   Write a sentence 1 1 1   Copy design 1 1 1   Total score 22 26 28       Movement examination: Tone: There is normal tone in the UE/LE Abnormal movements:  no tremor.  No myoclonus.  No asterixis.   Coordination:  There is no decremation with RAM's. Normal finger to nose  Gait and Station: The patient has no difficulty arising out of a deep-seated chair without the use of the hands. The patient's stride length is good.  Gait is cautious and narrow.   Thank you for allowing us  the opportunity to participate in the care of this nice patient. Please do not hesitate to contact us  for any questions or concerns.   Total time spent on today's visit was *** minutes dedicated to this patient today, preparing to see patient, examining the patient, ordering tests and/or medications and counseling the patient, documenting clinical information in the EHR or other health record, independently interpreting results and communicating results to the patient/family, discussing treatment and goals, answering patient's questions and coordinating care.  Cc:  Delbert Clam, MD  Camie Sevin 06/26/2024 5:26 AM      "

## 2024-06-27 ENCOUNTER — Ambulatory Visit: Admitting: Physician Assistant

## 2024-06-27 ENCOUNTER — Telehealth: Payer: Self-pay

## 2024-06-27 ENCOUNTER — Encounter: Payer: Self-pay | Admitting: Physician Assistant

## 2024-06-27 NOTE — Telephone Encounter (Signed)
 Good afternoon,   RN called to confirm and patient IS STILL TAKING PLAVIX . Per policy needs to have OV first before procedure.  Patient prefers a Friday OV, RN unable to see any open slots for Dr. Nandigam or PA's schedule. Please advise.  RN to cancel In-person PV on 06/29/2024 at 11:00 AM, RN 49 and colonoscopy on 07/09/2024 at 10:30 AM with Dr. Shila.

## 2024-06-27 NOTE — Telephone Encounter (Signed)
 RN called to notify patient of OV scheduled on 07/27/2024 at 10:30 AM at [facility]. Patient verbalizes understanding.

## 2024-06-27 NOTE — Telephone Encounter (Signed)
 Patient has been scheduled for 2/13 office visit.

## 2024-06-28 ENCOUNTER — Telehealth: Payer: Self-pay | Admitting: Physician Assistant

## 2024-06-28 NOTE — Telephone Encounter (Signed)
 We are writing to inform you that [facility], including all providers within this practice, will no longer be able to provide medical care to you. This decision is the result of repeated missed appointments without adequate notice, which has disrupted our ability to provide timely and effective care to all patients.  06/27/24

## 2024-06-29 ENCOUNTER — Other Ambulatory Visit: Payer: Self-pay | Admitting: Family Medicine

## 2024-06-29 ENCOUNTER — Encounter

## 2024-06-29 ENCOUNTER — Other Ambulatory Visit: Payer: Self-pay

## 2024-06-29 DIAGNOSIS — E785 Hyperlipidemia, unspecified: Secondary | ICD-10-CM

## 2024-06-29 DIAGNOSIS — R053 Chronic cough: Secondary | ICD-10-CM

## 2024-06-29 DIAGNOSIS — I10 Essential (primary) hypertension: Secondary | ICD-10-CM

## 2024-06-29 DIAGNOSIS — Z8673 Personal history of transient ischemic attack (TIA), and cerebral infarction without residual deficits: Secondary | ICD-10-CM

## 2024-06-29 MED ORDER — LOSARTAN POTASSIUM 50 MG PO TABS
50.0000 mg | ORAL_TABLET | Freq: Every day | ORAL | 0 refills | Status: AC
  Filled 2024-06-29: qty 30, 30d supply, fill #0
  Filled 2024-07-02: qty 90, 90d supply, fill #0
  Filled 2024-07-02 – 2024-07-03 (×2): qty 30, 30d supply, fill #0

## 2024-06-29 MED ORDER — OXYBUTYNIN CHLORIDE ER 5 MG PO TB24
5.0000 mg | ORAL_TABLET | Freq: Every day | ORAL | 0 refills | Status: AC
  Filled 2024-06-29 – 2024-07-02 (×2): qty 30, 30d supply, fill #0
  Filled 2024-07-02: qty 90, 90d supply, fill #0
  Filled 2024-07-03: qty 30, 30d supply, fill #0

## 2024-06-29 MED ORDER — CETIRIZINE HCL 10 MG PO TABS
10.0000 mg | ORAL_TABLET | Freq: Every day | ORAL | 0 refills | Status: AC
  Filled 2024-06-29 – 2024-07-03 (×3): qty 30, 30d supply, fill #0

## 2024-06-29 MED ORDER — CHLORTHALIDONE 25 MG PO TABS
25.0000 mg | ORAL_TABLET | Freq: Every day | ORAL | 0 refills | Status: AC
  Filled 2024-06-29 – 2024-07-03 (×3): qty 30, 30d supply, fill #0

## 2024-06-29 MED ORDER — ATORVASTATIN CALCIUM 80 MG PO TABS
80.0000 mg | ORAL_TABLET | Freq: Every day | ORAL | 0 refills | Status: AC
  Filled 2024-06-29 – 2024-07-03 (×3): qty 30, 30d supply, fill #0

## 2024-06-29 MED ORDER — FUROSEMIDE 20 MG PO TABS
20.0000 mg | ORAL_TABLET | Freq: Every day | ORAL | 0 refills | Status: AC
  Filled 2024-06-29 – 2024-07-03 (×3): qty 30, 30d supply, fill #0

## 2024-06-29 MED ORDER — CLOPIDOGREL BISULFATE 75 MG PO TABS
75.0000 mg | ORAL_TABLET | Freq: Every day | ORAL | 0 refills | Status: AC
  Filled 2024-06-29 – 2024-07-03 (×3): qty 30, 30d supply, fill #0

## 2024-06-29 NOTE — Telephone Encounter (Signed)
 Appointment made. Requested Prescriptions  Pending Prescriptions Disp Refills   cetirizine  (ZYRTEC ) 10 MG tablet 90 tablet 0    Sig: Take 1 tablet (10 mg total) by mouth daily.Must have office visit for refills     Ear, Nose, and Throat:  Antihistamines 2 Passed - 06/29/2024  3:18 PM      Passed - Cr in normal range and within 360 days    Creatinine, Ser  Date Value Ref Range Status  04/12/2024 0.65 0.44 - 1.00 mg/dL Final         Passed - Valid encounter within last 12 months    Recent Outpatient Visits           8 months ago Degeneration of intervertebral disc of lumbar region with discogenic back pain   [facility] [place]. [facility] Delbert Clam, MD   10 months ago Other form of dyspnea   [facility] [place] [place]. [facility] Delbert Clam, MD   1 year ago Syphilis   [facility] Dept Of [place]. [facility] Delbert Clam, MD   1 year ago Chronic bilateral low back pain with bilateral sciatica   [facility] - [facility] Dept Of [place]. [facility] Delbert Clam, MD   2 years ago Respiratory complication   [place] [facility] Celestia Rosaline SQUIBB, NP       Future Appointments             In 1 month Maccia, Melissa D, RPH-CPP [facility] at [employer] [facility] [employer]. [facility], [facility]             atorvastatin  (LIPITOR) 80 MG tablet 90 tablet 0    Sig: Take 1 tablet (80 mg total) by mouth daily.Must have office visit for refills     Cardiovascular:  Antilipid - Statins Failed - 06/29/2024  3:18 PM      Failed - Lipid Panel in normal range within the last 12 months    Cholesterol, Total  Date Value Ref Range Status  01/16/2024 137 100 - 199 mg/dL Final   LDL Chol Calc (NIH)  Date Value Ref Range Status  01/16/2024 82 0 - 99 mg/dL Final   HDL  Date Value Ref Range Status  01/16/2024 42 >39  mg/dL Final   Triglycerides  Date Value Ref Range Status  01/16/2024 61 0 - 149 mg/dL Final         Passed - Patient is not pregnant      Passed - Valid encounter within last 12 months    Recent Outpatient Visits           8 months ago Degeneration of intervertebral disc of lumbar region with discogenic back pain   [facility] [facility] Of [place]. [facility] Delbert Clam, MD   10 months ago Other form of dyspnea   [facility] [place] [facility] Dept Of [place]. [facility] Delbert Clam, MD   1 year ago Syphilis   [facility] [place] - [facility] Dept Of [place]. [facility] Delbert Clam, MD   1 year ago Chronic bilateral low back pain with bilateral sciatica   [place] [facility] - [facility] Dept Of . [facility] [place], [place],  MD   2 years ago Respiratory complication   [place] [facility] Celestia Rosaline SQUIBB, NP       Future Appointments             In 1 month Maccia, Melissa D, RPH-CPP [facility] at [employer] [facility] [employer]. [facility], [facility]             losartan  (COZAAR ) 50 MG tablet 90 tablet 0    Sig: Take 1 tablet (50 mg total) by mouth daily.Must have office visit for refills     Cardiovascular:  Angiotensin Receptor Blockers Failed - 06/29/2024  3:18 PM      Failed - Valid encounter within last 6 months    Recent Outpatient Visits           8 months ago Degeneration of intervertebral disc of lumbar region with discogenic back pain   [facility] Of [place]. [facility] Delbert Clam, MD   10 months ago Other form of dyspnea   [facility] [place] Dept Of [place]. [facility] Delbert Clam, MD   1 year ago Syphilis   [facility] [place] [facility] Dept Of [place]. [facility] Delbert Clam, MD   1 year ago Chronic bilateral low back pain with bilateral  sciatica   [facility] [place] [facility] - A Dept Of [place]. [facility] Delbert Clam, MD   2 years ago Respiratory complication   [place] [facility] Celestia Rosaline SQUIBB, NP       Future Appointments             In 1 month Maccia, Melissa D, RPH-CPP [facility] at [employer] [facility] [employer]. [facility], [facility]            Passed - Cr in normal range and within 180 days    Creatinine, Ser  Date Value Ref Range Status  04/12/2024 0.65 0.44 - 1.00 mg/dL Final         Passed - K in normal range and within 180 days    Potassium  Date Value Ref Range Status  04/12/2024 3.6 3.5 - 5.1 mmol/L Final         Passed - Patient is not pregnant      Passed - Last BP in normal range    BP Readings from Last 1 Encounters:  05/25/24 121/85          clopidogrel  (PLAVIX ) 75 MG tablet 90 tablet 0    Sig: Take 1 tablet (75 mg total) by mouth daily.Must have office visit for refills     Hematology: Antiplatelets - clopidogrel  Failed - 06/29/2024  3:18 PM      Failed - Valid encounter within last 6 months    Recent Outpatient Visits           8 months ago Degeneration of intervertebral disc of lumbar region with discogenic back pain   [facility] [place]. [facility] Delbert Clam, MD   10 months ago Other form of dyspnea   [place] [facility] [place] - [facility] Dept Of . [facility] Delbert Clam, MD   1 year ago Syphilis   [place] [facility] [place] - [facility] Dept Of . [facility] Delbert Clam, MD  1 year ago Chronic bilateral low back pain with bilateral sciatica   [place] [facility] - A Dept Of [place]. [facility] Delbert Clam, MD   2 years ago Respiratory complication   [place] [facility] Celestia Rosaline SQUIBB, NP       Future Appointments             In 1 month Maccia, Melissa D, RPH-CPP [facility] at [employer] [facility] [employer]. [facility], [facility]            Passed - HCT in normal range and within 180 days    HCT  Date Value Ref Range Status  04/12/2024 39.4 36.0 - 46.0 % Final   Hematocrit  Date Value Ref Range Status  03/30/2023 43.6 34.0 - 46.6 % Final         Passed - HGB in normal range and within 180 days    Hemoglobin  Date Value Ref Range Status  04/12/2024 12.5 12.0 - 15.0 g/dL Final  89/83/7975 85.5 11.1 - 15.9 g/dL Final         Passed - PLT in normal range and within 180 days    Platelets  Date Value Ref Range Status  04/12/2024 250 150 - 400 K/uL Final  03/30/2023 230 150 - 450 x10E3/uL Final   Platelet Count, POC  Date Value Ref Range Status  12/14/2016 281 142 - 424 K/uL Final         Passed - Cr in normal range and within 360 days    Creatinine, Ser  Date Value Ref Range Status  04/12/2024 0.65 0.44 - 1.00 mg/dL Final          oxybutynin  (DITROPAN  XL) 5 MG 24 hr tablet 90 tablet 0    Sig: Take 1 tablet (5 mg total) by mouth at bedtime. For overactive bladder.Must have office visit for refills     Urology:  Bladder Agents Passed - 06/29/2024  3:18 PM      Passed - Valid encounter within last 12 months    Recent Outpatient Visits           8 months ago Degeneration of intervertebral disc of lumbar region with discogenic back pain   [facility] Of [place]. [facility] Delbert Clam, MD   10 months ago Other form of dyspnea   [facility] [place] [facility] Of [place]. [facility] Delbert Clam, MD   1 year ago Syphilis   [facility] [facility] Of [place] Delbert Clam, MD   1 year ago Chronic bilateral low back pain with bilateral sciatica   [facility] - A Dept Of [place]. [facility] Delbert Clam, MD   2 years ago Respiratory complication    [facility] Celestia Rosaline SQUIBB, NP        Future Appointments             In 1 month Maccia, Melissa D, RPH-CPP [facility] at [employer] [facility] [employer]. [facility], [facility]             chlorthalidone  (HYGROTON ) 25 MG tablet 30 tablet 0    Sig: Take 1 tablet (25 mg total) by mouth daily. Please schedule appointment with Dr. Newlin.     Cardiovascular: Diuretics -  Thiazide Failed - 06/29/2024  3:18 PM      Failed - Valid encounter within last 6 months    Recent Outpatient Visits           8 months ago Degeneration of intervertebral disc of lumbar region with discogenic back pain   [facility] [facility] Dept Of [place]. [facility] Delbert Clam, MD   10 months ago Other form of dyspnea   [place] [place] [facility] Dept Of [place]. [facility] Delbert Clam, MD   1 year ago Syphilis   [place] [facility] [place] - [facility] Dept Of [place]. [facility] Delbert Clam, MD   1 year ago Chronic bilateral low back pain with bilateral sciatica   [facility] - A Dept Of [place]. [facility] Delbert Clam, MD   2 years ago Respiratory complication   [place] [facility] Celestia Rosaline SQUIBB, NP       Future Appointments             In 1 month Maccia, Melissa D, RPH-CPP [facility] [employer] [facility] [employer]. [facility]            Passed - Cr in normal range and within 180 days    Creatinine, Ser  Date Value Ref Range Status  04/12/2024 0.65 0.44 - 1.00 mg/dL Final         Passed - K in normal range and within 180 days    Potassium  Date Value Ref Range Status  04/12/2024 3.6 3.5 - 5.1 mmol/L Final         Passed - Na in normal range and within 180 days    Sodium  Date Value Ref Range Status  04/12/2024 143 135 - 145 mmol/L Final  09/28/2023 143 134 - 144 mmol/L Final         Passed - Last BP in normal range    BP Readings from Last 1 Encounters:  05/25/24 121/85           furosemide  (LASIX ) 20 MG tablet 30 tablet 0    Sig: Take 1 tablet (20 mg total) by mouth daily. Please schedule appointment with Dr. Newlin.     Cardiovascular:  Diuretics - Loop Failed - 06/29/2024  3:18 PM      Failed - Mg Level in normal range and within 180 days    Magnesium  Date Value Ref Range Status  04/06/2018 2.0 1.7 - 2.4 mg/dL Final    Comment:    Performed at [facility], [address] [place], [place] [address]         Failed - Valid encounter within last 6 months    Recent Outpatient Visits           8 months ago Degeneration of intervertebral disc of lumbar region with discogenic back pain   [place] [facility] Dept Of [place]. [facility] Delbert Clam, MD   10 months ago Other form of dyspnea   [place] [facility] [place] - [facility] Dept Of [place]. [facility] Delbert Clam, MD   1 year ago Syphilis   [place] [facility] [place] - [facility] Dept Of [place]. [facility] Delbert Clam, MD   1 year ago Chronic bilateral low back pain with bilateral sciatica  [place] [facility] [place] - A Dept Of . [facility] Delbert Clam, MD   2 years ago Respiratory complication    [facility] Celestia Rosaline SQUIBB, NP       Future Appointments             In 1 month Maccia, Melissa D, RPH-CPP [facility] at [employer] [facility] [employer]. [facility], [facility]            Passed - K in normal range and within 180 days    Potassium  Date Value Ref Range Status  04/12/2024 3.6 3.5 - 5.1 mmol/L Final         Passed - Ca in normal range and within 180 days    Calcium   Date Value Ref Range Status  04/12/2024 9.3 8.9 - 10.3 mg/dL Final   Calcium , Ion  Date Value Ref Range Status  04/04/2018 1.06 (L) 1.15 - 1.40 mmol/L Final         Passed - Na in normal range and within 180 days    Sodium  Date Value Ref Range Status  04/12/2024 143 135 - 145 mmol/L Final   09/28/2023 143 134 - 144 mmol/L Final         Passed - Cr in normal range and within 180 days    Creatinine, Ser  Date Value Ref Range Status  04/12/2024 0.65 0.44 - 1.00 mg/dL Final         Passed - Cl in normal range and within 180 days    Chloride  Date Value Ref Range Status  04/12/2024 107 98 - 111 mmol/L Final         Passed - Last BP in normal range    BP Readings from Last 1 Encounters:  05/25/24 121/85

## 2024-07-02 ENCOUNTER — Other Ambulatory Visit: Payer: Self-pay

## 2024-07-03 ENCOUNTER — Other Ambulatory Visit: Payer: Self-pay

## 2024-07-05 ENCOUNTER — Other Ambulatory Visit: Payer: Self-pay

## 2024-07-09 ENCOUNTER — Encounter: Admitting: Gastroenterology
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
## 2024-07-17 ENCOUNTER — Telehealth (INDEPENDENT_AMBULATORY_CARE_PROVIDER_SITE_OTHER): Admitting: Psychiatry

## 2024-07-17 ENCOUNTER — Other Ambulatory Visit: Payer: Self-pay

## 2024-07-17 DIAGNOSIS — F331 Major depressive disorder, recurrent, moderate: Secondary | ICD-10-CM

## 2024-07-17 DIAGNOSIS — F4310 Post-traumatic stress disorder, unspecified: Secondary | ICD-10-CM | POA: Diagnosis not present

## 2024-07-17 DIAGNOSIS — E559 Vitamin D deficiency, unspecified: Secondary | ICD-10-CM | POA: Diagnosis not present

## 2024-07-17 MED ORDER — FLUOXETINE HCL 40 MG PO CAPS
40.0000 mg | ORAL_CAPSULE | Freq: Every morning | ORAL | 0 refills | Status: AC
  Filled 2024-07-17: qty 90, 90d supply, fill #0

## 2024-07-17 MED ORDER — PRAZOSIN HCL 1 MG PO CAPS
1.0000 mg | ORAL_CAPSULE | Freq: Every day | ORAL | 0 refills | Status: AC
  Filled 2024-07-17: qty 90, 90d supply, fill #0

## 2024-07-27 ENCOUNTER — Ambulatory Visit: Admitting: Gastroenterology

## 2024-08-07 ENCOUNTER — Ambulatory Visit: Admitting: Pharmacist

## 2024-08-16 ENCOUNTER — Ambulatory Visit: Payer: Self-pay | Admitting: Family Medicine

## 2024-09-14 ENCOUNTER — Encounter (HOSPITAL_COMMUNITY): Admitting: Psychiatry
